# Patient Record
Sex: Female | Born: 1960 | Race: Black or African American | Hispanic: No | Smoking: Former smoker
Health system: Southern US, Community
[De-identification: ages and names within clinical notes are randomized; demographics above are authoritative.]

## PROBLEM LIST (undated history)

## (undated) DIAGNOSIS — I10 Essential (primary) hypertension: Secondary | ICD-10-CM

## (undated) DIAGNOSIS — E785 Hyperlipidemia, unspecified: Secondary | ICD-10-CM

## (undated) DIAGNOSIS — Z800 Family history of malignant neoplasm of digestive organs: Secondary | ICD-10-CM

## (undated) DIAGNOSIS — E119 Type 2 diabetes mellitus without complications: Secondary | ICD-10-CM

## (undated) DIAGNOSIS — R42 Dizziness and giddiness: Secondary | ICD-10-CM

## (undated) DIAGNOSIS — G971 Other reaction to spinal and lumbar puncture: Secondary | ICD-10-CM

## (undated) DIAGNOSIS — T466X5A Adverse effect of antihyperlipidemic and antiarteriosclerotic drugs, initial encounter: Secondary | ICD-10-CM

## (undated) DIAGNOSIS — E039 Hypothyroidism, unspecified: Secondary | ICD-10-CM

## (undated) DIAGNOSIS — I5030 Unspecified diastolic (congestive) heart failure: Secondary | ICD-10-CM

## (undated) DIAGNOSIS — M791 Myalgia, unspecified site: Principal | ICD-10-CM

## (undated) DIAGNOSIS — K5790 Diverticulosis of intestine, part unspecified, without perforation or abscess without bleeding: Secondary | ICD-10-CM

## (undated) DIAGNOSIS — R590 Localized enlarged lymph nodes: Secondary | ICD-10-CM

## (undated) DIAGNOSIS — Z860100 Personal history of colon polyps, unspecified: Secondary | ICD-10-CM

## (undated) DIAGNOSIS — I2510 Atherosclerotic heart disease of native coronary artery without angina pectoris: Secondary | ICD-10-CM

## (undated) DIAGNOSIS — N6310 Unspecified lump in the right breast, unspecified quadrant: Secondary | ICD-10-CM

## (undated) HISTORY — PX: CHOLECYSTECTOMY: SHX55

## (undated) HISTORY — DX: Diverticulosis of intestine, part unspecified, without perforation or abscess without bleeding: K57.90

## (undated) HISTORY — DX: Type 2 diabetes mellitus without complications: E11.9

## (undated) HISTORY — DX: Dizziness and giddiness: R42

## (undated) HISTORY — DX: Morbid (severe) obesity due to excess calories: E66.01

## (undated) HISTORY — PX: ABDOMINAL HYSTERECTOMY: SHX81

## (undated) HISTORY — DX: Adverse effect of antihyperlipidemic and antiarteriosclerotic drugs, initial encounter: T46.6X5A

## (undated) HISTORY — DX: Essential (primary) hypertension: I10

## (undated) HISTORY — DX: Personal history of colon polyps, unspecified: Z86.0100

## (undated) HISTORY — DX: Hypothyroidism, unspecified: E03.9

## (undated) HISTORY — PX: CARDIAC CATHETERIZATION: SHX172

## (undated) HISTORY — DX: Family history of malignant neoplasm of digestive organs: Z80.0

## (undated) HISTORY — DX: Myalgia, unspecified site: M79.10

## (undated) HISTORY — DX: Personal history of colonic polyps: Z86.010

## (undated) HISTORY — DX: Atherosclerotic heart disease of native coronary artery without angina pectoris: I25.10

---
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
## 2007-05-14 ENCOUNTER — Observation Stay: Payer: Self-pay | Admitting: Internal Medicine

## 2007-05-14 ENCOUNTER — Other Ambulatory Visit: Payer: Self-pay

## 2007-06-25 ENCOUNTER — Ambulatory Visit: Payer: Self-pay | Admitting: Otolaryngology

## 2010-01-09 HISTORY — PX: OTHER SURGICAL HISTORY: SHX169

## 2011-01-10 DIAGNOSIS — I214 Non-ST elevation (NSTEMI) myocardial infarction: Secondary | ICD-10-CM

## 2011-01-10 HISTORY — DX: Non-ST elevation (NSTEMI) myocardial infarction: I21.4

## 2011-01-19 ENCOUNTER — Emergency Department: Payer: Self-pay | Admitting: Emergency Medicine

## 2011-01-19 LAB — BASIC METABOLIC PANEL
Anion Gap: 9 (ref 7–16)
BUN: 11 mg/dL (ref 7–18)
Calcium, Total: 8.6 mg/dL (ref 8.5–10.1)
Chloride: 106 mmol/L (ref 98–107)
EGFR (African American): >60
EGFR (Non-African Amer.): >60
Glucose: 183 mg/dL — ABNORMAL HIGH (ref 65–99)
Osmolality: 287 (ref 275–301)
Potassium: 3.3 mmol/L — ABNORMAL LOW (ref 3.5–5.1)
Sodium: 142 mmol/L (ref 136–145)

## 2011-01-19 LAB — CBC
HCT: 42.8 % (ref 35.0–47.0)
MCHC: 33.9 g/dL (ref 32.0–36.0)
MCV: 92 fL (ref 80–100)
Platelet: 226 10*3/uL (ref 150–440)
RDW: 14 % (ref 11.5–14.5)
WBC: 6.6 10*3/uL (ref 3.6–11.0)

## 2011-01-19 LAB — TROPONIN I: Troponin-I: 0.14 ng/mL — ABNORMAL HIGH

## 2011-01-19 LAB — APTT: Activated PTT: 35.8 secs (ref 23.6–35.9)

## 2011-01-19 LAB — PROTIME-INR: INR: 1

## 2011-02-07 DIAGNOSIS — E0590 Thyrotoxicosis, unspecified without thyrotoxic crisis or storm: Secondary | ICD-10-CM | POA: Insufficient documentation

## 2011-02-13 DIAGNOSIS — E785 Hyperlipidemia, unspecified: Secondary | ICD-10-CM | POA: Insufficient documentation

## 2011-02-13 DIAGNOSIS — I4891 Unspecified atrial fibrillation: Secondary | ICD-10-CM

## 2011-02-13 DIAGNOSIS — Z87891 Personal history of nicotine dependence: Secondary | ICD-10-CM | POA: Insufficient documentation

## 2011-02-13 DIAGNOSIS — F17200 Nicotine dependence, unspecified, uncomplicated: Secondary | ICD-10-CM | POA: Insufficient documentation

## 2011-02-13 HISTORY — DX: Unspecified atrial fibrillation: I48.91

## 2011-03-15 ENCOUNTER — Encounter: Payer: Self-pay | Admitting: Internal Medicine

## 2011-04-10 ENCOUNTER — Encounter: Payer: Self-pay | Admitting: Internal Medicine

## 2011-05-10 ENCOUNTER — Encounter: Payer: Self-pay | Admitting: Internal Medicine

## 2011-08-25 DIAGNOSIS — H9190 Unspecified hearing loss, unspecified ear: Secondary | ICD-10-CM

## 2011-08-25 HISTORY — DX: Unspecified hearing loss, unspecified ear: H91.90

## 2012-12-10 ENCOUNTER — Ambulatory Visit: Payer: Self-pay | Admitting: Gastroenterology

## 2013-01-10 ENCOUNTER — Ambulatory Visit: Payer: Self-pay | Admitting: Family Medicine

## 2013-11-06 LAB — TSH: TSH: 10.8 u[IU]/mL — AB (ref 0.41–5.90)

## 2013-12-02 LAB — BASIC METABOLIC PANEL
BUN: 26 mg/dL — AB (ref 4–21)
Creatinine: 1.2 mg/dL — AB (ref 0.5–1.1)
GLUCOSE: 428 mg/dL
Sodium: 133 mmol/L — AB (ref 137–147)

## 2013-12-10 ENCOUNTER — Encounter: Payer: Self-pay | Admitting: *Deleted

## 2013-12-18 ENCOUNTER — Other Ambulatory Visit: Payer: Self-pay | Admitting: *Deleted

## 2013-12-18 ENCOUNTER — Ambulatory Visit (INDEPENDENT_AMBULATORY_CARE_PROVIDER_SITE_OTHER): Payer: BC Managed Care – PPO | Admitting: Endocrinology

## 2013-12-18 ENCOUNTER — Encounter: Payer: Self-pay | Admitting: Endocrinology

## 2013-12-18 ENCOUNTER — Encounter: Payer: Self-pay | Admitting: *Deleted

## 2013-12-18 VITALS — BP 122/68 | HR 64 | Temp 98.7°F | Resp 16 | Wt 247.1 lb

## 2013-12-18 DIAGNOSIS — E119 Type 2 diabetes mellitus without complications: Secondary | ICD-10-CM

## 2013-12-18 DIAGNOSIS — I10 Essential (primary) hypertension: Secondary | ICD-10-CM | POA: Insufficient documentation

## 2013-12-18 DIAGNOSIS — E039 Hypothyroidism, unspecified: Secondary | ICD-10-CM

## 2013-12-18 DIAGNOSIS — E669 Obesity, unspecified: Secondary | ICD-10-CM

## 2013-12-18 DIAGNOSIS — E1169 Type 2 diabetes mellitus with other specified complication: Secondary | ICD-10-CM | POA: Insufficient documentation

## 2013-12-18 DIAGNOSIS — E785 Hyperlipidemia, unspecified: Secondary | ICD-10-CM

## 2013-12-18 LAB — LIPID PANEL
CHOL/HDL RATIO: 5
Cholesterol: 167 mg/dL (ref 0–200)
HDL: 31.6 mg/dL — ABNORMAL LOW (ref >39.00)
LDL CALC: 116 mg/dL — AB (ref 0–99)
NonHDL: 135.4
Triglycerides: 97 mg/dL (ref 0.0–149.0)
VLDL: 19.4 mg/dL (ref 0.0–40.0)

## 2013-12-18 LAB — T4, FREE: FREE T4: 1.04 ng/dL (ref 0.60–1.60)

## 2013-12-18 LAB — COMPREHENSIVE METABOLIC PANEL
ALBUMIN: 3.7 g/dL (ref 3.5–5.2)
ALK PHOS: 72 U/L (ref 39–117)
ALT: 22 U/L (ref 0–35)
AST: 33 U/L (ref 0–37)
BILIRUBIN TOTAL: 0.6 mg/dL (ref 0.2–1.2)
BUN: 9 mg/dL (ref 6–23)
CO2: 23 mEq/L (ref 19–32)
Calcium: 8.6 mg/dL (ref 8.4–10.5)
Chloride: 100 mEq/L (ref 96–112)
Creatinine, Ser: 0.8 mg/dL (ref 0.4–1.2)
GFR: 92.47 mL/min (ref >60.00)
GLUCOSE: 186 mg/dL — AB (ref 70–99)
Potassium: 3.2 mEq/L — ABNORMAL LOW (ref 3.5–5.1)
Sodium: 132 mEq/L — ABNORMAL LOW (ref 135–145)
Total Protein: 6.9 g/dL (ref 6.0–8.3)

## 2013-12-18 LAB — HM DIABETES FOOT EXAM: HM Diabetic Foot Exam: NORMAL

## 2013-12-18 LAB — TSH: TSH: 3.6 u[IU]/mL (ref 0.35–4.50)

## 2013-12-18 LAB — MICROALBUMIN / CREATININE URINE RATIO
Creatinine,U: 109.3 mg/dL
MICROALB UR: 0.3 mg/dL (ref 0.0–1.9)
Microalb Creat Ratio: 0.3 mg/g (ref 0.0–30.0)

## 2013-12-18 MED ORDER — INSULIN LISPRO 100 UNIT/ML (KWIKPEN): PEN_INJECTOR | SUBCUTANEOUS | Status: DC

## 2013-12-18 MED ORDER — INSULIN PEN NEEDLE 32G X 4 MM MISC: Status: DC

## 2013-12-18 MED ORDER — INSULIN GLARGINE 300 UNIT/ML ~~LOC~~ SOPN: 14.0000 [IU] | PEN_INJECTOR | Freq: Every day | SUBCUTANEOUS | Status: DC

## 2013-12-18 NOTE — Progress Notes (Signed)
Reason for visit-  Elizabeth Russo is a 53 y.o.-year-old female, referred by her PCP,  Dr Enid Derry, for management of Type 2 diabetes, uncontrolled, with complications (? Stage 3 CKD versus AKI secondary to hyperglycemia). associated hx CAD s/p MI .   HPI- Patient has been diagnosed with diabetes in November 2015 after having preceding IFG. She has been on dietary modifications till recently. Sister passed away October 23, 2013 and this has been stressful. Has preceding hx steroid injection in shoulder around August 2015. Was tried briefly on oral agent for about 4 days recently in November, but given elevated sugars in setting of ketonuria was transitioned to basal insulin about 2 weeks ago and also started on bolus insulin 1 week ago.     Pt is currently on a regimen of: - Toujeo 10 units [place] daily ( dose decreased from 35 units daily to 20 then 10 units yesterday) - Humalog approximately 4+1 scale  For FS> 200 with BF and supper    Last hemoglobin A1c was: No results found for: HGBA1C By report from clinic notes was 12.8 % Nov 2015  Pt checks her sugars 2-3 a day . Uses  One Touch ultra 2 glucometer. By sugar log they are:  PREMEAL Breakfast Lunch Dinner Bedtime Overall  Glucose range: 180-281  140-283    Mean/median:        POST-MEAL PC Breakfast PC Lunch PC Dinner  Glucose range:     Mean/median:       Hypoglycemia-  No lows. Lowest sugar was n/a;   Dietary habits- eats 2- three times daily. Tries to limit carbs, sweetened beverages, sodas, desserts. Increased vegetable intake recently. Cancelled DM education appointment made for her by PCP at [facility] due to unclear reason.  Exercise- no formal exercise Weight -  Wt Readings from Last 3 Encounters:  12/18/13 247 lb 1.9 oz (112.093 kg)    Diabetes Complications-  Nephropathy- ? Yes Stage 3  CKD versus dehydration in setting of uncontrolled sugars, last BUN/creatinine- GFR 50-60  Lab Results  Component Value Date   BUN 9  12/18/2013   CREATININE 0.8 12/18/2013    Retinopathy- No, Last DEE was in 2014 Neuropathy- no numbness and tingling in her feet. No known neuropathy.  Associated history -Has CAD s/p MI and stent 2013 . No prior stroke. Has hypothyroidism. her last TSH was  Lab Results  Component Value Date   TSH 3.60 12/18/2013    Taking levothyroxine 112 mcg daily. Last TSH not controlled. FH of hypothyroidism in mother. ROS as below.   Hyperlipidemia-  her last set of lipids were- Currently on dietary therapy. Tolerating well. Recalls not tolerating Crestor in 2015 due to muscle aches.  Lab Results  Component Value Date   CHOL 167 12/18/2013   HDL 31.60* 12/18/2013   LDLCALC 116* 12/18/2013   TRIG 97.0 12/18/2013   CHOLHDL 5 12/18/2013    Blood Pressure/HTN- Patient's blood pressure is well controlled today on current regimen that includes ACE-I.  Pt has FH of DM in parents and sister.  I have reviewed the patient's past medical history, family and social history, surgical history, medications and allergies.  Past Medical History  Diagnosis Date  . Diabetes mellitus without complication   . Hypertension   . Hypothyroid   . Diverticulosis   . CAD (coronary artery disease)    Past Surgical History  Procedure Laterality Date  . Abdominal hysterectomy     Family History  Problem Relation Age of  Onset  . Heart disease Mother   . Diabetes Mother   . Hypertension Mother   . Hyperlipidemia Mother   . Thyroid disease Mother   . Cancer Father     colon  . Diabetes Father   . Hypertension Father   . Diabetes Sister   . Hypertension Sister   . Heart disease Sister   . Hyperlipidemia Sister   . Kidney disease Sister   . Cancer Maternal Grandfather     lung   History   Social History  . Marital Status: Married    Spouse Name: N/A    Number of Children: N/A  . Years of Education: N/A   Occupational History  . Not on file.   Social History Main Topics  . Smoking status:  Former Research scientist (life sciences)  . Smokeless tobacco: Not on file  . Alcohol Use: Not on file  . Drug Use: Not on file  . Sexual Activity: Not on file   Other Topics Concern  . Not on file   Social History Narrative   Current Outpatient Prescriptions on File Prior to Visit  Medication Sig Dispense Refill  . amLODipine (NORVASC) 5 MG tablet Take 5 mg by mouth daily.    . carvedilol (COREG) 12.5 MG tablet Take 12.5 mg by mouth 2 (two) times daily with a meal.    . clopidogrel (PLAVIX) 75 MG tablet Take 75 mg by mouth daily.    . hydrochlorothiazide (HYDRODIURIL) 25 MG tablet Take 25 mg by mouth daily.    Marland Kitchen levothyroxine (SYNTHROID, LEVOTHROID) 112 MCG tablet Take 112 mcg by mouth daily before breakfast.    . lisinopril (PRINIVIL,ZESTRIL) 40 MG tablet Take 40 mg by mouth 2 (two) times daily.     No current facility-administered medications on file prior to visit.   Allergies no known allergies   Review of Systems: [x]  complains of  [  ] denies General:   [  ] Recent weight change [ x ] Fatigue  [ x ] Loss of appetite Eyes: [ x ]  Vision Difficulty [  ]  Eye pain ENT: [x  ]  Hearing difficulty [  ]  Difficulty Swallowing CVS: [  ] Chest pain [ x ]  Palpitations/Irregular Heart beat [  ]  Shortness of breath lying flat [  ] Swelling of legs Resp: [  ] Frequent Cough [ x ] Shortness of Breath  [  ]  Wheezing GI: [  ] Heartburn  [  ] Nausea or Vomiting  [  ] Diarrhea [  ] Constipation  [  ] Abdominal Pain GU: [  ]  Polyuria  [  ]  nocturia Bones/joints:  [  ]  Muscle aches  [  ] Joint Pain  [  ] Bone pain Skin/Hair/Nails: [ x ]  Rash  [  ] New stretch marks [ x ]  Itching [  ] Hair loss [  ]  Excessive hair growth Reproduction: [  ] Low sexual desire , [  ]  Women: Menstrual cycle problems [  ]  Women: Breast Discharge [  ] Men: Difficulty with erections [  ]  Men: Enlarged Breasts CNS: [  ] Frequent Headaches [ x ] Blurry vision [  ] Tremors [  ] Seizures [  ] Loss of consciousness [  ] Localized  weakness Endocrine: [x  ]  Excess thirst [  ]  Feeling excessively hot [  ]  Feeling excessively cold Heme: [  ]  Easy bruising [  ]  Enlarged glands or lumps in neck Allergy: [  ]  Food allergies [  ] Environmental allergies  PE: BP 122/68 mmHg  Pulse 64  Temp(Src) 98.7 F (37.1 C) (Oral)  Resp 16  Wt 247 lb 1.9 oz (112.093 kg)  SpO2 96%  LMP  (LMP Unknown) Wt Readings from Last 3 Encounters:  12/18/13 247 lb 1.9 oz (112.093 kg)   GENERAL: No acute distress, well developed HEENT:  Eye exam shows normal external appearance. Oral exam shows normal mucosa .  NECK:   Neck exam shows no lymphadenopathy. No Carotids bruits. Thyroid is  enlarged and nodularity is felt bilaterally.  + acanthosis nigricans LUNGS:         Chest is symmetrical. Lungs are clear to auscultation.Marland Kitchen   HEART:         Heart sounds:  S1 and S2 are normal. No murmurs or clicks heard. ABDOMEN:  No Distention present. Liver and spleen are not palpable. No other mass or tenderness present.  EXTREMITIES:     There is no edema. 2+ DP pulses  NEUROLOGICAL:     Grossly intact.            Diabetic foot exam done with shoes and socks removed: Normal Monofilament testing bilaterally. No deformities of toes.  Nails  Not dystrophic. Skin normal color. No open wounds. Dry skin. Calluses+ MUSCULOSKELETAL:       There is no enlargement or gross deformity of the joints.  SKIN:       No rash  ASSESSMENT AND PLAN: Problem List Items Addressed This Visit      Cardiovascular and Mediastinum   Essential hypertension, benign    BP at target. Update urine MA.     Relevant Orders      Comprehensive metabolic panel (Completed)      Microalbumin / creatinine urine ratio (Completed)     Endocrine   Type 2 diabetes mellitus not at goal - Primary    Discussed that her A1c is very elevated, along with her sugars.   Discussed diet and exercise. She is willing to continue with her dietary efforts and also will call to set up DM education  appointment at the start of next year.   Discussed home glucose monitoring and she will start checking sugars 4x daily.   Discussed need for basal/bolus insulin and will continue her on Toujeo and Humalog for now.   Change Toujeo to 14 units once daily.  Change Humalog to 6+1 scale qac. She will use half dose scale for lunch time, provided she is eating.  Discussed foot care, eye exams, hypoglycemia recognition and treatment.   RTC 1 week. Update labs today.    Relevant Orders      Comprehensive metabolic panel (Completed)      Microalbumin / creatinine urine ratio (Completed)      Lipid panel (Completed)   Hypothyroidism    Clinically euthyroid today. Update thyroid tests. She has a palpable goiter with nodules possibly. Denies compressive symptoms. Will readdress at next visits and order thyroid ultrasound at that time.     Relevant Orders      TSH (Completed)      T4, free (Completed)     Other   Hyperlipidemia    Update liver and lipid panel for screening. Will assess statin use at next visits.          - Return to clinic in 1 week with sugar log/meter.  Marishka Rentfrow  [facility] 12/19/2013 12:23 PM

## 2013-12-18 NOTE — Patient Instructions (Addendum)
Check sugars 4 x daily ( before each meal and at bedtime).  Record them in a log book and bring that/meter to next appointment.   Change Toujeo to 14 units daily at bedtime.  Change Humalog scale as follows-   Blood Glucose (mg/dL)  Breakfast  (Units Humalog Insulin)  Lunch  (Units Humalog Insulin)  Supper  (Units Humalog Insulin)  Nighttime (Units Humalog Insulin)   <70 Treat the low blood sugar.  Recheck blood glucose  in 15 mins. If sugar is more than 70, then take the number of units of insulin in the 70-90 row, if before a meal.   70-90   4   4   4   0  91-130 (Base Dose)  6  6  6   0   131-150  7  7  7   0   151-200  8  8  8   0   201-250  9  9  9  0   251-300  10  10  10 1    301-350  11  11  11 2    351-400  12  12  12  3    401-450  13  13  13 4    >450  14  14  14  5    Toujeo insulin 14 units subcutaneous Nightly     Please come back for a follow-up appointment in 1 weeks [place] half dose scale for lunch provided you are eating a meal.  Labs today. Call back if start to notice lo blood sugars.

## 2013-12-18 NOTE — Progress Notes (Signed)
Pre visit review using our clinic review tool, if applicable. No additional management support is needed unless otherwise documented below in the visit note. 

## 2013-12-19 DIAGNOSIS — E785 Hyperlipidemia, unspecified: Secondary | ICD-10-CM | POA: Insufficient documentation

## 2013-12-19 DIAGNOSIS — I2510 Atherosclerotic heart disease of native coronary artery without angina pectoris: Secondary | ICD-10-CM | POA: Insufficient documentation

## 2013-12-19 NOTE — Assessment & Plan Note (Signed)
Clinically euthyroid today. Update thyroid tests. She has a palpable goiter with nodules possibly. Denies compressive symptoms. Will readdress at next visits and order thyroid ultrasound at that time.

## 2013-12-19 NOTE — Assessment & Plan Note (Signed)
BP at target. Update urine MA.

## 2013-12-19 NOTE — Assessment & Plan Note (Signed)
Discussed that her A1c is very elevated, along with her sugars.   Discussed diet and exercise. She is willing to continue with her dietary efforts and also will call to set up DM education appointment at the start of next year.   Discussed home glucose monitoring and she will start checking sugars 4x daily.   Discussed need for basal/bolus insulin and will continue her on Toujeo and Humalog for now.   Change Toujeo to 14 units once daily.  Change Humalog to 6+1 scale qac. She will use half dose scale for lunch time, provided she is eating.  Discussed foot care, eye exams, hypoglycemia recognition and treatment.   RTC 1 week. Update labs today.

## 2013-12-19 NOTE — Assessment & Plan Note (Signed)
Update liver and lipid panel for screening. Will assess statin use at next visits.

## 2013-12-24 ENCOUNTER — Ambulatory Visit: Payer: BC Managed Care – PPO | Admitting: Endocrinology

## 2013-12-30 ENCOUNTER — Telehealth: Payer: Self-pay

## 2013-12-30 NOTE — Telephone Encounter (Signed)
Received a Prior authorization request for Humalog Pen. Pharmacist at [facility] called today and asked if the PA is denied would it be okay to change isulin to Novalog? Pharmacist knows that insurance will cover Novalog. Please advise.

## 2013-12-31 NOTE — Telephone Encounter (Signed)
Pharmacist was notified that it is ok to D/C humalog and start Novolog at this time.

## 2013-12-31 NOTE — Telephone Encounter (Signed)
Pharmacist at [facility] called today and asked if the prior authorization is denied would it be okay to change insulin to Novalog? Pharmacist knows that insurance will cover Novalog. Please advise as Dr. Howell Rucks is out of the office.

## 2013-12-31 NOTE — Telephone Encounter (Signed)
Humalog can be changed to NovoLog

## 2014-03-19 ENCOUNTER — Ambulatory Visit (INDEPENDENT_AMBULATORY_CARE_PROVIDER_SITE_OTHER): Payer: BC Managed Care – PPO | Admitting: Endocrinology

## 2014-03-19 VITALS — BP 126/80 | HR 57 | Temp 98.2°F | Resp 16 | Ht 64.0 in | Wt 245.8 lb

## 2014-03-19 DIAGNOSIS — E049 Nontoxic goiter, unspecified: Secondary | ICD-10-CM

## 2014-03-19 DIAGNOSIS — I10 Essential (primary) hypertension: Secondary | ICD-10-CM

## 2014-03-19 DIAGNOSIS — E785 Hyperlipidemia, unspecified: Secondary | ICD-10-CM

## 2014-03-19 DIAGNOSIS — E039 Hypothyroidism, unspecified: Secondary | ICD-10-CM

## 2014-03-19 DIAGNOSIS — E119 Type 2 diabetes mellitus without complications: Secondary | ICD-10-CM

## 2014-03-19 HISTORY — DX: Nontoxic goiter, unspecified: E04.9

## 2014-03-19 NOTE — Assessment & Plan Note (Signed)
Recent LDL not at goal. Encourage lifestyle for now, and readdress statin use at next visits

## 2014-03-19 NOTE — Patient Instructions (Addendum)
Check sugars 3 times daily ( fasting and premeal readings at alternating times, or at bedtime).  Record them in a sugar log and bring log and meter to next appointment.   Change Toujeo to 16 units daily.  Continue current Novolog dosing.  Please try to eat lunch daily instead of snacking through the day. Take full dose Novolog if eating a meal at lunch time. If snacking at lunch time, then take half dose of Novolog scale.   Please come back for a follow-up appointment in 4 weeks

## 2014-03-19 NOTE — Progress Notes (Signed)
Reason for visit-  Elizabeth Russo is a 54 y.o.-year-old female,  for management of Type 2 diabetes, uncontrolled, without complications . associated hx CAD s/p MI . Last visit Dec 2015.   HPI- Patient has been diagnosed with diabetes in November 2015 after having preceding IFG. She has been on dietary modifications till recently. Sister passed away 2013/11/20 and this has been stressful. Has preceding hx steroid injection in shoulder around August 2015. Was tried briefly on oral agent for about 4 days recently in November, but given elevated sugars in setting of ketonuria was transitioned to basal insulin Dec 2015  and also started on bolus insulin 1 week after.   *had to reschedule last appointment due to husband's passing   Pt is currently on a regimen of: - Toujeo 14 units [place] daily  - Novolog at 6+1 scale  with BF and supper , supposed to take half dose for lunch but not doing the injection at lunch time   Last hemoglobin A1c was: No results found for: HGBA1C By report from clinic notes was 12.8 % Nov 2015  Pt checks her sugars 2 a day . Uses  One Touch ultra 2 glucometer. By sugar log they are:  PREMEAL Breakfast Lunch Dinner Bedtime Overall  Glucose range: 115-126 131 76-178 mostly    Mean/median:        POST-MEAL PC Breakfast PC Lunch PC Dinner  Glucose range:     Mean/median:       Hypoglycemia-  No lows. Lowest sugar was n/a;   Dietary habits- eats 2- three times daily. Tries to limit carbs, sweetened beverages, sodas, desserts. Increased vegetable intake recently. Cancelled DM education appointment made for her by PCP at [facility] due to unclear reason- feels that she knows what to do.  Exercise- trying to walk -every other day- 3mile Weight -  Wt Readings from Last 3 Encounters:  03/19/14 245 lb 12.8 oz (111.494 kg)  12/18/13 247 lb 1.9 oz (112.093 kg)    Diabetes Complications-  Nephropathy- No CKD, last BUN/creatinine- GFR 50-60 previously in setting of high  sugars Lab Results  Component Value Date   BUN 9 12/18/2013   CREATININE 0.8 12/18/2013   Lab Results  Component Value Date   GFR 92.47 12/18/2013   Lab Results  Component Value Date   MICRALBCREAT 0.3 12/18/2013    Retinopathy- No, Last DEE was in 2014 Neuropathy- no numbness and tingling in her feet. No known neuropathy.  Associated history -Has CAD s/p MI and stent 2013 . No prior stroke. Has hypothyroidism. her last TSH was  Lab Results  Component Value Date   TSH 3.60 12/18/2013    Taking levothyroxine 112 mcg daily. Last TSH not controlled. FH of hypothyroidism in mother. ROS as below. No FH thyroid cancer. Denies any compressive symptoms.  Hyperlipidemia-  her last set of lipids were- Currently on dietary therapy. Tolerating well. Recalls not tolerating Crestor in 2015 due to muscle aches.  Lab Results  Component Value Date   CHOL 167 12/18/2013   HDL 31.60* 12/18/2013   LDLCALC 116* 12/18/2013   TRIG 97.0 12/18/2013   CHOLHDL 5 12/18/2013    Blood Pressure/HTN- Patient's blood pressure is well controlled today on current regimen that includes ACE-I.   I have reviewed the patient's past medical history,  medications and allergies.   Current Outpatient Prescriptions on File Prior to Visit  Medication Sig Dispense Refill  . amLODipine (NORVASC) 5 MG tablet Take 5 mg by mouth  daily.    . carvedilol (COREG) 12.5 MG tablet Take 12.5 mg by mouth 2 (two) times daily with a meal.    . clopidogrel (PLAVIX) 75 MG tablet Take 75 mg by mouth daily.    . hydrochlorothiazide (HYDRODIURIL) 25 MG tablet Take 25 mg by mouth daily.    . Insulin Glargine (TOUJEO SOLOSTAR) 300 UNIT/ML SOPN Inject 14 Units into the skin daily. 3 pen 3  . insulin lispro (HUMALOG KWIKPEN) 100 UNIT/ML KiwkPen Use on a sliding scale between 6-16 units [place] three times daily. Max daily dose 45 units. 15 mL 3  . Insulin Pen Needle (BD PEN NEEDLE NANO U/F) 32G X 4 MM MISC Use for insulin administration four  times daily. 120 each 11  . levothyroxine (SYNTHROID, LEVOTHROID) 112 MCG tablet Take 112 mcg by mouth daily before breakfast.    . lisinopril (PRINIVIL,ZESTRIL) 40 MG tablet Take 40 mg by mouth 2 (two) times daily.     No current facility-administered medications on file prior to visit.   Allergies no known allergies  Review of Systems- [ x ]  Complains of    [  ]  denies [  ] Recent weight change [  ]  Fatigue [  ] polydipsia [  ] polyuria [  ]  nocturia [  ]  vision difficulty [  ] chest pain [  ] shortness of breath [  ] leg swelling [  ] cough [  ] nausea/vomiting [  ] diarrhea [  ] constipation [  ] abdominal pain [  ]  tingling/numbness in extremities [  ]  concern with feet ( wounds/sores)   PE: BP 126/80 mmHg  Pulse 57  Temp(Src) 98.2 F (36.8 C) (Oral)  Resp 16  Ht 5\' 4"  (1.626 m)  Wt 245 lb 12.8 oz (111.494 kg)  BMI 42.17 kg/m2  SpO2 96%  LMP  (LMP Unknown) Wt Readings from Last 3 Encounters:  03/19/14 245 lb 12.8 oz (111.494 kg)  12/18/13 247 lb 1.9 oz (112.093 kg)   GENERAL: No acute distress, well developed HEENT:  Eye exam shows normal external appearance. Oral exam shows normal mucosa .  NECK:   Neck exam shows no lymphadenopathy. Thyroid is  enlarged and nodularity is felt bilaterally.     ASSESSMENT AND PLAN: Problem List Items Addressed This Visit      Cardiovascular and Mediastinum   Essential hypertension, benign    BP at goal today. Recent urine MA normal Dec 2015.         Endocrine   Type 2 diabetes mellitus not at goal - Primary    Recent A1c elevated, but sugars are getting better on current regimen.  She is yet not wanting to set up DM education appt. Going to try manage this on her own.   Continue checking sugars 3x daily.   Change Toujeo to 16 units daily.  Continue Novolog at 6+1 scale qac. She will use half dose scale for lunch time, provided she is eating a snack otherwise is going to work on using the full scale at lunch time if she  has a meal.  Reminded her to get eye appointment.  At next visits, she would like to try coming off the insulin and this might be possible, if her sugars are still at target.   RTC 1 month        Hypothyroidism    Clinically euthyroid today. Recent thyroid levels normal. She has a palpable goiter with nodules  possibly. Denies compressive symptoms. Discussed about thyroid nodules, possible pathology and follow up. Order thyroid ultrasound at this time.         Relevant Orders   US Soft Tissue Head/Neck   Goiter    Palpable goiter with some nodularity. Discussed thyroid nodules and possible pathology, follow up. Ordered thyroid US for further evaluation.       Relevant Orders   US Soft Tissue Head/Neck     Other   Hyperlipidemia    Recent LDL not at goal. Encourage lifestyle for now, and readdress statin use at next visits           - Return to clinic in 1 month with sugar log/meter.  Elizabeth Russo [facility] 03/19/2014 8:53 AM

## 2014-03-19 NOTE — Assessment & Plan Note (Signed)
Palpable goiter with some nodularity. Discussed thyroid nodules and possible pathology, follow up. Ordered thyroid US for further evaluation.

## 2014-03-19 NOTE — Assessment & Plan Note (Signed)
Recent A1c elevated, but sugars are getting better on current regimen.  She is yet not wanting to set up DM education appt. Going to try manage this on her own.   Continue checking sugars 3x daily.   Change Toujeo to 16 units daily.  Continue Novolog at 6+1 scale qac. She will use half dose scale for lunch time, provided she is eating a snack otherwise is going to work on using the full scale at lunch time if she has a meal.  Reminded her to get eye appointment.  At next visits, she would like to try coming off the insulin and this might be possible, if her sugars are still at target.   RTC 1 month

## 2014-03-19 NOTE — Assessment & Plan Note (Signed)
Clinically euthyroid today. Recent thyroid levels normal. She has a palpable goiter with nodules possibly. Denies compressive symptoms. Discussed about thyroid nodules, possible pathology and follow up. Order thyroid ultrasound at this time.

## 2014-03-19 NOTE — Assessment & Plan Note (Signed)
BP at goal today. Recent urine MA normal Dec 2015.

## 2014-03-19 NOTE — Progress Notes (Signed)
Pre visit review using our clinic review tool, if applicable. No additional management support is needed unless otherwise documented below in the visit note. 

## 2014-03-31 ENCOUNTER — Other Ambulatory Visit: Payer: BC Managed Care – PPO

## 2014-04-06 ENCOUNTER — Encounter: Payer: Self-pay | Admitting: *Deleted

## 2014-04-16 ENCOUNTER — Ambulatory Visit: Payer: BC Managed Care – PPO | Admitting: Endocrinology

## 2014-04-16 DIAGNOSIS — Z0289 Encounter for other administrative examinations: Secondary | ICD-10-CM

## 2014-04-22 ENCOUNTER — Ambulatory Visit (INDEPENDENT_AMBULATORY_CARE_PROVIDER_SITE_OTHER): Payer: BC Managed Care – PPO | Admitting: General Surgery

## 2014-04-22 ENCOUNTER — Encounter: Payer: Self-pay | Admitting: General Surgery

## 2014-04-22 VITALS — BP 142/70 | HR 72 | Resp 14 | Ht 64.0 in | Wt 244.0 lb

## 2014-04-22 DIAGNOSIS — N63 Unspecified lump in breast: Secondary | ICD-10-CM

## 2014-04-22 DIAGNOSIS — N6310 Unspecified lump in the right breast, unspecified quadrant: Secondary | ICD-10-CM

## 2014-04-22 NOTE — Patient Instructions (Signed)
1 week nurse  

## 2014-04-22 NOTE — Progress Notes (Signed)
Patient ID: [name], female   DOB: [birth year], 54 y.o.   MRN: [id number]  Chief Complaint  Patient presents with  . Other    right niplle redness    HPI Elizabeth Russo is a 54 y.o. female here today for a evaluation from her right nipple. Patient states her right nipple will intermittently become red and swollen . Patient states it been going on for about three years now. She states that the flare up has only happened about 2 to 3 times. When the area is inflared she states it only last for about a week. She states she also has a knot on right nipple the size on a pea.    There is no history of nipple/oral contact.  Mammogram was done on 04/09/14 at [facility].   The patient is accompanied by her daughter, Elizabeth Russo, who was present for the interview and exam. HPI  Past Medical History  Diagnosis Date  . Diabetes mellitus without complication   . Hypertension   . Hypothyroid   . Diverticulosis   . CAD (coronary artery disease)   . Vertigo     Past Surgical History  Procedure Laterality Date  . Abdominal hysterectomy    . Heart stent  2012    Family History  Problem Relation Age of Onset  . Heart disease Mother   . Diabetes Mother   . Hypertension Mother   . Hyperlipidemia Mother   . Thyroid disease Mother   . Cancer Father     colon  . Diabetes Father   . Hypertension Father   . Diabetes Sister   . Hypertension Sister   . Heart disease Sister   . Hyperlipidemia Sister   . Kidney disease Sister   . Cancer Maternal Grandfather     lung    Social History History  Substance Use Topics  . Smoking status: Former Research scientist (life sciences)  . Smokeless tobacco: Not on file  . Alcohol Use: 0.0 oz/week    0 Standard drinks or equivalent per week    Allergies no known allergies  Current Outpatient Prescriptions  Medication Sig Dispense Refill  . amLODipine (NORVASC) 5 MG tablet Take 5 mg by mouth daily.    Marland Kitchen aspirin EC 81 MG tablet Take by mouth.    Marland Kitchen atorvastatin (LIPITOR)  10 MG tablet Take 10 mg by mouth daily at 6 PM.    . clopidogrel (PLAVIX) 75 MG tablet Take 75 mg by mouth daily.    . hydrochlorothiazide (HYDRODIURIL) 25 MG tablet Take 25 mg by mouth daily.    . Insulin Glargine (TOUJEO SOLOSTAR) 300 UNIT/ML SOPN Inject 14 Units into the skin daily. 3 pen 3  . Insulin Pen Needle (BD PEN NEEDLE NANO U/F) 32G X 4 MM MISC Use for insulin administration four times daily. 120 each 11  . levothyroxine (SYNTHROID, LEVOTHROID) 112 MCG tablet Take 112 mcg by mouth daily before breakfast.    . lisinopril (PRINIVIL,ZESTRIL) 40 MG tablet Take 40 mg by mouth 2 (two) times daily.    Marland Kitchen NOVOLOG FLEXPEN 100 UNIT/ML FlexPen     . ONE TOUCH ULTRA TEST test strip     . insulin lispro (HUMALOG KWIKPEN) 100 UNIT/ML KiwkPen Use on a sliding scale between 6-16 units [place] three times daily. Max daily dose 45 units. (Patient not taking: Reported on 04/22/2014) 15 mL 3   No current facility-administered medications for this visit.    Review of Systems Review of Systems  Constitutional: Negative.  Respiratory: Negative.   Cardiovascular: Negative.     Blood pressure 142/70, pulse 72, resp. rate 14, height 5\' 4"  (1.626 m), weight 244 lb (110.678 kg).  Physical Exam Physical Exam  Constitutional: She is oriented to person, place, and time. She appears well-developed and well-nourished.  Eyes: Conjunctivae are normal. No scleral icterus.  Neck: Neck supple.  Cardiovascular: Normal rate, regular rhythm and normal heart sounds.   Pulmonary/Chest: Effort normal and breath sounds normal. Deformity:  3 mm nodular at 5 o'clock 1 cm from the base of the nipple. Right breast exhibits mass. Right breast exhibits no inverted nipple, no nipple discharge, no skin change and no tenderness. Left breast exhibits no inverted nipple, no mass, no nipple discharge, no skin change and no tenderness.  Lymphadenopathy:    She has no cervical adenopathy.  Neurological: She is alert and oriented to  person, place, and time.  Skin: Skin is warm and dry.    Data Reviewed Bilateral mammograms dated 04/08/2014 completed at [facility] were independently reviewed. BI-RADS-1.  Assessment    Rumel nodule within the right areolar skin.  No evidence of primary pathology involving the right nipple.    Plan    It was elected to remove the nodular area. This was completed using 5 mL of 0.5% Xylocaine with 0.25% Marcaine with 1-200,000 units epinephrine. Graft the nodular area was excised and sent for routine histology. This was about 3 mm in diameter. The skin defect was closed with interrupted 5-0 nylon sutures. A dry dressing with Telfa and Tegaderm was applied.  Postoperative wound care was reviewed. The patient will return in one week for suture removal.  Should her nipple redness recur, she was encouraged to call promptly for early assessment to help better determine the cause.     PCP:  Fleet Contras 04/25/2014, 10:56 AM

## 2014-04-25 DIAGNOSIS — N6310 Unspecified lump in the right breast, unspecified quadrant: Secondary | ICD-10-CM | POA: Insufficient documentation

## 2014-04-25 HISTORY — DX: Unspecified lump in the right breast, unspecified quadrant: N63.10

## 2014-04-27 ENCOUNTER — Other Ambulatory Visit: Payer: Self-pay | Admitting: *Deleted

## 2014-04-27 ENCOUNTER — Telehealth: Payer: Self-pay

## 2014-04-27 MED ORDER — ONETOUCH ULTRA BLUE VI STRP: ORAL_STRIP | Status: DC

## 2014-04-27 NOTE — Telephone Encounter (Signed)
Notified patient as instructed, patient pleased. Discussed follow-up appointments, patient agrees  

## 2014-04-27 NOTE — Telephone Encounter (Signed)
Pt called, needing refill on test strips to [facility]. Rx sent to pharmacy by escript

## 2014-04-27 NOTE — Telephone Encounter (Signed)
-----   Message from Robert Bellow, MD sent at 04/27/2014  2:35 PM EDT ----- Please notify the patient the biopsy results were entirely benign. Office follow-up for wound check as previously scheduled. Remind her to call promptly if she develops another episode of redness in the nipple. We'll try to arrange for a prompt exam.  ----- Message -----    From: [facility]    Sent: 04/24/2014  10:53 AM      To: Robert Bellow, MD

## 2014-04-29 ENCOUNTER — Ambulatory Visit (INDEPENDENT_AMBULATORY_CARE_PROVIDER_SITE_OTHER): Payer: BC Managed Care – PPO

## 2014-04-29 DIAGNOSIS — N63 Unspecified lump in breast: Secondary | ICD-10-CM

## 2014-04-29 DIAGNOSIS — N6310 Unspecified lump in the right breast, unspecified quadrant: Secondary | ICD-10-CM

## 2014-04-29 NOTE — Patient Instructions (Signed)
Follow up as needed

## 2014-04-29 NOTE — Progress Notes (Signed)
Patient ID: Elizabeth Russo, female   DOB: 1960/12/21, 54 y.o.   MRN: [id number]  Patient came in today for a wound check. The wound is clean, with no signs of infection noted. Two sutures removed, skin intact. Patient aware of her pathology result. Follow up as needed.

## 2014-05-12 ENCOUNTER — Ambulatory Visit
Admission: RE | Admit: 2014-05-12 | Discharge: 2014-05-12 | Disposition: A | Discharge status: Home / Self Care | Payer: BC Managed Care – PPO | Source: Ambulatory Visit | Attending: Endocrinology | Admitting: Endocrinology

## 2014-05-12 DIAGNOSIS — E049 Nontoxic goiter, unspecified: Secondary | ICD-10-CM

## 2014-05-12 DIAGNOSIS — E042 Nontoxic multinodular goiter: Secondary | ICD-10-CM | POA: Diagnosis not present

## 2014-05-12 DIAGNOSIS — E039 Hypothyroidism, unspecified: Secondary | ICD-10-CM

## 2014-05-24 DIAGNOSIS — I5042 Chronic combined systolic (congestive) and diastolic (congestive) heart failure: Secondary | ICD-10-CM | POA: Insufficient documentation

## 2014-06-09 ENCOUNTER — Encounter: Payer: Self-pay | Admitting: General Surgery

## 2014-06-26 ENCOUNTER — Ambulatory Visit (INDEPENDENT_AMBULATORY_CARE_PROVIDER_SITE_OTHER): Payer: BC Managed Care – PPO | Admitting: Family Medicine

## 2014-06-26 ENCOUNTER — Encounter: Payer: Self-pay | Admitting: Family Medicine

## 2014-06-26 VITALS — BP 115/77 | HR 58 | Temp 98.6°F | Ht 64.5 in | Wt 245.0 lb

## 2014-06-26 DIAGNOSIS — E785 Hyperlipidemia, unspecified: Secondary | ICD-10-CM

## 2014-06-26 DIAGNOSIS — M109 Gout, unspecified: Secondary | ICD-10-CM

## 2014-06-26 DIAGNOSIS — E119 Type 2 diabetes mellitus without complications: Secondary | ICD-10-CM

## 2014-06-26 DIAGNOSIS — M10071 Idiopathic gout, right ankle and foot: Secondary | ICD-10-CM | POA: Diagnosis not present

## 2014-06-26 DIAGNOSIS — E039 Hypothyroidism, unspecified: Secondary | ICD-10-CM | POA: Diagnosis not present

## 2014-06-26 DIAGNOSIS — I10 Essential (primary) hypertension: Secondary | ICD-10-CM

## 2014-06-26 HISTORY — DX: Morbid (severe) obesity due to excess calories: E66.01

## 2014-06-26 MED ORDER — INDOMETHACIN 50 MG PO CAPS: 50.0000 mg | ORAL_CAPSULE | Freq: Three times a day (TID) | ORAL | Status: DC

## 2014-06-26 MED ORDER — TRAMADOL HCL 50 MG PO TABS: 50.0000 mg | ORAL_TABLET | Freq: Four times a day (QID) | ORAL | Status: DC | PRN

## 2014-06-26 NOTE — Assessment & Plan Note (Signed)
Encouraged weight loss; walking when she gets over the foot discomfort

## 2014-06-26 NOTE — Assessment & Plan Note (Signed)
Reviewed last two lipid panels in [facility], much improvement with statin; avoid / limit saturated fats

## 2014-06-26 NOTE — Assessment & Plan Note (Signed)
Last TSH in Feb was normal; recheck yearly

## 2014-06-26 NOTE — Assessment & Plan Note (Signed)
Today's BP is excellent; stopping the HCTZ which will likely cause BP to go up; pt will monitor and avoid salt, follow DASH guidelines; if BP goes up, then we'll increase the CCB

## 2014-06-26 NOTE — Progress Notes (Signed)
BP 115/77 mmHg  Pulse 58  Temp(Src) 98.6 F (37 C)  Ht 5' 4.5" (1.638 m)  Wt 245 lb (111.131 kg)  BMI 41.42 kg/m2  SpO2 98%  LMP  (LMP Unknown)   Subjective:    Patient ID: Elizabeth Russo, female    DOB: 1960-12-20, 54 y.o.   MRN: [id number]  HPI: Elizabeth Russo is a 54 y.o. female  Chief Complaint  Patient presents with  . Hyperlipidemia    she is not taking her cholesterol med  . Hypertension   She has a bad toe going on; thinks it is gout; she says it has been bothering her for three weeks; no injury, nothing similar in the past; father had gout; limited ROM; hurts with walking; she went to urgent care at [facility] and they put her on colchicine, but it didn't seem to help any; she has been eating more chicken; occasional alcohol, less than 7 drinks per week, and none since outbreak; ibuprofen stopped the pain but didn't make the swelling go away  Diabetes; managed by endo; ups and downs with sugars; last eye exam was over a year ago  HTN; she does not use salt shaker much, but does enjoy saturated fats; on HCTZ, CCB, and ACE-I  High cholesterol; no side effects from statin; does enjoy saturated fats unfortunately; LDL was over 130, started on statin, and LDL came down under 100  Relevant past medical, surgical, family and social history reviewed and updated as indicated. Interim medical history since our last visit reviewed. Allergies and medications reviewed and updated.  Review of Systems  Constitutional: Negative for fever.  Respiratory: Negative for cough.   Cardiovascular: Negative for chest pain.  Gastrointestinal: Negative for blood in stool.  Genitourinary: Negative for hematuria.   Per HPI unless specifically indicated above     Objective:    BP 115/77 mmHg  Pulse 58  Temp(Src) 98.6 F (37 C)  Ht 5' 4.5" (1.638 m)  Wt 245 lb (111.131 kg)  BMI 41.42 kg/m2  SpO2 98%  LMP  (LMP Unknown)  Wt Readings from Last 3 Encounters:  06/26/14 245 lb  (111.131 kg)  04/06/14 245 lb (111.131 kg)  04/22/14 244 lb (110.678 kg)    Physical Exam  Constitutional: She appears well-developed and well-nourished. No distress.  Morbidly obese  HENT:  Head: Normocephalic and atraumatic.  Eyes: EOM are normal. No scleral icterus.  Neck: No thyromegaly present.  Cardiovascular: Regular rhythm and normal heart sounds.  Bradycardia present.   No murmur heard. Borderline bradycardic rate  Pulmonary/Chest: Effort normal and breath sounds normal. No respiratory distress. She has no wheezes.  Abdominal: Soft. Bowel sounds are normal. She exhibits no distension.  Musculoskeletal: She exhibits no edema.       Right foot: There is decreased range of motion, tenderness, bony tenderness and swelling. There is normal capillary refill and no laceration.  Neurological: She is alert. She displays no atrophy and no tremor.  Intact to vibration and monofilament testing right great toe  Skin: Skin is warm and dry. She is not diaphoretic. No cyanosis. No pallor.  Psychiatric: She has a normal mood and affect. Her behavior is normal. Judgment and thought content normal.      Assessment & Plan:   Problem List Items Addressed This Visit      Cardiovascular and Mediastinum   Essential hypertension, benign    Today's BP is excellent; stopping the HCTZ which will likely cause BP to go up;  pt will monitor and avoid salt, follow DASH guidelines; if BP goes up, then we'll increase the CCB      Relevant Medications   carvedilol (COREG) 12.5 MG tablet     Endocrine   Type 2 diabetes mellitus not at goal    Managed by endo      Hypothyroidism    Last TSH in Feb was normal; recheck yearly      Relevant Medications   carvedilol (COREG) 12.5 MG tablet     Other   Hyperlipidemia    Reviewed last two lipid panels in [facility], much improvement with statin; avoid / limit saturated fats      Relevant Medications   carvedilol (COREG) 12.5 MG tablet    Morbid obesity    Encouraged weight loss; walking when she gets over the foot discomfort       Other Visit Diagnoses    Acute gout of right foot, unspecified cause    -  Primary    avoid foods rich in purines; use indomethacin and tramadol; she already did colchicine; let me know if not improving, possible pseudogout    Relevant Medications    indomethacin (INDOCIN) 50 MG capsule    traMADol (ULTRAM) 50 MG tablet       Meds ordered this encounter  Medications  . carvedilol (COREG) 12.5 MG tablet    Sig: Take 12.5 mg by mouth daily.   Marland Kitchen DISCONTD: HYDROcodone-acetaminophen (NORCO/VICODIN) 5-325 MG per tablet    Sig: Take by mouth.  . indomethacin (INDOCIN) 50 MG capsule    Sig: Take 1 capsule (50 mg total) by mouth 3 (three) times daily with meals. No ibuprofen or aleve with this    Dispense:  30 capsule    Refill:  0  . traMADol (ULTRAM) 50 MG tablet    Sig: Take 1 tablet (50 mg total) by mouth every 6 (six) hours as needed.    Dispense:  20 tablet    Refill:  0    Follow up plan: Return for 3-4 weeks with CMA for BP, end of Oct w/Dr. Sanda Klein.

## 2014-06-26 NOTE — Patient Instructions (Addendum)
STOP HCTZ It might be making the gout flare worse Monitor your blood pressure and if your top number is staying over 130, we'll have you increase the amlodipine from 5 mg daily to 10 mg daily; just call me for new prescription if needed Start the indomethacin for gout Do not take any extra ibuprofen or other NSAIDs while on indomethacin STOP hydrocodone You can use the tramadol if needed for breakthrough pain Avoid foods rich in purine If this isn't getting better, then let me know and we'll get labs and look at the xray from [place] Avoid saturated fats Aim for 6,000 steps a day or 1.5 miles of walking Check out the information at familydoctor.org entitled "What It Takes to Lose Weight" Try to lose between 1-2 pounds per week by taking in fewer calories and burning off more calories You can succeed by limiting portions, limiting foods dense in calories and fat, becoming more active, and drinking 8 glasses of water a day Don't skip meals, especially breakfast, as skipping meals may alter your metabolism Do not use over-the-counter weight loss pills or gimmicks that claim rapid weight loss A healthy BMI (or body mass index) is between 18.5 and 24.9 You can calculate your ideal BMI at the [place] website ClubMonetize.fr  Your goal blood pressure is less than  130 on top Try to follow the DASH guidelines (DASH stands for Dietary Approaches to Stop Hypertension) Try to limit the sodium in your diet.  Ideally, consume less than 1.5 grams (less than 1,500mg ) per day. Do not add salt when cooking or at the table.  Check the sodium amount on labels when shopping, and choose items lower in sodium when given a choice. Avoid or limit foods that already contain a lot of sodium. Eat a diet rich in fruits and vegetables and whole grains.  GINA -- please print out her last cholesterol panel from [facility]     [place] Eating Plan [place] stands for  "Dietary Approaches to Stop Hypertension." The DASH eating plan is a healthy eating plan that has been shown to reduce high blood pressure (hypertension). Additional health benefits may include reducing the risk of type 2 diabetes mellitus, heart disease, and stroke. The DASH eating plan may also help with weight loss. WHAT DO I NEED TO KNOW ABOUT THE DASH EATING PLAN? For the DASH eating plan, you will follow these general guidelines:  Choose foods with a percent daily value for sodium of less than 5% (as listed on the food label).  Use salt-free seasonings or herbs instead of table salt or sea salt.  Check with your health care provider or pharmacist before using salt substitutes.  Eat lower-sodium products, often labeled as "lower sodium" or "no salt added."  Eat fresh foods.  Eat more vegetables, fruits, and low-fat dairy products.  Choose whole grains. Look for the word "whole" as the first word in the ingredient list.  Choose fish and skinless chicken or [place] more often than red meat. Limit fish, poultry, and meat to 6 oz (170 g) each day.  Limit sweets, desserts, sugars, and sugary drinks.  Choose heart-healthy fats.  Limit cheese to 1 oz (28 g) per day.  Eat more home-cooked food and less restaurant, buffet, and fast food.  Limit fried foods.  Cook foods using methods other than frying.  Limit canned vegetables. If you do use them, rinse them well to decrease the sodium.  When eating at a restaurant, ask that your food be prepared with  less salt, or no salt if possible. WHAT FOODS CAN I EAT? Seek help from a dietitian for individual calorie needs. Grains Whole grain or whole wheat bread. Brown rice. Whole grain or whole wheat pasta. Quinoa, bulgur, and whole grain cereals. Low-sodium cereals. Corn or whole wheat flour tortillas. Whole grain cornbread. Whole grain crackers. Low-sodium crackers. Vegetables Fresh or frozen vegetables (raw, steamed, roasted, or grilled).  Low-sodium or reduced-sodium tomato and vegetable juices. Low-sodium or reduced-sodium tomato sauce and paste. Low-sodium or reduced-sodium canned vegetables.  Fruits All fresh, canned (in natural juice), or frozen fruits. Meat and Other Protein Products Ground beef (85% or leaner), grass-fed beef, or beef trimmed of fat. Skinless chicken or [place]. Ground chicken or [place]. Pork trimmed of fat. All fish and seafood. Eggs. Dried beans, peas, or lentils. Unsalted nuts and seeds. Unsalted canned beans. Dairy Low-fat dairy products, such as skim or 1% milk, 2% or reduced-fat cheeses, low-fat ricotta or cottage cheese, or plain low-fat yogurt. Low-sodium or reduced-sodium cheeses. Fats and Oils Tub margarines without trans fats. Light or reduced-fat mayonnaise and salad dressings (reduced sodium). Avocado. Safflower, olive, or canola oils. Natural peanut or almond butter. Other Unsalted popcorn and pretzels. The items listed above may not be a complete list of recommended foods or beverages. Contact your dietitian for more options. WHAT FOODS ARE NOT RECOMMENDED? Grains White bread. White pasta. White rice. Refined cornbread. Bagels and croissants. Crackers that contain trans fat. Vegetables Creamed or fried vegetables. Vegetables in a cheese sauce. Regular canned vegetables. Regular canned tomato sauce and paste. Regular tomato and vegetable juices. Fruits Dried fruits. Canned fruit in light or heavy syrup. Fruit juice. Meat and Other Protein Products Fatty cuts of meat. Ribs, chicken wings, bacon, sausage, bologna, salami, chitterlings, fatback, hot dogs, bratwurst, and packaged luncheon meats. Salted nuts and seeds. Canned beans with salt. Dairy Whole or 2% milk, cream, half-and-half, and cream cheese. Whole-fat or sweetened yogurt. Full-fat cheeses or blue cheese. Nondairy creamers and whipped toppings. Processed cheese, cheese spreads, or cheese curds. Condiments Onion and garlic salt,  seasoned salt, table salt, and sea salt. Canned and packaged gravies. Worcestershire sauce. Tartar sauce. Barbecue sauce. Teriyaki sauce. Soy sauce, including reduced sodium. Steak sauce. Fish sauce. Oyster sauce. Cocktail sauce. Horseradish. Ketchup and mustard. Meat flavorings and tenderizers. Bouillon cubes. Hot sauce. Tabasco sauce. Marinades. Taco seasonings. Relishes. Fats and Oils Butter, stick margarine, lard, shortening, ghee, and bacon fat. Coconut, palm kernel, or palm oils. Regular salad dressings. Other Pickles and olives. Salted popcorn and pretzels. The items listed above may not be a complete list of foods and beverages to avoid. Contact your dietitian for more information. WHERE CAN I FIND MORE INFORMATION? National Heart, Lung, and Blood Institute: travelstabloid.com Document Released: 12/15/2010 Document Revised: 05/12/2013 Document Reviewed: 10/30/2012 [facility] Patient Information 2015 [place], [place]. This information is not intended to replace advice given to you by your health care provider. Make sure you discuss any questions you have with your health care provider. Gout Gout is when your joints become red, sore, and swell (inflamed). This is caused by the buildup of uric acid crystals in the joints. Uric acid is a chemical that is normally in the blood. If the level of uric acid gets too high in the blood, these crystals form in your joints and tissues. Over time, these crystals can form into masses near the joints and tissues. These masses can destroy bone and cause the bone to look misshapen (deformed). HOME CARE   Do not  take aspirin for pain.  Only take medicine as told by your doctor.  Rest the joint as much as you can. When in bed, keep sheets and blankets off painful areas.  Keep the sore joints raised (elevated).  Put warm or cold packs on painful joints. Use of warm or cold packs depends on which works best for you.  Use  crutches if the painful joint is in your leg.  Drink enough fluids to keep your pee (urine) clear or pale yellow. Limit alcohol, sugary drinks, and drinks with fructose in them.  Follow your diet instructions. Pay careful attention to how much protein you eat. Include fruits, vegetables, whole grains, and fat-free or low-fat milk products in your daily diet. Talk to your doctor or dietitian about the use of coffee, vitamin C, and cherries. These may help lower uric acid levels.  Keep a healthy body weight. GET HELP RIGHT AWAY IF:   You have watery poop (diarrhea), throw up (vomit), or have any side effects from medicines.  You do not feel better in 24 hours, or you are getting worse.  Your joint becomes suddenly more tender, and you have chills or a fever. MAKE SURE YOU:   Understand these instructions.  Will watch your condition.  Will get help right away if you are not doing well or get worse. Document Released: 10/05/2007 Document Revised: 05/12/2013 Document Reviewed: 08/09/2011 [facility] Patient Information 2015 [place], [place]. This information is not intended to replace advice given to you by your health care provider. Make sure you discuss any questions you have with your health care provider. Gout Gout is when your joints become red, sore, and swell (inflamed). This is caused by the buildup of uric acid crystals in the joints. Uric acid is a chemical that is normally in the blood. If the level of uric acid gets too high in the blood, these crystals form in your joints and tissues. Over time, these crystals can form into masses near the joints and tissues. These masses can destroy bone and cause the bone to look misshapen (deformed). HOME CARE   Do not take aspirin for pain.  Only take medicine as told by your doctor.  Rest the joint as much as you can. When in bed, keep sheets and blankets off painful areas.  Keep the sore joints raised (elevated).  Put warm or cold packs  on painful joints. Use of warm or cold packs depends on which works best for you.  Use crutches if the painful joint is in your leg.  Drink enough fluids to keep your pee (urine) clear or pale yellow. Limit alcohol, sugary drinks, and drinks with fructose in them.  Follow your diet instructions. Pay careful attention to how much protein you eat. Include fruits, vegetables, whole grains, and fat-free or low-fat milk products in your daily diet. Talk to your doctor or dietitian about the use of coffee, vitamin C, and cherries. These may help lower uric acid levels.  Keep a healthy body weight. GET HELP RIGHT AWAY IF:   You have watery poop (diarrhea), throw up (vomit), or have any side effects from medicines.  You do not feel better in 24 hours, or you are getting worse.  Your joint becomes suddenly more tender, and you have chills or a fever. MAKE SURE YOU:   Understand these instructions.  Will watch your condition.  Will get help right away if you are not doing well or get worse. Document Released: 10/05/2007 Document Revised: 05/12/2013  Document Reviewed: 08/09/2011 [facility] Patient Information 2015 [place], [place]. This information is not intended to replace advice given to you by your health care provider. Make sure you discuss any questions you have with your health care provider.

## 2014-06-26 NOTE — Assessment & Plan Note (Signed)
Managed by endo

## 2014-07-01 ENCOUNTER — Telehealth: Payer: Self-pay | Admitting: Family Medicine

## 2014-07-01 NOTE — Telephone Encounter (Signed)
Left message for patient to call.

## 2014-07-01 NOTE — Telephone Encounter (Signed)
No, I don't want her to go back on the HCTZ; this likely means that she is getting too much sodium (salt) in her diet; have her really watch her intake, wear compression stockings if swelling in legs; elevate feet above hips if seated for a while, and get regular exercise

## 2014-07-01 NOTE — Telephone Encounter (Signed)
Patient notified. She states that she went ahead and took a fluid pill and feels a lot better. I advised her not to take them as it could be dangerous, to try the other suggestions you had made. If no improvement, call us back.

## 2014-07-01 NOTE — Telephone Encounter (Signed)
Routing to provider  

## 2014-07-01 NOTE — Telephone Encounter (Signed)
Pt called and said that she is retaining a lot of fluid and was unsure if she could/shoudl start back taking her fluid medication. Pt would like a call back at [phone]

## 2014-07-21 ENCOUNTER — Telehealth: Payer: Self-pay | Admitting: Family Medicine

## 2014-07-21 MED ORDER — INDOMETHACIN 50 MG PO CAPS: 50.0000 mg | ORAL_CAPSULE | Freq: Three times a day (TID) | ORAL | Status: DC

## 2014-07-21 NOTE — Telephone Encounter (Signed)
Pt called she believes her gout is flaring up again. Wants to know if something can be called in for her. Pharm is [facility]. Thanks.

## 2014-07-21 NOTE — Telephone Encounter (Signed)
Routing to provider  

## 2014-07-24 ENCOUNTER — Ambulatory Visit (INDEPENDENT_AMBULATORY_CARE_PROVIDER_SITE_OTHER): Payer: BC Managed Care – PPO

## 2014-07-24 VITALS — BP 134/77 | HR 55 | Wt 250.0 lb

## 2014-07-24 DIAGNOSIS — I10 Essential (primary) hypertension: Secondary | ICD-10-CM | POA: Diagnosis not present

## 2014-07-24 NOTE — Progress Notes (Signed)
Patient comes in for BP check today. We had stopped her HCTZ  due to gout flare ups. Today her BP is 134/77. The only thing she still complains of is some issues with gout in her toe. Per Dr. Sanda Klein, continue all meds as directed, we can increase the Amlodipine if necessary. Keep a check on BP and if top number is over 140, call our office. Limit salt and work on weight loss.

## 2014-07-24 NOTE — Patient Instructions (Signed)
Patient to continue all meds as directed. Limit salt and work on weight loss. Check BP at work and if top number is over 140, call our office and we can increase the Amlodipine.

## 2014-08-03 ENCOUNTER — Telehealth: Payer: Self-pay | Admitting: Family Medicine

## 2014-08-03 NOTE — Telephone Encounter (Signed)
Pt called stated Dr. Sanda Klein instructed her to call if pt's BP went above a cetain range. Pt stated that today her BP registered @ 148/78. Please call pt ASAP @ [phone]. Thanks.

## 2014-08-03 NOTE — Telephone Encounter (Signed)
I called number given, no answer I reached her on mobile She has gout again in her knee, now on the left side Advised her which foods to limit She took a pain pill, did not work a whole lot Having gout flare and taking indomethacin; that might be cause of mild elevation Avoid salt; she just eats the food as it is, some saltier than normal Okay to take extra half or whole pill of the BP medicine if needed  10 mg amlodipine is the max Next gout flare consider colchicine

## 2014-08-19 ENCOUNTER — Encounter: Payer: Self-pay | Admitting: Family Medicine

## 2014-08-19 ENCOUNTER — Telehealth: Payer: Self-pay | Admitting: Family Medicine

## 2014-08-19 ENCOUNTER — Ambulatory Visit (INDEPENDENT_AMBULATORY_CARE_PROVIDER_SITE_OTHER): Payer: BC Managed Care – PPO | Admitting: Family Medicine

## 2014-08-19 VITALS — BP 122/82 | HR 58 | Temp 98.2°F | Wt 243.0 lb

## 2014-08-19 DIAGNOSIS — I10 Essential (primary) hypertension: Secondary | ICD-10-CM | POA: Diagnosis not present

## 2014-08-19 DIAGNOSIS — M10061 Idiopathic gout, right knee: Secondary | ICD-10-CM | POA: Diagnosis not present

## 2014-08-19 DIAGNOSIS — M109 Gout, unspecified: Secondary | ICD-10-CM | POA: Insufficient documentation

## 2014-08-19 MED ORDER — COLCHICINE 0.6 MG PO TABS: ORAL_TABLET | ORAL | Status: DC

## 2014-08-19 MED ORDER — HYDROCODONE-ACETAMINOPHEN 5-325 MG PO TABS
1.0000 | ORAL_TABLET | Freq: Four times a day (QID) | ORAL | Status: DC | PRN
Start: 2014-08-19 — End: 2014-10-23

## 2014-08-19 NOTE — Patient Instructions (Addendum)
Don't take your cholesterol medicine (atorvastatin) for 3 days Start the colchicine now Avoid foods with purines We'll contact you about the labs Stay off of HCTZ Call me with an update on Monday We'll consider starting allopurinol to help prevent future attacks GINA --> please write her out of work today and tomorrow

## 2014-08-19 NOTE — Telephone Encounter (Signed)
I accidentally hit close encounter instead of routing to provider.

## 2014-08-19 NOTE — Telephone Encounter (Signed)
I talked with patient; first attack was a month ago; she went to walk-in clinic at [facility] [place]; they did not do blood work; just [employer]

## 2014-08-19 NOTE — Telephone Encounter (Signed)
She started back on the HCTZ after she got over her last attack; I told her that might be part of the problem and to STOP that She'll come in today at 1:45 pm for an appt and labs (uric acid, BMP, CBC) and we'll evaluate and get meds started this afternoon ----------------------- Colletta [place] -- please put patient on my schedule for today at 1:45 pm; she is aware; reason: knee pain

## 2014-08-19 NOTE — Telephone Encounter (Signed)
Elizabeth Russo called and stated her gout has come back and wants to know if Dr. Sanda Klein can send in a medication refill. Please call pt back on her cell phone number [phone], thanks.

## 2014-08-19 NOTE — Telephone Encounter (Signed)
Routing to provider  

## 2014-08-20 ENCOUNTER — Telehealth: Payer: Self-pay | Admitting: Family Medicine

## 2014-08-20 LAB — CBC WITH DIFFERENTIAL/PLATELET
BASOS: 1 %
Basophils Absolute: 0 10*3/uL (ref 0.0–0.2)
EOS (ABSOLUTE): 0.1 10*3/uL (ref 0.0–0.4)
EOS: 2 %
Hematocrit: 42.3 % (ref 34.0–46.6)
Hemoglobin: 13.7 g/dL (ref 11.1–15.9)
Immature Grans (Abs): 0 10*3/uL (ref 0.0–0.1)
Immature Granulocytes: 0 %
LYMPHS ABS: 2.4 10*3/uL (ref 0.7–3.1)
Lymphs: 44 %
MCH: 28.1 pg (ref 26.6–33.0)
MCHC: 32.4 g/dL (ref 31.5–35.7)
MCV: 87 fL (ref 79–97)
MONOCYTES: 9 %
MONOS ABS: 0.5 10*3/uL (ref 0.1–0.9)
Neutrophils Absolute: 2.4 10*3/uL (ref 1.4–7.0)
Neutrophils: 44 %
Platelets: 244 10*3/uL (ref 150–379)
RBC: 4.87 x10E6/uL (ref 3.77–5.28)
RDW: 13.5 % (ref 12.3–15.4)
WBC: 5.5 10*3/uL (ref 3.4–10.8)

## 2014-08-20 LAB — BASIC METABOLIC PANEL
BUN/Creatinine Ratio: 13 (ref 9–23)
BUN: 12 mg/dL (ref 6–24)
CHLORIDE: 95 mmol/L — AB (ref 97–108)
CO2: 28 mmol/L (ref 18–29)
Calcium: 9.5 mg/dL (ref 8.7–10.2)
Creatinine, Ser: 0.9 mg/dL (ref 0.57–1.00)
GFR calc Af Amer: 84 mL/min/{1.73_m2} (ref >59)
GFR calc non Af Amer: 73 mL/min/{1.73_m2} (ref >59)
GLUCOSE: 149 mg/dL — AB (ref 65–99)
POTASSIUM: 3.6 mmol/L (ref 3.5–5.2)
Sodium: 138 mmol/L (ref 134–144)

## 2014-08-20 LAB — URIC ACID: Uric Acid: 8.1 mg/dL — ABNORMAL HIGH (ref 2.5–7.1)

## 2014-08-20 NOTE — Telephone Encounter (Signed)
Patient notified. She is doing better.

## 2014-08-20 NOTE — Telephone Encounter (Signed)
Let her know that her uric acid was indeed high (that's what builds up in gout) I hope the new medicine is starting to work well for her Same instructions as before

## 2014-08-21 NOTE — Assessment & Plan Note (Signed)
Positive family hx; clinically consistent with gout; will check uric acid; start colchicine; avoid purine-rich foods; check creatinine to see if reduced GFR contributing; hydrocodone if needed for pain; out of work note provided; call if not improving

## 2014-08-21 NOTE — Assessment & Plan Note (Signed)
Stop the HCTZ; explained this can increase risk of gout attacks, contribute to elevated uric acid; monitor BP and let me know if climbing

## 2014-08-21 NOTE — Progress Notes (Signed)
BP 122/82 mmHg  Pulse 58  Temp(Src) 98.2 F (36.8 C)  Wt 243 lb (110.224 kg)  SpO2 99%  LMP  (LMP Unknown)   Subjective:    Patient ID: Elizabeth Russo, female    DOB: 1960/08/22, 54 y.o.   MRN: [id number]  HPI: Elizabeth Russo is a 54 y.o. female  Chief Complaint  Patient presents with  . Knee Pain    right knee pain, since yesterday morning. Wondering if it is a gout flare.   She is here for an acute visit; she and I spoke on the phone and she's here for evaluation and labs; she was diagnosed with gout in her ankle a month or so ago at an urgent care but no labs were done; now she has pain in her right knee; painful with bending it; she was unable to work today; she denies any fever; the knee has been warm and red; she has taken indomethacin in the past for the other gout flare; she does not recall any injury to the knee; pain is significant  She had been instructed earlier to stop her HCTZ with the last flare, but after the flare finished, she started back on the HCTZ  Fam hx: positive for gout  Relevant past medical, surgical, family and social history reviewed and updated as indicated. Interim medical history since our last visit reviewed. Allergies and medications reviewed and updated.  Review of Systems Per HPI unless specifically indicated above     Objective:    BP 122/82 mmHg  Pulse 58  Temp(Src) 98.2 F (36.8 C)  Wt 243 lb (110.224 kg)  SpO2 99%  LMP  (LMP Unknown)  Wt Readings from Last 3 Encounters:  08/19/14 243 lb (110.224 kg)  07/24/14 250 lb (113.399 kg)  06/26/14 245 lb (111.131 kg)    Physical Exam  Constitutional: She appears well-developed and well-nourished. She appears distressed (but in discomfort).  HENT:  Head: Normocephalic and atraumatic.  Eyes: No scleral icterus.  Cardiovascular: Bradycardia present.   Pulmonary/Chest: Effort normal.  Musculoskeletal:       Right knee: She exhibits decreased range of motion, swelling and erythema.  She exhibits no effusion, no ecchymosis, no deformity, no LCL laxity, normal patellar mobility and no MCL laxity. Tenderness found.  Right knee is warm to the touch; limited flexion and extension secondary to pain  Skin:  Skin over the right knee is erythematous, but no abrasion, no obvious injury, no bruising      Assessment & Plan:   Problem List Items Addressed This Visit      Cardiovascular and Mediastinum   Essential hypertension, benign    Stop the HCTZ; explained this can increase risk of gout attacks, contribute to elevated uric acid; monitor BP and let me know if climbing        Musculoskeletal and Integument   Acute gout of knee - Primary    Positive family hx; clinically consistent with gout; will check uric acid; start colchicine; avoid purine-rich foods; check creatinine to see if reduced GFR contributing; hydrocodone if needed for pain; out of work note provided; call if not improving      Relevant Medications   HYDROcodone-acetaminophen (NORCO/VICODIN) 5-325 MG per tablet   colchicine 0.6 MG tablet   Other Relevant Orders   Uric acid (Completed)   CBC with Differential/Platelet (Completed)   Basic Metabolic Panel (BMET) (Completed)      Follow up plan: Return if symptoms worsen or fail to improve.  Orders Placed This Encounter  Procedures  . Uric acid  . CBC with Differential/Platelet  . Basic Metabolic Panel (BMET)   Meds ordered this encounter  Medications  . HYDROcodone-acetaminophen (NORCO/VICODIN) 5-325 MG per tablet    Sig: Take 1 tablet by mouth every 6 (six) hours as needed for moderate pain.    Dispense:  16 tablet    Refill:  0  . colchicine 0.6 MG tablet    Sig: Take two pills now and then one pill one hour later    Dispense:  3 tablet    Refill:  1   An after-visit summary was printed and given to the patient at [facility].  Please see the patient instructions which may contain other information and recommendations beyond what is mentioned  above in the assessment and plan.

## 2014-08-31 ENCOUNTER — Other Ambulatory Visit: Payer: Self-pay | Admitting: Family Medicine

## 2014-08-31 MED ORDER — INDOMETHACIN 50 MG PO CAPS: 50.0000 mg | ORAL_CAPSULE | Freq: Three times a day (TID) | ORAL | Status: DC

## 2014-08-31 NOTE — Telephone Encounter (Signed)
Routing to provider  

## 2014-08-31 NOTE — Telephone Encounter (Signed)
Pt called stated she needs a refill on her gout medication. Pharm is [facility]. Thanks.

## 2014-09-01 ENCOUNTER — Telehealth: Payer: Self-pay | Admitting: Family Medicine

## 2014-09-01 NOTE — Telephone Encounter (Signed)
disregard

## 2014-10-01 ENCOUNTER — Telehealth: Payer: Self-pay | Admitting: Family Medicine

## 2014-10-01 DIAGNOSIS — M2550 Pain in unspecified joint: Secondary | ICD-10-CM | POA: Insufficient documentation

## 2014-10-01 MED ORDER — COLCHICINE 0.6 MG PO TABS: ORAL_TABLET | ORAL | Status: DC

## 2014-10-01 NOTE — Telephone Encounter (Signed)
Routing to provider  

## 2014-10-01 NOTE — Telephone Encounter (Signed)
If she is having this many flares of gout, I'm going to suggest she see a rheumatologist; this may be gout, but it could be pseudogout or some other diagnosis, so let's look into this; referral entered; she can go to [facility] if she'd like; let Tiffany know where she would like to go

## 2014-10-01 NOTE — Telephone Encounter (Signed)
Pt called stated she needs a refill on Indocin. Pharm is [facility]. Thanks.

## 2014-10-02 NOTE — Telephone Encounter (Signed)
Patient notified, she said she had no opinion any one she wanted to see.

## 2014-10-02 NOTE — Telephone Encounter (Signed)
Left message to call, I will fax her rx to pharmacy.

## 2014-10-02 NOTE — Telephone Encounter (Signed)
Pt states [facility] does not have her gout meds.

## 2014-10-23 ENCOUNTER — Encounter: Payer: Self-pay | Admitting: Family Medicine

## 2014-10-23 ENCOUNTER — Ambulatory Visit (INDEPENDENT_AMBULATORY_CARE_PROVIDER_SITE_OTHER): Payer: BC Managed Care – PPO | Admitting: Family Medicine

## 2014-10-23 VITALS — BP 126/80 | HR 59 | Temp 97.8°F | Ht 64.0 in | Wt 248.0 lb

## 2014-10-23 DIAGNOSIS — N63 Unspecified lump in breast: Secondary | ICD-10-CM

## 2014-10-23 DIAGNOSIS — M25562 Pain in left knee: Secondary | ICD-10-CM | POA: Diagnosis not present

## 2014-10-23 DIAGNOSIS — E119 Type 2 diabetes mellitus without complications: Secondary | ICD-10-CM | POA: Diagnosis not present

## 2014-10-23 DIAGNOSIS — R590 Localized enlarged lymph nodes: Secondary | ICD-10-CM

## 2014-10-23 DIAGNOSIS — R61 Generalized hyperhidrosis: Secondary | ICD-10-CM | POA: Insufficient documentation

## 2014-10-23 DIAGNOSIS — N6310 Unspecified lump in the right breast, unspecified quadrant: Secondary | ICD-10-CM

## 2014-10-23 DIAGNOSIS — I10 Essential (primary) hypertension: Secondary | ICD-10-CM | POA: Diagnosis not present

## 2014-10-23 DIAGNOSIS — M25561 Pain in right knee: Secondary | ICD-10-CM | POA: Insufficient documentation

## 2014-10-23 DIAGNOSIS — M2550 Pain in unspecified joint: Secondary | ICD-10-CM

## 2014-10-23 DIAGNOSIS — E039 Hypothyroidism, unspecified: Secondary | ICD-10-CM

## 2014-10-23 DIAGNOSIS — E785 Hyperlipidemia, unspecified: Secondary | ICD-10-CM | POA: Diagnosis not present

## 2014-10-23 HISTORY — DX: Localized enlarged lymph nodes: R59.0

## 2014-10-23 NOTE — Assessment & Plan Note (Signed)
Hard to lose weight; check TSH and free T4

## 2014-10-23 NOTE — Assessment & Plan Note (Signed)
Saw surgeon for this; removed and then returned; I recommend that she return to see the surgeon

## 2014-10-23 NOTE — Assessment & Plan Note (Signed)
Try to follow the DASH guidelines; try to lose weight

## 2014-10-23 NOTE — Assessment & Plan Note (Signed)
Multiple sites; will take a step back and rethink gout as the primary etiology; she never had a problem prior to a few months ago; she sees rheum in another month; I will be willing to prescribe her some pain medicine or other medicine as needed for her so her trip to [place] isn't ruined by joint pain (will be willing to do prednisone taper or hydrocodone if needed, but will see what labs show first)

## 2014-10-23 NOTE — Assessment & Plan Note (Signed)
Check A1C today, continue to see endo; foot exam by MD today

## 2014-10-23 NOTE — Assessment & Plan Note (Signed)
Check fasting lipids today; limit eggs and saturated fats

## 2014-10-23 NOTE — Progress Notes (Signed)
BP 126/80 mmHg  Pulse 59  Temp(Src) 97.8 F (36.6 C)  Ht 5\' 4"  (1.626 m)  Wt 248 lb (112.492 kg)  BMI 42.55 kg/m2  SpO2 98%  LMP  (LMP Unknown)   Subjective:    Patient ID: Elizabeth Russo, female    DOB: February 02, 1960, 54 y.o.   MRN: [id number]  HPI: [name] is a 54 y.o. female  Chief Complaint  Patient presents with  . Hypertension  . Hyperlipidemia    She is not taking her cholesterol med  . Hypothyroidism   She has high blood pressure; controlled today; she checks away from the doctor, last was maybe 140 over 75 or something like that; limits salt; tries to follow DASH guidelines  She had never had an issue with gout until about 3 months ago; she developed redness and pain in big toe; then she has had about 4 episodes since then; starting having issues in multiple joints; now having pain in both knees; knees are locking and popping; they get hot and swell; no xrays of the knees; did have xray of the toe at [facility] clinic when this first started happening; pain medicine helped, but didn't take it completely away  High cholesterol; tries to limit fats in her diet; eats 2-4 eggs per week; not taking medicine for cholesterol; has the medicine but doesn't take it  Hypothyroidism; taking thyroid medicine every day  Diabetes; doing well; sugars 88-140 range; on insulin; no lows  Relevant past medical, surgical, family and social history reviewed and updated as indicated. Interim medical history since our last visit reviewed. Allergies and medications reviewed and updated.  No known family hx of lymphoma; mother had thyroid trouble  Review of Systems  Constitutional: Positive for diaphoresis (not every night, going on for week). Negative for fever and fatigue.  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 Negative for dental problem (has been to the dentist, filling some teeth, just there last week), ear discharge, ear pain, mouth sores, nosebleeds, rhinorrhea, sinus pressure, trouble swallowing and voice change.   Eyes: Positive for visual disturbance (blurry vision, off and on for some time). Negative for pain and discharge.  Respiratory: Negative for shortness of breath and wheezing.   Cardiovascular: Positive for leg swelling (little bit in the ankles). Negative for chest pain.  Gastrointestinal: Negative for abdominal pain and abdominal distention.  Endocrine: Positive for cold intolerance. Negative for heat intolerance, polydipsia and polyuria.  Musculoskeletal: Positive for joint swelling and arthralgias.  Skin:       No change in moles  Allergic/Immunologic: Negative for environmental allergies.  Neurological: Positive for headaches (around the eye area, maybe in the top of the head too; within the last month). Negative for dizziness, tremors, speech difficulty, weakness, light-headedness and numbness.  Hematological: Positive for adenopathy (neck just recently with little sore throat). Does not bruise/bleed easily.  Psychiatric/Behavioral: Negative for confusion and decreased concentration.   Per HPI unless specifically indicated above     Objective:    BP 126/80 mmHg  Pulse 59  Temp(Src) 97.8 F (36.6 C)  Ht 5\' 4"  (1.626 m)  Wt 248 lb (112.492 kg)  BMI 42.55 kg/m2  SpO2 98%  LMP  (LMP Unknown)  Wt Readings from Last 3 Encounters:  10/23/14 248 lb (112.492 kg)  08/19/14 243 lb (110.224 kg)  07/24/14 250 lb (113.399 kg)    Physical Exam  Constitutional: She appears well-developed and well-nourished. No distress.  HENT:  Head: Normocephalic and atraumatic.  Eyes: EOM are normal. No scleral icterus.  Neck: No thyromegaly present.  Cardiovascular:  Normal rate, regular rhythm and normal heart sounds.   No murmur heard. Pulmonary/Chest: Effort normal and breath sounds normal. No respiratory distress. She has no wheezes.  Abdominal: Soft. Bowel sounds are normal. She exhibits no distension.  Musculoskeletal: She exhibits no edema.       Right knee: She exhibits decreased range of motion. She exhibits no swelling and no effusion. Tenderness found. Patellar tendon tenderness noted. No medial joint line and no lateral joint line tenderness noted.       Left knee: She exhibits decreased range of motion. She exhibits no swelling and no effusion. Tenderness found. Patellar tendon tenderness noted. No medial joint line and no lateral joint line tenderness noted.  Callus bottom of left foot, lateral aspect  Lymphadenopathy:       Head (right side): Submandibular adenopathy present. No submental, no preauricular, no posterior auricular and no occipital adenopathy present.       Head (left side): Submandibular adenopathy present. No submental, no preauricular, no posterior auricular and no occipital adenopathy present.    She has cervical adenopathy.       Right cervical: Superficial cervical adenopathy present.       Left cervical: Superficial cervical adenopathy present.       Right: No supraclavicular adenopathy present.       Left: No supraclavicular adenopathy present.  Very symmetric mild enlargement of the cervical and submandibular glands  Neurological: She is alert. She exhibits normal muscle tone.  Skin: Skin is warm and dry. She is not diaphoretic. No pallor.  Psychiatric: She has a normal mood and affect. Her behavior is normal. Judgment and thought content normal.   Diabetic Foot Form - Detailed   Diabetic Foot Exam - detailed  Diabetic Foot exam was performed with the following findings:  Yes 10/23/2014  9:20 AM  Visual Foot Exam completed.:  Yes  Is there a history of foot ulcer?:  No  Can the patient see the bottom of their feet?:   Yes  Is there swelling or and abnormal foot shape?:  No  Are the toenails long?:  No  Are the toenails thick?:  No  Is there elevated skin temparature?:  No  Are the toenails ingrown?:  No    Pulse Foot Exam completed.:  Yes  Right Dorsalis Pedis:  Present Left Dorsalis Pedis:  Present  Sensory Foot Exam Completed.:  Yes  Swelling:  No  Semmes-Weinstein Monofilament Test  R Site 1-Great Toe:  Pos L Site 1-Great Toe:  Pos  R Site 4:  Pos L Site 4:  Pos  R Site 5:  Pos L Site 5:  Pos        Results for orders placed or performed in visit on 08/19/14  Uric acid  Result Value Ref Range   Uric Acid 8.1 (H) 2.5 - 7.1 mg/dL  CBC with Differential/Platelet  Result Value Ref Range   WBC 5.5 3.4 - 10.8 x10E3/uL   RBC 4.87 3.77 - 5.28 x10E6/uL   Hemoglobin 13.7 11.1 - 15.9 g/dL   Hematocrit 42.3 34.0 - 46.6 %   MCV 87 79 -  97 fL   MCH 28.1 26.6 - 33.0 pg   MCHC 32.4 31.5 - 35.7 g/dL   RDW 13.5 12.3 - 15.4 %   Platelets 244 150 - 379 x10E3/uL   Neutrophils 44 %   Lymphs 44 %   Monocytes 9 %   Eos 2 %   Basos 1 %   Neutrophils Absolute 2.4 1.4 - 7.0 x10E3/uL   Lymphocytes Absolute 2.4 0.7 - 3.1 x10E3/uL   Monocytes Absolute 0.5 0.1 - 0.9 x10E3/uL   EOS (ABSOLUTE) 0.1 0.0 - 0.4 x10E3/uL   Basophils Absolute 0.0 0.0 - 0.2 x10E3/uL   Immature Granulocytes 0 %   Immature Grans (Abs) 0.0 0.0 - 0.1 G26R4/WN  Basic Metabolic Panel (BMET)  Result Value Ref Range   Glucose 149 (H) 65 - 99 mg/dL   BUN 12 6 - 24 mg/dL   Creatinine, Ser 0.90 0.57 - 1.00 mg/dL   GFR calc non Af Amer 73 >59 mL/min/1.73   GFR calc Af Amer 84 >59 mL/min/1.73   BUN/Creatinine Ratio 13 9 - 23   Sodium 138 134 - 144 mmol/L   Potassium 3.6 3.5 - 5.2 mmol/L   Chloride 95 (L) 97 - 108 mmol/L   CO2 28 18 - 29 mmol/L   Calcium 9.5 8.7 - 10.2 mg/dL      Assessment & Plan:   Problem List Items Addressed This Visit      Cardiovascular and Mediastinum   Essential hypertension, benign    Try to follow the  DASH guidelines; try to lose weight        Endocrine   Type 2 diabetes mellitus not at goal ([place])    Check A1C today, continue to see endo; foot exam by MD today      Relevant Orders   Hgb A1c w/o eAG   Lipid Panel w/o Chol/HDL Ratio   Hypothyroidism    Hard to lose weight; check TSH and free T4      Relevant Orders   TSH   T4, free     Immune and Lymphatic   Localized enlarged lymph nodes     Other   Hyperlipidemia    Check fasting lipids today; limit eggs and saturated fats      Relevant Orders   Lipid Panel w/o Chol/HDL Ratio   Breast mass, right    Saw surgeon for this; removed and then returned; I recommend that she return to see the surgeon      Joint pain    Multiple sites; will take a step back and rethink gout as the primary etiology; she never had a problem prior to a few months ago; she sees rheum in another month; I will be willing to prescribe her some pain medicine or other medicine as needed for her so her trip to [place] isn't ruined by joint pain (will be willing to do prednisone taper or hydrocodone if needed, but will see what labs show first)      Night sweats   Relevant Orders   Comprehensive metabolic panel   CBC with Differential/Platelet   LDH Isoenzyme   C-reactive protein   Knee pain, bilateral - Primary    Seeing rheumatologist in November; will get labs today      Relevant Orders   Uric acid   ANA w/Reflex if Positive      Follow up plan: Return in about 4 weeks (around 11/20/2014) for recheck of lymph nodes.  An after-visit summary was printed and given to  the patient at [facility].  Please see the patient instructions which may contain other information and recommendations beyond what is mentioned above in the assessment and plan.  Orders Placed This Encounter  Procedures  . Uric acid  . Hgb A1c w/o eAG  . ANA w/Reflex if Positive  . Comprehensive metabolic panel  . CBC with Differential/Platelet  . Lipid Panel w/o Chol/HDL  Ratio  . TSH  . T4, free  . LDH Isoenzyme  . C-reactive protein

## 2014-10-23 NOTE — Patient Instructions (Addendum)
Your goal blood pressure is less than 130 mmHg on top. Try to follow the DASH guidelines (DASH stands for Dietary Approaches to Stop Hypertension) Try to limit the sodium in your diet.  Ideally, consume less than 1.5 grams (less than 1,500mg ) per day. Do not add salt when cooking or at the table.  Check the sodium amount on labels when shopping, and choose items lower in sodium when given a choice. Avoid or limit foods that already contain a lot of sodium. Eat a diet rich in fruits and vegetables and whole grains.   Check out the information at familydoctor.org entitled "What It Takes to Lose Weight" Try to lose between 1-2 pounds per week by taking in fewer calories and burning off more calories You can succeed by limiting portions, limiting foods dense in calories and fat, becoming more active, and drinking 8 glasses of water a day (64 ounces) Don't skip meals, especially breakfast, as skipping meals may alter your metabolism Do not use over-the-counter weight loss pills or gimmicks that claim rapid weight loss A healthy BMI (or body mass index) is between 18.5 and 24.9 You can calculate your ideal BMI at the [place] website ClubMonetize[employer]fr  Limit eggs to no more than 3 per week; cut down on saturated fats  Please do call the surgeon to let him know about the place in your breast  Do return in 4 weeks for recheck of the lymph nodes  Do keep the appointment with the rheumologist

## 2014-10-23 NOTE — Assessment & Plan Note (Signed)
Seeing rheumatologist in November; will get labs today

## 2014-10-24 LAB — COMPREHENSIVE METABOLIC PANEL
ALK PHOS: 89 IU/L (ref 39–117)
ALT: 15 IU/L (ref 0–32)
AST: 19 IU/L (ref 0–40)
Albumin/Globulin Ratio: 1.4 (ref 1.1–2.5)
Albumin: 4.1 g/dL (ref 3.5–5.5)
BILIRUBIN TOTAL: 0.3 mg/dL (ref 0.0–1.2)
BUN / CREAT RATIO: 11 (ref 9–23)
BUN: 10 mg/dL (ref 6–24)
CHLORIDE: 102 mmol/L (ref 97–108)
CO2: 23 mmol/L (ref 18–29)
Calcium: 9.1 mg/dL (ref 8.7–10.2)
Creatinine, Ser: 0.88 mg/dL (ref 0.57–1.00)
GFR calc Af Amer: 87 mL/min/{1.73_m2} (ref >59)
GFR calc non Af Amer: 75 mL/min/{1.73_m2} (ref >59)
GLOBULIN, TOTAL: 2.9 g/dL (ref 1.5–4.5)
GLUCOSE: 98 mg/dL (ref 65–99)
POTASSIUM: 4.1 mmol/L (ref 3.5–5.2)
SODIUM: 140 mmol/L (ref 134–144)
Total Protein: 7 g/dL (ref 6.0–8.5)

## 2014-10-24 LAB — CBC WITH DIFFERENTIAL/PLATELET
BASOS ABS: 0 10*3/uL (ref 0.0–0.2)
Basos: 1 %
EOS (ABSOLUTE): 0.2 10*3/uL (ref 0.0–0.4)
Eos: 3 %
HEMATOCRIT: 41.4 % (ref 34.0–46.6)
Hemoglobin: 13.7 g/dL (ref 11.1–15.9)
Immature Grans (Abs): 0 10*3/uL (ref 0.0–0.1)
Immature Granulocytes: 0 %
LYMPHS ABS: 2.4 10*3/uL (ref 0.7–3.1)
Lymphs: 49 %
MCH: 28 pg (ref 26.6–33.0)
MCHC: 33.1 g/dL (ref 31.5–35.7)
MCV: 85 fL (ref 79–97)
MONOS ABS: 0.5 10*3/uL (ref 0.1–0.9)
Monocytes: 11 %
Neutrophils Absolute: 1.8 10*3/uL (ref 1.4–7.0)
Neutrophils: 36 %
Platelets: 222 10*3/uL (ref 150–379)
RBC: 4.9 x10E6/uL (ref 3.77–5.28)
RDW: 14 % (ref 12.3–15.4)
WBC: 4.9 10*3/uL (ref 3.4–10.8)

## 2014-10-24 LAB — ANA W/REFLEX IF POSITIVE: ANA: NEGATIVE

## 2014-10-24 LAB — LIPID PANEL W/O CHOL/HDL RATIO
CHOLESTEROL TOTAL: 189 mg/dL (ref 100–199)
HDL: 37 mg/dL — ABNORMAL LOW (ref >39)
LDL CALC: 133 mg/dL — AB (ref 0–99)
TRIGLYCERIDES: 93 mg/dL (ref 0–149)
VLDL Cholesterol Cal: 19 mg/dL (ref 5–40)

## 2014-10-24 LAB — URIC ACID: Uric Acid: 6.2 mg/dL (ref 2.5–7.1)

## 2014-10-24 LAB — LACTATE DEHYDROGENASE, ISOENZYMES
(LD) FRACTION 2: 34 % (ref 25–40)
(LD) FRACTION 3: 27 % (ref 17–27)
(LD) FRACTION 5: 14 % (ref 4–20)
(LD) Fraction 1: 14 % — ABNORMAL LOW (ref 17–32)
(LD) Fraction 4: 11 % (ref 5–13)

## 2014-10-24 LAB — C-REACTIVE PROTEIN: CRP: 9.3 mg/L — AB (ref 0.0–4.9)

## 2014-10-24 LAB — T4, FREE: Free T4: 1.39 ng/dL (ref 0.82–1.77)

## 2014-10-24 LAB — HGB A1C W/O EAG: HEMOGLOBIN A1C: 6.2 % — AB (ref 4.8–5.6)

## 2014-10-24 LAB — TSH: TSH: 3.5 u[IU]/mL (ref 0.450–4.500)

## 2014-10-29 ENCOUNTER — Telehealth: Payer: Self-pay | Admitting: Family Medicine

## 2014-10-29 NOTE — Telephone Encounter (Signed)
I talked with patient about her labs A1C is fantastic CRP is high; try anti-inflammatory diet She quit taking her statin, why LDL is high; epxlained went up, needs to be under 100; she can either go back on statin or really really try diet changes if she's averse to the statin Reviewed other labs

## 2014-11-01 ENCOUNTER — Other Ambulatory Visit: Payer: Self-pay | Admitting: Family Medicine

## 2014-11-02 MED ORDER — INDOMETHACIN 50 MG PO CAPS: 50.0000 mg | ORAL_CAPSULE | Freq: Three times a day (TID) | ORAL | Status: DC

## 2014-11-02 NOTE — Telephone Encounter (Signed)
Last uric acid only 6.2; awaiting rheum visit; may not be gout; will treat with NSAID

## 2014-12-09 ENCOUNTER — Ambulatory Visit: Payer: BC Managed Care – PPO | Admitting: Unknown Physician Specialty

## 2014-12-25 ENCOUNTER — Encounter: Payer: Self-pay | Admitting: Family Medicine

## 2014-12-25 ENCOUNTER — Ambulatory Visit (INDEPENDENT_AMBULATORY_CARE_PROVIDER_SITE_OTHER): Payer: BC Managed Care – PPO | Admitting: Family Medicine

## 2014-12-25 VITALS — BP 149/102 | HR 59 | Temp 97.2°F | Wt 256.0 lb

## 2014-12-25 DIAGNOSIS — E119 Type 2 diabetes mellitus without complications: Secondary | ICD-10-CM

## 2014-12-25 DIAGNOSIS — R1030 Lower abdominal pain, unspecified: Secondary | ICD-10-CM | POA: Diagnosis not present

## 2014-12-25 DIAGNOSIS — R3129 Other microscopic hematuria: Secondary | ICD-10-CM | POA: Insufficient documentation

## 2014-12-25 DIAGNOSIS — M545 Low back pain, unspecified: Secondary | ICD-10-CM | POA: Insufficient documentation

## 2014-12-25 DIAGNOSIS — R635 Abnormal weight gain: Secondary | ICD-10-CM | POA: Diagnosis not present

## 2014-12-25 DIAGNOSIS — M1A0690 Idiopathic chronic gout, unspecified knee, without tophus (tophi): Secondary | ICD-10-CM

## 2014-12-25 DIAGNOSIS — I10 Essential (primary) hypertension: Secondary | ICD-10-CM | POA: Diagnosis not present

## 2014-12-25 DIAGNOSIS — M109 Gout, unspecified: Secondary | ICD-10-CM | POA: Insufficient documentation

## 2014-12-25 DIAGNOSIS — E039 Hypothyroidism, unspecified: Secondary | ICD-10-CM | POA: Diagnosis not present

## 2014-12-25 HISTORY — DX: Low back pain, unspecified: M54.50

## 2014-12-25 LAB — UA/M W/RFLX CULTURE, ROUTINE
BILIRUBIN UA: NEGATIVE
GLUCOSE, UA: NEGATIVE
KETONES UA: NEGATIVE
LEUKOCYTES UA: NEGATIVE
Nitrite, UA: NEGATIVE
PROTEIN UA: NEGATIVE
SPEC GRAV UA: 1.02 (ref 1.005–1.030)
Urobilinogen, Ur: 0.2 mg/dL (ref 0.2–1.0)
pH, UA: 5 (ref 5.0–7.5)

## 2014-12-25 LAB — MICROALBUMIN, URINE WAIVED
Creatinine, Urine Waived: 100 mg/dL (ref 10–300)
Microalb, Ur Waived: 30 mg/L — ABNORMAL HIGH (ref 0–19)

## 2014-12-25 LAB — CBC WITH DIFFERENTIAL/PLATELET
HEMATOCRIT: 40.8 % (ref 34.0–46.6)
HEMOGLOBIN: 14.3 g/dL (ref 11.1–15.9)
Lymphocytes Absolute: 3 10*3/uL (ref 0.7–3.1)
Lymphs: 49 %
MCH: 30 pg (ref 26.6–33.0)
MCHC: 35 g/dL (ref 31.5–35.7)
MCV: 86 fL (ref 79–97)
MID (Absolute): 0.6 10*3/uL (ref 0.1–1.6)
MID: 10 %
Neutrophils Absolute: 2.6 10*3/uL (ref 1.4–7.0)
Neutrophils: 42 %
PLATELETS: 208 10*3/uL (ref 150–379)
RBC: 4.77 x10E6/uL (ref 3.77–5.28)
RDW: 14.6 % (ref 12.3–15.4)
WBC: 6.2 10*3/uL (ref 3.4–10.8)

## 2014-12-25 LAB — MICROSCOPIC EXAMINATION

## 2014-12-25 MED ORDER — AMLODIPINE BESYLATE 10 MG PO TABS: 10.0000 mg | ORAL_TABLET | Freq: Every day | ORAL | Status: DC

## 2014-12-25 NOTE — Assessment & Plan Note (Signed)
Weight gain 13 pounds over 4 months that patient does not think is explained by eating habits; check TSH

## 2014-12-25 NOTE — Patient Instructions (Addendum)
Try to use PLAIN allergy medicine without the decongestant Avoid: phenylephrine, phenylpropanolamine, and pseudoephredine If you need something for aches or pains, try to use Tylenol (acetaminphen) instead of non-steroidals (which include Aleve, ibuprofen, Advil, Motrin, and naproxen); non-steroidals can cause long-term kidney damage and raise blood pressure Increase your amlodipine from 5 mg to 10 mg daily We'll contact you with the lab results Check out the information at familydoctor.org entitled "What It Takes to Lose Weight" Try to lose between 1-2 pounds per week by taking in fewer calories and burning off more calories You can succeed by limiting portions, limiting foods dense in calories and fat, becoming more active, and drinking 8 glasses of water a day (64 ounces) Don't skip meals, especially breakfast, as skipping meals may alter your metabolism Do not use over-the-counter weight loss pills or gimmicks that claim rapid weight loss A healthy BMI (or body mass index) is between 18.5 and 24.9 You can calculate your ideal BMI at the [employer] website [employer]Monet[employer]fr Your goal blood pressure is less than 130 mmHg on top. Try to follow the DASH guidelines (DASH stands for Dietary Approaches to Stop Hypertension) Try to limit the sodium in your diet.  Ideally, consume less than 1.5 grams (less than 1,500mg ) per day. Do not add salt when cooking or at the table.  Check the sodium amount on labels when shopping, and choose items lower in sodium when given a choice. Avoid or limit foods that already contain a lot of sodium. Eat a diet rich in fruits and vegetables and whole grains.

## 2014-12-25 NOTE — Assessment & Plan Note (Signed)
Check creatinine and uric acid; I don't believe the rheumatologist would mind [place] collecting for him to review since we're drawing blood and he'll see her in five days

## 2014-12-25 NOTE — Assessment & Plan Note (Signed)
Trace on dip, 0-2 RBCs/hpf; will recheck in 3 weeks; discussed with patient

## 2014-12-25 NOTE — Assessment & Plan Note (Signed)
Seeing endocrinologist; last A1c 6.2; check urine microalbumin today;

## 2014-12-25 NOTE — Assessment & Plan Note (Signed)
See AVS; encouraged DASH guidelines; continue ACE-I and beta-blocker; increase CCB; return in 3 weeks

## 2014-12-25 NOTE — Assessment & Plan Note (Signed)
Suspect musculoskeletal; avoid NSAIDs; recheck urine in 3 weeks

## 2014-12-25 NOTE — Assessment & Plan Note (Signed)
Encouraged weight loss 

## 2014-12-25 NOTE — Progress Notes (Signed)
BP 149/102 mmHg  Pulse 59  Temp(Src) 97.2 F (36.2 C)  Wt 256 lb (116.121 kg)  SpO2 99%  LMP  (LMP Unknown)   Subjective:    Patient ID: Elizabeth Russo, female    DOB: 07-13-1960, 54 y.o.   MRN: [id number]  HPI: Elizabeth Russo is a 54 y.o. female  Chief Complaint  Patient presents with  . Abdominal Pain    lower abdominal pain, some urinary frequency. Comes and goes depending on how she moves. Feels it the most in the mornings. She states it seemed to start when she started the gout meds, but they told her that wouldn't cause her issues.  . Referral    advised patient to call and schedule eye exam   Lower back pain; she denies abdominal pain; going on for a month It is not constant; hurts with certain movements She has this thing in her head that it might be her kidneys, because her sister had kidney failure This started after the doctor gave her allopurinol and colchicine, Dr. Jefm Bryant (rheumatologist); she goes back to see him Dec 21st  Her blood pressure is high today; has not checked in a few weeks; it was 142/86 at Dr. Scharlene Gloss office; she is avoiding NSAIDs; no decongestants; no black licorice; drinks coffee  Relevant past medical, surgical, family and social history reviewed and updated as indicated.  She is scared because her sister had diabetes and got renal failure  Interim medical history since our last visit reviewed. Allergies and medications reviewed and updated.  Review of Systems  Constitutional: Positive for unexpected weight change (weight gain 13 pounds over 4 months, not explained).  Endocrine: Negative for polyuria.  Genitourinary: Positive for frequency (a few nights ago, up and down and up and down going to the bathroom) and pelvic pain (just pressure down there). Negative for dysuria, urgency and hematuria.       No odor; has to wait a while for the urine to come  Per HPI unless specifically indicated above     Objective:    BP 149/102 mmHg   Pulse 59  Temp(Src) 97.2 F (36.2 C)  Wt 256 lb (116.121 kg)  SpO2 99%  LMP  (LMP Unknown)  Wt Readings from Last 3 Encounters:  12/25/14 256 lb (116.121 kg)  10/23/14 248 lb (112.492 kg)  08/19/14 243 lb (110.224 kg)    Physical Exam  Constitutional: She appears well-developed and well-nourished.  Weight gain 13 pounds over last 4 months  HENT:  Mouth/Throat: Mucous membranes are normal. Mucous membranes are not dry.  Neck: No JVD present.  Cardiovascular: Normal rate and regular rhythm.   No extrasystoles are present.  Pulmonary/Chest: Effort normal and breath sounds normal.  Abdominal: Soft. Normal appearance and bowel sounds are normal. She exhibits no distension and no abdominal bruit. There is no tenderness. There is no guarding and no CVA tenderness.  Musculoskeletal: She exhibits no edema.       Lumbar back: She exhibits tenderness (over lumbar region bilaterally).  Neurological: She is alert.  Skin: Skin is warm. No erythema. No pallor.  Psychiatric: She has a normal mood and affect. Her behavior is normal.    Results for orders placed or performed in visit on 10/23/14  Uric acid  Result Value Ref Range   Uric Acid 6.2 2.5 - 7.1 mg/dL  Hgb A1c w/o eAG  Result Value Ref Range   Hgb A1c MFr Bld 6.2 (H) 4.8 - 5.6 %  ANA  w/Reflex if Positive  Result Value Ref Range   Anit Nuclear Antibody(ANA) Negative Negative  Comprehensive metabolic panel  Result Value Ref Range   Glucose 98 65 - 99 mg/dL   BUN 10 6 - 24 mg/dL   Creatinine, Ser 0.88 0.57 - 1.00 mg/dL   GFR calc non Af Amer 75 >59 mL/min/1.73   GFR calc Af Amer 87 >59 mL/min/1.73   BUN/Creatinine Ratio 11 9 - 23   Sodium 140 134 - 144 mmol/L   Potassium 4.1 3.5 - 5.2 mmol/L   Chloride 102 97 - 108 mmol/L   CO2 23 18 - 29 mmol/L   Calcium 9.1 8.7 - 10.2 mg/dL   Total Protein 7.0 6.0 - 8.5 g/dL   Albumin 4.1 3.5 - 5.5 g/dL   Globulin, Total 2.9 1.5 - 4.5 g/dL   Albumin/Globulin Ratio 1.4 1.1 - 2.5    Bilirubin Total 0.3 0.0 - 1.2 mg/dL   Alkaline Phosphatase 89 39 - 117 IU/L   AST 19 0 - 40 IU/L   ALT 15 0 - 32 IU/L  CBC with Differential/Platelet  Result Value Ref Range   WBC 4.9 3.4 - 10.8 x10E3/uL   RBC 4.90 3.77 - 5.28 x10E6/uL   Hemoglobin 13.7 11.1 - 15.9 g/dL   Hematocrit 41.4 34.0 - 46.6 %   MCV 85 79 - 97 fL   MCH 28.0 26.6 - 33.0 pg   MCHC 33.1 31.5 - 35.7 g/dL   RDW 14.0 12.3 - 15.4 %   Platelets 222 150 - 379 x10E3/uL   Neutrophils 36 %   Lymphs 49 %   Monocytes 11 %   Eos 3 %   Basos 1 %   Neutrophils Absolute 1.8 1.4 - 7.0 x10E3/uL   Lymphocytes Absolute 2.4 0.7 - 3.1 x10E3/uL   Monocytes Absolute 0.5 0.1 - 0.9 x10E3/uL   EOS (ABSOLUTE) 0.2 0.0 - 0.4 x10E3/uL   Basophils Absolute 0.0 0.0 - 0.2 x10E3/uL   Immature Granulocytes 0 %   Immature Grans (Abs) 0.0 0.0 - 0.1 x10E3/uL  Lipid Panel w/o Chol/HDL Ratio  Result Value Ref Range   Cholesterol, Total 189 100 - 199 mg/dL   Triglycerides 93 0 - 149 mg/dL   HDL 37 (L) >39 mg/dL   VLDL Cholesterol Cal 19 5 - 40 mg/dL   LDL Calculated 133 (H) 0 - 99 mg/dL  TSH  Result Value Ref Range   TSH 3.500 0.450 - 4.500 uIU/mL  T4, free  Result Value Ref Range   Free T4 1.39 0.82 - 1.77 ng/dL  LDH Isoenzyme  Result Value Ref Range   (LD) Fraction 1 14 (L) 17 - 32 %   (LD) Fraction 2 34 25 - 40 %   (LD) Fraction 3 27 17  - 27 %   (LD) Fraction 4 11 5  - 13 %   (LD) Fraction 5 14 4  - 20 %  C-reactive protein  Result Value Ref Range   CRP 9.3 (H) 0.0 - 4.9 mg/L      Assessment & Plan:   Problem List Items Addressed This Visit      Cardiovascular and Mediastinum   Essential hypertension, benign    See AVS; encouraged DASH guidelines; continue ACE-I and beta-blocker; increase CCB; return in 3 weeks      Relevant Medications   amLODipine (NORVASC) 10 MG tablet     Endocrine   Well controlled type 2 diabetes mellitus ([place])    Seeing endocrinologist; last A1c 6.2; check urine  microalbumin today;        Relevant Orders   Microalbumin, Urine Waived   Hypothyroidism    Weight gain 13 pounds over 4 months that patient does not think is explained by eating habits; check TSH        Genitourinary   Hematuria, microscopic    Trace on dip, 0-2 RBCs/hpf; will recheck in 3 weeks; discussed with patient        Other   Morbid obesity ([place])    Encouraged weight loss      Abnormal weight gain   Relevant Orders   TSH   Gout    Check creatinine and uric acid; I don't believe the rheumatologist would mind [place] collecting for him to review since we're drawing blood and he'll see her in five days      Relevant Orders   Uric acid   Low back pain - Primary    Suspect musculoskeletal; avoid NSAIDs; recheck urine in 3 weeks       Other Visit Diagnoses    Lower abdominal pain        Relevant Orders    UA/M w/rflx Culture, Routine (STAT)    CBC With Differential/Platelet    Comprehensive metabolic panel       Follow up plan: Return in about 3 weeks (around 01/15/2015) for high blood pressure and recheck urine with Dr. Sanda Klein.   Orders Placed This Encounter  Procedures  . UA/M w/rflx Culture, Routine (STAT)  . CBC With Differential/Platelet  . Comprehensive metabolic panel  . TSH  . Uric acid  . Microalbumin, Urine Waived

## 2014-12-26 ENCOUNTER — Telehealth: Payer: Self-pay | Admitting: Family Medicine

## 2014-12-26 DIAGNOSIS — E039 Hypothyroidism, unspecified: Secondary | ICD-10-CM

## 2014-12-26 LAB — COMPREHENSIVE METABOLIC PANEL
A/G RATIO: 1.3 (ref 1.1–2.5)
ALT: 17 IU/L (ref 0–32)
AST: 17 IU/L (ref 0–40)
Albumin: 3.9 g/dL (ref 3.5–5.5)
Alkaline Phosphatase: 104 IU/L (ref 39–117)
BILIRUBIN TOTAL: 0.2 mg/dL (ref 0.0–1.2)
BUN/Creatinine Ratio: 12 (ref 9–23)
BUN: 10 mg/dL (ref 6–24)
CHLORIDE: 102 mmol/L (ref 96–106)
CO2: 22 mmol/L (ref 18–29)
Calcium: 9.1 mg/dL (ref 8.7–10.2)
Creatinine, Ser: 0.83 mg/dL (ref 0.57–1.00)
GFR calc non Af Amer: 80 mL/min/{1.73_m2} (ref >59)
GFR, EST AFRICAN AMERICAN: 92 mL/min/{1.73_m2} (ref >59)
GLUCOSE: 118 mg/dL — AB (ref 65–99)
Globulin, Total: 3 g/dL (ref 1.5–4.5)
POTASSIUM: 3.6 mmol/L (ref 3.5–5.2)
Sodium: 137 mmol/L (ref 134–144)
TOTAL PROTEIN: 6.9 g/dL (ref 6.0–8.5)

## 2014-12-26 LAB — URIC ACID: URIC ACID: 5.1 mg/dL (ref 2.5–7.1)

## 2014-12-26 LAB — TSH: TSH: 5.31 u[IU]/mL — AB (ref 0.450–4.500)

## 2014-12-26 NOTE — Telephone Encounter (Signed)
Uric acid is at target (less than 6); she'll see rheum this week TSH is abnormal; call patient

## 2014-12-27 NOTE — Telephone Encounter (Signed)
I tried to call pt about lab results; left brief message; will try again Monday

## 2014-12-28 ENCOUNTER — Ambulatory Visit: Payer: BC Managed Care – PPO | Admitting: Family Medicine

## 2015-01-01 MED ORDER — LEVOTHYROXINE SODIUM 125 MCG PO TABS: 125.0000 ug | ORAL_TABLET | Freq: Every day | ORAL | Status: DC

## 2015-01-01 MED ORDER — AMLODIPINE BESYLATE 10 MG PO TABS: 10.0000 mg | ORAL_TABLET | Freq: Every day | ORAL | Status: DC

## 2015-01-01 NOTE — Assessment & Plan Note (Signed)
Increase dose from 112 to 125; recheck TSH in 8 weeks

## 2015-01-01 NOTE — Telephone Encounter (Signed)
Discussed labs Gaining weight; feeling really tired; let's increase from 112 to 125 mcg strength, recheck labs in 8 weeks Orders entered, new Rx sent to [facility]

## 2015-01-15 ENCOUNTER — Ambulatory Visit: Payer: BC Managed Care – PPO | Admitting: Family Medicine

## 2015-01-27 ENCOUNTER — Other Ambulatory Visit: Payer: Self-pay | Admitting: Family Medicine

## 2015-01-27 DIAGNOSIS — E119 Type 2 diabetes mellitus without complications: Secondary | ICD-10-CM

## 2015-01-27 MED ORDER — INSULIN PEN NEEDLE 32G X 4 MM MISC: Status: DC

## 2015-01-27 NOTE — Telephone Encounter (Signed)
I will put in new referral Sending Rx

## 2015-01-27 NOTE — Telephone Encounter (Signed)
Routing to provider  

## 2015-01-27 NOTE — Telephone Encounter (Signed)
Patient called stated that she is out of her Insulin Pen Needle (BD PEN NEEDLE NANO U/F) 32G X 4 MM MISC. She called the office from where she was getting them but they told her that the provider is no longer working for them and also needs a new refill to another doctor. Thanks.

## 2015-01-27 NOTE — Assessment & Plan Note (Signed)
On insulin; her endocrinologist left practice; will refer to another endo

## 2015-02-05 ENCOUNTER — Encounter: Payer: Self-pay | Admitting: Family Medicine

## 2015-02-05 DIAGNOSIS — I252 Old myocardial infarction: Secondary | ICD-10-CM | POA: Insufficient documentation

## 2015-02-05 DIAGNOSIS — Z9861 Coronary angioplasty status: Secondary | ICD-10-CM | POA: Insufficient documentation

## 2015-02-16 ENCOUNTER — Other Ambulatory Visit: Payer: Self-pay | Admitting: Family Medicine

## 2015-02-16 DIAGNOSIS — E119 Type 2 diabetes mellitus without complications: Secondary | ICD-10-CM

## 2015-02-16 NOTE — Telephone Encounter (Signed)
Pt needs refill for toujeo and novalog sent to [facility]

## 2015-02-17 NOTE — Telephone Encounter (Signed)
Left message to call.

## 2015-02-17 NOTE — Telephone Encounter (Signed)
I don't treat her diabetes

## 2015-02-17 NOTE — Telephone Encounter (Signed)
Routing to provider  

## 2015-02-17 NOTE — Telephone Encounter (Signed)
She is not seeing the endocrinologist anymore. She was seeing Dr. Leana Roe who left and she says they did not mention setting her up with anyone else.

## 2015-02-18 MED ORDER — INSULIN GLARGINE 300 UNIT/ML ~~LOC~~ SOPN: 14.0000 [IU] | PEN_INJECTOR | Freq: Every day | SUBCUTANEOUS | Status: DC

## 2015-02-18 NOTE — Telephone Encounter (Signed)
Left message to call.

## 2015-02-18 NOTE — Telephone Encounter (Signed)
Patient notified, she would like to still see Dr. Sanda Klein. Appointment scheduled and advised her it would be at the [facility] location. She states she is not out of the Novolog at this time.

## 2015-02-18 NOTE — Telephone Encounter (Signed)
If primary care is going to treat her diabetes, make sure she is on someone's schedule please for April 14th or just after; thanks I'll send refill of the Toujeo this time I can't send the Novolog insulin as written; it's incomplete; thanks

## 2015-03-01 ENCOUNTER — Other Ambulatory Visit: Payer: Self-pay

## 2015-03-01 DIAGNOSIS — E119 Type 2 diabetes mellitus without complications: Secondary | ICD-10-CM

## 2015-03-01 NOTE — Telephone Encounter (Signed)
Please ask patient how much she actually uses and we'll go with that for the instructions Please ask her too if she has heard about her endocrinology appt; referral was entered in January for [place]; thanks

## 2015-03-01 NOTE — Telephone Encounter (Signed)
Fax came through for fix on patients Insulin pen.   Pharmacist states Toujeo dose is 16 units subq daily.  We have as the directions 14 units daily.

## 2015-03-02 MED ORDER — INSULIN GLARGINE 300 UNIT/ML ~~LOC~~ SOPN: 16.0000 [IU] | PEN_INJECTOR | Freq: Every day | SUBCUTANEOUS | Status: DC

## 2015-03-02 NOTE — Telephone Encounter (Signed)
Patient is taking 16 units of medication.

## 2015-03-02 NOTE — Telephone Encounter (Signed)
Can you please update the med list so I can send the refill with correct instructions?  Also, check on endo appt if she's not heard anything Thank you

## 2015-03-02 NOTE — Telephone Encounter (Signed)
Medication updated. Will follow up with endo appointment.

## 2015-03-02 NOTE — Telephone Encounter (Signed)
Great, thank you; Rx sent

## 2015-03-02 NOTE — Telephone Encounter (Signed)
Called patient and got no answer. Asked patient to please return my phone call.

## 2015-03-05 ENCOUNTER — Telehealth: Payer: Self-pay | Admitting: Family Medicine

## 2015-03-05 DIAGNOSIS — E039 Hypothyroidism, unspecified: Secondary | ICD-10-CM

## 2015-03-05 MED ORDER — LEVOTHYROXINE SODIUM 125 MCG PO TABS: 125.0000 ug | ORAL_TABLET | Freq: Every day | ORAL | Status: DC

## 2015-03-05 NOTE — Telephone Encounter (Signed)
Routing to provider  

## 2015-03-05 NOTE — Telephone Encounter (Signed)
I left detailed message; apologized that our office appears to have let her down; this is the first that I am hearing about the need for thyroid refill, and of course I'll take care of it right now I apologized for whichever staff member was rude to her She is due for thyroid test, so please have that done in the next week to make sure we've got the dose correct ------------------------ I called her work number and reached her personally; I offered apologies that there was a delay in getting to her refill request and that a staff member was rude to her; she says she requested the medicine Monday and today is Friday I offered to let her talk with office manager; she voiced her frustrations and said she'll just let it go; I'll still forward so we can improve our processes Rx was sent, confirmed pharmacy Asked her to come in for labs in the next week to make sure dose is correct; she agreed
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
## 2015-03-05 NOTE — Telephone Encounter (Signed)
levothyroxine (SYNTHROID, LEVOTHROID) 125 MCG tablet  Patient needs refill on her medication. She stated that pharmacy at [facility] has send request since Monday and have not received an answer back. Told patient I would give the message and call her pharmacy to check the status of it.

## 2015-03-05 NOTE — Assessment & Plan Note (Signed)
Check TSH 

## 2015-03-09 ENCOUNTER — Other Ambulatory Visit: Payer: BC Managed Care – PPO

## 2015-03-09 DIAGNOSIS — E039 Hypothyroidism, unspecified: Secondary | ICD-10-CM

## 2015-03-10 ENCOUNTER — Telehealth: Payer: Self-pay | Admitting: Family Medicine

## 2015-03-10 LAB — TSH: TSH: 2.43 u[IU]/mL (ref 0.450–4.500)

## 2015-03-10 MED ORDER — LEVOTHYROXINE SODIUM 125 MCG PO TABS: 125.0000 ug | ORAL_TABLET | Freq: Every day | ORAL | Status: DC

## 2015-03-10 NOTE — Telephone Encounter (Signed)
TSH is fine; sent in one year of refills; spoke with patient personally She still has gout or something, flaring up every two weeks; she saw him one time only; I encouraged her to give him a call

## 2015-03-15 ENCOUNTER — Other Ambulatory Visit: Payer: Self-pay

## 2015-03-15 MED ORDER — ONETOUCH ULTRA BLUE VI STRP: ORAL_STRIP | Status: DC

## 2015-03-15 NOTE — Telephone Encounter (Signed)
Rx sent 

## 2015-03-15 NOTE — Telephone Encounter (Signed)
Routing to provider, she no longer sees Dr.Phadke.

## 2015-03-18 ENCOUNTER — Other Ambulatory Visit: Payer: Self-pay

## 2015-03-18 NOTE — Telephone Encounter (Signed)
Amy, the refill request isn't complete Please get full instructions for this and then we can approve; thank you

## 2015-03-18 NOTE — Telephone Encounter (Signed)
Med refill, routing to provider.

## 2015-03-18 NOTE — Telephone Encounter (Signed)
Discussed patient's complaint with Helene Kelp and left message for patient to extend an apology for her experience.

## 2015-03-19 NOTE — Telephone Encounter (Signed)
Left message to call.

## 2015-03-22 NOTE — Telephone Encounter (Signed)
Updated directions added.

## 2015-03-22 NOTE — Telephone Encounter (Signed)
The Rx that is open and waiting for me to send in is still not updated for some reason; there are no units or instructions for frequency; I don't know what happened I called her She got box; sliding scale says 6-16 units; she is only using it twice it day; staying down in the 113 range, highest 133 STOP the short-acting insulin for now; just use Toujeo; check sugars; call me before April appt if needed

## 2015-03-22 NOTE — Telephone Encounter (Signed)
Left message to call.

## 2015-04-02 ENCOUNTER — Other Ambulatory Visit: Payer: Self-pay

## 2015-04-02 NOTE — Telephone Encounter (Signed)
erroneous encounter. Request already handled.

## 2015-04-26 ENCOUNTER — Ambulatory Visit (INDEPENDENT_AMBULATORY_CARE_PROVIDER_SITE_OTHER): Payer: BC Managed Care – PPO | Admitting: Family Medicine

## 2015-04-26 ENCOUNTER — Encounter: Payer: Self-pay | Admitting: Family Medicine

## 2015-04-26 VITALS — BP 118/74 | HR 55 | Temp 98.1°F | Resp 14 | Wt 256.0 lb

## 2015-04-26 DIAGNOSIS — I10 Essential (primary) hypertension: Secondary | ICD-10-CM

## 2015-04-26 DIAGNOSIS — E119 Type 2 diabetes mellitus without complications: Secondary | ICD-10-CM | POA: Diagnosis not present

## 2015-04-26 DIAGNOSIS — Z5181 Encounter for therapeutic drug level monitoring: Secondary | ICD-10-CM

## 2015-04-26 DIAGNOSIS — E785 Hyperlipidemia, unspecified: Secondary | ICD-10-CM

## 2015-04-26 DIAGNOSIS — R3129 Other microscopic hematuria: Secondary | ICD-10-CM

## 2015-04-26 DIAGNOSIS — M1A0690 Idiopathic chronic gout, unspecified knee, without tophus (tophi): Secondary | ICD-10-CM | POA: Diagnosis not present

## 2015-04-26 DIAGNOSIS — E039 Hypothyroidism, unspecified: Secondary | ICD-10-CM | POA: Diagnosis not present

## 2015-04-26 DIAGNOSIS — I2510 Atherosclerotic heart disease of native coronary artery without angina pectoris: Secondary | ICD-10-CM

## 2015-04-26 LAB — POCT GLYCOSYLATED HEMOGLOBIN (HGB A1C): HEMOGLOBIN A1C: 5.9

## 2015-04-26 MED ORDER — INSULIN GLARGINE 300 UNIT/ML ~~LOC~~ SOPN: 15.0000 [IU] | PEN_INJECTOR | Freq: Every day | SUBCUTANEOUS | Status: DC

## 2015-04-26 MED ORDER — AMLODIPINE BESYLATE 5 MG PO TABS
5.0000 mg | ORAL_TABLET | Freq: Every day | ORAL | Status: DC
Start: 2015-04-26 — End: 2015-09-15

## 2015-04-26 NOTE — Assessment & Plan Note (Signed)
Recheck today; no visible blood to patient

## 2015-04-26 NOTE — Progress Notes (Signed)
BP 118/74 mmHg  Pulse 55  Temp(Src) 98.1 F (36.7 C) (Oral)  Resp 14  Wt 256 lb (116.121 kg)  SpO2 97%  LMP  (LMP Unknown)   Subjective:    Patient ID: Elizabeth Russo, female    DOB: 02-10-1960, 55 y.o.   MRN: [id number]  HPI: Elizabeth Russo is a 55 y.o. female  Chief Complaint  Patient presents with  . Medication Refill  . Hypertension    a little dizziness  . Diabetes    Checks glucose 2x day low-98, high-188  . Hypothyroidism  . Gout   Diabetes; trying to watch diet; checking sugars 2x a day; range 98-188; no lows or highs; last eye exam in Jan; no eye damage from diabetes; no sores or calluses on the feet; no numbness in the feet; ankles swell a little, go down overnight  HTN; a little dizzy; no chest pain; does have some SHOB when she walks; no chest pain, wonders if weight gain; she is up to 256 pounds; up and down 2-3 pounds; cardiologist wanted to do stress test, but patient did not have it done  Hypothyroidism; stays between 255 and 250 pounds; last tsh normal Lab Results  Component Value Date   TSH 4.810* 05/04/2015   Gout, saw rheumatologist; he put her on allopurinol; keeping gout at [place]; left knee bothers her; not sure if gout; he increased her dosage, stopped one medicine, stayed on allopurinol; knows what to avoid  Depression screen [facility] 2/9 04/26/2015  Decreased Interest 0  Down, Depressed, Hopeless 0  PHQ - 2 Score 0    No flowsheet data found.  Relevant past medical, surgical, family and social history reviewed Past Medical History  Diagnosis Date  . Diabetes mellitus without complication ([place])   . Hypertension   . Hypothyroid   . Diverticulosis   . CAD (coronary artery disease)   . Vertigo    Past Surgical History  Procedure Laterality Date  . Abdominal hysterectomy    . Heart stent  2012   Family History  Problem Relation Age of Onset  . Heart disease Mother   . Diabetes Mother   . Hypertension Mother   . Hyperlipidemia Mother     . Thyroid disease Mother   . Cancer Father     colon  . Diabetes Father   . Hypertension Father   . Diabetes Sister   . Hypertension Sister   . Heart disease Sister   . Hyperlipidemia Sister   . Kidney disease Sister   . Cancer Maternal Grandfather     lung   Social History  Substance Use Topics  . Smoking status: Former Smoker    Quit date: 06/26/2011  . Smokeless tobacco: Never Used  . Alcohol Use: No   Interim medical history since our last visit reviewed. Allergies and medications reviewed and updated.  Review of Systems Per HPI unless specifically indicated above     Objective:    BP 118/74 mmHg  Pulse 55  Temp(Src) 98.1 F (36.7 C) (Oral)  Resp 14  Wt 256 lb (116.121 kg)  SpO2 97%  LMP  (LMP Unknown)  Wt Readings from Last 3 Encounters:  04/26/15 256 lb (116.121 kg)  12/25/14 256 lb (116.121 kg)  10/23/14 248 lb (112.492 kg)   body mass index is 43.92 kg/(m^2).  Physical Exam  Constitutional: She appears well-developed and well-nourished. No distress.  HENT:  Head: Normocephalic and atraumatic.  Eyes: EOM are normal. No scleral icterus.  Neck: No thyromegaly present.  Cardiovascular: Normal rate, regular rhythm and normal heart sounds.   No murmur heard. Pulmonary/Chest: Effort normal and breath sounds normal. No respiratory distress. She has no wheezes.  Abdominal: Soft. Bowel sounds are normal. She exhibits no distension.  Musculoskeletal: Normal range of motion. She exhibits no edema.  Neurological: She is alert. She exhibits normal muscle tone.  Skin: Skin is warm and dry. She is not diaphoretic. No pallor.  Psychiatric: She has a normal mood and affect. Her behavior is normal. Judgment and thought content normal.   Diabetic Foot Exam - Simple   Simple Foot Form  Diabetic Foot exam was performed with the following findings:  Yes 04/26/2015 11:02 PM  Visual Inspection  No deformities, no ulcerations, no other skin breakdown bilaterally:  Yes   Sensation Testing  Intact to touch and monofilament testing bilaterally:  Yes  Pulse Check  See comments:  Yes  Comments  D pedis pulses 2+        Assessment & Plan:   Problem List Items Addressed This Visit      Cardiovascular and Mediastinum   Essential hypertension, benign (Chronic)    Try the DASH guidelines; decrease amlodipine, monitor at home; continue other meds      Relevant Medications   amLODipine (NORVASC) 5 MG tablet   Coronary artery disease (Chronic)    Keep lipids controlled; encouraged patient to talk with her cardiologist about her [facility] to see if repeat stress testing due; stress test recommended      Relevant Medications   amLODipine (NORVASC) 5 MG tablet   Other Relevant Orders   Lipid Panel w/o Russo/HDL Ratio (Completed)     Endocrine   Well controlled type 2 diabetes mellitus ([place]) - Primary (Chronic)    Check A1c today; work on weigh tloss and healthy eating; check feet every night; take a 81 mg EC aspirin daily      Relevant Medications   Insulin Glargine (TOUJEO SOLOSTAR) 300 UNIT/ML SOPN   Other Relevant Orders   POCT HgB A1C (Completed)   Lipid Panel w/o Russo/HDL Ratio (Completed)   Ambulatory referral to diabetic education   Hypothyroidism (Chronic)    Check TSH      Relevant Orders   TSH (Completed)     Musculoskeletal and Integument   Gout of knee    Check uric acid, limit purine-rich foods      Relevant Orders   Uric acid (Completed)     Genitourinary   Hematuria, microscopic    Recheck today; no visible blood to patient      Relevant Orders   UA/M w/rflx Culture, Routine (Completed)     Other   Morbid obesity ([place]) (Chronic)    Refer to nutritionist; see AVS; less sitting, more standing and walking; no exercise program until cleared by cardiologist      Relevant Medications   Insulin Glargine (TOUJEO SOLOSTAR) 300 UNIT/ML SOPN   Other Relevant Orders   Ambulatory referral to diabetic education   Hyperlipidemia     Check lipids; goal LDL less than 70; limit saturated fats      Relevant Medications   amLODipine (NORVASC) 5 MG tablet   Medication monitoring encounter   Relevant Orders   Comprehensive metabolic panel (Completed)    Other Visit Diagnoses    Type 2 diabetes mellitus not at goal [facility])        Relevant Medications    Insulin Glargine (TOUJEO SOLOSTAR) 300 UNIT/ML SOPN  Follow up plan: Return in about 6 months (around 10/26/2015) for visit and fasting labs.  An after-visit summary was printed and given to the patient at [place].  Please see the patient instructions which may contain other information and recommendations beyond what is mentioned above in the assessment and plan.  Meds ordered this encounter  Medications  . amLODipine (NORVASC) 5 MG tablet    Sig: Take 1 tablet (5 mg total) by mouth daily.    Dispense:  30 tablet    Refill:  5    Changing to lower dose  . Insulin Glargine (TOUJEO SOLOSTAR) 300 UNIT/ML SOPN    Sig: Inject 15 Units into the skin daily.    Dispense:  3 pen    Refill:  1    Slightly lower amount    Orders Placed This Encounter  Procedures  . Microscopic Examination  . TSH  . UA/M w/rflx Culture, Routine  . Comprehensive metabolic panel  . Lipid Panel w/o Russo/HDL Ratio  . Uric acid  . Urine Culture, Routine  . Ambulatory referral to diabetic education  . POCT HgB A1C

## 2015-04-26 NOTE — Patient Instructions (Addendum)
Decrease your insulin by one unit to fifteen units daily Decrease your amlodipine from ten mg to five mg daily; check BP a few times a week over the next few weeks and call if top number is over 130 mmHg Check out the information at familydoctor.org entitled "What It Takes to Lose Weight" Try to lose between 1-2 pounds per week by taking in fewer calories and burning off more calories You can succeed by limiting portions, limiting foods dense in calories and fat, becoming more active, and drinking 8 glasses of water a day (64 ounces) Don't skip meals, especially breakfast, as skipping meals may alter your metabolism Do not use over-the-counter weight loss pills or gimmicks that claim rapid weight loss A healthy BMI (or body mass index) is between 18.5 and 24.9 You can calculate your ideal BMI at the [place] website ClubMon[employer]fr Start back on your daily 81 mg coated aspirin Do call your cardiologist about your shortness of breath and get in for that stress test We'll refer you to the nutritionist Return in 6 months

## 2015-04-26 NOTE — Assessment & Plan Note (Signed)
Try the DASH guidelines; decrease amlodipine, monitor at home; continue other meds

## 2015-04-26 NOTE — Assessment & Plan Note (Signed)
Keep lipids controlled; encouraged patient to talk with her cardiologist about her [facility] to see if repeat stress testing due; stress test recommended

## 2015-04-26 NOTE — Assessment & Plan Note (Signed)
Refer to nutritionist; see AVS; less sitting, more standing and walking; no exercise program until cleared by cardiologist

## 2015-04-26 NOTE — Assessment & Plan Note (Signed)
Check TSH 

## 2015-04-26 NOTE — Assessment & Plan Note (Signed)
Check lipids; goal LDL less than 70; limit saturated fats 

## 2015-04-26 NOTE — Assessment & Plan Note (Signed)
Check uric acid, limit purine-rich foods

## 2015-04-26 NOTE — Assessment & Plan Note (Addendum)
Check A1c today; work on weigh tloss and healthy eating; check feet every night; take a 81 mg EC aspirin daily

## 2015-05-06 LAB — COMPREHENSIVE METABOLIC PANEL
A/G RATIO: 1.3 (ref 1.2–2.2)
ALBUMIN: 4 g/dL (ref 3.5–5.5)
ALK PHOS: 86 IU/L (ref 39–117)
ALT: 15 IU/L (ref 0–32)
AST: 21 IU/L (ref 0–40)
BILIRUBIN TOTAL: 0.4 mg/dL (ref 0.0–1.2)
BUN / CREAT RATIO: 9 (ref 9–23)
BUN: 8 mg/dL (ref 6–24)
CHLORIDE: 102 mmol/L (ref 96–106)
CO2: 24 mmol/L (ref 18–29)
Calcium: 8.9 mg/dL (ref 8.7–10.2)
Creatinine, Ser: 0.85 mg/dL (ref 0.57–1.00)
GFR calc non Af Amer: 78 mL/min/{1.73_m2} (ref >59)
GFR, EST AFRICAN AMERICAN: 90 mL/min/{1.73_m2} (ref >59)
GLUCOSE: 118 mg/dL — AB (ref 65–99)
Globulin, Total: 3.2 g/dL (ref 1.5–4.5)
POTASSIUM: 4 mmol/L (ref 3.5–5.2)
SODIUM: 139 mmol/L (ref 134–144)
TOTAL PROTEIN: 7.2 g/dL (ref 6.0–8.5)

## 2015-05-06 LAB — LIPID PANEL W/O CHOL/HDL RATIO
CHOLESTEROL TOTAL: 184 mg/dL (ref 100–199)
HDL: 38 mg/dL — ABNORMAL LOW (ref >39)
LDL Calculated: 125 mg/dL — ABNORMAL HIGH (ref 0–99)
Triglycerides: 104 mg/dL (ref 0–149)
VLDL Cholesterol Cal: 21 mg/dL (ref 5–40)

## 2015-05-06 LAB — UA/M W/RFLX CULTURE, ROUTINE
Bilirubin, UA: NEGATIVE
GLUCOSE, UA: NEGATIVE
Ketones, UA: NEGATIVE
NITRITE UA: NEGATIVE
PH UA: 6 (ref 5.0–7.5)
PROTEIN UA: NEGATIVE
RBC, UA: NEGATIVE
SPEC GRAV UA: 1.016 (ref 1.005–1.030)
UUROB: 0.2 mg/dL (ref 0.2–1.0)

## 2015-05-06 LAB — TSH: TSH: 4.81 u[IU]/mL — ABNORMAL HIGH (ref 0.450–4.500)

## 2015-05-06 LAB — MICROSCOPIC EXAMINATION: Casts: NONE SEEN /lpf

## 2015-05-06 LAB — URINE CULTURE, REFLEX: Organism ID, Bacteria: NO GROWTH

## 2015-05-06 LAB — URIC ACID: Uric Acid: 5.5 mg/dL (ref 2.5–7.1)

## 2015-05-10 ENCOUNTER — Telehealth: Payer: Self-pay | Admitting: Family Medicine

## 2015-05-10 NOTE — Telephone Encounter (Signed)
Left voice mail

## 2015-05-10 NOTE — Telephone Encounter (Signed)
Patient received a call on this past Friday and was returning call. Stated that it was pertaining to her bloodwork

## 2015-05-11 ENCOUNTER — Telehealth: Payer: Self-pay

## 2015-05-11 DIAGNOSIS — E039 Hypothyroidism, unspecified: Secondary | ICD-10-CM

## 2015-05-11 MED ORDER — EZETIMIBE 10 MG PO TABS: 10.0000 mg | ORAL_TABLET | Freq: Every day | ORAL | Status: DC

## 2015-05-11 MED ORDER — LEVOTHYROXINE SODIUM 137 MCG PO TABS: 137.0000 ug | ORAL_TABLET | Freq: Every day | ORAL | Status: DC

## 2015-05-11 NOTE — Assessment & Plan Note (Signed)
Change dose; recheck TSH in 8 weeks

## 2015-05-11 NOTE — Telephone Encounter (Signed)
Patient wants to go ahead and try the new rx for Zetia.  Also wants to adjust dose of thyroid med states she has no energy and is very tired.

## 2015-05-11 NOTE — Telephone Encounter (Signed)
New Rxs sent Check TSH 8 weeks after starting new dose Orders entered

## 2015-05-11 NOTE — Telephone Encounter (Signed)
Pt needs a call back on [phone]. Pt states she needs the prescription for cholesterol and pt is requesting to up the dosage on thyroid medications. Pts work # is available. Pt will be at work until 3 :61 and after that [phone].

## 2015-05-14 ENCOUNTER — Emergency Department (HOSPITAL_COMMUNITY)
Admission: EM | Admit: 2015-05-14 | Discharge: 2015-05-15 | Disposition: A | Discharge status: Home / Self Care | Payer: BC Managed Care – PPO | Attending: Emergency Medicine | Admitting: Emergency Medicine

## 2015-05-14 ENCOUNTER — Emergency Department (HOSPITAL_COMMUNITY): Payer: BC Managed Care – PPO

## 2015-05-14 ENCOUNTER — Encounter (HOSPITAL_COMMUNITY): Payer: Self-pay | Admitting: *Deleted

## 2015-05-14 DIAGNOSIS — Z79899 Other long term (current) drug therapy: Secondary | ICD-10-CM | POA: Insufficient documentation

## 2015-05-14 DIAGNOSIS — Y92410 Unspecified street and highway as the place of occurrence of the external cause: Secondary | ICD-10-CM | POA: Insufficient documentation

## 2015-05-14 DIAGNOSIS — S29001A Unspecified injury of muscle and tendon of front wall of thorax, initial encounter: Secondary | ICD-10-CM | POA: Insufficient documentation

## 2015-05-14 DIAGNOSIS — Z794 Long term (current) use of insulin: Secondary | ICD-10-CM | POA: Insufficient documentation

## 2015-05-14 DIAGNOSIS — Z8719 Personal history of other diseases of the digestive system: Secondary | ICD-10-CM | POA: Diagnosis not present

## 2015-05-14 DIAGNOSIS — Z87891 Personal history of nicotine dependence: Secondary | ICD-10-CM | POA: Diagnosis not present

## 2015-05-14 DIAGNOSIS — E119 Type 2 diabetes mellitus without complications: Secondary | ICD-10-CM | POA: Diagnosis not present

## 2015-05-14 DIAGNOSIS — Y9389 Activity, other specified: Secondary | ICD-10-CM | POA: Insufficient documentation

## 2015-05-14 DIAGNOSIS — E039 Hypothyroidism, unspecified: Secondary | ICD-10-CM | POA: Diagnosis not present

## 2015-05-14 DIAGNOSIS — Y998 Other external cause status: Secondary | ICD-10-CM | POA: Insufficient documentation

## 2015-05-14 DIAGNOSIS — Z791 Long term (current) use of non-steroidal anti-inflammatories (NSAID): Secondary | ICD-10-CM | POA: Insufficient documentation

## 2015-05-14 DIAGNOSIS — I2510 Atherosclerotic heart disease of native coronary artery without angina pectoris: Secondary | ICD-10-CM | POA: Insufficient documentation

## 2015-05-14 DIAGNOSIS — I10 Essential (primary) hypertension: Secondary | ICD-10-CM | POA: Insufficient documentation

## 2015-05-14 DIAGNOSIS — Z7982 Long term (current) use of aspirin: Secondary | ICD-10-CM | POA: Insufficient documentation

## 2015-05-14 LAB — CBC
HCT: 41.6 % (ref 36.0–46.0)
HEMOGLOBIN: 13.9 g/dL (ref 12.0–15.0)
MCH: 28.4 pg (ref 26.0–34.0)
MCHC: 33.4 g/dL (ref 30.0–36.0)
MCV: 85.1 fL (ref 78.0–100.0)
PLATELETS: 233 10*3/uL (ref 150–400)
RBC: 4.89 MIL/uL (ref 3.87–5.11)
RDW: 13.3 % (ref 11.5–15.5)
WBC: 6.4 10*3/uL (ref 4.0–10.5)

## 2015-05-14 LAB — BASIC METABOLIC PANEL
ANION GAP: 10 (ref 5–15)
BUN: 7 mg/dL (ref 6–20)
CHLORIDE: 106 mmol/L (ref 101–111)
CO2: 25 mmol/L (ref 22–32)
Calcium: 9.1 mg/dL (ref 8.9–10.3)
Creatinine, Ser: 0.86 mg/dL (ref 0.44–1.00)
GFR calc non Af Amer: >60 mL/min (ref >60)
Glucose, Bld: 158 mg/dL — ABNORMAL HIGH (ref 65–99)
Potassium: 3.5 mmol/L (ref 3.5–5.1)
SODIUM: 141 mmol/L (ref 135–145)

## 2015-05-14 LAB — TROPONIN I
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
## 2015-05-14 NOTE — ED Notes (Signed)
Patient Elizabeth Russo any Chest pain at this time.

## 2015-05-15 MED ORDER — NAPROXEN 375 MG PO TABS: 375.0000 mg | ORAL_TABLET | Freq: Two times a day (BID) | ORAL | Status: DC

## 2015-05-15 MED ORDER — CYCLOBENZAPRINE HCL 10 MG PO TABS: 5.0000 mg | ORAL_TABLET | Freq: Two times a day (BID) | ORAL | Status: DC | PRN
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
## 2015-05-15 NOTE — Discharge Instructions (Signed)

## 2015-05-19 ENCOUNTER — Other Ambulatory Visit: Payer: Self-pay

## 2015-05-19 NOTE — Telephone Encounter (Signed)
I don't see that Plavix was one of our medicines; I believe she should be getting this from the heart doctor; thank you

## 2015-06-23 ENCOUNTER — Encounter: Payer: Self-pay | Admitting: Dietician

## 2015-06-23 ENCOUNTER — Encounter: Payer: BC Managed Care – PPO | Attending: Family Medicine | Admitting: Dietician

## 2015-06-23 VITALS — Ht 64.0 in | Wt 256.4 lb

## 2015-06-23 DIAGNOSIS — E669 Obesity, unspecified: Secondary | ICD-10-CM

## 2015-06-23 DIAGNOSIS — Z794 Long term (current) use of insulin: Secondary | ICD-10-CM | POA: Diagnosis not present

## 2015-06-23 DIAGNOSIS — E119 Type 2 diabetes mellitus without complications: Secondary | ICD-10-CM

## 2015-06-23 NOTE — Progress Notes (Signed)
Medical Nutrition Therapy: Visit start time: 1630  end time: 1730  Assessment:  Diagnosis: Diabetes, obesity Past medical history: HTN, HLD Psychosocial issues/ stress concerns: none Preferred learning method:  . No preference indicated  Current weight: 256.4lbs  Height: 5'4" Medications, supplements: reviewed list in chart with patient  Progress and evaluation: Patient tests BGs twice daily; reports ranging 98-150mg /dl before eating          Reports weight has been increasing lately; wants to lose weight and decrease medications.         Has decreased snack foods in past 2 weeks, and started some exercise.  Physical activity: recently started walking 20 minutes, 2 times a week  Dietary Intake:  Usual eating pattern includes 2 meals and 3-4 snacks per day. Dining out frequency: maybe 1-2 meals per week.  Breakfast: 7:30-8 2 boiled eggs, 2sl bacon, wh grain bagel with small amount butter Snack: nuts: walnuts, almonds, pecans, peanuts Lunch: occasionally eats: today 1 slice pizza Snack: nuts 3-4 times a day, sometimes more when not eating lunch Supper: salad with meat like chicken or burger patty.  Snack: nuts or sometimes oatmeal cookie or other sweet.  Beverages: water, coffee in am with agave syrup or sugar, rarely diet soda.   Nutrition Care Education: Topics covered: Diabetes, weight control Basic nutrition: basic food groups, appropriate nutrient balance, appropriate meal and snack schedule, general nutrition guidelines    Weight control: benefits of weight control, 1300kcal meal plan guide with 45%CHO, 25%protein, 30% fat; discussed food portions, importance of lowfat and low sugar foods along with adequate fiber.  Advanced nutrition: cooking techniques, food label reading Diabetes:  appropriate meal and snack schedule: advised allowing 2-3 hours between meals and snacks, appropriate carb intake and balance, and minimal daily carbohydrate needs.  Other lifestyle changes:  encouraged ongoing effort to increase physical activity  Nutritional Diagnosis:  Lares-3.3 Overweight/obesity As related to history of excess calories and inactivity, hypothyroidism.  As evidenced by patient report.  Intervention: Instruction as noted above.   Patient has made some positive lifestyle changes already, with support from her daughter (who has lost significant amount of weight).    She seems to be grazing midday rather than eating a lunch most days, so advised having set times for snacks every 2-3 hours to avoid constant eating.    Set goals with patient input.   She does not feel she needs RD follow-up at this time, but will call and schedule later if needed.   Education Materials given:  . General diet guidelines for Diabetes . Food lists/ Planning A Balanced Meal . Goals/ instructions  Learner/ who was taught:  . Patient   Level of understanding: Marland Kitchen Verbalizes/ demonstrates competency  Demonstrated degree of understanding via:   Teach back Learning barriers: . None  Willingness to learn/ readiness for change: . Eager, change in progress  Monitoring and Evaluation:  Dietary intake, exercise, BG control, and body weight      follow up: prn

## 2015-06-23 NOTE — Patient Instructions (Signed)
   Keep making healthy food choices, you're doing a great job!   Allow for at least 2 servings, or 30g of carb foods with your meals, and at least 15g carb with snacks.   Continue to build up your exercise, which will help with blood sugar, blood pressure and weight loss.   Healthy snack ideas: 3/4 cup whole grain cereal (cheerios, mini wheats) with 1/4 cup nuts and 1-2 Tbsp dried fruit        1 Cup fresh fruit with 3 squares graham crackers and peanut butter or 1/4 cup nuts        1 cup lowfat yogurt with 1/4 cup granola

## 2015-07-15 ENCOUNTER — Telehealth: Payer: Self-pay | Admitting: Family Medicine

## 2015-07-15 MED ORDER — LEVOTHYROXINE SODIUM 137 MCG PO TABS: 137.0000 ug | ORAL_TABLET | Freq: Every day | ORAL | Status: DC

## 2015-07-15 NOTE — Telephone Encounter (Signed)
Patient is completely out of Levothyroxine 137mg . She is asking that you please send the prescription to [facility]

## 2015-07-15 NOTE — Telephone Encounter (Signed)
Please let patient know that I only prescribed 2 months worth of medicine because we needed to check a TSH eight week after the dose change We were hoping that she would have gotten her labs already; I'll need to have her get a TSH drawn ASAP I'll send one week of medicine to allow her to get labs and for me to see results Please gently encourage her to not call us for refills after she has already run out of her medicine; if she'll give [place] a few days heads up, that will help [place] all out

## 2015-07-19 ENCOUNTER — Telehealth: Payer: Self-pay | Admitting: Family Medicine

## 2015-07-19 NOTE — Telephone Encounter (Signed)
Patient notified. Will come in this week to have TSH. Please print order.

## 2015-07-19 NOTE — Telephone Encounter (Signed)
I ordered the TSH in May; it's in the system as a future order which I hope you can release; she can get that done at [place] or at [facility] where she works and have results faxed to [place]; thank you

## 2015-07-21 ENCOUNTER — Other Ambulatory Visit: Payer: Self-pay

## 2015-07-21 ENCOUNTER — Telehealth: Payer: Self-pay | Admitting: Family Medicine

## 2015-07-21 DIAGNOSIS — E039 Hypothyroidism, unspecified: Secondary | ICD-10-CM

## 2015-07-21 NOTE — Telephone Encounter (Signed)
Left voice mail

## 2015-07-21 NOTE — Telephone Encounter (Signed)
Patient only have one pill left of her thyroid medication. She would like to know if she can stop by today to pick up her lab order, because she was told that lab work would have to be done before any more refills. I see she has a future order for May but I am not certain if it is still any good. [phone]

## 2015-07-23 LAB — TSH: TSH: 2.32 m[IU]/L

## 2015-07-23 NOTE — Telephone Encounter (Signed)
LM for patient to call office about lab order

## 2015-07-24 ENCOUNTER — Other Ambulatory Visit: Payer: Self-pay | Admitting: Family Medicine

## 2015-07-24 MED ORDER — LEVOTHYROXINE SODIUM 137 MCG PO TABS: 137.0000 ug | ORAL_TABLET | Freq: Every day | ORAL | Status: DC

## 2015-08-24 NOTE — Telephone Encounter (Signed)
COMPLETED

## 2015-09-14 ENCOUNTER — Other Ambulatory Visit: Payer: Self-pay

## 2015-09-14 DIAGNOSIS — E119 Type 2 diabetes mellitus without complications: Secondary | ICD-10-CM

## 2015-09-14 MED ORDER — INSULIN GLARGINE 300 UNIT/ML ~~LOC~~ SOPN: 15.0000 [IU] | PEN_INJECTOR | Freq: Every day | SUBCUTANEOUS | 2 refills | Status: DC

## 2015-09-15 ENCOUNTER — Ambulatory Visit (INDEPENDENT_AMBULATORY_CARE_PROVIDER_SITE_OTHER): Payer: BC Managed Care – PPO | Admitting: Family Medicine

## 2015-09-15 ENCOUNTER — Encounter: Payer: Self-pay | Admitting: Family Medicine

## 2015-09-15 DIAGNOSIS — I2510 Atherosclerotic heart disease of native coronary artery without angina pectoris: Secondary | ICD-10-CM

## 2015-09-15 DIAGNOSIS — E049 Nontoxic goiter, unspecified: Secondary | ICD-10-CM | POA: Diagnosis not present

## 2015-09-15 DIAGNOSIS — E785 Hyperlipidemia, unspecified: Secondary | ICD-10-CM | POA: Diagnosis not present

## 2015-09-15 DIAGNOSIS — Z5181 Encounter for therapeutic drug level monitoring: Secondary | ICD-10-CM | POA: Diagnosis not present

## 2015-09-15 DIAGNOSIS — I10 Essential (primary) hypertension: Secondary | ICD-10-CM

## 2015-09-15 DIAGNOSIS — E039 Hypothyroidism, unspecified: Secondary | ICD-10-CM

## 2015-09-15 DIAGNOSIS — E119 Type 2 diabetes mellitus without complications: Secondary | ICD-10-CM

## 2015-09-15 LAB — COMPLETE METABOLIC PANEL WITH GFR
ALT: 15 U/L (ref 6–29)
AST: 17 U/L (ref 10–35)
Albumin: 3.9 g/dL (ref 3.6–5.1)
Alkaline Phosphatase: 77 U/L (ref 33–130)
BUN: 13 mg/dL (ref 7–25)
CALCIUM: 9 mg/dL (ref 8.6–10.4)
CHLORIDE: 104 mmol/L (ref 98–110)
CO2: 22 mmol/L (ref 20–31)
CREATININE: 0.88 mg/dL (ref 0.50–1.05)
GFR, Est African American: 86 mL/min (ref >=60)
GFR, Est Non African American: 75 mL/min (ref >=60)
Glucose, Bld: 134 mg/dL — ABNORMAL HIGH (ref 65–99)
POTASSIUM: 3.8 mmol/L (ref 3.5–5.3)
SODIUM: 135 mmol/L (ref 135–146)
Total Bilirubin: 0.4 mg/dL (ref 0.2–1.2)
Total Protein: 7.2 g/dL (ref 6.1–8.1)

## 2015-09-15 LAB — LIPID PANEL
CHOL/HDL RATIO: 4.1 ratio (ref <=5.0)
Cholesterol: 152 mg/dL (ref 125–200)
HDL: 37 mg/dL — ABNORMAL LOW (ref >=46)
LDL Cholesterol: 100 mg/dL (ref <130)
Triglycerides: 75 mg/dL (ref <150)
VLDL: 15 mg/dL (ref <30)

## 2015-09-15 MED ORDER — AMOXICILLIN-POT CLAVULANATE 875-125 MG PO TABS: 1.0000 | ORAL_TABLET | Freq: Two times a day (BID) | ORAL | 0 refills | Status: AC

## 2015-09-15 MED ORDER — LOSARTAN POTASSIUM 25 MG PO TABS: 25.0000 mg | ORAL_TABLET | Freq: Every day | ORAL | 0 refills | Status: DC

## 2015-09-15 NOTE — Assessment & Plan Note (Signed)
Check A1c today; urine microalbumin:creatinine; foot exam done; eye exam UTD

## 2015-09-15 NOTE — Assessment & Plan Note (Signed)
No chest pain; on aspirin and carvedilolol; cannot tolerate statin

## 2015-09-15 NOTE — Assessment & Plan Note (Signed)
No nodules on thyoid [place]

## 2015-09-15 NOTE — Assessment & Plan Note (Signed)
Off of the ACE-I right now; stop the amlodipine and use losartan 25 mg daily; may go to 50 mg daily if needed, goal systolic is less than [id number] mmHg

## 2015-09-15 NOTE — Assessment & Plan Note (Signed)
Work on weight loss, 1 pound a week; encouraged weight loss

## 2015-09-15 NOTE — Assessment & Plan Note (Signed)
Last TSH normal; continue same dose 

## 2015-09-15 NOTE — Assessment & Plan Note (Signed)
intolerant of statins; continue zetia; limit saturated fats

## 2015-09-15 NOTE — Patient Instructions (Signed)
Start the antibiotic Please do eat yogurt daily or take a probiotic daily for the next month or two We want to replace the healthy germs in the gut If you notice foul, watery diarrhea in the next two months, schedule an appointment RIGHT AWAY Try to limit saturated fats in your diet (bologna, hot dogs, barbeque, cheeseburgers, hamburgers, steak, bacon, sausage, cheese, etc.) and get more fresh fruits, vegetables, and whole grains Check out the information at familydoctor.org entitled "Nutrition for Weight Loss: What You Need to Know about Fad Diets" Try to lose between 1-2 pounds per week by taking in fewer calories and burning off more calories You can succeed by limiting portions, limiting foods dense in calories and fat, becoming more active, and drinking 8 glasses of water a day (64 ounces) Don't skip meals, especially breakfast, as skipping meals may alter your metabolism Do not use over-the-counter weight loss pills or gimmicks that claim rapid weight loss A healthy BMI (or body mass index) is between 18.5 and 24.9 You can calculate your ideal BMI at the [place] website [employer]fr Let's get labs today If you have not heard anything from my staff in a week about any orders/referrals/studies from today, please contact us here to follow-up [phone] Stop the lisinopril and stop the amlodipine Start the new BP medicine, losartan, which will help protect your kidneys Check blood pressure and if top number is not consistently under 130, we need to raise the losartan to 50 mg daily Your goal blood pressure is less than 130 mmHg on top. Try to follow the DASH guidelines (DASH stands for Dietary Approaches to Stop Hypertension) Try to limit the sodium in your diet.  Ideally, consume less than 1.5 grams (less than 1,500mg ) per day. Do not add salt when cooking or at the table.  Check the sodium amount on labels when shopping, and choose items  lower in sodium when given a choice. Avoid or limit foods that already contain a lot of sodium. Eat a diet rich in fruits and vegetables and whole grains. Try to use PLAIN allergy medicine without the decongestant Avoid: phenylephrine, phenylpropanolamine, and pseudoephredine

## 2015-09-15 NOTE — Progress Notes (Signed)
BP (!) 142/84   Pulse 60   Temp 98.2 F (36.8 C) (Oral)   Resp 14   Wt 251 lb 9.6 oz (114.1 kg)   LMP  (LMP Unknown)   SpO2 96%   BMI 43.19 kg/m    Subjective:    Patient ID: Elizabeth Russo, female    DOB: 1960/09/27, 55 y.o.   MRN: [id number]  HPI: Elizabeth Russo is a 55 y.o. female  Chief Complaint  Patient presents with  . Allergic Reaction   Patient says she does not have an allergic reaction; she gets dizzy for a few seconds; more frequent; one morning she got up and tried to drive to work, but had to turn around and go home; she backed off the lisinopril because one nurse told her it could drop her blood pressure suddenly and make her dizzy; she stopped the lisinopril and the dizzy feeling went away completely; then yesterday, she put her glasses on words on computer screen were fuzzy, the dizziness came back when she took her glasses off; she felt sinus pressure and drainage like her ears were stopping up; she thinks it was the medicine; she never had a chance to check her BP during spell; not low blood sugars; no like a spinning; feels pressure when leaning forward; not often getting sinus infection; there has been a lot of stopped up feeling, drainage, over the last month; sensation in the ears; sneezing  Type 2 diabetes; no sores on the feet, just some dry spots; no numbness; sugars 130-150 fasting  HTN; 140/70-80; top numbers 130s and 140s  High cholesterol; not tolerating statins; she has tried and failed 3 different ones; taking zetia; she does eat saturated fats  She saw endo for her diabetes and goiter and thyroid disease earlier; doctor left area and so now we manage those issues here Lab Results  Component Value Date   TSH 2.32 07/21/2015    Depression screen [facility] 2/9 09/15/2015 06/23/2015 04/26/2015  Decreased Interest 0 0 0  Down, Depressed, Hopeless 0 0 0  PHQ - 2 Score 0 0 0   Relevant past medical, surgical, family and social history reviewed Past  Medical History:  Diagnosis Date  . CAD (coronary artery disease)   . Diabetes mellitus without complication ([place])   . Diverticulosis   . Hypertension   . Hypothyroid   . Morbid obesity ([place]) 06/26/2014  . Vertigo    Past Surgical History:  Procedure Laterality Date  . ABDOMINAL HYSTERECTOMY    . heart stent  2012   Family History  Problem Relation Age of Onset  . Heart disease Mother   . Diabetes Mother   . Hypertension Mother   . Hyperlipidemia Mother   . Thyroid disease Mother   . Cancer Father     colon  . Diabetes Father   . Hypertension Father   . Diabetes Sister   . Hypertension Sister   . Heart disease Sister   . Hyperlipidemia Sister   . Kidney disease Sister   . Cancer Maternal Grandfather     lung   Social History  Substance Use Topics  . Smoking status: Former Smoker    Quit date: 06/26/2011  . Smokeless tobacco: Never Used  . Alcohol use No    Interim medical history since last visit reviewed. Allergies and medications reviewed  Review of Systems Per HPI unless specifically indicated above     Objective:    BP (!) 142/84   Pulse  60   Temp 98.2 F (36.8 C) (Oral)   Resp 14   Wt 251 lb 9.6 oz (114.1 kg)   LMP  (LMP Unknown)   SpO2 96%   BMI 43.19 kg/m   Wt Readings from Last 3 Encounters:  09/15/15 251 lb 9.6 oz (114.1 kg)  06/23/15 256 lb 6.4 oz (116.3 kg)  04/26/15 256 lb (116.1 kg)    Physical Exam  Constitutional: She appears well-developed and well-nourished. No distress.  HENT:  Head: Normocephalic and atraumatic.  Right Ear: Hearing, tympanic membrane, external ear and ear canal normal.  Left Ear: Hearing, tympanic membrane, external ear and ear canal normal.  Nose: Mucosal edema (erythematous) present. No rhinorrhea. Right sinus exhibits no maxillary sinus tenderness and no frontal sinus tenderness. Left sinus exhibits no maxillary sinus tenderness and no frontal sinus tenderness.  Mouth/Throat: Oropharynx is clear and moist  and mucous membranes are normal.  Mild swelling over bridge  Eyes: EOM are normal. No scleral icterus.  Neck: No thyromegaly present.  Cardiovascular: Normal rate, regular rhythm and normal heart sounds.   No murmur heard. Pulmonary/Chest: Effort normal and breath sounds normal. No respiratory distress. She has no wheezes.  Abdominal: Soft. Bowel sounds are normal. She exhibits no distension.  Musculoskeletal: Normal range of motion. She exhibits no edema.  Lymphadenopathy:    She has cervical adenopathy (shoddy L and R LAD).  Neurological: She is alert. She exhibits normal muscle tone.  Skin: Skin is warm and dry. She is not diaphoretic. No pallor.  Psychiatric: She has a normal mood and affect. Her behavior is normal. Judgment and thought content normal.   Diabetic Foot Form - Detailed   Diabetic Foot Exam - detailed Diabetic Foot exam was performed with the following findings:  Yes 09/15/2015  9:21 AM  Visual Foot Exam completed.:  Yes  Are the toenails ingrown?:  No Normal Range of Motion:  Yes Pulse Foot Exam completed.:  Yes  Right Dorsalis Pedis:  Present Left Dorsalis Pedis:  Present  Sensory Foot Exam Completed.:  Yes Semmes-Weinstein Monofilament Test R Site 1-Great Toe:  Pos L Site 1-Great Toe:  Pos  R Site 4:  Pos L Site 4:  Pos  R Site 5:  Pos L Site 5:  Pos    Comments:  Callus lateral balls of both feet     Results for orders placed or performed in visit on 07/21/15  TSH  Result Value Ref Range   TSH 2.32 mIU/L      Assessment & Plan:   Problem List Items Addressed This Visit      Cardiovascular and Mediastinum   Essential hypertension, benign (Chronic)    Off of the ACE-I right now; stop the amlodipine and use losartan 25 mg daily; may go to 50 mg daily if needed, goal systolic is less than [id number] mmHg      Relevant Medications   losartan (COZAAR) 25 MG tablet   Coronary artery disease (Chronic)    No chest pain; on aspirin and carvedilolol; cannot  tolerate statin      Relevant Medications   losartan (COZAAR) 25 MG tablet     Endocrine   Well controlled type 2 diabetes mellitus (HCC) (Chronic)    Check A1c today; urine microalbumin:creatinine; foot exam done; eye exam UTD      Relevant Medications   losartan (COZAAR) 25 MG tablet   Other Relevant Orders   Hemoglobin A1c   Microalbumin / creatinine urine ratio   Hypothyroidism (  Chronic)    Last TSH normal; continue same dose      Goiter    No nodules on thyoid [place]        Other   Morbid obesity (HCC) (Chronic)    Work on weight loss, 1 pound a week; encouraged weight loss      Medication monitoring encounter    Monitor med with cmp      Relevant Orders   COMPLETE METABOLIC PANEL WITH GFR   Hyperlipidemia    intolerant of statins; continue zetia; limit saturated fats      Relevant Medications   losartan (COZAAR) 25 MG tablet   Other Relevant Orders   Lipid panel    Other Visit Diagnoses   None.     Follow up plan: Return in about 3 weeks (around 10/06/2015) for visit for hypertension and BMP (nonfasting).  An after-visit summary was printed and given to the patient at [place].  Please see the patient instructions which may contain other information and recommendations beyond what is mentioned above in the assessment and plan.  Meds ordered this encounter  Medications  . losartan (COZAAR) 25 MG tablet    Sig: Take 1 tablet (25 mg total) by mouth daily.    Dispense:  90 tablet    Refill:  0  . amoxicillin-clavulanate (AUGMENTIN) 875-125 MG tablet    Sig: Take 1 tablet by mouth 2 (two) times daily.    Dispense:  20 tablet    Refill:  0    Orders Placed This Encounter  Procedures  . Hemoglobin A1c  . Lipid panel  . Microalbumin / creatinine urine ratio  . COMPLETE METABOLIC PANEL WITH GFR

## 2015-09-15 NOTE — Assessment & Plan Note (Signed)
Monitor med with cmp

## 2015-09-16 ENCOUNTER — Encounter: Payer: Self-pay | Admitting: Family Medicine

## 2015-09-16 LAB — MICROALBUMIN / CREATININE URINE RATIO
CREATININE, URINE: 133 mg/dL (ref 20–320)
MICROALB UR: 0.3 mg/dL
MICROALB/CREAT RATIO: 2 ug/mg{creat} (ref <30)

## 2015-09-16 LAB — HEMOGLOBIN A1C
HEMOGLOBIN A1C: 6.3 % — AB (ref <5.7)
MEAN PLASMA GLUCOSE: 134 mg/dL

## 2015-09-16 NOTE — Progress Notes (Signed)
Letter to pt re: labs

## 2015-10-06 ENCOUNTER — Ambulatory Visit (INDEPENDENT_AMBULATORY_CARE_PROVIDER_SITE_OTHER): Payer: BC Managed Care – PPO | Admitting: Family Medicine

## 2015-10-06 ENCOUNTER — Encounter: Payer: Self-pay | Admitting: Family Medicine

## 2015-10-06 DIAGNOSIS — I10 Essential (primary) hypertension: Secondary | ICD-10-CM | POA: Diagnosis not present

## 2015-10-06 DIAGNOSIS — E119 Type 2 diabetes mellitus without complications: Secondary | ICD-10-CM | POA: Diagnosis not present

## 2015-10-06 DIAGNOSIS — R42 Dizziness and giddiness: Secondary | ICD-10-CM

## 2015-10-06 NOTE — Assessment & Plan Note (Addendum)
Last A1c showed excellent control; encouraged weight loss; talked with patient that if she could lose significant weight over the next year, we could very likely reduce or get rid of her insulin, other medicines; encouragement given; foot exam by MD today

## 2015-10-06 NOTE — Assessment & Plan Note (Addendum)
Encouraged weight loss,1 pound a week over the coming year; each pound will get her closer to goal; see AVS; if she can lose significant weight, we would most likely be able to reduce or stop some of her othe rmedicines

## 2015-10-06 NOTE — Assessment & Plan Note (Signed)
Refer back to Dr. Richardson Landry; reviewed MRI with CN VIII protocol done in 2013

## 2015-10-06 NOTE — Progress Notes (Signed)
BP (!) 146/94   Pulse (!) 58   Temp 98.3 F (36.8 C) (Oral)   Resp 14   Wt 254 lb (115.2 kg)   LMP  (LMP Unknown)   SpO2 98%   BMI 43.60 kg/m    Subjective:    Patient ID: Elizabeth Russo, female    DOB: May 13, 1960, 55 y.o.   MRN: [id number]  HPI: Elizabeth Russo is a 55 y.o. female  Chief Complaint  Patient presents with  . Hypertension    3 week f/u   Patient here for f/u  Dizziness; both sides, but right is worse; can't hear out of the right ear; had a case of vertigo and lost hearing; saw ENT and never came back, they don't why\ Had MRI  Went okay and was fine for a long while, but happening more frequently; once a week Holding head down and turning quickly to the side causes symptoms  Hypothyroidism; weight up and down  Obesity; she just snacks, more snacking than anything; skips lunch a lot of times; drinking water through the day; avoiding sugary drinks; avoiding eating late, nothing 8 pm; not influenced by co-workers; eats fast food, [employer] sandwiches, hamburgers, french fries  Type 2 diabetes; last A1c 6.3; no problems with feet; avoiding sugar, white bread, some white rice, not much; some potatoes; eye exam in March and no DR  Relevant past medical, surgical, family and social history reviewed Past Medical History:  Diagnosis Date  . CAD (coronary artery disease)   . Diabetes mellitus without complication ([place])   . Diverticulosis   . Hypertension   . Hypothyroid   . Morbid obesity ([place]) 06/26/2014  . Vertigo    Past Surgical History:  Procedure Laterality Date  . ABDOMINAL HYSTERECTOMY    . heart stent  2012   Family History  Problem Relation Age of Onset  . Heart disease Mother   . Diabetes Mother   . Hypertension Mother   . Hyperlipidemia Mother   . Thyroid disease Mother   . Cancer Father     colon  . Diabetes Father   . Hypertension Father   . Diabetes Sister   . Hypertension Sister   . Heart disease Sister   . Hyperlipidemia  Sister   . Kidney disease Sister   . Cancer Maternal Grandfather     lung   Social History  Substance Use Topics  . Smoking status: Former Smoker    Quit date: 06/26/2011  . Smokeless tobacco: Never Used  . Alcohol use No   Interim medical history since last visit reviewed. Allergies and medications reviewed  Review of Systems Per HPI unless specifically indicated above     Objective:    BP (!) 146/94   Pulse (!) 58   Temp 98.3 F (36.8 C) (Oral)   Resp 14   Wt 254 lb (115.2 kg)   LMP  (LMP Unknown)   SpO2 98%   BMI 43.60 kg/m   Wt Readings from Last 3 Encounters:  10/06/15 254 lb (115.2 kg)  09/15/15 251 lb 9.6 oz (114.1 kg)  06/23/15 256 lb 6.4 oz (116.3 kg)    Physical Exam  Constitutional: She appears well-developed and well-nourished. No distress.  Morbidly obese  HENT:  Head: Normocephalic and atraumatic.  Right Ear: Hearing, tympanic membrane, external ear and ear canal normal.  Left Ear: Hearing, tympanic membrane, external ear and ear canal normal.  Nose: No rhinorrhea.  Mouth/Throat: Oropharynx is clear and moist.  Eyes:  EOM are normal. No scleral icterus.  Neck: No thyromegaly present.  Cardiovascular: Normal rate, regular rhythm and normal heart sounds.   No murmur heard. Pulmonary/Chest: Effort normal and breath sounds normal. No respiratory distress. She has no wheezes.  Abdominal: Soft. Bowel sounds are normal. She exhibits no distension.  Musculoskeletal: Normal range of motion. She exhibits no edema.  Neurological: She is alert. She exhibits normal muscle tone.  Skin: Skin is warm and dry. She is not diaphoretic. No pallor.  Psychiatric: She has a normal mood and affect. Her behavior is normal. Judgment and thought content normal.   Diabetic Foot Form - Detailed   Diabetic Foot Exam - detailed Diabetic Foot exam was performed with the following findings:  Yes 10/06/2015  8:36 PM  Visual Foot Exam completed.:  Yes  Are the toenails ingrown?:   No Normal Range of Motion:  Yes Pulse Foot Exam completed.:  Yes  Right Dorsalis Pedis:  Present Left Dorsalis Pedis:  Present  Sensory Foot Exam Completed.:  Yes Swelling:  No Semmes-Weinstein Monofilament Test R Site 1-Great Toe:  Pos L Site 1-Great Toe:  Pos  R Site 4:  Pos L Site 4:  Pos  R Site 5:  Pos L Site 5:  Pos        Results for orders placed or performed in visit on 09/15/15  Hemoglobin A1c  Result Value Ref Range   Hgb A1c MFr Bld 6.3 (H) <5.7 %   Mean Plasma Glucose 134 mg/dL  Lipid panel  Result Value Ref Range   Cholesterol 152 125 - 200 mg/dL   Triglycerides 75 <150 mg/dL   HDL 37 (L) >=46 mg/dL   Total Russo/HDL Ratio 4.1 <=5.0 Ratio   VLDL 15 <30 mg/dL   LDL Cholesterol 100 <130 mg/dL  Microalbumin / creatinine urine ratio  Result Value Ref Range   Creatinine, Urine 133 20 - 320 mg/dL   Microalb, Ur 0.3 Not estab mg/dL   Microalb Creat Ratio 2 <30 mcg/mg creat  COMPLETE METABOLIC PANEL WITH GFR  Result Value Ref Range   Sodium 135 135 - 146 mmol/L   Potassium 3.8 3.5 - 5.3 mmol/L   Chloride 104 98 - 110 mmol/L   CO2 22 20 - 31 mmol/L   Glucose, Bld 134 (H) 65 - 99 mg/dL   BUN 13 7 - 25 mg/dL   Creat 0.88 0.50 - 1.05 mg/dL   Total Bilirubin 0.4 0.2 - 1.2 mg/dL   Alkaline Phosphatase 77 33 - 130 U/L   AST 17 10 - 35 U/L   ALT 15 6 - 29 U/L   Total Protein 7.2 6.1 - 8.1 g/dL   Albumin 3.9 3.6 - 5.1 g/dL   Calcium 9.0 8.6 - 10.4 mg/dL   GFR, Est African American 86 >=60 mL/min   GFR, Est Non African American 75 >=60 mL/min      Assessment & Plan:   Problem List Items Addressed This Visit      Cardiovascular and Mediastinum   Essential hypertension, benign (Chronic)    Not to goal; increase losartan to 50 mg; recheck BMP in 3 weeks; pt to monitor BP, work on weight loss      Relevant Medications   losartan (COZAAR) 50 MG tablet     Endocrine   Well controlled type 2 diabetes mellitus (HCC) (Chronic)    Last A1c showed excellent control;  encouraged weight loss; talked with patient that if she could lose significant weight over the next  year, we could very likely reduce or get rid of her insulin, other medicines; encouragement given; foot exam by MD today      Relevant Medications   losartan (COZAAR) 50 MG tablet     Other   Vertigo    Refer back to Dr. Richardson Landry; reviewed MRI with CN VIII protocol done in 2013      Relevant Orders   Ambulatory referral to ENT   Morbid obesity ([place]) (Chronic)    Encouraged weight loss,1 pound a week over the coming year; each pound will get her closer to goal; see AVS; if she can lose significant weight, we would most likely be able to reduce or stop some of her othe rmedicines       Other Visit Diagnoses   None.     Follow up plan: Return in about 3 months (around 01/05/2016) for visit; cancel October appt.  An after-visit summary was printed and given to the patient at [place].  Please see the patient instructions which may contain other information and recommendations beyond what is mentioned above in the assessment and plan.  Meds ordered this encounter  Medications  . losartan (COZAAR) 50 MG tablet    Sig: Take 1 tablet (50 mg total) by mouth daily. For blood pressure and to protect kidneys    Dispense:  90 tablet    Refill:  1    Orders Placed This Encounter  Procedures  . Ambulatory referral to ENT   Face-to-face time with patient was more than 25 minutes, >50% time spent counseling and coordination of care

## 2015-10-06 NOTE — Patient Instructions (Addendum)
We'll have you see the Ear Nose Throat doctor Check out the information at familydoctor.org entitled "Nutrition for Weight Loss: What You Need to Know about Fad Diets" Try to lose between 1-2 pounds per week by taking in fewer calories and burning off more calories You can succeed by limiting portions, limiting foods dense in calories and fat, becoming more active, and drinking 8 glasses of water a day (64 ounces) Don't skip meals, especially breakfast, as skipping meals may alter your metabolism Do not use over-the-counter weight loss pills or gimmicks that claim rapid weight loss A healthy BMI (or body mass index) is between 18.5 and 24.9 You can calculate your ideal BMI at the [place] website [employer]Monet[employer]fr Return in 3 months

## 2015-10-09 ENCOUNTER — Telehealth: Payer: Self-pay | Admitting: Family Medicine

## 2015-10-09 DIAGNOSIS — Z5181 Encounter for therapeutic drug level monitoring: Secondary | ICD-10-CM

## 2015-10-09 MED ORDER — LOSARTAN POTASSIUM 50 MG PO TABS: 50.0000 mg | ORAL_TABLET | Freq: Every day | ORAL | 1 refills | Status: DC

## 2015-10-09 NOTE — Telephone Encounter (Signed)
Increase losartan from 25 mg to 50 mg daily Return in 3 weeks to see Roselyn Reef for BP check and BMP

## 2015-10-09 NOTE — Assessment & Plan Note (Signed)
Recheck K+ and Cr on ARB increase

## 2015-10-09 NOTE — Assessment & Plan Note (Signed)
Not to goal; increase losartan to 50 mg; recheck BMP in 3 weeks; pt to monitor BP, work on weight loss

## 2015-10-11 MED ORDER — LOSARTAN POTASSIUM 50 MG PO TABS: 50.0000 mg | ORAL_TABLET | Freq: Every day | ORAL | 1 refills | Status: DC

## 2015-10-11 NOTE — Telephone Encounter (Signed)
Patient notified, but please resend to [place]

## 2015-10-11 NOTE — Telephone Encounter (Signed)
I called work, unable to find her in directory to leave message I called home # listed [phone], but it was the [employer] payment center ----------------------------------- Elizabeth Russo, please call patient Increase losartan to 50 mg daily Return in 2.5 to 3 weeks for BP recheck with you and BMP (already ordered)

## 2015-10-26 ENCOUNTER — Ambulatory Visit: Payer: BC Managed Care – PPO | Admitting: Family Medicine

## 2015-10-28 LAB — HM MAMMOGRAPHY

## 2016-01-05 ENCOUNTER — Ambulatory Visit (INDEPENDENT_AMBULATORY_CARE_PROVIDER_SITE_OTHER): Payer: BC Managed Care – PPO | Admitting: Family Medicine

## 2016-01-05 ENCOUNTER — Other Ambulatory Visit: Payer: Self-pay | Admitting: Family Medicine

## 2016-01-05 ENCOUNTER — Encounter: Payer: Self-pay | Admitting: Family Medicine

## 2016-01-05 VITALS — BP 128/72 | HR 54 | Temp 98.1°F | Resp 16 | Ht 64.0 in | Wt 258.3 lb

## 2016-01-05 DIAGNOSIS — Z5181 Encounter for therapeutic drug level monitoring: Secondary | ICD-10-CM

## 2016-01-05 DIAGNOSIS — E039 Hypothyroidism, unspecified: Secondary | ICD-10-CM

## 2016-01-05 DIAGNOSIS — E782 Mixed hyperlipidemia: Secondary | ICD-10-CM | POA: Diagnosis not present

## 2016-01-05 DIAGNOSIS — I10 Essential (primary) hypertension: Secondary | ICD-10-CM

## 2016-01-05 DIAGNOSIS — M1A0690 Idiopathic chronic gout, unspecified knee, without tophus (tophi): Secondary | ICD-10-CM

## 2016-01-05 DIAGNOSIS — E119 Type 2 diabetes mellitus without complications: Secondary | ICD-10-CM | POA: Diagnosis not present

## 2016-01-05 DIAGNOSIS — R0602 Shortness of breath: Secondary | ICD-10-CM | POA: Diagnosis not present

## 2016-01-05 DIAGNOSIS — I2510 Atherosclerotic heart disease of native coronary artery without angina pectoris: Secondary | ICD-10-CM | POA: Diagnosis not present

## 2016-01-05 LAB — CBC WITH DIFFERENTIAL/PLATELET
BASOS PCT: 1 %
Basophils Absolute: 66 cells/uL (ref 0–200)
EOS ABS: 264 {cells}/uL (ref 15–500)
Eosinophils Relative: 4 %
HEMATOCRIT: 43.4 % (ref 35.0–45.0)
Hemoglobin: 14.3 g/dL (ref 11.7–15.5)
Lymphocytes Relative: 46 %
Lymphs Abs: 3036 cells/uL (ref 850–3900)
MCH: 28.5 pg (ref 27.0–33.0)
MCHC: 32.9 g/dL (ref 32.0–36.0)
MCV: 86.5 fL (ref 80.0–100.0)
MPV: 8.6 fL (ref 7.5–12.5)
Monocytes Absolute: 660 cells/uL (ref 200–950)
Monocytes Relative: 10 %
Neutro Abs: 2574 cells/uL (ref 1500–7800)
Neutrophils Relative %: 39 %
Platelets: 218 10*3/uL (ref 140–400)
RBC: 5.02 MIL/uL (ref 3.80–5.10)
RDW: 14.1 % (ref 11.0–15.0)
WBC: 6.6 10*3/uL (ref 3.8–10.8)

## 2016-01-05 LAB — COMPLETE METABOLIC PANEL WITH GFR
ALBUMIN: 3.8 g/dL (ref 3.6–5.1)
ALK PHOS: 81 U/L (ref 33–130)
ALT: 26 U/L (ref 6–29)
AST: 21 U/L (ref 10–35)
BUN: 10 mg/dL (ref 7–25)
CALCIUM: 8.6 mg/dL (ref 8.6–10.4)
CO2: 27 mmol/L (ref 20–31)
CREATININE: 0.83 mg/dL (ref 0.50–1.05)
Chloride: 106 mmol/L (ref 98–110)
GFR, Est African American: >89 mL/min (ref >=60)
GFR, Est Non African American: 80 mL/min (ref >=60)
Glucose, Bld: 114 mg/dL — ABNORMAL HIGH (ref 65–99)
Potassium: 4 mmol/L (ref 3.5–5.3)
Sodium: 139 mmol/L (ref 135–146)
Total Bilirubin: 0.3 mg/dL (ref 0.2–1.2)
Total Protein: 7 g/dL (ref 6.1–8.1)

## 2016-01-05 LAB — LIPID PANEL
CHOL/HDL RATIO: 4.4 ratio (ref <5.0)
CHOLESTEROL: 155 mg/dL (ref <200)
HDL: 35 mg/dL — ABNORMAL LOW (ref >50)
LDL Cholesterol: 98 mg/dL (ref <100)
Triglycerides: 112 mg/dL (ref <150)
VLDL: 22 mg/dL (ref <30)

## 2016-01-05 LAB — URIC ACID: Uric Acid, Serum: 6 mg/dL (ref 2.5–7.0)

## 2016-01-05 LAB — BRAIN NATRIURETIC PEPTIDE: BRAIN NATRIURETIC PEPTIDE: 31 pg/mL (ref <100)

## 2016-01-05 LAB — POCT GLYCOSYLATED HEMOGLOBIN (HGB A1C): HEMOGLOBIN A1C: 6.4

## 2016-01-05 LAB — TSH: TSH: 5.98 mIU/L — ABNORMAL HIGH

## 2016-01-05 MED ORDER — LEVOTHYROXINE SODIUM 137 MCG PO TABS: 137.0000 ug | ORAL_TABLET | Freq: Every day | ORAL | 11 refills | Status: DC

## 2016-01-05 MED ORDER — LEVOTHYROXINE SODIUM 150 MCG PO TABS: ORAL_TABLET | ORAL | 1 refills | Status: DC

## 2016-01-05 NOTE — Assessment & Plan Note (Signed)
Really encourage weight loss; try to fix own meals, avoid fast food

## 2016-01-05 NOTE — Patient Instructions (Addendum)
Call your cardiologist about your shortness of breath as soon as you finish here We'll send them a copy of your labs and EKG Check out the information at familydoctor.org entitled "Nutrition for Weight Loss: What You Need to Know about Fad Diets" Try to lose between 1-2 pounds per week by taking in fewer calories and burning off more calories You can succeed by limiting portions, limiting foods dense in calories and fat, becoming more active, and drinking 8 glasses of water a day (64 ounces) Don't skip meals, especially breakfast, as skipping meals may alter your metabolism Do not use over-the-counter weight loss pills or gimmicks that claim rapid weight loss A healthy BMI (or body mass index) is between 18.5 and 24.9 You can calculate your ideal BMI at the [employer] website ClubMonetize.fr Try to limit saturated fats in your diet (bologna, hot dogs, barbeque, cheeseburgers, hamburgers, steak, bacon, sausage, cheese, etc.) and get more fresh fruits, vegetables, and whole grains Consider a vegetarian or vegan diet to help you with weight loss and controlling your cholesterol If you develop worsening symptoms, call 911 Please do see your eye doctor regularly, and have your eyes examined every year (or more often per his or her recommendation) Check your feet every night and let me know right away of any sores, infections, numbness, etc. Try to limit sweets, white bread, white rice, white potatoes

## 2016-01-05 NOTE — Progress Notes (Signed)
BP 128/72 (BP Location: Right Arm, Patient Position: Sitting, Cuff Size: Large)   Pulse (!) 54   Temp 98.1 F (36.7 C)   Resp 16   Ht 5\' 4"  (1.626 m)   Wt 258 lb 5 oz (117.2 kg)   LMP  (LMP Unknown)   SpO2 96%   BMI 44.34 kg/m    Subjective:    Patient ID: Elizabeth Russo, female    DOB: [birth year], 55 y.o.   MRN: [id number]  HPI: Elizabeth Russo is a 55 y.o. female  Chief Complaint  Patient presents with  . Diabetes    average blood sugars between 119 and 156  . Hypertension   Type 2 diabetes; a1c today 6.4, excellent control; holidays are a little tougher; a little dry mouth, blurred vision  High cholesterol; tries to avoid eggs and bacon and cheese; bacon is toughie; getting fiber, oatmeal almost every day  We talked about her history of heart attack three years ago; she has a cardiologist at [facility] and was supposed to have had a stress test a while back, but patient canceled it; she has been a little short of breath and has had some abdominal bloating;symptoms going on for a month; she has not called them about this; some swelling in the ankles; no chest pain; has some gas; some cough; no chest xray  She has hypothyroidism and her thyroid test bounces up and down; she has had weight gain  Depression screen [facility] 2/9 01/05/2016 09/15/2015 06/23/2015 04/26/2015  Decreased Interest 0 0 0 0  Down, Depressed, Hopeless 0 0 0 0  PHQ - 2 Score 0 0 0 0   Relevant past medical, surgical, family and social history reviewed Past Medical History:  Diagnosis Date  . CAD (coronary artery disease)   . Diabetes mellitus without complication ([place])   . Diverticulosis   . Hypertension   . Hypothyroid   . Morbid obesity ([place]) 06/26/2014  . Vertigo    Past Surgical History:  Procedure Laterality Date  . ABDOMINAL HYSTERECTOMY    . heart stent  2012   Family History  Problem Relation Age of Onset  . Heart disease Mother   . Diabetes Mother   . Hypertension Mother   . Hyperlipidemia  Mother   . Thyroid disease Mother   . Cancer Father     colon  . Diabetes Father   . Hypertension Father   . Diabetes Sister   . Hypertension Sister   . Heart disease Sister   . Hyperlipidemia Sister   . Kidney disease Sister   . Cancer Maternal Grandfather     lung   Social History  Substance Use Topics  . Smoking status: Former Smoker    Quit date: 06/26/2011  . Smokeless tobacco: Never Used  . Alcohol use No    Interim medical history since last visit reviewed. Allergies and medications reviewed  Review of Systems Per HPI unless specifically indicated above     Objective:    BP 128/72 (BP Location: Right Arm, Patient Position: Sitting, Cuff Size: Large)   Pulse (!) 54   Temp 98.1 F (36.7 C)   Resp 16   Ht 5\' 4"  (1.626 m)   Wt 258 lb 5 oz (117.2 kg)   LMP  (LMP Unknown)   SpO2 96%   BMI 44.34 kg/m   Wt Readings from Last 3 Encounters:  01/05/16 258 lb 5 oz (117.2 kg)  10/06/15 254 lb (115.2 kg)  09/15/15 251  lb 9.6 oz (114.1 kg)    Physical Exam  Constitutional: She appears well-developed and well-nourished. No distress.  Morbidly obese  HENT:  Head: Normocephalic and atraumatic.  Right Ear: Hearing, tympanic membrane, external ear and ear canal normal.  Left Ear: Hearing, tympanic membrane, external ear and ear canal normal.  Nose: No rhinorrhea.  Mouth/Throat: Oropharynx is clear and moist.  Eyes: EOM are normal. No scleral icterus.  Neck: No thyromegaly present.  Cardiovascular: Normal rate, regular rhythm and normal heart sounds.   No murmur heard. Pulmonary/Chest: Effort normal and breath sounds normal. No respiratory distress. She has no wheezes.  Abdominal: Soft. Bowel sounds are normal. She exhibits no distension.  Musculoskeletal: Normal range of motion. She exhibits edema (just very trace lower extremity edema).  Neurological: She is alert. She exhibits normal muscle tone.  Skin: Skin is warm and dry. She is not diaphoretic. No pallor.    Psychiatric: She has a normal mood and affect. Her behavior is normal. Judgment and thought content normal.   Diabetic Foot Form - Detailed   Diabetic Foot Exam - detailed Diabetic Foot exam was performed with the following findings:  Yes 01/05/2016  8:36 AM  Visual Foot Exam completed.:  Yes  Are the toenails ingrown?:  No Normal Range of Motion:  Yes Pulse Foot Exam completed.:  Yes  Right Dorsalis Pedis:  Present Left Dorsalis Pedis:  Present  Sensory Foot Exam Completed.:  Yes Swelling:  No Semmes-Weinstein Monofilament Test R Site 1-Great Toe:  Pos L Site 1-Great Toe:  Pos  R Site 4:  Pos L Site 4:  Pos  R Site 5:  Pos L Site 5:  Pos       Results for orders placed or performed in visit on 01/05/16  POCT HgB A1C  Result Value Ref Range   Hemoglobin A1C 6.4       Assessment & Plan:   Problem List Items Addressed This Visit      Cardiovascular and Mediastinum   Essential hypertension, benign (Chronic)    Well-controlled; avoid salt      Coronary artery disease (Chronic)    Taking aspirin; urged patient to call cardiologist today to get evaluated, may need echo, stress test; check lipids today, goal LDL under 70; EKG done today showed T wave inversion anterior leads, unchanged from two EKGs in May; copy faxed to cardiologist      Relevant Orders   Lipid panel   CBC with Differential/Platelet     Endocrine   Well controlled type 2 diabetes mellitus ([place]) - Primary (Chronic)    Diabetes is well-controlled; foot exam today; work on weight loss and avoiding sweets      Relevant Orders   POCT HgB A1C (Completed)   Hypothyroidism (Chronic)    Weight gain 7 pounds over 3 months; check BNP but TSH fluctuates; check TSH again      Relevant Orders   TSH     Other   Shortness of breath    With hx of heart attack; some fluid; urged her to call her cardiologist today; check BNP, CBC, chest xray; call 911 if needed      Relevant Orders   DG Chest 2 View   CBC with  Differential/Platelet   B Nat Peptide   EKG 12-Lead   Morbid obesity ([place]) (Chronic)    Really encourage weight loss; try to fix own meals, avoid fast food      Medication monitoring encounter    Check  labs      Relevant Orders   COMPLETE METABOLIC PANEL WITH GFR   Hyperlipidemia    Limit saturated fats; check lipids today; has not tolerated statins; discussed Repatha and we'll see what lipids are and she'll consider      Relevant Orders   Lipid panel   Gout (Chronic)    Check uric acid; limit [place] and gravies      Relevant Orders   Uric acid      Follow up plan: Return in about 3 months (around 04/04/2016) for visit and fasting labs.  An after-visit summary was printed and given to the patient at [place].  Please see the patient instructions which may contain other information and recommendations beyond what is mentioned above in the assessment and plan.  Meds ordered this encounter  Medications  . fluticasone (FLONASE) 50 MCG/ACT nasal spray    Orders Placed This Encounter  Procedures  . DG Chest 2 View  . Lipid panel  . CBC with Differential/Platelet  . TSH  . COMPLETE METABOLIC PANEL WITH GFR  . B Nat Peptide  . Uric acid  . POCT HgB A1C  . EKG 12-Lead   Meds ordered this encounter  Medications  . fluticasone (FLONASE) 50 MCG/ACT nasal spray

## 2016-01-05 NOTE — Assessment & Plan Note (Signed)
With hx of heart attack; some fluid; urged her to call her cardiologist today; check BNP, CBC, chest xray; call 911 if needed

## 2016-01-05 NOTE — Assessment & Plan Note (Signed)
Check uric acid; limit [place] and gravies

## 2016-01-05 NOTE — Assessment & Plan Note (Addendum)
Taking aspirin; urged patient to call cardiologist today to get evaluated, may need echo, stress test; check lipids today, goal LDL under 70; EKG done today showed T wave inversion anterior leads, unchanged from two EKGs in May; copy faxed to cardiologist

## 2016-01-05 NOTE — Assessment & Plan Note (Signed)
Diabetes is well-controlled; foot exam today; work on weight loss and avoiding sweets

## 2016-01-05 NOTE — Assessment & Plan Note (Signed)
Check labs 

## 2016-01-05 NOTE — Assessment & Plan Note (Signed)
Well-controlled; avoid salt 

## 2016-01-05 NOTE — Assessment & Plan Note (Signed)
Weight gain 7 pounds over 3 months; check BNP but TSH fluctuates; check TSH again

## 2016-01-05 NOTE — Progress Notes (Signed)
Adjust dose up slightly; recheck tsh in 6-8 weeks

## 2016-01-05 NOTE — Assessment & Plan Note (Signed)
Adjust dose, recheck in 6-8 weeks

## 2016-01-05 NOTE — Assessment & Plan Note (Addendum)
Limit saturated fats; check lipids today; has not tolerated statins; discussed Repatha and we'll see what lipids are and she'll consider

## 2016-01-06 ENCOUNTER — Other Ambulatory Visit: Payer: Self-pay

## 2016-01-06 DIAGNOSIS — E039 Hypothyroidism, unspecified: Secondary | ICD-10-CM

## 2016-01-14 ENCOUNTER — Ambulatory Visit
Admission: RE | Admit: 2016-01-14 | Discharge: 2016-01-14 | Disposition: A | Discharge status: Home / Self Care | Payer: BC Managed Care – PPO | Source: Ambulatory Visit | Attending: Family Medicine | Admitting: Family Medicine

## 2016-01-14 DIAGNOSIS — R0602 Shortness of breath: Secondary | ICD-10-CM | POA: Insufficient documentation

## 2016-02-03 ENCOUNTER — Telehealth: Payer: Self-pay | Admitting: Family Medicine

## 2016-02-03 NOTE — Telephone Encounter (Signed)
Note received from patient's pharmacy manager/insurance company She is not on metformin Can you call her and find out if she used to be on metformin, what happened (GI side effects, intolerance, etc.) If she can take metformin, it would be a good idea to put her back on it to help with diabetes, and we could reduce her insulin most likely

## 2016-02-04 MED ORDER — METFORMIN HCL ER 500 MG PO TB24: 500.0000 mg | ORAL_TABLET | Freq: Every day | ORAL | 5 refills | Status: DC

## 2016-02-04 NOTE — Telephone Encounter (Signed)
Left detailed voicemail

## 2016-02-04 NOTE — Telephone Encounter (Signed)
Thank you Let's do start metformin It should help her diabetes and we may be able to use less insulin It may also help her lose weight If she notices her sugars coming down, just decrease the insulin (she can drop it from 15 to 10 units, more if needed) Goal fasting sugars are less than 150

## 2016-02-04 NOTE — Telephone Encounter (Signed)
Patient called states has never took metformin, but she will do whatever you think is best for her.  If you think she needs to start metformin she will.  But states is doing just fine on insulin.

## 2016-02-04 NOTE — Telephone Encounter (Signed)
Called left voicemail ...

## 2016-02-08 ENCOUNTER — Other Ambulatory Visit: Payer: Self-pay

## 2016-02-09 MED ORDER — EZETIMIBE 10 MG PO TABS: 10.0000 mg | ORAL_TABLET | Freq: Every day | ORAL | 11 refills | Status: DC

## 2016-03-01 ENCOUNTER — Other Ambulatory Visit: Payer: Self-pay | Admitting: Family Medicine

## 2016-03-01 NOTE — Telephone Encounter (Signed)
Request for thyroid med received Please remind patient that she's due for thyroid lab to see if this regime is the right dose combination I'll send in one refill, but please ask her to go soon

## 2016-03-02 NOTE — Telephone Encounter (Signed)
Left detailed voicemail

## 2016-04-04 ENCOUNTER — Ambulatory Visit (INDEPENDENT_AMBULATORY_CARE_PROVIDER_SITE_OTHER): Payer: BC Managed Care – PPO | Admitting: Family Medicine

## 2016-04-04 ENCOUNTER — Encounter: Payer: Self-pay | Admitting: Family Medicine

## 2016-04-04 VITALS — BP 162/92 | HR 82 | Temp 98.3°F | Wt 261.1 lb

## 2016-04-04 DIAGNOSIS — I10 Essential (primary) hypertension: Secondary | ICD-10-CM

## 2016-04-04 DIAGNOSIS — R0683 Snoring: Secondary | ICD-10-CM

## 2016-04-04 DIAGNOSIS — E039 Hypothyroidism, unspecified: Secondary | ICD-10-CM

## 2016-04-04 DIAGNOSIS — E119 Type 2 diabetes mellitus without complications: Secondary | ICD-10-CM

## 2016-04-04 DIAGNOSIS — Z5181 Encounter for therapeutic drug level monitoring: Secondary | ICD-10-CM | POA: Diagnosis not present

## 2016-04-04 DIAGNOSIS — E782 Mixed hyperlipidemia: Secondary | ICD-10-CM | POA: Diagnosis not present

## 2016-04-04 LAB — LIPID PANEL
Cholesterol: 160 mg/dL (ref <200)
HDL: 37 mg/dL — ABNORMAL LOW (ref >50)
LDL CALC: 102 mg/dL — AB (ref <100)
TRIGLYCERIDES: 104 mg/dL (ref <150)
Total CHOL/HDL Ratio: 4.3 Ratio (ref <5.0)
VLDL: 21 mg/dL (ref <30)

## 2016-04-04 LAB — COMPLETE METABOLIC PANEL WITH GFR
ALBUMIN: 3.9 g/dL (ref 3.6–5.1)
ALK PHOS: 75 U/L (ref 33–130)
ALT: 19 U/L (ref 6–29)
AST: 21 U/L (ref 10–35)
BILIRUBIN TOTAL: 0.5 mg/dL (ref 0.2–1.2)
BUN: 10 mg/dL (ref 7–25)
CO2: 24 mmol/L (ref 20–31)
CREATININE: 0.82 mg/dL (ref 0.50–1.05)
Calcium: 8.9 mg/dL (ref 8.6–10.4)
Chloride: 105 mmol/L (ref 98–110)
GFR, Est Non African American: 81 mL/min (ref >=60)
GLUCOSE: 112 mg/dL — AB (ref 65–99)
Potassium: 4.1 mmol/L (ref 3.5–5.3)
SODIUM: 137 mmol/L (ref 135–146)
Total Protein: 7.3 g/dL (ref 6.1–8.1)

## 2016-04-04 LAB — TSH: TSH: 1.13 mIU/L

## 2016-04-04 MED ORDER — LOSARTAN POTASSIUM 100 MG PO TABS: 100.0000 mg | ORAL_TABLET | Freq: Every day | ORAL | 3 refills | Status: DC

## 2016-04-04 MED ORDER — INSULIN DEGLUDEC-LIRAGLUTIDE 100-3.6 UNIT-MG/ML ~~LOC~~ SOPN: 15.0000 [IU] | PEN_INJECTOR | Freq: Every day | SUBCUTANEOUS | 6 refills | Status: DC

## 2016-04-04 NOTE — Assessment & Plan Note (Signed)
Check labs today.

## 2016-04-04 NOTE — Assessment & Plan Note (Signed)
Weight gain noted; check TSH and adjust medicine if needed; see AVS

## 2016-04-04 NOTE — Progress Notes (Signed)
BP (!) 162/92 (BP Location: Right Arm)   Pulse 82   Temp 98.3 F (36.8 C) (Oral)   Wt 261 lb 1.6 oz (118.4 kg)   LMP  (LMP Unknown)   SpO2 95%   BMI 44.82 kg/m    Subjective:    Patient ID: Elizabeth Russo, female    DOB: [birth year], 56 y.o.   MRN: [id number]  HPI: Elizabeth Russo is a 56 y.o. female  Chief Complaint  Patient presents with  . Follow-up   Diabetes mellitus type 2 Checking sugars?  yes How often? Twice a week Range (low to high) over last two weeks:  116-180 Does patient feel additional teaching/training would be helpful?  no Trying to limit white bread, white rice, white potatoes, sweets?  yes Trying to limit sweetened drinks like iced tea, soft drinks, sports drinks, fruit juices?  yes Checking feet every day/night?  no Last eye exam:  Last year, goes every year Using insulin, 15 units daily No hx of thyroid cancer, no fam hx of MEN-2  Hypertension Patient has had hypertension since having children  Checking blood pressure away from here?  yes How often? Twice a month  Range (low to high) over last two weeks:  170 something / 80 something  Feels blood pressure is under poor control  Hypertension-associated complications:  none  If taking medicines, are you taking them regularly?  yes  Siblings / family history: Does high blood pressure run in your family?   YES  Salt:  Trying to limit sodium / salt when buying foods at the grocery store?  yes Do you try to limit added salt when cooking and at the table?  yes  Sweets/licorice:  Do you eat a lot of sweets or eat black licorice?  no; just sweets occasionally  Saturated fats: Do you eat a lot of foods like bacon, sausage, pepperoni, cheeseburgers, hot dogs, bologna, and cheese?  no ; little bit of bacon and pepperoni, more than before  Sedentary lifestyle:  Exercise/activity level:  very active (regular exercise five or more days per week)  Steroids/Non-steroidals:  Have you had a recent  cortisone shot in the last few months?  no  Do you take prednisone or prescription NSAIDs or take OTCS NSAIDs such as ibuprofen, Motrin, Advil, Aleve, or naproxen? no   Smoking: Do you smoke?  no  Snoring / sleep apnea: Do you snore or have sleep apnea?  YES If diagnosed with sleep apnea, do you use your machine?  n/a, not diagnosed yet  Stress: Do you feel like you are under excessive stress or that your stress level affects your blood pressure at times?    YES  Stroh's (alcohol): Do you drink alcohol  yes  --- if yes, do you drink more than 14 drinks/week if female or more than 7 drinks/week if female?  no  Sudafed (decongestants): Do you use any OTC decongestant products like Allegra-D, Claritin-D, Zyrtec-D, Tylenol Cold and Sinus, etc.?  no  Hypothyroidism Lab Results  Component Value Date   TSH 5.98 (H) 01/05/2016  not enough energy; BM daily at least once a day, occasional skip day  Depression screen [facility] 2/9 04/04/2016 01/05/2016 09/15/2015 06/23/2015 04/26/2015  Decreased Interest 0 0 0 0 0  Down, Depressed, Hopeless 0 0 0 0 0  PHQ - 2 Score 0 0 0 0 0   Relevant past medical, surgical, family and social history reviewed Past Medical History:  Diagnosis Date  . CAD (coronary  artery disease)   . Diabetes mellitus without complication ([place])   . Diverticulosis   . Hypertension   . Hypothyroid   . Morbid obesity ([place]) 06/26/2014  . Vertigo    Past Surgical History:  Procedure Laterality Date  . ABDOMINAL HYSTERECTOMY    . heart stent  2012   Family History  Problem Relation Age of Onset  . Heart disease Mother   . Diabetes Mother   . Hypertension Mother   . Hyperlipidemia Mother   . Thyroid disease Mother   . Cancer Father     colon  . Diabetes Father   . Hypertension Father   . Diabetes Sister   . Hypertension Sister   . Heart disease Sister   . Hyperlipidemia Sister   . Kidney disease Sister   . Cancer Maternal Grandfather     lung  . Heart attack Paternal  Grandfather   . Heart disease Sister   . Diabetes Sister   . Heart disease Sister   . Arthritis Sister    Social History  Substance Use Topics  . Smoking status: Former Smoker    Quit date: 06/26/2011  . Smokeless tobacco: Never Used  . Alcohol use No   Interim medical history since last visit reviewed. Allergies and medications reviewed  Review of Systems  Constitutional: Positive for unexpected weight change (weight gain).  Respiratory: Positive for shortness of breath (with exertion).   Cardiovascular: Negative for chest pain.   Per HPI unless specifically indicated above     Objective:    BP (!) 162/92 (BP Location: Right Arm)   Pulse 82   Temp 98.3 F (36.8 C) (Oral)   Wt 261 lb 1.6 oz (118.4 kg)   LMP  (LMP Unknown)   SpO2 95%   BMI 44.82 kg/m   Wt Readings from Last 3 Encounters:  04/04/16 261 lb 1.6 oz (118.4 kg)  01/05/16 258 lb 5 oz (117.2 kg)  10/06/15 254 lb (115.2 kg)    Today's Vitals   04/04/16 0850 04/04/16 0903  BP: (!) 178/90 (!) 162/92  Pulse: 68 82  Temp: 98.3 F (36.8 C)   TempSrc: Oral   SpO2: 95%   Weight: 261 lb 1.6 oz (118.4 kg)    Physical Exam  Constitutional: She appears well-developed and well-nourished. No distress.  Morbidly obese  HENT:  Head: Normocephalic and atraumatic.  Right Ear: Hearing, tympanic membrane, external ear and ear canal normal.  Left Ear: Hearing, tympanic membrane, external ear and ear canal normal.  Nose: No rhinorrhea.  Mouth/Throat: Oropharynx is clear and moist.  Eyes: EOM are normal. No scleral icterus.  Neck: No thyromegaly present.  Cardiovascular: Normal rate, regular rhythm and normal heart sounds.   No murmur heard. Pulmonary/Chest: Effort normal and breath sounds normal. No respiratory distress. She has no wheezes.  Abdominal: Soft. Bowel sounds are normal. She exhibits no distension.  Musculoskeletal: Normal range of motion. She exhibits no edema.  Neurological: She is alert. She exhibits  normal muscle tone.  Skin: Skin is warm and dry. She is not diaphoretic. No pallor.  Psychiatric: She has a normal mood and affect. Her behavior is normal. Judgment and thought content normal.   Diabetic Foot Form - Detailed   Diabetic Foot Exam - detailed Diabetic Foot exam was performed with the following findings:  Yes 04/04/2016  9:39 AM  Visual Foot Exam completed.:  Yes  Are the toenails ingrown?:  No Normal Range of Motion:  Yes Right Dorsalis Pedis:  Present Left Dorsalis Pedis:  Present  Sensory Foot Exam Completed.:  Yes Swelling:  No Semmes-Weinstein Monofilament Test R Site 1-Great Toe:  Pos L Site 1-Great Toe:  Pos  R Site 4:  Pos L Site 4:  Pos  R Site 5:  Pos L Site 5:  Pos       Results for orders placed or performed in visit on 01/05/16  Lipid panel  Result Value Ref Range   Cholesterol 155 <200 mg/dL   Triglycerides 112 <150 mg/dL   HDL 35 (L) >50 mg/dL   Total Russo/HDL Ratio 4.4 <5.0 Ratio   VLDL 22 <30 mg/dL   LDL Cholesterol 98 <100 mg/dL  CBC with Differential/Platelet  Result Value Ref Range   WBC 6.6 3.8 - 10.8 K/uL   RBC 5.02 3.80 - 5.10 MIL/uL   Hemoglobin 14.3 11.7 - 15.5 g/dL   HCT 43.4 35.0 - 45.0 %   MCV 86.5 80.0 - 100.0 fL   MCH 28.5 27.0 - 33.0 pg   MCHC 32.9 32.0 - 36.0 g/dL   RDW 14.1 11.0 - 15.0 %   Platelets 218 140 - 400 K/uL   MPV 8.6 7.5 - 12.5 fL   Neutro Abs 2,574 1,500 - 7,800 cells/uL   Lymphs Abs 3,036 850 - 3,900 cells/uL   Monocytes Absolute 660 200 - 950 cells/uL   Eosinophils Absolute 264 15 - 500 cells/uL   Basophils Absolute 66 0 - 200 cells/uL   Neutrophils Relative % 39 %   Lymphocytes Relative 46 %   Monocytes Relative 10 %   Eosinophils Relative 4 %   Basophils Relative 1 %   Smear Review Criteria for review not met   TSH  Result Value Ref Range   TSH 5.98 (H) mIU/L  COMPLETE METABOLIC PANEL WITH GFR  Result Value Ref Range   Sodium 139 135 - 146 mmol/L   Potassium 4.0 3.5 - 5.3 mmol/L   Chloride 106 98 -  110 mmol/L   CO2 27 20 - 31 mmol/L   Glucose, Bld 114 (H) 65 - 99 mg/dL   BUN 10 7 - 25 mg/dL   Creat 0.83 0.50 - 1.05 mg/dL   Total Bilirubin 0.3 0.2 - 1.2 mg/dL   Alkaline Phosphatase 81 33 - 130 U/L   AST 21 10 - 35 U/L   ALT 26 6 - 29 U/L   Total Protein 7.0 6.1 - 8.1 g/dL   Albumin 3.8 3.6 - 5.1 g/dL   Calcium 8.6 8.6 - 10.4 mg/dL   GFR, Est African American >89 >=60 mL/min   GFR, Est Non African American 80 >=60 mL/min  B Nat Peptide  Result Value Ref Range   Brain Natriuretic Peptide 31.0 <100 pg/mL  Uric acid  Result Value Ref Range   Uric Acid, Serum 6.0 2.5 - 7.0 mg/dL  POCT HgB A1C  Result Value Ref Range   Hemoglobin A1C 6.4       Assessment & Plan:   Problem List Items Addressed This Visit      Cardiovascular and Mediastinum   Essential hypertension, benign (Chronic)    Not controlled today; DASH guidelines; close f/u; weight loss key      Relevant Medications   losartan (COZAAR) 100 MG tablet     Endocrine   Well controlled type 2 diabetes mellitus ([place]) - Primary (Chronic)    Foot exam; check A1c; weight loss is key      Relevant Medications   losartan (COZAAR) 100  MG tablet   Insulin Degludec-Liraglutide (XULTOPHY) 100-3.6 UNIT-MG/ML SOPN   Other Relevant Orders   Hemoglobin A1c   Lipid panel   Microalbumin / creatinine urine ratio   Hypothyroidism (Chronic)    Check TSH today, adjust medicine if needed      Relevant Orders   TSH     Other   Morbid obesity ([facility]) (Chronic)    Weight gain noted; check TSH and adjust medicine if needed; see AVS      Relevant Medications   Insulin Degludec-Liraglutide (XULTOPHY) 100-3.6 UNIT-MG/ML SOPN   Medication monitoring encounter    Check labs today      Relevant Orders   COMPLETE METABOLIC PANEL WITH GFR   Hyperlipidemia    Check lipids today; limit saturated fats      Relevant Medications   losartan (COZAAR) 100 MG tablet   Other Relevant Orders   Lipid panel    Other Visit Diagnoses      Snoring       with uncontrolled HTN, morbid obesity; risk for OSA; refer to pulm for evaluation   Relevant Orders   Ambulatory referral to Pulmonology      Follow up plan: Return for BP check and BMP in 10-14 days; 3 months with Dr. Sanda Klein.  An after-visit summary was printed and given to the patient at [facility].  Please see the patient instructions which may contain other information and recommendations beyond what is mentioned above in the assessment and plan.  Meds ordered this encounter  Medications  . losartan (COZAAR) 100 MG tablet    Sig: Take 1 tablet (100 mg total) by mouth daily.    Dispense:  90 tablet    Refill:  3    New higher dose  . Insulin Degludec-Liraglutide (XULTOPHY) 100-3.6 UNIT-MG/ML SOPN    Sig: Inject 15 Units into the skin daily.    Dispense:  5 pen    Refill:  6  switch from [place] to [place] This Encounter  Procedures  . COMPLETE METABOLIC PANEL WITH GFR  . Hemoglobin A1c  . Lipid panel  . Microalbumin / creatinine urine ratio  . TSH  . Ambulatory referral to Pulmonology

## 2016-04-04 NOTE — Assessment & Plan Note (Signed)
Not controlled today; DASH guidelines; close f/u; weight loss key

## 2016-04-04 NOTE — Assessment & Plan Note (Signed)
Foot exam; check A1c; weight loss is key

## 2016-04-04 NOTE — Patient Instructions (Addendum)
Increase losartan from 50 mg daily to 100 mg daily Return in 10-14 days for recheck and BMP  Check out the information at familydoctor.org entitled "Nutrition for Weight Loss: What You Need to Know about Fad Diets" Try to lose between 1-2 pounds per week by taking in fewer calories and burning off more calories You can succeed by limiting portions, limiting foods dense in calories and fat, becoming more active, and drinking 8 glasses of water a day (64 ounces) Don't skip meals, especially breakfast, as skipping meals may alter your metabolism Do not use over-the-counter weight loss pills or gimmicks that claim rapid weight loss A healthy BMI (or body mass index) is between 18.5 and 24.9 You can calculate your ideal BMI at the [place] website ClubMonetize.fr  Your goal blood pressure is less than 130 mmHg on top. Try to follow the DASH guidelines (DASH stands for Dietary Approaches to Stop Hypertension) Try to limit the sodium in your diet.  Ideally, consume less than 1.5 grams (less than 1,500mg ) per day. Do not add salt when cooking or at the table.  Check the sodium amount on labels when shopping, and choose items lower in sodium when given a choice. Avoid or limit foods that already contain a lot of sodium. Eat a diet rich in fruits and vegetables and whole grains.   DASH Eating Plan DASH stands for "Dietary Approaches to Stop Hypertension." The DASH eating plan is a healthy eating plan that has been shown to reduce high blood pressure (hypertension). It may also reduce your risk for type 2 diabetes, heart disease, and stroke. The DASH eating plan may also help with weight loss. What are tips for following this plan? General guidelines   Avoid eating more than 2,300 mg (milligrams) of salt (sodium) a day. If you have hypertension, you may need to reduce your sodium intake to 1,500 mg a day.  Limit alcohol intake to no more than 1 drink a day  for nonpregnant women and 2 drinks a day for men. One drink equals 12 oz of beer, 5 oz of wine, or 1 oz of hard liquor.  Work with your health care provider to maintain a healthy body weight or to lose weight. Ask what an ideal weight is for you.  Get at least 30 minutes of exercise that causes your heart to beat faster (aerobic exercise) most days of the week. Activities may include walking, swimming, or biking.  Work with your health care provider or diet and nutrition specialist (dietitian) to adjust your eating plan to your individual calorie needs. Reading food labels   Check food labels for the amount of sodium per serving. Choose foods with less than 5 percent of the Daily Value of sodium. Generally, foods with less than 300 mg of sodium per serving fit into this eating plan.  To find whole grains, look for the word "whole" as the first word in the ingredient list. Shopping   Buy products labeled as "low-sodium" or "no salt added."  Buy fresh foods. Avoid canned foods and premade or frozen meals. Cooking   Avoid adding salt when cooking. Use salt-free seasonings or herbs instead of table salt or sea salt. Check with your health care provider or pharmacist before using salt substitutes.  Do not fry foods. Cook foods using healthy methods such as baking, boiling, grilling, and broiling instead.  Cook with heart-healthy oils, such as olive, canola, soybean, or sunflower oil. Meal planning    Eat a balanced diet that  includes:  5 or more servings of fruits and vegetables each day. At each meal, try to fill half of your plate with fruits and vegetables.  Up to 6-8 servings of whole grains each day.  Less than 6 oz of lean meat, poultry, or fish each day. A 3-oz serving of meat is about the same size as a deck of cards. One egg equals 1 oz.  2 servings of low-fat dairy each day.  A serving of nuts, seeds, or beans 5 times each week.  Heart-healthy fats. Healthy fats called  Omega-3 fatty acids are found in foods such as flaxseeds and coldwater fish, like sardines, salmon, and mackerel.  Limit how much you eat of the following:  Canned or prepackaged foods.  Food that is high in trans fat, such as fried foods.  Food that is high in saturated fat, such as fatty meat.  Sweets, desserts, sugary drinks, and other foods with added sugar.  Full-fat dairy products.  Do not salt foods before eating.  Try to eat at least 2 vegetarian meals each week.  Eat more home-cooked food and less restaurant, buffet, and fast food.  When eating at a restaurant, ask that your food be prepared with less salt or no salt, if possible. What foods are recommended? The items listed may not be a complete list. Talk with your dietitian about what dietary choices are best for you. Grains  Whole-grain or whole-wheat bread. Whole-grain or whole-wheat pasta. Brown rice. [place]. Bulgur. Whole-grain and low-sodium cereals. Pita bread. Low-fat, low-sodium crackers. Whole-wheat flour tortillas. Vegetables  Fresh or frozen vegetables (raw, steamed, roasted, or grilled). Low-sodium or reduced-sodium tomato and vegetable juice. Low-sodium or reduced-sodium tomato sauce and tomato paste. Low-sodium or reduced-sodium canned vegetables. Fruits  All fresh, dried, or frozen fruit. Canned fruit in natural juice (without added sugar). Meat and other protein foods  Skinless chicken or [place]. Ground chicken or [place]. Pork with fat trimmed off. Fish and seafood. Egg whites. Dried beans, peas, or lentils. Unsalted nuts, nut butters, and seeds. Unsalted canned beans. Lean cuts of beef with fat trimmed off. Low-sodium, lean deli meat. Dairy  Low-fat (1%) or fat-free (skim) milk. Fat-free, low-fat, or reduced-fat cheeses. Nonfat, low-sodium ricotta or cottage cheese. Low-fat or nonfat yogurt. Low-fat, low-sodium cheese. Fats and oils  Soft margarine without trans fats. Vegetable oil. Low-fat,  reduced-fat, or light mayonnaise and salad dressings (reduced-sodium). Canola, safflower, olive, soybean, and sunflower oils. Avocado. Seasoning and other foods  Herbs. Spices. Seasoning mixes without salt. Unsalted popcorn and pretzels. Fat-free sweets. What foods are not recommended? The items listed may not be a complete list. Talk with your dietitian about what dietary choices are best for you. Grains  Baked goods made with fat, such as croissants, muffins, or some breads. Dry pasta or rice meal packs. Vegetables  Creamed or fried vegetables. Vegetables in a cheese sauce. Regular canned vegetables (not low-sodium or reduced-sodium). Regular canned tomato sauce and paste (not low-sodium or reduced-sodium). Regular tomato and vegetable juice (not low-sodium or reduced-sodium). Angie Fava. Olives. Fruits  Canned fruit in a light or heavy syrup. Fried fruit. Fruit in cream or butter sauce. Meat and other protein foods  Fatty cuts of meat. Ribs. Fried meat. Berniece Salines. Sausage. Bologna and other processed lunch meats. Salami. Fatback. Hotdogs. Bratwurst. Salted nuts and seeds. Canned beans with added salt. Canned or smoked fish. Whole eggs or egg yolks. Chicken or [place] with skin. Dairy  Whole or 2% milk, cream, and half-and-half. Whole or  full-fat cream cheese. Whole-fat or sweetened yogurt. Full-fat cheese. Nondairy creamers. Whipped toppings. Processed cheese and cheese spreads. Fats and oils  Butter. Stick margarine. Lard. Shortening. Ghee. Bacon fat. Tropical oils, such as coconut, palm kernel, or palm oil. Seasoning and other foods  Salted popcorn and pretzels. Onion salt, garlic salt, seasoned salt, table salt, and sea salt. Worcestershire sauce. Tartar sauce. Barbecue sauce. Teriyaki sauce. Soy sauce, including reduced-sodium. Steak sauce. Canned and packaged gravies. Fish sauce. Oyster sauce. Cocktail sauce. Horseradish that you find on the shelf. Ketchup. Mustard. Meat flavorings and tenderizers.  Bouillon cubes. Hot sauce and Tabasco sauce. Premade or packaged marinades. Premade or packaged taco seasonings. Relishes. Regular salad dressings. Where to find more information:  National Heart, Lung, and [place]: https://wilson-eaton.com/  American Heart Association: www.heart.org Summary  The DASH eating plan is a healthy eating plan that has been shown to reduce high blood pressure (hypertension). It may also reduce your risk for type 2 diabetes, heart disease, and stroke.  With the DASH eating plan, you should limit salt (sodium) intake to 2,300 mg a day. If you have hypertension, you may need to reduce your sodium intake to 1,500 mg a day.  When on the DASH eating plan, aim to eat more fresh fruits and vegetables, whole grains, lean proteins, low-fat dairy, and heart-healthy fats.  Work with your health care provider or diet and nutrition specialist (dietitian) to adjust your eating plan to your individual calorie needs. This information is not intended to replace advice given to you by your health care provider. Make sure you discuss any questions you have with your health care provider. Document Released: 12/15/2010 Document Revised: 12/20/2015 Document Reviewed: 12/20/2015 Elsevier Interactive Patient Education  2017 [employer].

## 2016-04-04 NOTE — Assessment & Plan Note (Signed)
Check lipids today; limit saturated fats 

## 2016-04-04 NOTE — Assessment & Plan Note (Signed)
Check TSH today, adjust medicine if needed

## 2016-04-05 LAB — HEMOGLOBIN A1C
Hgb A1c MFr Bld: 6.2 % — ABNORMAL HIGH (ref <5.7)
Mean Plasma Glucose: 131 mg/dL

## 2016-04-05 LAB — MICROALBUMIN / CREATININE URINE RATIO
CREATININE, URINE: 30 mg/dL (ref 20–320)
MICROALB UR: 0.2 mg/dL
MICROALB/CREAT RATIO: 7 ug/mg{creat} (ref <30)

## 2016-04-20 ENCOUNTER — Ambulatory Visit: Payer: BC Managed Care – PPO

## 2016-04-20 ENCOUNTER — Other Ambulatory Visit: Payer: Self-pay | Admitting: Family Medicine

## 2016-04-20 VITALS — BP 168/94 | HR 88

## 2016-04-20 DIAGNOSIS — I10 Essential (primary) hypertension: Secondary | ICD-10-CM

## 2016-04-20 LAB — BASIC METABOLIC PANEL WITH GFR
BUN: 9 mg/dL (ref 7–25)
CALCIUM: 8.6 mg/dL (ref 8.6–10.4)
CHLORIDE: 107 mmol/L (ref 98–110)
CO2: 26 mmol/L (ref 20–31)
CREATININE: 0.81 mg/dL (ref 0.50–1.05)
GFR, Est Non African American: 82 mL/min (ref >=60)
Glucose, Bld: 100 mg/dL — ABNORMAL HIGH (ref 65–99)
Potassium: 3.8 mmol/L (ref 3.5–5.3)
SODIUM: 139 mmol/L (ref 135–146)

## 2016-04-20 MED ORDER — CARVEDILOL 25 MG PO TABS: 25.0000 mg | ORAL_TABLET | Freq: Two times a day (BID) | ORAL | 3 refills | Status: DC

## 2016-04-20 NOTE — Progress Notes (Signed)
Increase coreg; recheck BP and pulse in 1 weeks

## 2016-04-28 ENCOUNTER — Ambulatory Visit: Payer: BC Managed Care – PPO

## 2016-04-28 VITALS — BP 134/78 | HR 64

## 2016-04-28 DIAGNOSIS — I10 Essential (primary) hypertension: Secondary | ICD-10-CM

## 2016-04-28 LAB — BASIC METABOLIC PANEL
BUN: 11 mg/dL (ref 7–25)
CALCIUM: 9.2 mg/dL (ref 8.6–10.4)
CO2: 26 mmol/L (ref 20–31)
CREATININE: 0.86 mg/dL (ref 0.50–1.05)
Chloride: 106 mmol/L (ref 98–110)
GLUCOSE: 108 mg/dL — AB (ref 65–99)
POTASSIUM: 4 mmol/L (ref 3.5–5.3)
Sodium: 139 mmol/L (ref 135–146)

## 2016-05-11 ENCOUNTER — Encounter: Payer: Self-pay | Admitting: Family Medicine

## 2016-05-11 ENCOUNTER — Ambulatory Visit (INDEPENDENT_AMBULATORY_CARE_PROVIDER_SITE_OTHER): Payer: BC Managed Care – PPO | Admitting: Family Medicine

## 2016-05-11 VITALS — BP 132/74 | HR 70 | Temp 98.3°F | Resp 14 | Wt 255.8 lb

## 2016-05-11 DIAGNOSIS — R1030 Lower abdominal pain, unspecified: Secondary | ICD-10-CM | POA: Diagnosis not present

## 2016-05-11 DIAGNOSIS — N3001 Acute cystitis with hematuria: Secondary | ICD-10-CM

## 2016-05-11 LAB — POCT URINALYSIS DIPSTICK
Bilirubin, UA: NEGATIVE
Glucose, UA: NEGATIVE
NITRITE UA: NEGATIVE
PH UA: 5 (ref 5.0–8.0)
Protein, UA: NEGATIVE
Spec Grav, UA: 1.025 (ref 1.010–1.025)
UROBILINOGEN UA: 0.2 U/dL

## 2016-05-11 MED ORDER — NITROFURANTOIN MONOHYD MACRO 100 MG PO CAPS: 100.0000 mg | ORAL_CAPSULE | Freq: Two times a day (BID) | ORAL | 0 refills | Status: DC

## 2016-05-11 NOTE — Progress Notes (Signed)
BP 132/74   Pulse 70   Temp 98.3 F (36.8 C) (Oral)   Resp 14   Wt 255 lb 12.8 oz (116 kg)   LMP  (LMP Unknown)   SpO2 95%   BMI 43.91 kg/m    Subjective:    Patient ID: Elizabeth Russo, female    DOB: Sep 08, 1960, 56 y.o.   MRN: [id number]  HPI: Elizabeth Russo is a 56 y.o. female  Chief Complaint  Patient presents with  . Abdominal Pain    lower abdomen,nausea    Abdominal Pain  This is a new problem. The current episode started in the past 7 days. The onset quality is sudden. The problem occurs 2 to 4 times per day. The problem has been gradually improving. The pain is located in the suprapubic region. The pain is mild. The abdominal pain does not radiate. Associated symptoms include nausea. Pertinent negatives include no fever or vomiting. Associated symptoms comments: Strong odor to the urine. Exacerbated by: drinking more water.  Urinary Tract Infection   Associated symptoms include nausea. Pertinent negatives include no vomiting.   Depression screen [facility] 2/9 05/11/2016 04/04/2016 01/05/2016 09/15/2015 06/23/2015  Decreased Interest 0 0 0 0 0  Down, Depressed, Hopeless 0 0 0 0 0  PHQ - 2 Score 0 0 0 0 0   Relevant past medical, surgical, family and social history reviewed Past Medical History:  Diagnosis Date  . CAD (coronary artery disease)   . Diabetes mellitus without complication ([place])   . Diverticulosis   . Hypertension   . Hypothyroid   . Morbid obesity ([place]) 06/26/2014  . Vertigo    Past Surgical History:  Procedure Laterality Date  . ABDOMINAL HYSTERECTOMY    . heart stent  2012   family history includes Arthritis in her sister; Cancer in her father and maternal grandfather; Diabetes in her father, mother, sister, and sister; Heart attack in her paternal grandfather; Heart disease in her mother, sister, sister, and sister; Hyperlipidemia in her mother and sister; Hypertension in her father, mother, and sister; Kidney disease in her sister; Thyroid disease in  her mother. Social History   Social History  . Marital status: Married    Spouse name: N/A  . Number of children: N/A  . Years of education: N/A   Occupational History  . Not on file.   Social History Main Topics  . Smoking status: Former Smoker    Quit date: 06/26/2011  . Smokeless tobacco: Never Used  . Alcohol use No  . Drug use: No  . Sexual activity: Not on file   Other Topics Concern  . Not on file   Social History Narrative  . No narrative on file   Interim medical history since last visit reviewed. Allergies and medications reviewed  Review of Systems  Constitutional: Negative for fever.  Gastrointestinal: Positive for abdominal pain and nausea. Negative for vomiting.   Per HPI unless specifically indicated above     Objective:    BP 132/74   Pulse 70   Temp 98.3 F (36.8 C) (Oral)   Resp 14   Wt 255 lb 12.8 oz (116 kg)   LMP  (LMP Unknown)   SpO2 95%   BMI 43.91 kg/m   Wt Readings from Last 3 Encounters:  05/11/16 255 lb 12.8 oz (116 kg)  04/04/16 261 lb 1.6 oz (118.4 kg)  01/05/16 258 lb 5 oz (117.2 kg)    Physical Exam  Constitutional: She appears well-developed and  well-nourished.  HENT:  Mouth/Throat: Mucous membranes are normal.  Eyes: EOM are normal. No scleral icterus.  Cardiovascular: Normal rate and regular rhythm.   Pulmonary/Chest: Effort normal and breath sounds normal.  Abdominal: There is tenderness in the suprapubic area. There is no CVA tenderness.  Skin: She is not diaphoretic. No pallor.  Psychiatric: She has a normal mood and affect. Her behavior is normal.   Diabetic Foot Form - Detailed   Diabetic Foot Exam - detailed Diabetic Foot exam was performed with the following findings:  Yes 05/13/2016 10:19 AM  Visual Foot Exam completed.:  Yes  Are the toenails ingrown?:  No Normal Range of Motion:  Yes Pulse Foot Exam completed.:  Yes  Right Dorsalis Pedis:  Present Left Dorsalis Pedis:  Present  Sensory Foot Exam  Completed.:  Yes Swelling:  No Semmes-Weinstein Monofilament Test R Site 1-Great Toe:  Pos L Site 1-Great Toe:  Pos  R Site 4:  Pos L Site 4:  Pos  R Site 5:  Pos L Site 5:  Pos          Assessment & Plan:   Problem List Items Addressed This Visit    None    Visit Diagnoses    Lower abdominal pain    -  Primary   discussed ddx; will treat for presumptive UTI; call or seek care if worsening   Relevant Orders   POCT urinalysis dipstick (Completed)   Acute cystitis with hematuria       urine reviewed; suspicious for acute cystitis; will want to recheck her urine in 2-3 weeks   Relevant Orders   Urine culture (Completed)   Urinalysis w microscopic + reflex cultur       Follow up plan: Return in about 3 weeks (around 06/01/2016) for urine specimen only.  An after-visit summary was printed and given to the patient at [place].  Please see the patient instructions which may contain other information and recommendations beyond what is mentioned above in the assessment and plan.  Meds ordered this encounter  Medications  . nitrofurantoin, macrocrystal-monohydrate, (MACROBID) 100 MG capsule    Sig: Take 1 capsule (100 mg total) by mouth 2 (two) times daily.    Dispense:  14 capsule    Refill:  0    Orders Placed This Encounter  Procedures  . Urine culture  . Urinalysis w microscopic + reflex cultur  . POCT urinalysis dipstick

## 2016-05-11 NOTE — Patient Instructions (Addendum)
Start the new antibiotic Return just for a urine specimen in about 3 weeks   Urinary Tract Infection, Adult A urinary tract infection (UTI) is an infection of any part of the urinary tract, which includes the kidneys, ureters, bladder, and urethra. These organs make, store, and get rid of urine in the body. UTI can be a bladder infection (cystitis) or kidney infection (pyelonephritis). What are the causes? This infection may be caused by fungi, viruses, or bacteria. Bacteria are the most common cause of UTIs. This condition can also be caused by repeated incomplete emptying of the bladder during urination. What increases the risk? This condition is more likely to develop if:  You ignore your need to urinate or hold urine for long periods of time.  You do not empty your bladder completely during urination.  You wipe back to front after urinating or having a bowel movement, if you are female.  You are uncircumcised, if you are female.  You are constipated.  You have a urinary catheter that stays in place (indwelling).  You have a weak defense (immune) system.  You have a medical condition that affects your bowels, kidneys, or bladder.  You have diabetes.  You take antibiotic medicines frequently or for long periods of time, and the antibiotics no longer work well against certain types of infections (antibiotic resistance).  You take medicines that irritate your urinary tract.  You are exposed to chemicals that irritate your urinary tract.  You are female. What are the signs or symptoms? Symptoms of this condition include:  Fever.  Frequent urination or passing small amounts of urine frequently.  Needing to urinate urgently.  Pain or burning with urination.  Urine that smells bad or unusual.  Cloudy urine.  Pain in the lower abdomen or back.  Trouble urinating.  Blood in the urine.  Vomiting or being less hungry than normal.  Diarrhea or abdominal  pain.  Vaginal discharge, if you are female. How is this diagnosed? This condition is diagnosed with a medical history and physical exam. You will also need to provide a urine sample to test your urine. Other tests may be done, including:  Blood tests.  Sexually transmitted disease (STD) testing. If you have had more than one UTI, a cystoscopy or imaging studies may be done to determine the cause of the infections. How is this treated? Treatment for this condition often includes a combination of two or more of the following:  Antibiotic medicine.  Other medicines to treat less common causes of UTI.  Over-the-counter medicines to treat pain.  Drinking enough water to stay hydrated. Follow these instructions at home:  Take over-the-counter and prescription medicines only as told by your health care provider.  If you were prescribed an antibiotic, take it as told by your health care provider. Do not stop taking the antibiotic even if you start to feel better.  Avoid alcohol, caffeine, tea, and carbonated beverages. They can irritate your bladder.  Drink enough fluid to keep your urine clear or pale yellow.  Keep all follow-up visits as told by your health care provider. This is important.  Make sure to:  Empty your bladder often and completely. Do not hold urine for long periods of time.  Empty your bladder before and after sex.  Wipe from front to back after a bowel movement if you are female. Use each tissue one time when you wipe. Contact a health care provider if:  You have back pain.  You have a  fever.  You feel nauseous or vomit.  Your symptoms do not get better after 3 days.  Your symptoms go away and then return. Get help right away if:  You have severe back pain or lower abdominal pain.  You are vomiting and cannot keep down any medicines or water. This information is not intended to replace advice given to you by your health care provider. Make sure you  discuss any questions you have with your health care provider. Document Released: 10/05/2004 Document Revised: 06/09/2015 Document Reviewed: 11/16/2014 Elsevier Interactive Patient Education  2017 [employer].

## 2016-05-13 LAB — URINE CULTURE

## 2016-05-15 ENCOUNTER — Telehealth: Payer: Self-pay

## 2016-05-15 NOTE — Telephone Encounter (Signed)
Tried calling the pt to go over labs. No answer. Kept ringing not able to leave a voicemail.

## 2016-05-15 NOTE — Telephone Encounter (Signed)
-----   Message from Arnetha Courser, MD sent at 05/13/2016 10:08 AM EDT ----- Please see how patient is feeling. Her urine culture did not grow out any overwhelming infection. If she's feeling better, that's good news. If not, let me know. Thank you

## 2016-06-01 ENCOUNTER — Other Ambulatory Visit: Payer: BC Managed Care – PPO

## 2016-06-02 ENCOUNTER — Other Ambulatory Visit: Payer: BC Managed Care – PPO

## 2016-06-06 ENCOUNTER — Other Ambulatory Visit (INDEPENDENT_AMBULATORY_CARE_PROVIDER_SITE_OTHER): Payer: BC Managed Care – PPO

## 2016-06-07 LAB — URINALYSIS W MICROSCOPIC + REFLEX CULTURE
BILIRUBIN URINE: NEGATIVE
Bacteria, UA: NONE SEEN [HPF]
CRYSTALS: NONE SEEN [HPF]
Casts: NONE SEEN [LPF]
Glucose, UA: NEGATIVE
HGB URINE DIPSTICK: NEGATIVE
KETONES UR: NEGATIVE
NITRITE: NEGATIVE
PH: 5.5 (ref 5.0–8.0)
Protein, ur: NEGATIVE
Specific Gravity, Urine: 1.015 (ref 1.001–1.035)
Yeast: NONE SEEN [HPF]

## 2016-06-08 LAB — URINE CULTURE: ORGANISM ID, BACTERIA: NO GROWTH

## 2016-07-11 ENCOUNTER — Ambulatory Visit (INDEPENDENT_AMBULATORY_CARE_PROVIDER_SITE_OTHER): Payer: BC Managed Care – PPO | Admitting: Family Medicine

## 2016-07-11 ENCOUNTER — Encounter: Payer: Self-pay | Admitting: Family Medicine

## 2016-07-11 DIAGNOSIS — Z5181 Encounter for therapeutic drug level monitoring: Secondary | ICD-10-CM

## 2016-07-11 DIAGNOSIS — E782 Mixed hyperlipidemia: Secondary | ICD-10-CM | POA: Diagnosis not present

## 2016-07-11 DIAGNOSIS — E119 Type 2 diabetes mellitus without complications: Secondary | ICD-10-CM | POA: Diagnosis not present

## 2016-07-11 DIAGNOSIS — E039 Hypothyroidism, unspecified: Secondary | ICD-10-CM

## 2016-07-11 DIAGNOSIS — I10 Essential (primary) hypertension: Secondary | ICD-10-CM | POA: Diagnosis not present

## 2016-07-11 NOTE — Assessment & Plan Note (Signed)
Praise given for further weight loss; encouraged safe gradual weight loss; see AVS

## 2016-07-11 NOTE — Assessment & Plan Note (Signed)
Discussed goal; patient opted to monitor BP and call me if not at goal rather than add more medicine or adjust today; weight loss, DASH guidelines, limit stress

## 2016-07-11 NOTE — Assessment & Plan Note (Signed)
Check sgpt and Cr 

## 2016-07-11 NOTE — Assessment & Plan Note (Signed)
A1c controlled; foot exam by MD today; patient was encouraged to call and schedule an eye exam; check A1c every 6 months if less than 7

## 2016-07-11 NOTE — Assessment & Plan Note (Signed)
LDL almost to goal; need to work on HDL, weight loss and increased activity; check every 6 months

## 2016-07-11 NOTE — Progress Notes (Signed)
BP (!) 136/92   Pulse 70   Temp 98.4 F (36.9 C) (Oral)   Resp 14   Wt 251 lb (113.9 kg)   LMP  (LMP Unknown)   SpO2 96%   BMI 43.08 kg/m    Subjective:    Patient ID: Elizabeth Russo, female    DOB: 1960-12-11, 56 y.o.   MRN: [id number]  HPI: Elizabeth Russo is a 56 y.o. female  Chief Complaint  Patient presents with  . Follow-up    3 months   HPI Patient is here for 3 month f/u  High blood pressure; little bit of salt; no decongestants; no NSAIDs; no black licorice; daughter stressed her this morning; she can check it at work  Type 2 diabetes Lab Results  Component Value Date   HGBA1C 6.2 (H) 04/04/2016  checked FSBS last night, 120 after eating watermelon; discussed salmonella warning Little bit of dry mouth; not much blurred vision; eye exam due  Hypothyroidism Lab Results  Component Value Date   TSH 1.13 04/04/2016  down almost 5 pounds since last visit; energy level is okay  High cholesterol Lab Results  Component Value Date   Russo 160 04/04/2016   HDL 37 (L) 04/04/2016   LDLCALC 102 (H) 04/04/2016   TRIG 104 04/04/2016   CHOLHDL 4.3 04/04/2016  too hot to walk outside; doing some yard work; staying hydrated Tries to limit bacon, 2 pieces a week  Depression screen [facility] 2/9 07/11/2016 05/11/2016 04/04/2016 01/05/2016 09/15/2015  Decreased Interest 0 0 0 0 0  Down, Depressed, Hopeless 0 0 0 0 0  PHQ - 2 Score 0 0 0 0 0    Relevant past medical, surgical, family and social history reviewed Past Medical History:  Diagnosis Date  . CAD (coronary artery disease)   . Diabetes mellitus without complication ([place])   . Diverticulosis   . Hypertension   . Hypothyroid   . Morbid obesity ([place]) 06/26/2014  . Vertigo    Past Surgical History:  Procedure Laterality Date  . ABDOMINAL HYSTERECTOMY    . heart stent  2012   Family History  Problem Relation Age of Onset  . Heart disease Mother   . Diabetes Mother   . Hypertension Mother   . Hyperlipidemia  Mother   . Thyroid disease Mother   . Cancer Father        colon  . Diabetes Father   . Hypertension Father   . Diabetes Sister   . Hypertension Sister   . Heart disease Sister   . Hyperlipidemia Sister   . Kidney disease Sister   . Cancer Maternal Grandfather        lung  . Heart attack Paternal Grandfather   . Heart disease Sister   . Diabetes Sister   . Heart disease Sister   . Arthritis Sister    Social History   Social History  . Marital status: Married    Spouse name: N/A  . Number of children: N/A  . Years of education: N/A   Occupational History  . Not on file.   Social History Main Topics  . Smoking status: Former Smoker    Quit date: 06/26/2011  . Smokeless tobacco: Never Used  . Alcohol use No  . Drug use: No  . Sexual activity: Not Currently   Other Topics Concern  . Not on file   Social History Narrative  . No narrative on file    Interim medical history since last visit  reviewed. Allergies and medications reviewed  Review of Systems Per HPI unless specifically indicated above     Objective:    BP (!) 136/92   Pulse 70   Temp 98.4 F (36.9 C) (Oral)   Resp 14   Wt 251 lb (113.9 kg)   LMP  (LMP Unknown)   SpO2 96%   BMI 43.08 kg/m   Wt Readings from Last 3 Encounters:  07/11/16 251 lb (113.9 kg)  05/11/16 255 lb 12.8 oz (116 kg)  04/04/16 261 lb 1.6 oz (118.4 kg)    Physical Exam  Constitutional: She appears well-developed and well-nourished. No distress.  Morbidly obese, but weight down 4+ pounds since last visit  HENT:  Head: Normocephalic and atraumatic.  Right Ear: Hearing and external ear normal.  Left Ear: Hearing and external ear normal.  Nose: No rhinorrhea.  Mouth/Throat: Oropharynx is clear and moist.  Eyes: EOM are normal. No scleral icterus.  Neck: No thyromegaly present.  Cardiovascular: Normal rate, regular rhythm and normal heart sounds.   No murmur heard. Pulmonary/Chest: Effort normal and breath sounds  normal. No respiratory distress. She has no wheezes.  Abdominal: Soft. Bowel sounds are normal. She exhibits no distension.  Musculoskeletal: Normal range of motion. She exhibits no edema.  Neurological: She is alert. She exhibits normal muscle tone.  Skin: Skin is warm and dry. She is not diaphoretic. No pallor.  Psychiatric: She has a normal mood and affect. Her behavior is normal. Judgment and thought content normal.   Diabetic Foot Form - Detailed   Diabetic Foot Exam - detailed Diabetic Foot exam was performed with the following findings:  Yes 07/11/2016 10:28 AM  Visual Foot Exam completed.:  Yes  Is there swelling or and abnormal foot shape?:  No Pulse Foot Exam completed.:  Yes  Right Dorsalis Pedis:  Present Left Dorsalis Pedis:  Present  Sensory Foot Exam Completed.:  Yes Semmes-Weinstein Monofilament Test R Site 1-Great Toe:  Pos L Site 1-Great Toe:  Pos        Results for orders placed or performed in visit on 06/06/16  Urine culture  Result Value Ref Range   Organism ID, Bacteria NO GROWTH   Urinalysis w microscopic + reflex cultur  Result Value Ref Range   Color, Urine YELLOW YELLOW   APPearance CLEAR CLEAR   Specific Gravity, Urine 1.015 1.001 - 1.035   pH 5.5 5.0 - 8.0   Glucose, UA NEGATIVE NEGATIVE   Bilirubin Urine NEGATIVE NEGATIVE   Ketones, ur NEGATIVE NEGATIVE   Hgb urine dipstick NEGATIVE NEGATIVE   Protein, ur NEGATIVE NEGATIVE   Nitrite NEGATIVE NEGATIVE   Leukocytes, UA TRACE (A) NEGATIVE   WBC, UA 0-5 <=5 WBC/HPF   RBC / HPF 0-2 <=2 RBC/HPF   Squamous Epithelial / LPF 0-5 <=5 HPF   Bacteria, UA NONE SEEN NONE SEEN HPF   Crystals NONE SEEN NONE SEEN HPF   Casts NONE SEEN NONE SEEN LPF   Yeast NONE SEEN NONE SEEN HPF  reviewed with patient; asx     Assessment & Plan:   Problem List Items Addressed This Visit      Cardiovascular and Mediastinum   Essential hypertension, benign (Chronic)    Discussed goal; patient opted to monitor BP and  call me if not at goal rather than add more medicine or adjust today; weight loss, DASH guidelines, limit stress        Endocrine   Well controlled type 2 diabetes mellitus ([place]) (Chronic)  A1c controlled; foot exam by MD today; patient was encouraged to call and schedule an eye exam; check A1c every 6 months if less than 7      Relevant Orders   Hemoglobin A1c   Hypothyroidism (Chronic)    Last thyroid normal; check yearly unless excessive change in weight or energy level        Other   Morbid obesity ([facility]) (Chronic)    Praise given for further weight loss; encouraged safe gradual weight loss; see AVS      Medication monitoring encounter    Check sgpt and Cr      Relevant Orders   COMPLETE METABOLIC PANEL WITH GFR   Hyperlipidemia    LDL almost to goal; need to work on HDL, weight loss and increased activity; check every 6 months      Relevant Orders   Lipid panel       Follow up plan: Return in about 3 months (around 10/11/2016) for twenty minute follow-up with fasting labs.  An after-visit summary was printed and given to the patient at [place].  Please see the patient instructions which may contain other information and recommendations beyond what is mentioned above in the assessment and plan.  No orders of the defined types were placed in this encounter.   Orders Placed This Encounter  Procedures  . COMPLETE METABOLIC PANEL WITH GFR  . Hemoglobin A1c  . Lipid panel

## 2016-07-11 NOTE — Patient Instructions (Addendum)
Please monitor your blood pressure at work and call me if not under 130 on top and under 80-85 on the bottom Try to follow the DASH guidelines (DASH stands for Dietary Approaches to Stop Hypertension) Try to limit the sodium in your diet.  Ideally, consume less than 1.5 grams (less than 1,500mg ) per day. Do not add salt when cooking or at the table.  Check the sodium amount on labels when shopping, and choose items lower in sodium when given a choice. Avoid or limit foods that already contain a lot of sodium. Eat a diet rich in fruits and vegetables and whole grains.  Recheck labs (fasting) on or after September 28th  Check out the information at familydoctor.org entitled "Nutrition for Weight Loss: What You Need to Know about Fad Diets" Try to lose between 1-2 pounds per week by taking in fewer calories and burning off more calories You can succeed by limiting portions, limiting foods dense in calories and fat, becoming more active, and drinking 8 glasses of water a day (64 ounces) Don't skip meals, especially breakfast, as skipping meals may alter your metabolism Do not use over-the-counter weight loss pills or gimmicks that claim rapid weight loss A healthy BMI (or body mass index) is between 18.5 and 24.9 You can calculate your ideal BMI at the [place] website ClubMonetize.fr Try switching from 2% to 1% milk or you can switch to no added sugar almond milk Let's aim for 6-12 pounds of weight loss before your next visit  [place] stands for "Dietary Approaches to Stop Hypertension." The DASH eating plan is a healthy eating plan that has been shown to reduce high blood pressure (hypertension). It may also reduce your risk for type 2 diabetes, heart disease, and stroke. The DASH eating plan may also help with weight loss. What are tips for following this plan? General guidelines  Avoid eating more than 2,300 mg (milligrams) of salt  (sodium) a day. If you have hypertension, you may need to reduce your sodium intake to 1,500 mg a day.  Limit alcohol intake to no more than 1 drink a day for nonpregnant women and 2 drinks a day for men. One drink equals 12 oz of beer, 5 oz of wine, or 1 oz of hard liquor.  Work with your health care provider to maintain a healthy body weight or to lose weight. Ask what an ideal weight is for you.  Get at least 30 minutes of exercise that causes your heart to beat faster (aerobic exercise) most days of the week. Activities may include walking, swimming, or biking.  Work with your health care provider or diet and nutrition specialist (dietitian) to adjust your eating plan to your individual calorie needs. Reading food labels  Check food labels for the amount of sodium per serving. Choose foods with less than 5 percent of the Daily Value of sodium. Generally, foods with less than 300 mg of sodium per serving fit into this eating plan.  To find whole grains, look for the word "whole" as the first word in the ingredient list. Shopping  Buy products labeled as "low-sodium" or "no salt added."  Buy fresh foods. Avoid canned foods and premade or frozen meals. Cooking  Avoid adding salt when cooking. Use salt-free seasonings or herbs instead of table salt or sea salt. Check with your health care provider or pharmacist before using salt substitutes.  Do not fry foods. Cook foods using healthy methods such as baking, boiling, grilling, and  broiling instead.  Cook with heart-healthy oils, such as olive, canola, soybean, or sunflower oil. Meal planning   Eat a balanced diet that includes: ? 5 or more servings of fruits and vegetables each day. At each meal, try to fill half of your plate with fruits and vegetables. ? Up to 6-8 servings of whole grains each day. ? Less than 6 oz of lean meat, poultry, or fish each day. A 3-oz serving of meat is about the same size as a deck of cards. One egg  equals 1 oz. ? 2 servings of low-fat dairy each day. ? A serving of nuts, seeds, or beans 5 times each week. ? Heart-healthy fats. Healthy fats called Omega-3 fatty acids are found in foods such as flaxseeds and coldwater fish, like sardines, salmon, and mackerel.  Limit how much you eat of the following: ? Canned or prepackaged foods. ? Food that is high in trans fat, such as fried foods. ? Food that is high in saturated fat, such as fatty meat. ? Sweets, desserts, sugary drinks, and other foods with added sugar. ? Full-fat dairy products.  Do not salt foods before eating.  Try to eat at least 2 vegetarian meals each week.  Eat more home-cooked food and less restaurant, buffet, and fast food.  When eating at a restaurant, ask that your food be prepared with less salt or no salt, if possible. What foods are recommended? The items listed may not be a complete list. Talk with your dietitian about what dietary choices are best for you. Grains Whole-grain or whole-wheat bread. Whole-grain or whole-wheat pasta. Brown rice. [place]. Bulgur. Whole-grain and low-sodium cereals. Pita bread. Low-fat, low-sodium crackers. Whole-wheat flour tortillas. Vegetables Fresh or frozen vegetables (raw, steamed, roasted, or grilled). Low-sodium or reduced-sodium tomato and vegetable juice. Low-sodium or reduced-sodium tomato sauce and tomato paste. Low-sodium or reduced-sodium canned vegetables. Fruits All fresh, dried, or frozen fruit. Canned fruit in natural juice (without added sugar). Meat and other protein foods Skinless chicken or [place]. Ground chicken or [place]. Pork with fat trimmed off. Fish and seafood. Egg whites. Dried beans, peas, or lentils. Unsalted nuts, nut butters, and seeds. Unsalted canned beans. Lean cuts of beef with fat trimmed off. Low-sodium, lean deli meat. Dairy Low-fat (1%) or fat-free (skim) milk. Fat-free, low-fat, or reduced-fat cheeses. Nonfat, low-sodium ricotta or  cottage cheese. Low-fat or nonfat yogurt. Low-fat, low-sodium cheese. Fats and oils Soft margarine without trans fats. Vegetable oil. Low-fat, reduced-fat, or light mayonnaise and salad dressings (reduced-sodium). Canola, safflower, olive, soybean, and sunflower oils. Avocado. Seasoning and other foods Herbs. Spices. Seasoning mixes without salt. Unsalted popcorn and pretzels. Fat-free sweets. What foods are not recommended? The items listed may not be a complete list. Talk with your dietitian about what dietary choices are best for you. Grains Baked goods made with fat, such as croissants, muffins, or some breads. Dry pasta or rice meal packs. Vegetables Creamed or fried vegetables. Vegetables in a cheese sauce. Regular canned vegetables (not low-sodium or reduced-sodium). Regular canned tomato sauce and paste (not low-sodium or reduced-sodium). Regular tomato and vegetable juice (not low-sodium or reduced-sodium). Angie Fava. Olives. Fruits Canned fruit in a light or heavy syrup. Fried fruit. Fruit in cream or butter sauce. Meat and other protein foods Fatty cuts of meat. Ribs. Fried meat. Berniece Salines. Sausage. Bologna and other processed lunch meats. Salami. Fatback. Hotdogs. Bratwurst. Salted nuts and seeds. Canned beans with added salt. Canned or smoked fish. Whole eggs or egg yolks. Chicken or [place]  with skin. Dairy Whole or 2% milk, cream, and half-and-half. Whole or full-fat cream cheese. Whole-fat or sweetened yogurt. Full-fat cheese. Nondairy creamers. Whipped toppings. Processed cheese and cheese spreads. Fats and oils Butter. Stick margarine. Lard. Shortening. Ghee. Bacon fat. Tropical oils, such as coconut, palm kernel, or palm oil. Seasoning and other foods Salted popcorn and pretzels. Onion salt, garlic salt, seasoned salt, table salt, and sea salt. Worcestershire sauce. Tartar sauce. Barbecue sauce. Teriyaki sauce. Soy sauce, including reduced-sodium. Steak sauce. Canned and packaged  gravies. Fish sauce. Oyster sauce. Cocktail sauce. Horseradish that you find on the shelf. Ketchup. Mustard. Meat flavorings and tenderizers. Bouillon cubes. Hot sauce and Tabasco sauce. Premade or packaged marinades. Premade or packaged taco seasonings. Relishes. Regular salad dressings. Where to find more information:  National Heart, Lung, and [place]: https://wilson-eaton.com/  American Heart Association: www.heart.org Summary  The DASH eating plan is a healthy eating plan that has been shown to reduce high blood pressure (hypertension). It may also reduce your risk for type 2 diabetes, heart disease, and stroke.  With the DASH eating plan, you should limit salt (sodium) intake to 2,300 mg a day. If you have hypertension, you may need to reduce your sodium intake to 1,500 mg a day.  When on the DASH eating plan, aim to eat more fresh fruits and vegetables, whole grains, lean proteins, low-fat dairy, and heart-healthy fats.  Work with your health care provider or diet and nutrition specialist (dietitian) to adjust your eating plan to your individual calorie needs. This information is not intended to replace advice given to you by your health care provider. Make sure you discuss any questions you have with your health care provider. Document Released: 12/15/2010 Document Revised: 12/20/2015 Document Reviewed: 12/20/2015 Elsevier Interactive Patient Education  2017 [employer].

## 2016-07-11 NOTE — Assessment & Plan Note (Signed)
Last thyroid normal; check yearly unless excessive change in weight or energy level

## 2016-08-07 ENCOUNTER — Other Ambulatory Visit: Payer: Self-pay | Admitting: Family Medicine

## 2016-08-07 NOTE — Telephone Encounter (Signed)
Cr reviewed Rx approved 

## 2016-08-21 ENCOUNTER — Other Ambulatory Visit: Payer: Self-pay | Admitting: Family Medicine

## 2016-09-14 ENCOUNTER — Telehealth: Payer: Self-pay

## 2016-09-14 NOTE — Telephone Encounter (Signed)
erro  neous encounter

## 2016-09-20 ENCOUNTER — Encounter: Payer: Self-pay | Admitting: Family Medicine

## 2016-09-20 ENCOUNTER — Ambulatory Visit (INDEPENDENT_AMBULATORY_CARE_PROVIDER_SITE_OTHER): Payer: BC Managed Care – PPO | Admitting: Family Medicine

## 2016-09-20 ENCOUNTER — Telehealth: Payer: Self-pay

## 2016-09-20 VITALS — BP 162/84 | HR 64 | Temp 98.1°F | Resp 16 | Ht 64.0 in | Wt 253.9 lb

## 2016-09-20 DIAGNOSIS — E039 Hypothyroidism, unspecified: Secondary | ICD-10-CM | POA: Diagnosis not present

## 2016-09-20 DIAGNOSIS — I10 Essential (primary) hypertension: Secondary | ICD-10-CM | POA: Diagnosis not present

## 2016-09-20 MED ORDER — AMLODIPINE BESYLATE-VALSARTAN 5-160 MG PO TABS: 1.0000 | ORAL_TABLET | Freq: Every day | ORAL | 0 refills | Status: DC

## 2016-09-20 NOTE — Telephone Encounter (Signed)
Dr. lada mention to pt at her last visit to call if her BP is still rising. Pt systolic blood pressure readings has been high since last Friday. She spoke with someone at our office; pt was not for sure who but they stated they need more readings therefore a apt was never schedule. I don't see any notes in the chart regarding this phone call. Pt readings are: Friday 168/78, and yesterday it was 173/77 in the morning and 168/78 in the afternoon. Pt states she experienced some days when she wake up in the middle of the night and in the morning headaches and SOB. Pt denied any chest pains, arm pain and blurred vision. I mention to pt Dr. lada is not here but due to the SOB she is having I will feel lot better if she was seen today. Dr.Sowles had at open slot at 2pm but the pt states she have to see if she can get off early. Melissa put the pt on the schedule for 2 pm with Dr. Ancil Boozer. I mention to pt that if she can not get off early for the apt to please call us 1-2 hours before the pt and see if we can get her on tomorrow with Dr. Gretta Began.

## 2016-09-20 NOTE — Progress Notes (Signed)
Name: Elizabeth Russo   MRN: [id number]    DOB: [birth year]   Date:09/20/2016       Progress Note  Subjective  Chief Complaint  Chief Complaint  Patient presents with  . Hypertension    Pt states BP has been elevated around 170's/80's, has been trying to watch her diet and walk more.  Patient states she has been lightheaded for the past couple of weeks.     HPI  Uncontrolled HTN: she has a history of CHF, and hypothyroidism. She was doing well, however over the past 3 months bp has been elevated. BP at home in the 160's-170's range and today's visit also elevated. She has episodes of palpitation and SOB,on previous records there was a note about afib, but not found on cardiologist evaluation. She denies orthopnea, only has mild swelling on ankles at the end of the day. Goal bp is below 130/80. Unable to take HCTZ or hydralazine because of gout. She is on Coreg and Losartan , discussed options and we will try Exforge . We will also check TSH to make sure not suppressed TSH causing her palpitation , elevation of bp . She hs noticed some vertigo when moving head. Advised referral to ENT but she would like to try medication before going to see ENT   Patient Active Problem List   Diagnosis Date Noted  . Shortness of breath 01/05/2016  . Vertigo 10/06/2015  . Medication monitoring encounter 04/26/2015  . History of non-ST elevation myocardial infarction (NSTEMI) 02/05/2015  . Hx of percutaneous transluminal coronary angioplasty 02/05/2015  . Gout 12/25/2014  . Gout of knee 12/25/2014  . Low back pain 12/25/2014  . Night sweats 10/23/2014  . Localized enlarged lymph nodes 10/23/2014  . Knee pain, bilateral 10/23/2014  . Joint pain 10/01/2014  . Morbid obesity ([place]) 06/26/2014  . Chronic combined systolic and diastolic heart failure ([place]) 05/24/2014  . Breast mass, right 04/25/2014  . Goiter 03/19/2014  . Coronary atherosclerosis 12/19/2013  . Hyperlipidemia 12/19/2013  . Essential  hypertension, benign 12/18/2013  . Well controlled type 2 diabetes mellitus ([place]) 12/18/2013  . Hypothyroidism 12/18/2013  . Hearing loss 08/25/2011  . Atrial fibrillation ([place]) 02/13/2011    Past Surgical History:  Procedure Laterality Date  . ABDOMINAL HYSTERECTOMY    . heart stent  2012    Family History  Problem Relation Age of Onset  . Heart disease Mother   . Diabetes Mother   . Hypertension Mother   . Hyperlipidemia Mother   . Thyroid disease Mother   . Cancer Father        colon  . Diabetes Father   . Hypertension Father   . Diabetes Sister   . Hypertension Sister   . Heart disease Sister   . Hyperlipidemia Sister   . Kidney disease Sister   . Cancer Maternal Grandfather        lung  . Heart attack Paternal Grandfather   . Heart disease Sister   . Diabetes Sister   . Heart disease Sister   . Arthritis Sister     Social History   Social History  . Marital status: Married    Spouse name: N/A  . Number of children: N/A  . Years of education: N/A   Occupational History  . Not on file.   Social History Main Topics  . Smoking status: Former Smoker    Packs/day: 0.50    Years: 5.00    Types: Cigarettes    Start date:  01/09/2006    Quit date: 06/26/2011  . Smokeless tobacco: Never Used  . Alcohol use No  . Drug use: No  . Sexual activity: Not Currently   Other Topics Concern  . Not on file   Social History Narrative  . No narrative on file     Current Outpatient Prescriptions:  .  aspirin EC 81 MG tablet, Take 81 mg by mouth daily. , Disp: , Rfl:  .  carvedilol (COREG) 25 MG tablet, TAKE 1 TABLET BY MOUTH TWICE DAILY WITH A MEAL, Disp: 60 tablet, Rfl: 11 .  ezetimibe (ZETIA) 10 MG tablet, Take 1 tablet (10 mg total) by mouth daily., Disp: 30 tablet, Rfl: 11 .  fluticasone (FLONASE) 50 MCG/ACT nasal spray, Place 2 sprays into both nostrils daily. , Disp: , Rfl:  .  Insulin Degludec-Liraglutide (XULTOPHY) 100-3.6 UNIT-MG/ML SOPN, Inject 15 Units  into the skin daily., Disp: 5 pen, Rfl: 6 .  Insulin Pen Needle (BD PEN NEEDLE NANO U/F) 32G X 4 MM MISC, Use for insulin administration four times daily., Disp: 200 each, Rfl: 1 .  levothyroxine (SYNTHROID, LEVOTHROID) 137 MCG tablet, Take 1 tablet (137 mcg total) by mouth daily before breakfast. On Tuesdays, Thursdays, Saturdays, and Sundays only, Disp: 17 tablet, Rfl: 11 .  levothyroxine (SYNTHROID, LEVOTHROID) 150 MCG tablet, TAKE 1 TABLET BY MOUTH 3 DAYS A WEEK (MONDAYS, WEDNESDAYS & FRIDAYS) USE 137 MCG TABLET ON THE OTHER DAYS, Disp: 13 tablet, Rfl: PRN .  metFORMIN (GLUCOPHAGE-XR) 500 MG 24 hr tablet, TAKE 1 TABLET BY MOUTH ONCE DAILY WITH BREAKFAST, Disp: 30 tablet, Rfl: 5 .  ONE TOUCH ULTRA TEST test strip, Check sugar 3 times daily, Disp: 100 each, Rfl: 5 .  amLODipine-valsartan (EXFORGE) 5-160 MG tablet, Take 1 tablet by mouth daily., Disp: 30 tablet, Rfl: 0  Allergies  Allergen Reactions  . Atorvastatin Other (See Comments)  . Hydralazine Other (See Comments)    Aggravated gout  . Hydrochlorothiazide Other (See Comments)    Aggravated gout  . Lisinopril Cough  . Pravastatin Other (See Comments)  . Rosuvastatin Other (See Comments)     ROS  Constitutional: Negative for fever or weight change.  Respiratory: Negative for cough but has intermittent  shortness of breath.   Cardiovascular: Negative for chest pain but has intermittent palpitations.  Gastrointestinal: Negative for abdominal pain, no bowel changes.  Musculoskeletal: Negative for gait problem or joint swelling.  Skin: Negative for rash.  Neurological: Negative for dizziness or headache.  No other specific complaints in a complete review of systems (except as listed in HPI above).  Objective  Vitals:   09/20/16 1405  BP: (!) 162/84  Pulse: 64  Resp: 16  Temp: 98.1 F (36.7 C)  TempSrc: Oral  SpO2: 98%  Weight: 253 lb 14.4 oz (115.2 kg)  Height: 5\' 4"  (1.626 m)    Body mass index is 43.58  kg/m.  Physical Exam  Constitutional: Patient appears well-developed and well-nourished. Obese  No distress.  HEENT: head atraumatic, normocephalic, pupils equal and reactive to light,  neck supple, throat within normal limits Cardiovascular: Normal rate, regular rhythm and normal heart sounds.  No murmur heard. 1 plus  BLE edema. Pulmonary/Chest: Effort normal and breath sounds normal. No respiratory distress. Abdominal: Soft.  There is no tenderness. Psychiatric: Patient has a normal mood and affect. behavior is normal. Judgment and thought content normal.  PHQ2/9: Depression screen [facility] 2/9 09/20/2016 07/11/2016 05/11/2016 04/04/2016 01/05/2016  Decreased Interest 0 0 0 0 0  Down, Depressed, Hopeless 0 0 0 0 0  PHQ - 2 Score 0 0 0 0 0     Fall Risk: Fall Risk  09/20/2016 07/11/2016 05/11/2016 04/04/2016 01/05/2016  Falls in the past year? Yes Yes Yes Yes No  Number falls in past yr: 2 or more 2 or more 1 2 or more -  Injury with Fall? No No No No -     Functional Status Survey: Is the patient deaf or have difficulty hearing?: No Does the patient have difficulty seeing, even when wearing glasses/contacts?: No Does the patient have difficulty concentrating, remembering, or making decisions?: No Does the patient have difficulty walking or climbing stairs?: No Does the patient have difficulty dressing or bathing?: No Does the patient have difficulty doing errands alone such as visiting a doctor's office or shopping?: No    Assessment & Plan  1. Uncontrolled hypertension  Currently on Coreg and Losartan 100 mg, however bp is staying above goal at home and during today's visit. She can't take HCTZ because of gout, we will try Exforge, explained risk of Norvasc but also the importance of getting bp under control.  - TSH - amLODipine-valsartan (EXFORGE) 5-160 MG tablet; Take 1 tablet by mouth daily.  Dispense: 30 tablet; Refill: 0  2. Hypothyroidism, unspecified type  - TSH

## 2016-09-21 LAB — TSH: TSH: 1 mIU/L

## 2016-10-11 ENCOUNTER — Encounter: Payer: Self-pay | Admitting: Family Medicine

## 2016-10-11 ENCOUNTER — Ambulatory Visit (INDEPENDENT_AMBULATORY_CARE_PROVIDER_SITE_OTHER): Payer: BC Managed Care – PPO | Admitting: Family Medicine

## 2016-10-11 VITALS — BP 136/82 | HR 64 | Temp 98.2°F | Resp 14 | Wt 249.1 lb

## 2016-10-11 DIAGNOSIS — Z1231 Encounter for screening mammogram for malignant neoplasm of breast: Secondary | ICD-10-CM | POA: Insufficient documentation

## 2016-10-11 DIAGNOSIS — I10 Essential (primary) hypertension: Secondary | ICD-10-CM

## 2016-10-11 DIAGNOSIS — E782 Mixed hyperlipidemia: Secondary | ICD-10-CM

## 2016-10-11 DIAGNOSIS — H539 Unspecified visual disturbance: Secondary | ICD-10-CM | POA: Insufficient documentation

## 2016-10-11 DIAGNOSIS — N6310 Unspecified lump in the right breast, unspecified quadrant: Secondary | ICD-10-CM

## 2016-10-11 DIAGNOSIS — I252 Old myocardial infarction: Secondary | ICD-10-CM | POA: Diagnosis not present

## 2016-10-11 DIAGNOSIS — E119 Type 2 diabetes mellitus without complications: Secondary | ICD-10-CM | POA: Diagnosis not present

## 2016-10-11 DIAGNOSIS — Z5181 Encounter for therapeutic drug level monitoring: Secondary | ICD-10-CM

## 2016-10-11 DIAGNOSIS — Z23 Encounter for immunization: Secondary | ICD-10-CM

## 2016-10-11 DIAGNOSIS — I2510 Atherosclerotic heart disease of native coronary artery without angina pectoris: Secondary | ICD-10-CM | POA: Diagnosis not present

## 2016-10-11 DIAGNOSIS — E039 Hypothyroidism, unspecified: Secondary | ICD-10-CM | POA: Diagnosis not present

## 2016-10-11 MED ORDER — AMLODIPINE BESYLATE-VALSARTAN 10-160 MG PO TABS: 1.0000 | ORAL_TABLET | Freq: Every day | ORAL | 5 refills | Status: DC

## 2016-10-11 NOTE — Assessment & Plan Note (Signed)
Last thyroid check normal, continue medicine

## 2016-10-11 NOTE — Assessment & Plan Note (Signed)
Encouragement given; continue xultophy and metformin; discussed water intake, movement, limiting portions

## 2016-10-11 NOTE — Assessment & Plan Note (Signed)
Encouraged visit with Dr. Nadean Corwin

## 2016-10-11 NOTE — Assessment & Plan Note (Signed)
Check A1c today; foot exam by MD 

## 2016-10-11 NOTE — Assessment & Plan Note (Signed)
Check lipids today; goal LDL less than 70; avoid saturated fats, work on [employer]

## 2016-10-11 NOTE — Assessment & Plan Note (Signed)
Increase the amlodpine component; continue ARB; patient to monitor and let me know if not under 818 systolic; keep working onw eight loss

## 2016-10-11 NOTE — Assessment & Plan Note (Signed)
May be fluctuating sugars, but needs exam to r/o glaucoma, etc

## 2016-10-11 NOTE — Assessment & Plan Note (Signed)
Check kidneys today

## 2016-10-11 NOTE — Progress Notes (Signed)
BP 136/82   Pulse 64   Temp 98.2 F (36.8 C) (Oral)   Resp 14   Wt 249 lb 1.6 oz (113 kg)   LMP  (LMP Unknown)   SpO2 97%   BMI 42.76 kg/m    Subjective:    Patient ID: Elizabeth Russo, female    DOB: 1960-06-15, 56 y.o.   MRN: [id number]  HPI: Elizabeth Russo is a 56 y.o. female  Chief Complaint  Patient presents with  . Follow-up   HPI She was here last month, saw colleague; high blood pressure; started amlodipine+valsartan Today's reading much better, reviewed last note BP checks away from doctor: 160 something over 70 something, she doesn't think it has changed that much, she has them written down but forgot card Also taking coreg Taking zetia for cholesterol; trying to avoid high fat meats; little cheese xultophy for diabetes; lost >4 pounds since last visit less than one month ago; FSBS at home 99 this morning Hypothyroid; checked last visit with BP  Lab Results  Component Value Date   TSH 1.00 09/20/2016  came fasting for labs Hx of MI, CAD; has cardiologist but hasn't seen in a while, Dr. Nadean Russo  Depression screen [facility] 2/9 10/11/2016 09/20/2016 07/11/2016 05/11/2016 04/04/2016  Decreased Interest 0 0 0 0 0  Down, Depressed, Hopeless 0 0 0 0 0  PHQ - 2 Score 0 0 0 0 0    Relevant past medical, surgical, family and social history reviewed Past Medical History:  Diagnosis Date  . CAD (coronary artery disease)   . Diabetes mellitus without complication ([place])   . Diverticulosis   . Hypertension   . Hypothyroid   . Morbid obesity ([place]) 06/26/2014  . Vertigo    Past Surgical History:  Procedure Laterality Date  . ABDOMINAL HYSTERECTOMY    . heart stent  2012   Family History  Problem Relation Age of Onset  . Heart disease Mother   . Diabetes Mother   . Hypertension Mother   . Hyperlipidemia Mother   . Thyroid disease Mother   . Cancer Father        colon  . Diabetes Father   . Hypertension Father   . Diabetes Sister   . Hypertension Sister   . Heart  disease Sister   . Hyperlipidemia Sister   . Kidney disease Sister   . Cancer Maternal Grandfather        lung  . Heart attack Paternal Grandfather   . Heart disease Sister   . Diabetes Sister   . Heart disease Sister   . Arthritis Sister    Social History   Social History  . Marital status: Married    Spouse name: N/A  . Number of children: N/A  . Years of education: N/A   Occupational History  . Not on file.   Social History Main Topics  . Smoking status: Former Smoker    Packs/day: 0.50    Years: 5.00    Types: Cigarettes    Start date: 01/09/2006    Quit date: 06/26/2011  . Smokeless tobacco: Never Used  . Alcohol use No  . Drug use: No  . Sexual activity: Not Currently   Other Topics Concern  . Not on file   Social History Narrative  . No narrative on file    Interim medical history since last visit reviewed. Allergies and medications reviewed  Review of Systems Per HPI unless specifically indicated above     Objective:  BP 136/82   Pulse 64   Temp 98.2 F (36.8 C) (Oral)   Resp 14   Wt 249 lb 1.6 oz (113 kg)   LMP  (LMP Unknown)   SpO2 97%   BMI 42.76 kg/m   Wt Readings from Last 3 Encounters:  10/11/16 249 lb 1.6 oz (113 kg)  09/20/16 253 lb 14.4 oz (115.2 kg)  07/11/16 251 lb (113.9 kg)    Physical Exam  Constitutional: She appears well-developed and well-nourished. No distress.  Morbidly obese, but weight down another 4+ pounds since last visit  HENT:  Head: Normocephalic and atraumatic.  Right Ear: Hearing and external ear normal.  Left Ear: Hearing and external ear normal.  Nose: No rhinorrhea.  Mouth/Throat: Oropharynx is clear and moist.  Eyes: EOM are normal. No scleral icterus.  Neck: No thyromegaly present.  Cardiovascular: Normal rate, regular rhythm and normal heart sounds.   Pulmonary/Chest: Effort normal and breath sounds normal.  Abdominal: Soft. Bowel sounds are normal. She exhibits no distension.  Musculoskeletal:  Normal range of motion. She exhibits no edema.  Neurological: She is alert. She exhibits normal muscle tone.  Skin: Skin is warm and dry. She is not diaphoretic. No pallor.  Psychiatric: She has a normal mood and affect. Her behavior is normal. Judgment and thought content normal.   Diabetic Foot Form - Detailed   Diabetic Foot Exam - detailed Diabetic Foot exam was performed with the following findings:  Yes 10/11/2016  9:32 AM  Visual Foot Exam completed.:  Yes  Pulse Foot Exam completed.:  Yes  Right Dorsalis Pedis:  Present Left Dorsalis Pedis:  Present  Sensory Foot Exam Completed.:  Yes Semmes-Weinstein Monofilament Test R Site 1-Great Toe:  Pos L Site 1-Great Toe:  Pos        Results for orders placed or performed in visit on 09/20/16  TSH  Result Value Ref Range   TSH 1.00 mIU/L      Assessment & Plan:   Problem List Items Addressed This Visit      Cardiovascular and Mediastinum   Essential hypertension, benign (Chronic)    Increase the amlodpine component; continue ARB; patient to monitor and let me know if not under 509 systolic; keep working onw eight loss      Relevant Medications   amLODipine-valsartan (EXFORGE) 10-160 MG tablet   Coronary atherosclerosis (Chronic)    Encouraged visit with Dr. Nadean Russo      Relevant Medications   amLODipine-valsartan (EXFORGE) 10-160 MG tablet     Endocrine   Well controlled type 2 diabetes mellitus ([place]) - Primary (Chronic)    Check A1c today; foot exam by MD      Relevant Medications   amLODipine-valsartan (EXFORGE) 10-160 MG tablet   Other Relevant Orders   Ambulatory referral to Ophthalmology   Hypothyroidism (Chronic)    Last thyroid check normal, continue medicine        Other   Vision changes    May be fluctuating sugars, but needs exam to r/o glaucoma, etc      Relevant Orders   Ambulatory referral to Ophthalmology   Screening mammogram, encounter for    Order mammo through [facility]      Relevant Orders     MM Digital Screening   Morbid obesity ([place]) (Chronic)    Encouragement given; continue xultophy and metformin; discussed water intake, movement, limiting portions      Medication monitoring encounter    Check kidneys today  Hyperlipidemia    Check lipids today; goal LDL less than 70; avoid saturated fats, work on weight      Relevant Medications   amLODipine-valsartan (EXFORGE) 10-160 MG tablet   History of non-ST elevation myocardial infarction (NSTEMI) (Chronic)    Five years ago; asymptomatic, keep fu wth cardiologist; continue beta-blocker, ARB, statin, aspirin      Breast mass, right    Hx of cyst; asymptomatic, gone now; mammogram UTD; next due later this month       Other Visit Diagnoses    Need for shingles vaccine       Relevant Orders   Varicella-zoster vaccine IM (Shingrix) (Completed)       Follow up plan: Return in about 3 months (around 01/11/2017) for follow-up visit with Dr. Sanda Klein.  An after-visit summary was printed and given to the patient at [place].  Please see the patient instructions which may contain other information and recommendations beyond what is mentioned above in the assessment and plan.  Meds ordered this encounter  Medications  . amLODipine-valsartan (EXFORGE) 10-160 MG tablet    Sig: Take 1 tablet by mouth daily.    Dispense:  30 tablet    Refill:  5    Increasing dose    Orders Placed This Encounter  Procedures  . MM Digital Screening  . Varicella-zoster vaccine IM (Shingrix)  . Ambulatory referral to Ophthalmology

## 2016-10-11 NOTE — Patient Instructions (Signed)
Change the dose of the amlodipine-valsartan We'll see what your labs show and discuss tomorrow Check out the information at familydoctor.org entitled "Nutrition for Weight Loss: What You Need to Know about Fad Diets" Try to lose between 1-2 pounds per week by taking in fewer calories and burning off more calories You can succeed by limiting portions, limiting foods dense in calories and fat, becoming more active, and drinking 8 glasses of water a day (64 ounces) Don't skip meals, especially breakfast, as skipping meals may alter your metabolism Do not use over-the-counter weight loss pills or gimmicks that claim rapid weight loss A healthy BMI (or body mass index) is between 18.5 and 24.9 You can calculate your ideal BMI at the [employer] website ClubMonetize.fr Let's shoot for 10 pounds by next visit in 3 months

## 2016-10-11 NOTE — Assessment & Plan Note (Signed)
Five years ago; asymptomatic, keep fu wth cardiologist; continue beta-blocker, ARB, statin, aspirin

## 2016-10-11 NOTE — Assessment & Plan Note (Signed)
Hx of cyst; asymptomatic, gone now; mammogram UTD; next due later this month

## 2016-10-11 NOTE — Assessment & Plan Note (Signed)
Order mammo through [facility]

## 2016-10-12 LAB — COMPLETE METABOLIC PANEL WITH GFR
AG Ratio: 1.2 (calc) (ref 1.0–2.5)
ALBUMIN MSPROF: 4 g/dL (ref 3.6–5.1)
ALKALINE PHOSPHATASE (APISO): 80 U/L (ref 33–130)
ALT: 15 U/L (ref 6–29)
AST: 16 U/L (ref 10–35)
BUN: 7 mg/dL (ref 7–25)
CALCIUM: 8.7 mg/dL (ref 8.6–10.4)
CO2: 28 mmol/L (ref 20–32)
CREATININE: 0.76 mg/dL (ref 0.50–1.05)
Chloride: 107 mmol/L (ref 98–110)
GFR, EST AFRICAN AMERICAN: 102 mL/min/{1.73_m2} (ref >=60)
GFR, Est Non African American: 88 mL/min/{1.73_m2} (ref >=60)
GLUCOSE: 103 mg/dL — AB (ref 65–99)
Globulin: 3.3 g/dL (calc) (ref 1.9–3.7)
Potassium: 4 mmol/L (ref 3.5–5.3)
Sodium: 141 mmol/L (ref 135–146)
TOTAL PROTEIN: 7.3 g/dL (ref 6.1–8.1)
Total Bilirubin: 0.4 mg/dL (ref 0.2–1.2)

## 2016-10-12 LAB — HEMOGLOBIN A1C
EAG (MMOL/L): 6.6 (calc)
Hgb A1c MFr Bld: 5.8 % of total Hgb — ABNORMAL HIGH (ref <5.7)
Mean Plasma Glucose: 120 (calc)

## 2016-10-12 LAB — LIPID PANEL
CHOL/HDL RATIO: 3.6 (calc) (ref <5.0)
CHOLESTEROL: 155 mg/dL (ref <200)
HDL: 43 mg/dL — ABNORMAL LOW (ref >50)
LDL CHOLESTEROL (CALC): 98 mg/dL
NON-HDL CHOLESTEROL (CALC): 112 mg/dL (ref <130)
Triglycerides: 57 mg/dL (ref <150)

## 2016-10-23 ENCOUNTER — Ambulatory Visit (INDEPENDENT_AMBULATORY_CARE_PROVIDER_SITE_OTHER): Payer: BC Managed Care – PPO | Admitting: Family Medicine

## 2016-10-23 ENCOUNTER — Telehealth: Payer: Self-pay

## 2016-10-23 ENCOUNTER — Encounter: Payer: Self-pay | Admitting: Family Medicine

## 2016-10-23 VITALS — BP 138/64 | HR 72 | Ht 64.0 in | Wt 246.9 lb

## 2016-10-23 DIAGNOSIS — R238 Other skin changes: Secondary | ICD-10-CM

## 2016-10-23 DIAGNOSIS — R300 Dysuria: Secondary | ICD-10-CM | POA: Diagnosis not present

## 2016-10-23 DIAGNOSIS — Z113 Encounter for screening for infections with a predominantly sexual mode of transmission: Secondary | ICD-10-CM | POA: Diagnosis not present

## 2016-10-23 DIAGNOSIS — N898 Other specified noninflammatory disorders of vagina: Secondary | ICD-10-CM

## 2016-10-23 LAB — POCT URINALYSIS DIPSTICK
BILIRUBIN UA: NEGATIVE
GLUCOSE UA: NEGATIVE
Ketones, UA: NEGATIVE
Nitrite, UA: NEGATIVE
SPEC GRAV UA: 1.02 (ref 1.010–1.025)
Urobilinogen, UA: 0.2 E.U./dL
pH, UA: 6 (ref 5.0–8.0)

## 2016-10-23 MED ORDER — HYDROCODONE-ACETAMINOPHEN 5-325 MG PO TABS: 0.5000 | ORAL_TABLET | Freq: Four times a day (QID) | ORAL | 0 refills | Status: DC | PRN

## 2016-10-23 MED ORDER — VALACYCLOVIR HCL 1 G PO TABS: 1000.0000 mg | ORAL_TABLET | Freq: Two times a day (BID) | ORAL | 0 refills | Status: DC

## 2016-10-23 NOTE — Telephone Encounter (Signed)
Called pt, no answer. LM for pt informing her of the need to come back to the office to complete blood work.

## 2016-10-23 NOTE — Progress Notes (Addendum)
BP 138/64 (BP Location: Left Arm, Patient Position: Sitting, Cuff Size: Large)   Pulse 72   Ht 5\' 4"  (1.626 m)   Wt 246 lb 14.4 oz (112 kg)   LMP  (LMP Unknown)   SpO2 96%   BMI 42.38 kg/m    Subjective:    Patient ID: Elizabeth Russo, female    DOB: 09-Jul-1960, 56 y.o.   MRN: [id number]  HPI: Elizabeth Russo is a 55 y.o. female  Chief Complaint  Patient presents with  . Vaginal Discharge    HPI Nine or ten day of symptoms; burning with urination, vaginal discharge No blood in urine or vaginally Moving bowels okay No fever Sexually active, no condoms, first time in 3 years Little bit of back pain Has had bladder infections, but this feels different Mainly on the outside, burns really bad Saw a few blisters No hx of herpes Agrees with full screen including HIV Tried the monostat which helped some, then got worse  Depression screen [facility] 2/9 10/11/2016 09/20/2016 07/11/2016 05/11/2016 04/04/2016  Decreased Interest 0 0 0 0 0  Down, Depressed, Hopeless 0 0 0 0 0  PHQ - 2 Score 0 0 0 0 0    Relevant past medical, surgical, family and social history reviewed Past Medical History:  Diagnosis Date  . CAD (coronary artery disease)   . Diabetes mellitus without complication ([place])   . Diverticulosis   . Hypertension   . Hypothyroid   . Morbid obesity ([place]) 06/26/2014  . Vertigo    Past Surgical History:  Procedure Laterality Date  . ABDOMINAL HYSTERECTOMY    . heart stent  2012   Family History  Problem Relation Age of Onset  . Heart disease Mother   . Diabetes Mother   . Hypertension Mother   . Hyperlipidemia Mother   . Thyroid disease Mother   . Cancer Father        colon  . Diabetes Father   . Hypertension Father   . Diabetes Sister   . Hypertension Sister   . Heart disease Sister   . Hyperlipidemia Sister   . Kidney disease Sister   . Cancer Maternal Grandfather        lung  . Heart attack Paternal Grandfather   . Heart disease Sister   . Diabetes  Sister   . Heart disease Sister   . Arthritis Sister    Social History   Social History  . Marital status: Married    Spouse name: N/A  . Number of children: N/A  . Years of education: N/A   Occupational History  . Not on file.   Social History Main Topics  . Smoking status: Former Smoker    Packs/day: 0.50    Years: 5.00    Types: Cigarettes    Start date: 01/09/2006    Quit date: 06/26/2011  . Smokeless tobacco: Never Used  . Alcohol use 0.0 oz/week     Comment: Socially   . Drug use: No  . Sexual activity: Yes    Birth control/ protection: Post-menopausal   Other Topics Concern  . Not on file   Social History Narrative  . No narrative on file    Interim medical history since last visit reviewed. Allergies and medications reviewed  Review of Systems Per HPI unless specifically indicated above     Objective:    BP 138/64 (BP Location: Left Arm, Patient Position: Sitting, Cuff Size: Large)   Pulse 72   Ht  5\' 4"  (1.626 m)   Wt 246 lb 14.4 oz (112 kg)   LMP  (LMP Unknown)   SpO2 96%   BMI 42.38 kg/m   Wt Readings from Last 3 Encounters:  10/23/16 246 lb 14.4 oz (112 kg)  10/11/16 249 lb 1.6 oz (113 kg)  09/20/16 253 lb 14.4 oz (115.2 kg)    Physical Exam  Constitutional: She appears well-developed and well-nourished.  HENT:  Mouth/Throat: Mucous membranes are normal.  Eyes: EOM are normal. No scleral icterus.  Cardiovascular: Normal rate and regular rhythm.   Pulmonary/Chest: Effort normal and breath sounds normal.  Genitourinary: There is rash and tenderness on the right labia. There is rash and tenderness on the left labia. There is tenderness in the vagina. No erythema or bleeding in the vagina. Vaginal discharge (scant) found.  Genitourinary Comments: Numerous shallow ulcerated lesions on both labia  Skin:  Ulcerated lesions on both labia, very tender  Psychiatric:  Briefly tearful when discussing ddx    Results for orders placed or performed in  visit on 10/23/16  POCT Urinalysis Dipstick  Result Value Ref Range   Color, UA pale yellow    Clarity, UA clear    Glucose, UA neg    Bilirubin, UA neg    Ketones, UA neg    Spec Grav, UA 1.020 1.010 - 1.025   Blood, UA LARGE    pH, UA 6.0 5.0 - 8.0   Protein, UA TRACE    Urobilinogen, UA 0.2 0.2 or 1.0 E.U./dL   Nitrite, UA neg    Leukocytes, UA Moderate (2+) (A) Negative      Assessment & Plan:   Problem List Items Addressed This Visit    None    Visit Diagnoses    Rash, vesicular    -  Primary   suspicious for HSV; viral culture swab sent, along with blood tests; start valacyclovir; mode of transmission discussed, condom use urged   Relevant Orders   HSV 1/2 Ab (IgM), IFA w/rflx Titer   Vaginal discharge       wet mount collected, results pending   Relevant Orders   WET PREP BY MOLECULAR PROBE   POCT Urinalysis Dipstick (Completed)   C. trachomatis/N. gonorrhoeae RNA   HSV 1/2 Ab (IgM), IFA w/rflx Titer   Burning with urination       urine today with leukocytes, culture pending   Relevant Orders   POCT Urinalysis Dipstick (Completed)   Urine Culture   HSV 1/2 Ab (IgM), IFA w/rflx Titer   Screen for STD (sexually transmitted disease)       screen for STDs, including HIV; encouraged condom use every time   Relevant Orders   HIV antibody   RPR   Hepatitis panel, acute   Viral culture   C. trachomatis/N. gonorrhoeae RNA       Follow up plan: No Follow-up on file.  An after-visit summary was printed and given to the patient at [place].  Please see the patient instructions which may contain other information and recommendations beyond what is mentioned above in the assessment and plan.  Meds ordered this encounter  Medications  . allopurinol (ZYLOPRIM) 100 MG tablet    Sig: Take by mouth.  . diclofenac sodium (VOLTAREN) 1 % GEL    Sig: Apply topically.  Marland Kitchen DISCONTD: HYDROcodone-acetaminophen (NORCO/VICODIN) 5-325 MG tablet    Sig: Take by mouth.  .  insulin aspart protamine - aspart (NOVOLOG MIX 70/30 FLEXPEN) (70-30) 100 UNIT/ML FlexPen  Sig: Inject into the skin.  . Insulin Glargine (TOUJEO SOLOSTAR) 300 UNIT/ML SOPN    Sig: Inject into the skin.  Marland Kitchen DISCONTD: losartan (COZAAR) 50 MG tablet    Sig: Take by mouth.  . DISCONTD: aspirin EC 81 MG tablet    Sig: Take by mouth.  . DISCONTD: carvedilol (COREG) 12.5 MG tablet    Sig: TAKE 1 TABLET (12.5MG ) BY MOUTH TWICE DAILY WITH MEALS  . DISCONTD: ezetimibe (ZETIA) 10 MG tablet    Sig: Take by mouth.  . DISCONTD: fluticasone (FLONASE) 50 MCG/ACT nasal spray    Sig: Place into the nose.  Marland Kitchen DISCONTD: levothyroxine (SYNTHROID, LEVOTHROID) 150 MCG tablet    Sig: Take by mouth.  . DISCONTD: Levothyroxine Sodium (TIROSINT) 137 MCG CAPS    Sig: Take by mouth.  . valACYclovir (VALTREX) 1000 MG tablet    Sig: Take 1 tablet (1,000 mg total) by mouth 2 (two) times daily.    Dispense:  20 tablet    Refill:  0  . HYDROcodone-acetaminophen (NORCO/VICODIN) 5-325 MG tablet    Sig: Take 0.5-1 tablets by mouth every 6 (six) hours as needed for moderate pain.    Dispense:  12 tablet    Refill:  0    Orders Placed This Encounter  Procedures  . WET PREP BY MOLECULAR PROBE  . Viral culture  . Urine Culture  . C. trachomatis/N. gonorrhoeae RNA  . HIV antibody  . RPR  . Hepatitis panel, acute  . HSV 1/2 Ab (IgM), IFA w/rflx Titer  . POCT Urinalysis Dipstick   NCCSRS web site reviewed over last 12 months, no red flags

## 2016-10-23 NOTE — Patient Instructions (Addendum)
Sexually Transmitted Disease  A sexually transmitted disease (STD) is a disease or infection that may be passed (transmitted) from person to person, usually during sexual activity. This may happen by way of saliva, semen, blood, vaginal mucus, or urine. Common STDs include:   Gonorrhea.   Chlamydia.   Syphilis.   HIV and AIDS.   Genital herpes.   Hepatitis B and C.   Trichomonas.   Human papillomavirus (HPV).   Pubic lice.   Scabies.   Mites.   Bacterial vaginosis.    What are the causes?  An STD may be caused by bacteria, a virus, or parasites. STDs are often transmitted during sexual activity if one person is infected. However, they may also be transmitted through nonsexual means. STDs may be transmitted after:   Sexual intercourse with an infected person.   Sharing sex toys with an infected person.   Sharing needles with an infected person or using unclean piercing or tattoo needles.   Having intimate contact with the genitals, mouth, or rectal areas of an infected person.   Exposure to infected fluids during birth.    What are the signs or symptoms?  Different STDs have different symptoms. Some people may not have any symptoms. If symptoms are present, they may include:   Painful or bloody urination.   Pain in the pelvis, abdomen, vagina, anus, throat, or eyes.   A skin rash, itching, or irritation.   Growths, ulcerations, blisters, or sores in the genital and anal areas.   Abnormal vaginal discharge with or without bad odor.   Penile discharge in men.   Fever.   Pain or bleeding during sexual intercourse.   Swollen glands in the groin area.   Yellow skin and eyes (jaundice). This is seen with hepatitis.   Swollen testicles.   Infertility.   Sores and blisters in the mouth.    How is this diagnosed?  To make a diagnosis, your health care provider may:   Take a medical history.   Perform a physical exam.   Take a sample of any discharge to examine.   Swab the throat, cervix,  opening to the penis, rectum, or vagina for testing.   Test a sample of your first morning urine.   Perform blood tests.   Perform a Pap test, if this applies.   Perform a colposcopy.   Perform a laparoscopy.    How is this treated?  Treatment depends on the STD. Some STDs may be treated but not cured.   Chlamydia, gonorrhea, trichomonas, and syphilis can be cured with antibiotic medicine.   Genital herpes, hepatitis, and HIV can be treated, but not cured, with prescribed medicines. The medicines lessen symptoms.   Genital warts from HPV can be treated with medicine or by freezing, burning (electrocautery), or surgery. Warts may come back.   HPV cannot be cured with medicine or surgery. However, abnormal areas may be removed from the cervix, vagina, or vulva.   If your diagnosis is confirmed, your recent sexual partners need treatment. This is true even if they are symptom-free or have a negative culture or evaluation. They should not have sex until their health care providers say it is okay.   Your health care provider may test you for infection again 3 months after treatment.    How is this prevented?  Take these steps to reduce your risk of getting an STD:   Use latex condoms, dental dams, and water-soluble lubricants during sexual activity. Do not use   petroleum jelly or oils.   Avoid having multiple sex partners.   Do not have sex with someone who has other sex partners.   Do not have sex with anyone you do not know or who is at high risk for an STD.   Avoid risky sex practices that can break your skin.   Do not have sex if you have open sores on your mouth or skin.   Avoid drinking too much alcohol or taking illegal drugs. Alcohol and drugs can affect your judgment and put you in a vulnerable position.   Avoid engaging in oral and anal sex acts.   Get vaccinated for HPV and hepatitis. If you have not received these vaccines in the past, talk to your health care provider about whether one or  both might be right for you.   If you are at risk of being infected with HIV, it is recommended that you take a prescription medicine daily to prevent HIV infection. This is called pre-exposure prophylaxis (PrEP). You are considered at risk if:  ? You are a man who has sex with other men (MSM).  ? You are a heterosexual man or woman and are sexually active with more than one partner.  ? You take drugs by injection.  ? You are sexually active with a partner who has HIV.   Talk with your health care provider about whether you are at high risk of being infected with HIV. If you choose to begin PrEP, you should first be tested for HIV. You should then be tested every 3 months for as long as you are taking PrEP.    Contact a health care provider if:   See your health care provider.   Tell your sexual partner(s). They should be tested and treated for any STDs.   Do not have sex until your health care provider says it is okay.  Get help right away if:  Contact your health care provider right away if:   You have severe abdominal pain.   You are a man and notice swelling or pain in your testicles.   You are a woman and notice swelling or pain in your vagina.    This information is not intended to replace advice given to you by your health care provider. Make sure you discuss any questions you have with your health care provider.  Document Released: 03/18/2002 Document Revised: 07/16/2015 Document Reviewed: 07/16/2012  Elsevier Interactive Patient Education  2018 [employer] Inc[employer]

## 2016-10-23 NOTE — Addendum Note (Signed)
Addended by: Nasim Garofano, Satira Anis on: 10/23/2016 10:59 AM   Modules accepted: Orders

## 2016-10-24 LAB — HEPATITIS PANEL, ACUTE
HEP A IGM: NONREACTIVE
Hep B C IgM: NONREACTIVE
Hepatitis B Surface Ag: NONREACTIVE
Hepatitis C Ab: NONREACTIVE
SIGNAL TO CUT-OFF: 0.09 (ref <1.00)

## 2016-10-24 LAB — WET PREP BY MOLECULAR PROBE

## 2016-10-24 LAB — C. TRACHOMATIS/N. GONORRHOEAE RNA
C. trachomatis RNA, TMA: NOT DETECTED
N. gonorrhoeae RNA, TMA: NOT DETECTED

## 2016-10-24 LAB — HIV ANTIBODY (ROUTINE TESTING W REFLEX): HIV 1&2 Ab, 4th Generation: NONREACTIVE

## 2016-10-24 LAB — RPR: RPR: NONREACTIVE

## 2016-10-25 ENCOUNTER — Telehealth: Payer: Self-pay

## 2016-10-25 LAB — URINE CULTURE
MICRO NUMBER: 81147617
SPECIMEN QUALITY:: ADEQUATE

## 2016-10-25 LAB — HSV 1/2 AB (IGM), IFA W/RFLX TITER
HSV 1 IgM Screen: NEGATIVE
HSV 2 IgM Screen: NEGATIVE

## 2016-10-25 NOTE — Telephone Encounter (Signed)
-----   Message from Arnetha Courser, MD sent at 10/24/2016  9:26 AM EDT ----- Please check on the wet mount; let pt know that her tests are negative for gonorrhea, chlamydia, HIV, hepatitis, thank you

## 2016-10-25 NOTE — Telephone Encounter (Signed)
Called pt informed her of negative results. Pt gave verbal understanding.

## 2016-10-26 ENCOUNTER — Other Ambulatory Visit: Payer: Self-pay

## 2016-10-26 DIAGNOSIS — Z113 Encounter for screening for infections with a predominantly sexual mode of transmission: Secondary | ICD-10-CM

## 2016-10-28 LAB — TEST AUTHORIZATION

## 2016-10-28 LAB — HERPES SIMPLEX VIRUS CULTURE
MICRO NUMBER: 81167036
SPECIMEN QUALITY: ADEQUATE

## 2016-10-28 LAB — VIRAL CULTURE VIRC

## 2016-10-30 ENCOUNTER — Telehealth: Payer: Self-pay

## 2016-10-30 NOTE — Telephone Encounter (Signed)
Pt calls back, Cathrine Muster, CMA informed pt of results.

## 2016-10-30 NOTE — Telephone Encounter (Signed)
Called pt no answer. LM for pt to call back.  

## 2016-10-30 NOTE — Telephone Encounter (Signed)
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

## 2016-10-31 ENCOUNTER — Ambulatory Visit: Payer: BC Managed Care – PPO | Admitting: Family Medicine

## 2016-10-31 LAB — HM DIABETES EYE EXAM

## 2017-01-04 ENCOUNTER — Other Ambulatory Visit: Payer: Self-pay | Admitting: Family Medicine
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
## 2017-01-12 ENCOUNTER — Encounter: Payer: Self-pay | Admitting: Family Medicine

## 2017-01-12 ENCOUNTER — Ambulatory Visit: Payer: BC Managed Care – PPO | Admitting: Family Medicine

## 2017-01-12 DIAGNOSIS — I10 Essential (primary) hypertension: Secondary | ICD-10-CM | POA: Diagnosis not present

## 2017-01-12 DIAGNOSIS — E119 Type 2 diabetes mellitus without complications: Secondary | ICD-10-CM

## 2017-01-12 DIAGNOSIS — M1A0690 Idiopathic chronic gout, unspecified knee, without tophus (tophi): Secondary | ICD-10-CM | POA: Diagnosis not present

## 2017-01-12 DIAGNOSIS — E039 Hypothyroidism, unspecified: Secondary | ICD-10-CM

## 2017-01-12 DIAGNOSIS — I252 Old myocardial infarction: Secondary | ICD-10-CM | POA: Diagnosis not present

## 2017-01-12 DIAGNOSIS — Z5181 Encounter for therapeutic drug level monitoring: Secondary | ICD-10-CM | POA: Diagnosis not present

## 2017-01-12 DIAGNOSIS — I5042 Chronic combined systolic (congestive) and diastolic (congestive) heart failure: Secondary | ICD-10-CM | POA: Diagnosis not present

## 2017-01-12 MED ORDER — RANITIDINE HCL 150 MG PO TABS: 150.0000 mg | ORAL_TABLET | Freq: Two times a day (BID) | ORAL | 2 refills | Status: DC

## 2017-01-12 NOTE — Assessment & Plan Note (Signed)
Encouraged weight loss; not interested in bariatric surgery when offered; continue Xultophy

## 2017-01-12 NOTE — Assessment & Plan Note (Signed)
Avoid salt

## 2017-01-12 NOTE — Assessment & Plan Note (Signed)
Continue medicines; asymptomatic

## 2017-01-12 NOTE — Assessment & Plan Note (Signed)
Check A1c; foot exam and eye exam are UTD

## 2017-01-12 NOTE — Patient Instructions (Signed)
Check out the information at familydoctor.org entitled "Nutrition for Weight Loss: What You Need to Know about Fad Diets" Try to lose between 1-2 pounds per week by taking in fewer calories and burning off more calories You can succeed by limiting portions, limiting foods dense in calories and fat, becoming more active, and drinking 8 glasses of water a day (64 ounces) Don't skip meals, especially breakfast, as skipping meals may alter your metabolism Do not use over-the-counter weight loss pills or gimmicks that claim rapid weight loss A healthy BMI (or body mass index) is between 18.5 and 24.9 You can calculate your ideal BMI at the [place] website [employer]fr Try to follow the DASH guidelines (DASH stands for Dietary Approaches to Stop Hypertension). Try to limit the sodium in your diet to no more than 1,500mg  of sodium per day. Certainly try to not exceed 2,000 mg per day at the very most. Do not add salt when cooking or at the table.  Check the sodium amount on labels when shopping, and choose items lower in sodium when given a choice. Avoid or limit foods that already contain a lot of sodium. Eat a diet rich in fruits and vegetables and whole grains, and try to lose weight if overweight or obese Check your blood pressure at work a few times over the coming month, let me know if your top number is not less than 130 mmHg

## 2017-01-12 NOTE — Assessment & Plan Note (Signed)
Recheck TSH and adjust medicine if needed

## 2017-01-12 NOTE — Assessment & Plan Note (Signed)
Monitor liver and kidneys 

## 2017-01-12 NOTE — Assessment & Plan Note (Signed)
Avoid triggers; off of allopurinol; patient opted to check uric acid

## 2017-01-12 NOTE — Progress Notes (Signed)
BP 136/80 (BP Location: Left Arm, Patient Position: Sitting, Cuff Size: Large)   Pulse 62   Temp 97.7 F (36.5 C) (Oral)   Ht 5\' 4"  (1.626 m)   Wt 256 lb 9.6 oz (116.4 kg)   LMP  (LMP Unknown)   SpO2 98%   BMI 44.05 kg/m    Subjective:    Patient ID: Elizabeth Russo, female    DOB: 04-Sep-1960, 57 y.o.   MRN: [id number]  HPI: Elizabeth Russo is a 57 y.o. female  No chief complaint on file. CC: follow-up I guess  HPI Type 2 diabetes; range has been 119 to 145 or so over the last few weeks; having dry mouth; no blurred vision; eye exam UTD, no eye damage from the diabetes  Hypertension; checks BP at work occasionally, but not recently; tries to stay away from salt; knows to avoid black licorice; avoid decongestants  Cholesterol reviewed; dietary indiscretion over Thanksgiving and Christmas; did not tolerate statins at all; on zetia Lab Results  Component Value Date   Russo 155 10/11/2016   Russo 160 04/04/2016   Russo 155 01/05/2016   Lab Results  Component Value Date   HDL 43 (L) 10/11/2016   HDL 37 (L) 04/04/2016   HDL 35 (L) 01/05/2016   Lab Results  Component Value Date   LDLCALC 102 (H) 04/04/2016   LDLCALC 98 01/05/2016   LDLCALC 100 09/15/2015   Lab Results  Component Value Date   TRIG 57 10/11/2016   TRIG 104 04/04/2016   TRIG 112 01/05/2016   Lab Results  Component Value Date   CHOLHDL 3.6 10/11/2016   CHOLHDL 4.3 04/04/2016   CHOLHDL 4.4 01/05/2016   No results found for: LDLDIRECT  Obesity; offered discussion about bariatric surgery, declined  No gout flares; not on allopurinol; knows what to avoid  Hypothyroidism; gaining weight, 10 pounds; no constipation; some indigestion, some constipation; some regurgitation, felt like acid came up Lab Results  Component Value Date   TSH 1.00 09/20/2016   Has not seen her heart doctor in a while; big co-pay; drives to [facility]; no chest pain; no orthopnea; no leg edema; taking aspirin; no nosebleeds or  gum bleeding  Depression screen [facility] 2/9 01/12/2017 10/11/2016 09/20/2016 07/11/2016 05/11/2016  Decreased Interest 0 0 0 0 0  Down, Depressed, Hopeless 0 0 0 0 0  PHQ - 2 Score 0 0 0 0 0    Relevant past medical, surgical, family and social history reviewed Past Medical History:  Diagnosis Date  . CAD (coronary artery disease)   . Diabetes mellitus without complication ([place])   . Diverticulosis   . Hypertension   . Hypothyroid   . Morbid obesity ([place]) 06/26/2014  . Vertigo    Past Surgical History:  Procedure Laterality Date  . ABDOMINAL HYSTERECTOMY    . heart stent  2012   Family History  Problem Relation Age of Onset  . Heart disease Mother   . Diabetes Mother   . Hypertension Mother   . Hyperlipidemia Mother   . Thyroid disease Mother   . Cancer Father        colon  . Diabetes Father   . Hypertension Father   . Diabetes Sister   . Hypertension Sister   . Heart disease Sister   . Hyperlipidemia Sister   . Kidney disease Sister   . Cancer Maternal Grandfather        lung  . Heart attack Paternal Grandfather   . Heart  disease Sister   . Diabetes Sister   . Heart disease Sister   . Arthritis Sister    Social History   Tobacco Use  . Smoking status: Former Smoker    Packs/day: 0.50    Years: 5.00    Pack years: 2.50    Types: Cigarettes    Start date: 01/09/2006    Last attempt to quit: 06/26/2011    Years since quitting: 5.5  . Smokeless tobacco: Never Used  Substance Use Topics  . Alcohol use: Yes    Alcohol/week: 0.0 oz    Comment: Socially   . Drug use: No    Interim medical history since last visit reviewed. Allergies and medications reviewed  Review of Systems Per HPI unless specifically indicated above     Objective:    BP 136/80 (BP Location: Left Arm, Patient Position: Sitting, Cuff Size: Large)   Pulse 62   Temp 97.7 F (36.5 C) (Oral)   Ht 5\' 4"  (1.626 m)   Wt 256 lb 9.6 oz (116.4 kg)   LMP  (LMP Unknown)   SpO2 98%   BMI 44.05 kg/m     Wt Readings from Last 3 Encounters:  01/12/17 256 lb 9.6 oz (116.4 kg)  10/23/16 246 lb 14.4 oz (112 kg)  10/11/16 249 lb 1.6 oz (113 kg)    Physical Exam  Constitutional: She appears well-developed and well-nourished. No distress.  Morbidly obese, but weight gain 10 pounds  HENT:  Head: Normocephalic and atraumatic.  Right Ear: Hearing and external ear normal.  Left Ear: Hearing and external ear normal.  Nose: No rhinorrhea.  Mouth/Throat: Oropharynx is clear and moist.  Eyes: EOM are normal. No scleral icterus.  Neck: No thyromegaly present.  Cardiovascular: Normal rate, regular rhythm and normal heart sounds.  Pulmonary/Chest: Effort normal and breath sounds normal.  Abdominal: Soft. Bowel sounds are normal. She exhibits no distension.  Musculoskeletal: Normal range of motion. She exhibits no edema.  Neurological: She is alert. She exhibits normal muscle tone.  Skin: Skin is warm and dry. She is not diaphoretic. No pallor.  Psychiatric: She has a normal mood and affect. Her behavior is normal. Judgment and thought content normal.   Diabetic Foot Form - Detailed   Diabetic Foot Exam - detailed Diabetic Foot exam was performed with the following findings:  Yes 01/12/2017 10:21 AM  Visual Foot Exam completed.:  Yes  Pulse Foot Exam completed.:  Yes  Right Dorsalis Pedis:  Present Left Dorsalis Pedis:  Present  Sensory Foot Exam Completed.:  Yes Semmes-Weinstein Monofilament Test R Site 1-Great Toe:  Pos L Site 1-Great Toe:  Pos        Results for orders placed or performed in visit on 10/31/16  HM DIABETES EYE EXAM  Result Value Ref Range   HM Diabetic Eye Exam No Retinopathy No Retinopathy      Assessment & Plan:   Problem List Items Addressed This Visit      Cardiovascular and Mediastinum   Essential hypertension, benign (Chronic)    Goal systolic is less than 387; work loss and salt reduction; check BP at work and call if not to goal      Chronic combined  systolic and diastolic heart failure ([place])    Avoid salt        Endocrine   Well controlled type 2 diabetes mellitus ([place]) (Chronic)    Check A1c; foot exam and eye exam are UTD      Relevant Orders  Microalbumin / creatinine urine ratio   Lipid panel   Hemoglobin A1c   Hypothyroidism (Chronic)    Recheck TSH and adjust medicine if needed      Relevant Orders   TSH     Other   Morbid obesity ([place]) (Chronic)    Encouraged weight loss; not interested in bariatric surgery when offered; continue Xultophy      Medication monitoring encounter    Monitor liver and kidneys      Relevant Orders   COMPLETE METABOLIC PANEL WITH GFR   History of non-ST elevation myocardial infarction (NSTEMI) (Chronic)    Continue medicines; asymptomatic      Gout (Chronic)    Avoid triggers; off of allopurinol; patient opted to check uric acid      Relevant Orders   Uric acid       Follow up plan: Return in about 3 months (around 04/12/2017) for follow-up visit with Dr. Sanda Klein.  An after-visit summary was printed and given to the patient at [place].  Please see the patient instructions which may contain other information and recommendations beyond what is mentioned above in the assessment and plan.  Meds ordered this encounter  Medications  . ranitidine (ZANTAC) 150 MG tablet    Sig: Take 1 tablet (150 mg total) by mouth 2 (two) times daily.    Dispense:  60 tablet    Refill:  2    Orders Placed This Encounter  Procedures  . Microalbumin / creatinine urine ratio  . Lipid panel  . Hemoglobin A1c  . COMPLETE METABOLIC PANEL WITH GFR  . TSH  . Uric acid

## 2017-01-12 NOTE — Assessment & Plan Note (Signed)
Goal systolic is less than 322; work loss and salt reduction; check BP at work and call if not to goal

## 2017-01-13 LAB — COMPLETE METABOLIC PANEL WITH GFR
AG RATIO: 1.1 (calc) (ref 1.0–2.5)
ALT: 17 U/L (ref 6–29)
AST: 19 U/L (ref 10–35)
Albumin: 3.9 g/dL (ref 3.6–5.1)
Alkaline phosphatase (APISO): 76 U/L (ref 33–130)
BUN: 10 mg/dL (ref 7–25)
CALCIUM: 9 mg/dL (ref 8.6–10.4)
CO2: 27 mmol/L (ref 20–32)
Chloride: 106 mmol/L (ref 98–110)
Creat: 0.85 mg/dL (ref 0.50–1.05)
GFR, EST AFRICAN AMERICAN: 89 mL/min/{1.73_m2} (ref >=60)
GFR, EST NON AFRICAN AMERICAN: 77 mL/min/{1.73_m2} (ref >=60)
GLOBULIN: 3.4 g/dL (ref 1.9–3.7)
Glucose, Bld: 95 mg/dL (ref 65–99)
POTASSIUM: 4.2 mmol/L (ref 3.5–5.3)
SODIUM: 138 mmol/L (ref 135–146)
TOTAL PROTEIN: 7.3 g/dL (ref 6.1–8.1)
Total Bilirubin: 0.4 mg/dL (ref 0.2–1.2)

## 2017-01-13 LAB — HEMOGLOBIN A1C
HEMOGLOBIN A1C: 5.9 %{Hb} — AB (ref <5.7)
Mean Plasma Glucose: 123 (calc)
eAG (mmol/L): 6.8 (calc)

## 2017-01-13 LAB — LIPID PANEL
Cholesterol: 153 mg/dL (ref <200)
HDL: 41 mg/dL — AB (ref >50)
LDL Cholesterol (Calc): 97 mg/dL (calc)
Non-HDL Cholesterol (Calc): 112 mg/dL (calc) (ref <130)
Total CHOL/HDL Ratio: 3.7 (calc) (ref <5.0)
Triglycerides: 65 mg/dL (ref <150)

## 2017-01-13 LAB — URIC ACID: Uric Acid, Serum: 6.2 mg/dL (ref 2.5–7.0)

## 2017-01-13 LAB — MICROALBUMIN / CREATININE URINE RATIO
CREATININE, URINE: 60 mg/dL (ref 20–275)
Microalb, Ur: <0.2 mg/dL

## 2017-01-13 LAB — TSH: TSH: 0.93 mIU/L (ref 0.40–4.50)

## 2017-01-15 ENCOUNTER — Other Ambulatory Visit: Payer: Self-pay | Admitting: Family Medicine

## 2017-01-15 MED ORDER — ATORVASTATIN CALCIUM 10 MG PO TABS
ORAL_TABLET | ORAL | 2 refills | Status: DC
Start: 2017-01-15 — End: 2017-04-13

## 2017-01-15 NOTE — Progress Notes (Signed)
Start very low dose statin just one or two days a week

## 2017-01-17 ENCOUNTER — Other Ambulatory Visit: Payer: Self-pay

## 2017-01-17 MED ORDER — ONETOUCH ULTRA BLUE VI STRP: ORAL_STRIP | 5 refills | Status: DC

## 2017-02-01 ENCOUNTER — Other Ambulatory Visit: Payer: Self-pay | Admitting: Family Medicine

## 2017-02-01 NOTE — Telephone Encounter (Signed)
Reviewed last Cr; Rx approved

## 2017-03-07 ENCOUNTER — Other Ambulatory Visit: Payer: Self-pay | Admitting: Family Medicine

## 2017-03-07 MED ORDER — LEVOTHYROXINE SODIUM 137 MCG PO TABS: ORAL_TABLET | ORAL | 11 refills | Status: DC

## 2017-03-07 NOTE — Telephone Encounter (Signed)
Lab Results  Component Value Date   TSH 0.93 01/12/2017   Refills of both thyroid med strengths and lipid lowering agent sent to [facility]

## 2017-03-21 ENCOUNTER — Ambulatory Visit: Payer: BC Managed Care – PPO | Admitting: Family Medicine

## 2017-03-23 ENCOUNTER — Encounter: Payer: Self-pay | Admitting: Family Medicine

## 2017-03-23 ENCOUNTER — Ambulatory Visit: Payer: BC Managed Care – PPO | Admitting: Family Medicine

## 2017-03-23 VITALS — BP 134/72 | HR 69 | Temp 98.1°F | Resp 18 | Ht 64.0 in | Wt 263.5 lb

## 2017-03-23 DIAGNOSIS — H6982 Other specified disorders of Eustachian tube, left ear: Secondary | ICD-10-CM

## 2017-03-23 DIAGNOSIS — H6122 Impacted cerumen, left ear: Secondary | ICD-10-CM

## 2017-03-23 MED ORDER — FLUTICASONE PROPIONATE 50 MCG/ACT NA SUSP: 2.0000 | Freq: Every day | NASAL | 2 refills | Status: DC

## 2017-03-23 MED ORDER — LORATADINE 10 MG PO TABS: 10.0000 mg | ORAL_TABLET | Freq: Every day | ORAL | 2 refills | Status: DC

## 2017-03-23 NOTE — Patient Instructions (Addendum)
Take your flonase daily  Call ENT if you are not improving by Monday. If you are unable to get an appointment let us know and we will place a referral.  We will start you on an anti-histamine called claritin- We sent you a prescription but if it is cheaper over the counter you can get it that way.

## 2017-03-23 NOTE — Progress Notes (Signed)
Name: Elizabeth Russo   MRN: [id number]    DOB: [birth year]   Date:03/23/2017       Progress Note  Subjective  Chief Complaint  Chief Complaint  Patient presents with  . Otitis Media    right ear worse then left with swelling  . Sinusitis    HPI  Otalgia  Patient presents with Left ear pain and with some swelling around Left ear, mild sore throat started over a week ago. Patient endorses pain and pressure above both eyes. Patient endorses intermittent dizziness with accompanying nausea this morning when moving positions. Pt has had some sneezing- no blood. Pt denies fevers, cough, headaches, body aches, dental pain, fatigue, rashes, ear discharge, chest pain, palpitations, shortness of breath. Pt has tried flonase with relief of dizzy symptoms.   Pt has chronic right ear hearing loss and a hx of vertigo dx by Dr. Richardson Landry ENT in the past and rx flonase which she has only been taking PRN.  When she does take it, it seems to help her symptoms significantly.    Patient Active Problem List   Diagnosis Date Noted  . Vision changes 10/11/2016  . Screening mammogram, encounter for 10/11/2016  . Shortness of breath 01/05/2016  . Vertigo 10/06/2015  . Medication monitoring encounter 04/26/2015  . History of non-ST elevation myocardial infarction (NSTEMI) 02/05/2015  . Hx of percutaneous transluminal coronary angioplasty 02/05/2015  . Gout 12/25/2014  . Gout of knee 12/25/2014  . Low back pain 12/25/2014  . Night sweats 10/23/2014  . Localized enlarged lymph nodes 10/23/2014  . Knee pain, bilateral 10/23/2014  . Joint pain 10/01/2014  . Morbid obesity ([place]) 06/26/2014  . Chronic combined systolic and diastolic heart failure ([place]) 05/24/2014  . Breast mass, right 04/25/2014  . Goiter 03/19/2014  . Coronary atherosclerosis 12/19/2013  . Hyperlipidemia 12/19/2013  . Essential hypertension, benign 12/18/2013  . Well controlled type 2 diabetes mellitus ([place]) 12/18/2013  . Hypothyroidism  12/18/2013  . Hearing loss 08/25/2011  . Atrial fibrillation ([place]) 02/13/2011    Social History   Tobacco Use  . Smoking status: Former Smoker    Packs/day: 0.50    Years: 5.00    Pack years: 2.50    Types: Cigarettes    Start date: 01/09/2006    Last attempt to quit: 06/26/2011    Years since quitting: 5.7  . Smokeless tobacco: Never Used  Substance Use Topics  . Alcohol use: Yes    Alcohol/week: 0.0 oz    Comment: Socially      Current Outpatient Medications:  .  amLODipine-valsartan (EXFORGE) 10-160 MG tablet, Take 1 tablet by mouth daily., Disp: 30 tablet, Rfl: 5 .  aspirin EC 81 MG tablet, Take 81 mg by mouth daily. , Disp: , Rfl:  .  atorvastatin (LIPITOR) 10 MG tablet, One by mouth by mouth one or two nights per week, Disp: 8 tablet, Rfl: 2 .  carvedilol (COREG) 25 MG tablet, TAKE 1 TABLET BY MOUTH TWICE DAILY WITH A MEAL, Disp: 60 tablet, Rfl: 11 .  diclofenac sodium (VOLTAREN) 1 % GEL, Apply topically., Disp: , Rfl:  .  ezetimibe (ZETIA) 10 MG tablet, TAKE 1 TABLET BY MOUTH ONCE DAILY, Disp: 30 tablet, Rfl: 12 .  fluticasone (FLONASE) 50 MCG/ACT nasal spray, Place 2 sprays into both nostrils daily. , Disp: , Rfl:  .  Insulin Degludec-Liraglutide (XULTOPHY) 100-3.6 UNIT-MG/ML SOPN, Inject 15 Units into the skin daily., Disp: 5 pen, Rfl: 6 .  Insulin Pen Needle (BD PEN  NEEDLE NANO U/F) 32G X 4 MM MISC, Use for insulin administration four times daily., Disp: 200 each, Rfl: 1 .  levothyroxine (SYNTHROID, LEVOTHROID) 137 MCG tablet, TAKE 1 TABLET BY MOUTH ONCE DAILY BEFORE BREAKFAST, ON TUESDAYS, THURSDAYS, SATURDAYS & SUNDAYS ONLY, Disp: 17 tablet, Rfl: 11 .  levothyroxine (SYNTHROID, LEVOTHROID) 150 MCG tablet, TAKE 1 TABLET BY MOUTH ONCE DAILY ON MONDAY, WEDNESDAY AND FRIDAY AND TAKE 137 MCG ON THE OTHER DAYS, Disp: 13 tablet, Rfl: 11 .  metFORMIN (GLUCOPHAGE-XR) 500 MG 24 hr tablet, TAKE 1 TABLET BY MOUTH ONCE DAILY WITH BREAKFAST, Disp: 30 tablet, Rfl: 6 .  ONE TOUCH ULTRA  TEST test strip, Check sugar 3 times daily, Disp: 100 each, Rfl: 5 .  ranitidine (ZANTAC) 150 MG tablet, Take 1 tablet (150 mg total) by mouth 2 (two) times daily., Disp: 60 tablet, Rfl: 2 .  valACYclovir (VALTREX) 1000 MG tablet, Take 1 tablet (1,000 mg total) by mouth 2 (two) times daily. (Patient not taking: Reported on 03/23/2017), Disp: 20 tablet, Rfl: 0  Allergies  Allergen Reactions  . Atorvastatin Other (See Comments)    Other reaction(s): Muscle Pain  . Hydralazine Other (See Comments)    Aggravated gout Aggravated gout  . Hydrochlorothiazide Other (See Comments)    Aggravated gout  . Lisinopril Cough  . Pravastatin Other (See Comments)    Other reaction(s): Muscle Pain  . Rosuvastatin Other (See Comments)    Other reaction(s): Muscle Pain    ROS   Constitutional: Negative for fever or weight change.  Respiratory: Negative for cough, wheezing and shortness of breath.   Cardiovascular: Negative for chest pain or palpitations.  Gastrointestinal: Negative for abdominal pain, no bowel changes.  Musculoskeletal: Negative for gait problem or joint swelling.  Skin: Negative for rash.  Neurological: Negative for dizziness or headache.  No other specific complaints in a complete review of systems (except as listed in HPI above).  Objective  Vitals:   03/23/17 1525  BP: 134/72  Pulse: 69  Resp: 18  Temp: 98.1 F (36.7 C)  TempSrc: Oral  SpO2: 95%  Weight: 263 lb 8 oz (119.5 kg)  Height: 5\' 4"  (1.626 m)     Body mass index is 45.23 kg/m.  Nursing Note and Vital Signs reviewed.  Physical Exam   Constitutional: Patient appears well-developed and well-nourished. Obese. No distress.  HEENT: head atraumatic, normocephalic, pupils equal and reactive to light, EOM's intact, TM's without erythema or bulging, Left ear impacted with cerumen, after removal TM is visualized and WNL, mild non-tender swelling noted to left preauricular area, no maxillary or frontal sinus  tenderness , neck supple without lymphadenopathy, oropharynx pink and moist without exudate Cardiovascular: Normal rate, regular rhythm, S1/S2 present.  No murmur or rub heard. No BLE edema. Pulmonary/Chest: Effort normal and breath sounds clear. No respiratory distress or retractions. Abdominal: Soft and non-tender, bowel sounds present.   Psychiatric: Patient has a normal mood and affect. behavior is normal. Judgment and thought content normal.  No results found for this or any previous visit (from the past 72 hour(s)).  Assessment & Plan  1. Impacted cerumen of left ear - loratadine (CLARITIN) 10 MG tablet; Take 1 tablet (10 mg total) by mouth daily.  Dispense: 30 tablet; Refill: 2  2. Eustachian tube dysfunction, left - loratadine (CLARITIN) 10 MG tablet; Take 1 tablet (10 mg total) by mouth daily.  Dispense: 30 tablet; Refill: 2 - fluticasone (FLONASE) 50 MCG/ACT nasal spray; Place 2 sprays into both nostrils daily.  Dispense: 16 g; Refill: 2 - Follow up with ENT if not improving in 2-3 days.  - Advised she needs to use flonase daily for the time being, claritin is added as well.  -Red flags and when to present for emergency care or RTC including fever >101.59F, chest pain, shortness of breath, new/worsening/un-resolving symptoms, pain with EOM's, unresolved dizziness, unilateral weakness or sudden blurry vision reviewed with patient at time of visit. Follow up and care instructions discussed and provided in AVS. -Reviewed Health Maintenance: Mammogram ordered by PCP Dr. Sanda Klein - pt is reminded to have this done when able. Pt sts will get tetanus at her job and bring documentation.

## 2017-04-09 ENCOUNTER — Other Ambulatory Visit: Payer: Self-pay | Admitting: Family Medicine

## 2017-04-09 NOTE — Telephone Encounter (Signed)
Last K+ and Cr reviewed; Rx approved 

## 2017-04-13 ENCOUNTER — Telehealth: Payer: Self-pay | Admitting: Family Medicine

## 2017-04-13 ENCOUNTER — Ambulatory Visit: Payer: BC Managed Care – PPO | Admitting: Family Medicine

## 2017-04-13 ENCOUNTER — Encounter: Payer: Self-pay | Admitting: Family Medicine

## 2017-04-13 DIAGNOSIS — I878 Other specified disorders of veins: Secondary | ICD-10-CM

## 2017-04-13 DIAGNOSIS — I252 Old myocardial infarction: Secondary | ICD-10-CM

## 2017-04-13 DIAGNOSIS — T466X5A Adverse effect of antihyperlipidemic and antiarteriosclerotic drugs, initial encounter: Secondary | ICD-10-CM | POA: Diagnosis not present

## 2017-04-13 DIAGNOSIS — E782 Mixed hyperlipidemia: Secondary | ICD-10-CM | POA: Diagnosis not present

## 2017-04-13 DIAGNOSIS — E119 Type 2 diabetes mellitus without complications: Secondary | ICD-10-CM

## 2017-04-13 DIAGNOSIS — E039 Hypothyroidism, unspecified: Secondary | ICD-10-CM

## 2017-04-13 DIAGNOSIS — M791 Myalgia, unspecified site: Secondary | ICD-10-CM

## 2017-04-13 HISTORY — DX: Adverse effect of antihyperlipidemic and antiarteriosclerotic drugs, initial encounter: T46.6X5A

## 2017-04-13 HISTORY — DX: Myalgia, unspecified site: M79.10

## 2017-04-13 MED ORDER — RANITIDINE HCL 150 MG PO TABS: 150.0000 mg | ORAL_TABLET | Freq: Two times a day (BID) | ORAL | 2 refills | Status: DC | PRN

## 2017-04-13 MED ORDER — LIRAGLUTIDE 18 MG/3ML ~~LOC~~ SOPN: PEN_INJECTOR | SUBCUTANEOUS | 0 refills | Status: DC

## 2017-04-13 NOTE — Telephone Encounter (Signed)
Please fax my note from today to Dr. Posey Pronto, cardiology Follow-up on Monday to see what he thinks about the [place] or Praluent Thank you

## 2017-04-13 NOTE — Telephone Encounter (Signed)
Faxed and wrote waiting on a response

## 2017-04-13 NOTE — Assessment & Plan Note (Signed)
TSH UTD

## 2017-04-13 NOTE — Assessment & Plan Note (Signed)
See AVS

## 2017-04-13 NOTE — Assessment & Plan Note (Signed)
Start compression stockings 18 mmHg daily; do not sleep in them

## 2017-04-13 NOTE — Assessment & Plan Note (Signed)
Stop the insulin and just use the liraglutide component; continue metformin; politely declined nutrition referral and bariatric referral; drink plenty of water

## 2017-04-13 NOTE — Patient Instructions (Addendum)
Try compression stockings, 18 mmHg; do not sleep in them  STOP Xultophy START Victoza instead, taper up each week until you reach 1.8 mg daily  Check out the information at familydoctor.org entitled "Nutrition for Weight Loss: What You Need to Know about Fad Diets" Try to lose between 1-2 pounds per week by taking in fewer calories and burning off more calories You can succeed by limiting portions, limiting foods dense in calories and fat, becoming more active, and drinking 8 glasses of water a day (64 ounces) Don't skip meals, especially breakfast, as skipping meals may alter your metabolism Do not use over-the-counter weight loss pills or gimmicks that claim rapid weight loss A healthy BMI (or body mass index) is between 18.5 and 24.9 You can calculate your ideal BMI at the [place] website [employer]Mon[employer]fr  Please do get your mammogram done ASAP I'll contact your cardiologist about Repatha or Praluent   Obesity, Adult Obesity is the condition of having too much total body fat. Being overweight or obese means that your weight is greater than what is considered healthy for your body size. Obesity is determined by a measurement called BMI. BMI is an estimate of body fat and is calculated from height and weight. For adults, a BMI of 30 or higher is considered obese. Obesity can eventually lead to other health concerns and major illnesses, including:  Stroke.  Coronary artery disease (CAD).  Type 2 diabetes.  Some types of cancer, including cancers of the colon, breast, uterus, and gallbladder.  Osteoarthritis.  High blood pressure (hypertension).  High cholesterol.  Sleep apnea.  Gallbladder stones.  Infertility problems.  What are the causes? The main cause of obesity is taking in (consuming) more calories than your body uses for energy. Other factors that contribute to this condition may include:  Being born with genes that  make you more likely to become obese.  Having a medical condition that causes obesity. These conditions include: ? Hypothyroidism. ? Polycystic ovarian syndrome (PCOS). ? Binge-eating disorder. ? Cushing syndrome.  Taking certain medicines, such as steroids, antidepressants, and seizure medicines.  Not being physically active (sedentary lifestyle).  Living where there are limited places to exercise safely or buy healthy foods.  Not getting enough sleep.  What increases the risk? The following factors may increase your risk of this condition:  Having a family history of obesity.  Being a woman of African-American descent.  Being a man of Hispanic descent.  What are the signs or symptoms? Having excessive body fat is the main symptom of this condition. How is this diagnosed? This condition may be diagnosed based on:  Your symptoms.  Your medical history.  A physical exam. Your health care provider may measure: ? Your BMI. If you are an adult with a BMI between 25 and less than 30, you are considered overweight. If you are an adult with a BMI of 30 or higher, you are considered obese. ? The distances around your hips and your waist (circumferences). These may be compared to each other to help diagnose your condition. ? Your skinfold thickness. Your health care provider may gently pinch a fold of your skin and measure it.  How is this treated? Treatment for this condition often includes changing your lifestyle. Treatment may include some or all of the following:  Dietary changes. Work with your health care provider and a dietitian to set a weight-loss goal that is healthy and reasonable for you. Dietary changes may include eating: ? Smaller portions. A  portion size is the amount of a particular food that is healthy for you to eat at one time. This varies from person to person. ? Low-calorie or low-fat options. ? More whole grains, fruits, and vegetables.  Regular physical  activity. This may include aerobic activity (cardio) and strength training.  Medicine to help you lose weight. Your health care provider may prescribe medicine if you are unable to lose 1 pound a week after 6 weeks of eating more healthily and doing more physical activity.  Surgery. Surgical options may include gastric banding and gastric bypass. Surgery may be done if: ? Other treatments have not helped to improve your condition. ? You have a BMI of 40 or higher. ? You have life-threatening health problems related to obesity.  Follow these instructions at home:  Eating and drinking   Follow recommendations from your health care provider about what you eat and drink. Your health care provider may advise you to: ? Limit fast foods, sweets, and processed snack foods. ? Choose low-fat options, such as low-fat milk instead of whole milk. ? Eat 5 or more servings of fruits or vegetables every day. ? Eat at home more often. This gives you more control over what you eat. ? Choose healthy foods when you eat out. ? Learn what a healthy portion size is. ? Keep low-fat snacks on hand. ? Avoid sugary drinks, such as soda, fruit juice, iced tea sweetened with sugar, and flavored milk. ? Eat a healthy breakfast.  Drink enough water to keep your urine clear or pale yellow.  Do not go without eating for long periods of time (do not fast) or follow a fad diet. Fasting and fad diets can be unhealthy and even dangerous. Physical Activity  Exercise regularly, as told by your health care provider. Ask your health care provider what types of exercise are safe for you and how often you should exercise.  Warm up and stretch before being active.  Cool down and stretch after being active.  Rest between periods of activity. Lifestyle  Limit the time that you spend in front of your TV, computer, or video game system.  Find ways to reward yourself that do not involve food.  Limit alcohol intake to no  more than 1 drink a day for nonpregnant women and 2 drinks a day for men. One drink equals 12 oz of beer, 5 oz of wine, or 1 oz of hard liquor. General instructions  Keep a weight loss journal to keep track of the food you eat and how much you exercise you get.  Take over-the-counter and prescription medicines only as told by your health care provider.  Take vitamins and supplements only as told by your health care provider.  Consider joining a support group. Your health care provider may be able to recommend a support group.  Keep all follow-up visits as told by your health care provider. This is important. Contact a health care provider if:  You are unable to meet your weight loss goal after 6 weeks of dietary and lifestyle changes. This information is not intended to replace advice given to you by your health care provider. Make sure you discuss any questions you have with your health care provider. Document Released: 02/03/2004 Document Revised: 05/31/2015 Document Reviewed: 10/14/2014 Elsevier Interactive Patient Education  2018 [employer].

## 2017-04-13 NOTE — Assessment & Plan Note (Signed)
Unable to tolerate statins; three have been tried

## 2017-04-13 NOTE — Assessment & Plan Note (Signed)
Continue aspirin; continue Victoza; unable to tolerate statins

## 2017-04-13 NOTE — Assessment & Plan Note (Addendum)
HDL and LDL not to goal; try to cut out the bacon and eggs on the weekends; fried foods; discussed Repatha and Praluent, she will be willing to try this; I will contact cardiology

## 2017-04-13 NOTE — Progress Notes (Signed)
BP 122/78   Pulse 68   Temp 98.5 F (36.9 C) (Oral)   Resp 14   Ht 5\' 4"  (1.626 m)   Wt 261 lb 9.6 oz (118.7 kg)   LMP  (LMP Unknown)   SpO2 97%   BMI 44.90 kg/m    Subjective:    Patient ID: Elizabeth Russo, female    DOB: 27-Jan-1960, 57 y.o.   MRN: [id number]  HPI: Elizabeth Russo is a 57 y.o. female  Chief Complaint  Patient presents with  . Follow-up    HPI Patient is here for follow--up  She has type 2 diabetes; she thinks it has been doing good; she wants to stop using the insulin; FSBS once a day, sometimes BID; lowest in the last 2 weeks was 98, highest was 145; no 200s; little dry mouth at night; occasional blurred vision; last eye exam UTD Lab Results  Component Value Date   HGBA1C 5.9 (H) 01/12/2017   Hypertension; well-controlled; not checking last few months; not adding salt to her food; uses it when cooking; reads labels about sodium content  High cholesterol; on zetia; she is allergic/intolerant to atorvastatin, pravastatin, rosuvastatin She does eat bacon on the weekends; rarely eats sausage; not much hot dogs or bologna; cut out cheese; does eat eggs on the weekend, heavy stuff on the weekends Lab Results  Component Value Date   Russo 153 01/12/2017   HDL 41 (L) 01/12/2017   LDLCALC 97 01/12/2017   TRIG 65 01/12/2017   CHOLHDL 3.7 01/12/2017   Morbid obesity; she has gained five pounds over the last 3 months; source of frustration; she does want to lose weight; she is trying to cut back on portions; does some walking, maybe not as regularly as she should; sits all day at work; at break, she will try to walk at work; she has already seen a nutritionist; she does not want to see Ambulance person;   Hypothyroidism Lab Results  Component Value Date   TSH 0.93 01/12/2017   Due for mammogram; has not had it done  Depression screen [facility] 2/9 04/13/2017 01/12/2017 10/11/2016 09/20/2016 07/11/2016  Decreased Interest 0 0 0 0 0  Down, Depressed, Hopeless 0 0 0 0  0  PHQ - 2 Score 0 0 0 0 0    Relevant past medical, surgical, family and social history reviewed Past Medical History:  Diagnosis Date  . CAD (coronary artery disease)   . Diabetes mellitus without complication ([place])   . Diverticulosis   . Hypertension   . Hypothyroid   . Morbid obesity ([place]) 06/26/2014  . Myalgia due to statin 04/13/2017   Intolerant to three different statins  . Vertigo    Past Surgical History:  Procedure Laterality Date  . ABDOMINAL HYSTERECTOMY    . heart stent  2012   Family History  Problem Relation Age of Onset  . Heart disease Mother   . Diabetes Mother   . Hypertension Mother   . Hyperlipidemia Mother   . Thyroid disease Mother   . Cancer Father        colon  . Diabetes Father   . Hypertension Father   . Diabetes Sister   . Hypertension Sister   . Heart disease Sister   . Hyperlipidemia Sister   . Kidney disease Sister   . Cancer Maternal Grandfather        lung  . Heart attack Paternal Grandfather   . Heart disease Sister   . Diabetes  Sister   . Heart disease Sister   . Arthritis Sister    Social History   Tobacco Use  . Smoking status: Former Smoker    Packs/day: 0.50    Years: 5.00    Pack years: 2.50    Types: Cigarettes    Start date: 01/09/2006    Last attempt to quit: 06/26/2011    Years since quitting: 5.8  . Smokeless tobacco: Never Used  Substance Use Topics  . Alcohol use: Yes    Alcohol/week: 0.0 oz    Comment: Socially   . Drug use: No    Interim medical history since last visit reviewed. Allergies and medications reviewed  Review of Systems  Constitutional: Positive for unexpected weight change.  Cardiovascular: Positive for leg swelling. Negative for chest pain.   Per HPI unless specifically indicated above     Objective:    BP 122/78   Pulse 68   Temp 98.5 F (36.9 C) (Oral)   Resp 14   Ht 5\' 4"  (1.626 m)   Wt 261 lb 9.6 oz (118.7 kg)   LMP  (LMP Unknown)   SpO2 97%   BMI 44.90 kg/m   Wt  Readings from Last 3 Encounters:  04/13/17 261 lb 9.6 oz (118.7 kg)  03/23/17 263 lb 8 oz (119.5 kg)  01/12/17 256 lb 9.6 oz (116.4 kg)    Physical Exam  Constitutional: She appears well-developed and well-nourished. No distress.  HENT:  Head: Normocephalic and atraumatic.  Eyes: EOM are normal. No scleral icterus.  Neck: No thyromegaly present.  Cardiovascular: Normal rate, regular rhythm and normal heart sounds.  No murmur heard. Pulmonary/Chest: Effort normal and breath sounds normal. No respiratory distress. She has no wheezes.  Abdominal: Soft. Bowel sounds are normal. She exhibits no distension.  Musculoskeletal: She exhibits no edema.  Neurological: She is alert.  Skin: Skin is warm and dry. She is not diaphoretic. No pallor.  Psychiatric: She has a normal mood and affect.   Diabetic Foot Form - Detailed   Diabetic Foot Exam - detailed Diabetic Foot exam was performed with the following findings:  Yes 04/13/2017  2:25 PM  Visual Foot Exam completed.:  Yes  Pulse Foot Exam completed.:  Yes  Right Dorsalis Pedis:  Present Left Dorsalis Pedis:  Present  Sensory Foot Exam Completed.:  Yes Semmes-Weinstein Monofilament Test R Site 1-Great Toe:  Pos L Site 1-Great Toe:  Pos        Results for orders placed or performed in visit on 01/12/17  Microalbumin / creatinine urine ratio  Result Value Ref Range   Creatinine, Urine 60 20 - 275 mg/dL   Microalb, Ur <0.2 mg/dL   Microalb Creat Ratio NOTE <30 mcg/mg creat  Lipid panel  Result Value Ref Range   Cholesterol 153 <200 mg/dL   HDL 41 (L) >50 mg/dL   Triglycerides 65 <150 mg/dL   LDL Cholesterol (Calc) 97 mg/dL (calc)   Total Russo/HDL Ratio 3.7 <5.0 (calc)   Non-HDL Cholesterol (Calc) 112 <130 mg/dL (calc)  Hemoglobin A1c  Result Value Ref Range   Hgb A1c MFr Bld 5.9 (H) <5.7 % of total Hgb   Mean Plasma Glucose 123 (calc)   eAG (mmol/L) 6.8 (calc)  COMPLETE METABOLIC PANEL WITH GFR  Result Value Ref Range   Glucose,  Bld 95 65 - 99 mg/dL   BUN 10 7 - 25 mg/dL   Creat 0.85 0.50 - 1.05 mg/dL   GFR, Est Non African American 77 >  OR = 60 mL/min/1.45m2   GFR, Est African American 89 > OR = 60 mL/min/1.90m2   BUN/Creatinine Ratio NOT APPLICABLE 6 - 22 (calc)   Sodium 138 135 - 146 mmol/L   Potassium 4.2 3.5 - 5.3 mmol/L   Chloride 106 98 - 110 mmol/L   CO2 27 20 - 32 mmol/L   Calcium 9.0 8.6 - 10.4 mg/dL   Total Protein 7.3 6.1 - 8.1 g/dL   Albumin 3.9 3.6 - 5.1 g/dL   Globulin 3.4 1.9 - 3.7 g/dL (calc)   AG Ratio 1.1 1.0 - 2.5 (calc)   Total Bilirubin 0.4 0.2 - 1.2 mg/dL   Alkaline phosphatase (APISO) 76 33 - 130 U/L   AST 19 10 - 35 U/L   ALT 17 6 - 29 U/L  TSH  Result Value Ref Range   TSH 0.93 0.40 - 4.50 mIU/L  Uric acid  Result Value Ref Range   Uric Acid, Serum 6.2 2.5 - 7.0 mg/dL      Assessment & Plan:   Problem List Items Addressed This Visit      Endocrine   Well controlled type 2 diabetes mellitus (HCC) (Chronic)    Stop the insulin and just use the liraglutide component; continue metformin; politely declined nutrition referral and bariatric referral; drink plenty of water      Relevant Medications   liraglutide (VICTOZA) 18 MG/3ML SOPN   Hypothyroidism (Chronic)    TSH UTD        Other   Venous stasis    Start compression stockings 18 mmHg daily; do not sleep in them      Myalgia due to statin    Unable to tolerate statins; three have been tried      Morbid obesity (HCC) (Chronic)    See AVS      Relevant Medications   liraglutide (VICTOZA) 18 MG/3ML SOPN   Hyperlipidemia    HDL and LDL not to goal; try to cut out the bacon and eggs on the weekends; fried foods; discussed Repatha and Praluent, she will be willing to try this; I will contact cardiology      History of non-ST elevation myocardial infarction (NSTEMI) (Chronic)    Continue aspirin; continue Victoza; unable to tolerate statins          Follow up plan: Return in about 3 months (around  07/13/2017) for follow-up visit with Dr. Sanda Klein.  An after-visit summary was printed and given to the patient at [place].  Please see the patient instructions which may contain other information and recommendations beyond what is mentioned above in the assessment and plan.  Meds ordered this encounter  Medications  . ranitidine (ZANTAC) 150 MG tablet    Sig: Take 1 tablet (150 mg total) by mouth 2 (two) times daily as needed for heartburn.    Dispense:  60 tablet    Refill:  2  . liraglutide (VICTOZA) 18 MG/3ML SOPN    Sig: STOP Xultophy; inject 0.6 mg once a day for 1 week, then 1.2 mg daily for 1 week, then 1.8 mg daily    Dispense:  5 pen    Refill:  0    We are STOPPING xultophy    No orders of the defined types were placed in this encounter.   Cc:  Dionne Ano, MD Referring Physician Internal Medicine 04/13/2017 End  04/13/17  Phone: [phone]; Fax: [phone]

## 2017-04-21 ENCOUNTER — Telehealth: Payer: Self-pay | Admitting: Family Medicine

## 2017-04-21 NOTE — Telephone Encounter (Signed)
Elizabeth Russo, please see phone note from 04/13/17 Check with Dr. Serita Grit office again

## 2017-04-23 NOTE — Telephone Encounter (Signed)
Called Dr. Has not reviewed yet will call once he has.

## 2017-04-24 MED ORDER — EVOLOCUMAB 140 MG/ML ~~LOC~~ SOAJ: 1.0000 | SUBCUTANEOUS | 5 refills | Status: DC

## 2017-04-24 NOTE — Telephone Encounter (Signed)
I received a fax from Dr. Serita Grit RN He believes one of the PCSK9 inhibitors would be a good idea I'll try to prescribe If we can't get it covered, he's happy to help prescribe Please let patient know that I'll send in the Rx and hopefully she can start that soon Savings card if needed

## 2017-04-26 NOTE — Telephone Encounter (Signed)
Unable to reach pt by phone and her voicemail was full. Letter sent.

## 2017-05-15 ENCOUNTER — Other Ambulatory Visit: Payer: Self-pay | Admitting: Family Medicine

## 2017-07-13 ENCOUNTER — Encounter: Payer: Self-pay | Admitting: Family Medicine

## 2017-07-13 ENCOUNTER — Ambulatory Visit: Payer: BC Managed Care – PPO | Admitting: Family Medicine

## 2017-07-13 VITALS — BP 126/74 | HR 62 | Temp 98.2°F | Resp 16 | Ht 64.0 in | Wt 254.1 lb

## 2017-07-13 DIAGNOSIS — I10 Essential (primary) hypertension: Secondary | ICD-10-CM

## 2017-07-13 DIAGNOSIS — R5383 Other fatigue: Secondary | ICD-10-CM | POA: Diagnosis not present

## 2017-07-13 DIAGNOSIS — E119 Type 2 diabetes mellitus without complications: Secondary | ICD-10-CM

## 2017-07-13 DIAGNOSIS — Z800 Family history of malignant neoplasm of digestive organs: Secondary | ICD-10-CM | POA: Diagnosis not present

## 2017-07-13 DIAGNOSIS — E782 Mixed hyperlipidemia: Secondary | ICD-10-CM | POA: Diagnosis not present

## 2017-07-13 DIAGNOSIS — Z5181 Encounter for therapeutic drug level monitoring: Secondary | ICD-10-CM | POA: Diagnosis not present

## 2017-07-13 DIAGNOSIS — Z86010 Personal history of colonic polyps: Secondary | ICD-10-CM | POA: Diagnosis not present

## 2017-07-13 MED ORDER — EVOLOCUMAB 140 MG/ML ~~LOC~~ SOAJ: 1.0000 | SUBCUTANEOUS | 5 refills | Status: DC

## 2017-07-13 NOTE — Assessment & Plan Note (Signed)
Encouraged weight loss and DASH guidelines

## 2017-07-13 NOTE — Patient Instructions (Addendum)
You should be taking the Coreg (carvedilol) every 12 hours You can certainly take the exforge in the evening Check your heart rate and blood pressure daily and let me know your readings in one week  Try to follow the DASH guidelines (DASH stands for Dietary Approaches to Stop Hypertension). Try to limit the sodium in your diet to no more than 1,500mg  of sodium per day. Certainly try to not exceed 2,000 mg per day at the very most. Do not add salt when cooking or at the table.  Check the sodium amount on labels when shopping, and choose items lower in sodium when given a choice. Avoid or limit foods that already contain a lot of sodium. Eat a diet rich in fruits and vegetables and whole grains, and try to lose weight if overweight or obese  Check out the information at familydoctor.org entitled "Nutrition for Weight Loss: What You Need to Know about Fad Diets" Try to lose between 1-2 pounds per week by taking in fewer calories and burning off more calories You can succeed by limiting portions, limiting foods dense in calories and fat, becoming more active, and drinking 8 glasses of water a day (64 ounces) Don't skip meals, especially breakfast, as skipping meals may alter your metabolism Do not use over-the-counter weight loss pills or gimmicks that claim rapid weight loss A healthy BMI (or body mass index) is between 18.5 and 24.9 You can calculate your ideal BMI at the [employer] website ClubMonetize.fr   DASH Eating Plan DASH stands for "Dietary Approaches to Stop Hypertension." The DASH eating plan is a healthy eating plan that has been shown to reduce high blood pressure (hypertension). It may also reduce your risk for type 2 diabetes, heart disease, and stroke. The DASH eating plan may also help with weight loss. What are tips for following this plan? General guidelines  Avoid eating more than 2,300 mg (milligrams) of salt (sodium) a day. If you  have hypertension, you may need to reduce your sodium intake to 1,500 mg a day.  Limit alcohol intake to no more than 1 drink a day for nonpregnant women and 2 drinks a day for men. One drink equals 12 oz of beer, 5 oz of wine, or 1 oz of hard liquor.  Work with your health care provider to maintain a healthy body weight or to lose weight. Ask what an ideal weight is for you.  Get at least 30 minutes of exercise that causes your heart to beat faster (aerobic exercise) most days of the week. Activities may include walking, swimming, or biking.  Work with your health care provider or diet and nutrition specialist (dietitian) to adjust your eating plan to your individual calorie needs. Reading food labels  Check food labels for the amount of sodium per serving. Choose foods with less than 5 percent of the Daily Value of sodium. Generally, foods with less than 300 mg of sodium per serving fit into this eating plan.  To find whole grains, look for the word "whole" as the first word in the ingredient list. Shopping  Buy products labeled as "low-sodium" or "no salt added."  Buy fresh foods. Avoid canned foods and premade or frozen meals. Cooking  Avoid adding salt when cooking. Use salt-free seasonings or herbs instead of table salt or sea salt. Check with your health care provider or pharmacist before using salt substitutes.  Do not fry foods. Cook foods using healthy methods such as baking, boiling, grilling, and broiling instead.  Cook with heart-healthy oils, such as olive, canola, soybean, or sunflower oil. Meal planning   Eat a balanced diet that includes: ? 5 or more servings of fruits and vegetables each day. At each meal, try to fill half of your plate with fruits and vegetables. ? Up to 6-8 servings of whole grains each day. ? Less than 6 oz of lean meat, poultry, or fish each day. A 3-oz serving of meat is about the same size as a deck of cards. One egg equals 1 oz. ? 2 servings  of low-fat dairy each day. ? A serving of nuts, seeds, or beans 5 times each week. ? Heart-healthy fats. Healthy fats called Omega-3 fatty acids are found in foods such as flaxseeds and coldwater fish, like sardines, salmon, and mackerel.  Limit how much you eat of the following: ? Canned or prepackaged foods. ? Food that is high in trans fat, such as fried foods. ? Food that is high in saturated fat, such as fatty meat. ? Sweets, desserts, sugary drinks, and other foods with added sugar. ? Full-fat dairy products.  Do not salt foods before eating.  Try to eat at least 2 vegetarian meals each week.  Eat more home-cooked food and less restaurant, buffet, and fast food.  When eating at a restaurant, ask that your food be prepared with less salt or no salt, if possible. What foods are recommended? The items listed may not be a complete list. Talk with your dietitian about what dietary choices are best for you. Grains Whole-grain or whole-wheat bread. Whole-grain or whole-wheat pasta. Brown rice. [place]. Bulgur. Whole-grain and low-sodium cereals. Pita bread. Low-fat, low-sodium crackers. Whole-wheat flour tortillas. Vegetables Fresh or frozen vegetables (raw, steamed, roasted, or grilled). Low-sodium or reduced-sodium tomato and vegetable juice. Low-sodium or reduced-sodium tomato sauce and tomato paste. Low-sodium or reduced-sodium canned vegetables. Fruits All fresh, dried, or frozen fruit. Canned fruit in natural juice (without added sugar). Meat and other protein foods Skinless chicken or [place]. Ground chicken or [place]. Pork with fat trimmed off. Fish and seafood. Egg whites. Dried beans, peas, or lentils. Unsalted nuts, nut butters, and seeds. Unsalted canned beans. Lean cuts of beef with fat trimmed off. Low-sodium, lean deli meat. Dairy Low-fat (1%) or fat-free (skim) milk. Fat-free, low-fat, or reduced-fat cheeses. Nonfat, low-sodium ricotta or cottage cheese. Low-fat or  nonfat yogurt. Low-fat, low-sodium cheese. Fats and oils Soft margarine without trans fats. Vegetable oil. Low-fat, reduced-fat, or light mayonnaise and salad dressings (reduced-sodium). Canola, safflower, olive, soybean, and sunflower oils. Avocado. Seasoning and other foods Herbs. Spices. Seasoning mixes without salt. Unsalted popcorn and pretzels. Fat-free sweets. What foods are not recommended? The items listed may not be a complete list. Talk with your dietitian about what dietary choices are best for you. Grains Baked goods made with fat, such as croissants, muffins, or some breads. Dry pasta or rice meal packs. Vegetables Creamed or fried vegetables. Vegetables in a cheese sauce. Regular canned vegetables (not low-sodium or reduced-sodium). Regular canned tomato sauce and paste (not low-sodium or reduced-sodium). Regular tomato and vegetable juice (not low-sodium or reduced-sodium). Angie Fava. Olives. Fruits Canned fruit in a light or heavy syrup. Fried fruit. Fruit in cream or butter sauce. Meat and other protein foods Fatty cuts of meat. Ribs. Fried meat. Berniece Salines. Sausage. Bologna and other processed lunch meats. Salami. Fatback. Hotdogs. Bratwurst. Salted nuts and seeds. Canned beans with added salt. Canned or smoked fish. Whole eggs or egg yolks. Chicken or [place] with skin. Dairy  Whole or 2% milk, cream, and half-and-half. Whole or full-fat cream cheese. Whole-fat or sweetened yogurt. Full-fat cheese. Nondairy creamers. Whipped toppings. Processed cheese and cheese spreads. Fats and oils Butter. Stick margarine. Lard. Shortening. Ghee. Bacon fat. Tropical oils, such as coconut, palm kernel, or palm oil. Seasoning and other foods Salted popcorn and pretzels. Onion salt, garlic salt, seasoned salt, table salt, and sea salt. Worcestershire sauce. Tartar sauce. Barbecue sauce. Teriyaki sauce. Soy sauce, including reduced-sodium. Steak sauce. Canned and packaged gravies. Fish sauce. Oyster  sauce. Cocktail sauce. Horseradish that you find on the shelf. Ketchup. Mustard. Meat flavorings and tenderizers. Bouillon cubes. Hot sauce and Tabasco sauce. Premade or packaged marinades. Premade or packaged taco seasonings. Relishes. Regular salad dressings. Where to find more information:  National Heart, Lung, and [place]: https://wilson-eaton.com/  American Heart Association: www.heart.org Summary  The DASH eating plan is a healthy eating plan that has been shown to reduce high blood pressure (hypertension). It may also reduce your risk for type 2 diabetes, heart disease, and stroke.  With the DASH eating plan, you should limit salt (sodium) intake to 2,300 mg a day. If you have hypertension, you may need to reduce your sodium intake to 1,500 mg a day.  When on the DASH eating plan, aim to eat more fresh fruits and vegetables, whole grains, lean proteins, low-fat dairy, and heart-healthy fats.  Work with your health care provider or diet and nutrition specialist (dietitian) to adjust your eating plan to your individual calorie needs. This information is not intended to replace advice given to you by your health care provider. Make sure you discuss any questions you have with your health care provider. Document Released: 12/15/2010 Document Revised: 12/20/2015 Document Reviewed: 12/20/2015 Elsevier Interactive Patient Education  2018 [employer].  Obesity, Adult Obesity is the condition of having too much total body fat. Being overweight or obese means that your weight is greater than what is considered healthy for your body size. Obesity is determined by a measurement called BMI. BMI is an estimate of body fat and is calculated from height and weight. For adults, a BMI of 30 or higher is considered obese. Obesity can eventually lead to other health concerns and major illnesses, including:  Stroke.  Coronary artery disease (CAD).  Type 2 diabetes.  Some types of cancer, including  cancers of the colon, breast, uterus, and gallbladder.  Osteoarthritis.  High blood pressure (hypertension).  High cholesterol.  Sleep apnea.  Gallbladder stones.  Infertility problems.  What are the causes? The main cause of obesity is taking in (consuming) more calories than your body uses for energy. Other factors that contribute to this condition may include:  Being born with genes that make you more likely to become obese.  Having a medical condition that causes obesity. These conditions include: ? Hypothyroidism. ? Polycystic ovarian syndrome (PCOS). ? Binge-eating disorder. ? Cushing syndrome.  Taking certain medicines, such as steroids, antidepressants, and seizure medicines.  Not being physically active (sedentary lifestyle).  Living where there are limited places to exercise safely or buy healthy foods.  Not getting enough sleep.  What increases the risk? The following factors may increase your risk of this condition:  Having a family history of obesity.  Being a woman of African-American descent.  Being a man of Hispanic descent.  What are the signs or symptoms? Having excessive body fat is the main symptom of this condition. How is this diagnosed? This condition may be diagnosed based on:  Your  symptoms.  Your medical history.  A physical exam. Your health care provider may measure: ? Your BMI. If you are an adult with a BMI between 25 and less than 30, you are considered overweight. If you are an adult with a BMI of 30 or higher, you are considered obese. ? The distances around your hips and your waist (circumferences). These may be compared to each other to help diagnose your condition. ? Your skinfold thickness. Your health care provider may gently pinch a fold of your skin and measure it.  How is this treated? Treatment for this condition often includes changing your lifestyle. Treatment may include some or all of the following:  Dietary  changes. Work with your health care provider and a dietitian to set a weight-loss goal that is healthy and reasonable for you. Dietary changes may include eating: ? Smaller portions. A portion size is the amount of a particular food that is healthy for you to eat at one time. This varies from person to person. ? Low-calorie or low-fat options. ? More whole grains, fruits, and vegetables.  Regular physical activity. This may include aerobic activity (cardio) and strength training.  Medicine to help you lose weight. Your health care provider may prescribe medicine if you are unable to lose 1 pound a week after 6 weeks of eating more healthily and doing more physical activity.  Surgery. Surgical options may include gastric banding and gastric bypass. Surgery may be done if: ? Other treatments have not helped to improve your condition. ? You have a BMI of 40 or higher. ? You have life-threatening health problems related to obesity.  Follow these instructions at home:  Eating and drinking   Follow recommendations from your health care provider about what you eat and drink. Your health care provider may advise you to: ? Limit fast foods, sweets, and processed snack foods. ? Choose low-fat options, such as low-fat milk instead of whole milk. ? Eat 5 or more servings of fruits or vegetables every day. ? Eat at home more often. This gives you more control over what you eat. ? Choose healthy foods when you eat out. ? Learn what a healthy portion size is. ? Keep low-fat snacks on hand. ? Avoid sugary drinks, such as soda, fruit juice, iced tea sweetened with sugar, and flavored milk. ? Eat a healthy breakfast.  Drink enough water to keep your urine clear or pale yellow.  Do not go without eating for long periods of time (do not fast) or follow a fad diet. Fasting and fad diets can be unhealthy and even dangerous. Physical Activity  Exercise regularly, as told by your health care provider. Ask  your health care provider what types of exercise are safe for you and how often you should exercise.  Warm up and stretch before being active.  Cool down and stretch after being active.  Rest between periods of activity. Lifestyle  Limit the time that you spend in front of your TV, computer, or video game system.  Find ways to reward yourself that do not involve food.  Limit alcohol intake to no more than 1 drink a day for nonpregnant women and 2 drinks a day for men. One drink equals 12 oz of beer, 5 oz of wine, or 1 oz of hard liquor. General instructions  Keep a weight loss journal to keep track of the food you eat and how much you exercise you get.  Take over-the-counter and prescription medicines only as told  by your health care provider.  Take vitamins and supplements only as told by your health care provider.  Consider joining a support group. Your health care provider may be able to recommend a support group.  Keep all follow-up visits as told by your health care provider. This is important. Contact a health care provider if:  You are unable to meet your weight loss goal after 6 weeks of dietary and lifestyle changes. This information is not intended to replace advice given to you by your health care provider. Make sure you discuss any questions you have with your health care provider. Document Released: 02/03/2004 Document Revised: 05/31/2015 Document Reviewed: 10/14/2014 Elsevier Interactive Patient Education  2018 [place].  Preventing Unhealthy [employer], Adult Staying at a healthy weight is important. When fat builds up in your body, you may become overweight or obese. These conditions put you at greater risk for developing certain health problems, such as heart disease, diabetes, sleeping problems, joint problems, and some cancers. Unhealthy weight gain is often the result of making unhealthy choices in what you eat. It is also a result of not getting enough  exercise. You can make changes to your lifestyle to prevent obesity and stay as healthy as possible. What nutrition changes can be made? To maintain a healthy weight and prevent obesity:  Eat only as much as your body needs. To do this: ? Pay attention to signs that you are hungry or full. Stop eating as soon as you feel full. ? If you feel hungry, try drinking water first. Drink enough water so your urine is clear or pale yellow. ? Eat smaller portions. ? Look at serving sizes on food labels. Most foods contain more than one serving per container. ? Eat the recommended amount of calories for your gender and activity level. While most active people should eat around 2,000 calories per day, if you are trying to lose weight or are not very active, you main need to eat less calories. Talk to your health care provider or dietitian about how many calories you should eat each day.  Choose healthy foods, such as: ? Fruits and vegetables. Try to fill at least half of your plate at each meal with fruits and vegetables. ? Whole grains, such as whole wheat bread, brown rice, and quinoa. ? Lean meats, such as chicken or fish. ? Other healthy proteins, such as beans, eggs, or tofu. ? Healthy fats, such as nuts, seeds, fatty fish, and olive oil. ? Low-fat or fat-free dairy.  Check food labels and avoid food and drinks that: ? Are high in calories. ? Have added sugar. ? Are high in sodium. ? Have saturated fats or trans fats.  Limit how much you eat of the following foods: ? Prepackaged meals. ? Fast food. ? Fried foods. ? Processed meat, such as bacon, sausage, and deli meats. ? Fatty cuts of red meat and poultry with skin.  Cook foods in healthier ways, such as by baking, broiling, or grilling.  When grocery shopping, try to shop around the outside of the store. This helps you buy mostly fresh foods and avoid canned and prepackaged foods.  What lifestyle changes can be made?  Exercise at  least 30 minutes 5 or more days each week. Exercising includes brisk walking, yard work, biking, running, swimming, and team sports like basketball and soccer. Ask your health care provider which exercises are safe for you.  Do not use any products that contain nicotine or tobacco, such  as cigarettes and e-cigarettes. If you need help quitting, ask your health care provider.  Limit alcohol intake to no more than 1 drink a day for nonpregnant women and 2 drinks a day for men. One drink equals 12 oz of beer, 5 oz of wine, or 1 oz of hard liquor.  Try to get 7-9 hours of sleep each night. What other changes can be made?  Keep a food and activity journal to keep track of: ? What you ate and how many calories you had. Remember to count sauces, dressings, and side dishes. ? Whether you were active, and what exercises you did. ? Your calorie, weight, and activity goals.  Check your weight regularly. Track any changes. If you notice you have gained weight, make changes to your diet or activity routine.  Avoid taking weight-loss medicines or supplements. Talk to your health care provider before starting any new medicine or supplement.  Talk to your health care provider before trying any new diet or exercise plan. Why are these changes important? Eating healthy, staying active, and having healthy habits not only help prevent obesity, they also:  Help you to manage stress and emotions.  Help you to connect with friends and family.  Improve your self-esteem.  Improve your sleep.  Prevent long-term health problems.  What can happen if changes are not made? Being obese or overweight can cause you to develop joint or bone problems, which can make it hard for you to stay active or do activities you enjoy. Being obese or overweight also puts stress on your heart and lungs and can lead to health problems like diabetes, heart disease, and some cancers. Where to find more information: Talk with your  health care provider or a dietitian about healthy eating and healthy lifestyle choices. You may also find other information through these resources:  U.[place]. Department of Agriculture MyPlate: FormerBoss.no  American Heart Association: www.heart.org  Centers for Disease Control and Prevention: http://www.wolf.info/  Summary  Staying at a healthy weight is important. It helps prevent certain diseases and health problems, such as heart disease, diabetes, joint problems, sleep disorders, and some cancers.  Being obese or overweight can cause you to develop joint or bone problems, which can make it hard for you to stay active or do activities you enjoy.  You can prevent unhealthy weight gain by eating a healthy diet, exercising regularly, not smoking, limiting alcohol, and getting enough sleep.  Talk with your health care provider or a dietitian for guidance about healthy eating and healthy lifestyle choices. This information is not intended to replace advice given to you by your health care provider. Make sure you discuss any questions you have with your health care provider. Document Released: 12/28/2015 Document Revised: 02/02/2016 Document Reviewed: 02/02/2016 Elsevier Interactive Patient Education  [employer].

## 2017-07-13 NOTE — Progress Notes (Signed)
BP 126/74 (BP Location: Left Arm, Patient Position: Sitting, Cuff Size: Large)   Pulse 62   Temp 98.2 F (36.8 C) (Oral)   Resp 16   Ht 5\' 4"  (1.626 m)   Wt 254 lb 1.6 oz (115.3 kg)   LMP  (LMP Unknown)   SpO2 98%   BMI 43.62 kg/m    Subjective:    Patient ID: Elizabeth Russo, female    DOB: 1960-01-28, 57 y.o.   MRN: [id number]  HPI: Elizabeth Russo is a 57 y.o. female  Chief Complaint  Patient presents with  . Follow-up    patient is here for her 3 month f/u  . Hypertension    patient stated that she has not checked her BP since the last time she was here. no headaches, or blurred vision, but she has has some dizziness everyday.  . Diabetes    patient checks her blood sugar every other morning. averages 139-140. patient stated that she has been really tired.  . Hyperlipidemia    patient stated that she has been having some swelling in the left leg. takes meds as directed.  . Hypothyroidism    patient has had some issues with dry skin and fatigue.  . Obesity    patient has lost 8lbs. she stated that she has been a little more active than normal  . Labs Only    patient is fasting    HPI Patient is here for f/u  She has hypertension; not checking her BP; no headaches or blurred vision; daily dizziness  Type 2 diabetes mellitus; checking FSBS every other morning, averaging 139-140 range; urine microalb:Cr done in January, normal Lab Results  Component Value Date   HGBA1C 5.9 (H) 01/12/2017   Hyperlipidemia; she was prescribed Repatha but never started it, something about awaiting approval from pharmacy; on zetia; limiting fatty meats; had a hot dog yesterday, but that was July 4th Lab Results  Component Value Date   Russo 153 01/12/2017   HDL 41 (L) 01/12/2017   LDLCALC 97 01/12/2017   TRIG 65 01/12/2017   CHOLHDL 3.7 01/12/2017   Coronary artery disease and history of MI; no chest pain; cardiologist is Dr. Posey Pronto at [facility]; some swelling in the left leg more than  right; back down today; not swollen now; just when on feet for a long time or drinking extra water; taking aspirin  HTN; she felt dizzy and stopped taking her BP pills at work' had vertigo before; happening daily; taking the BP pills in the evenings, around 4 pm (the exforge); she is taking the coreg at 3-4 pm and the other  Hypothyroidism; feeling fatigued; some dry skin Lab Results  Component Value Date   TSH 0.93 01/12/2017   Obesity; she has trying to be more active than before; she has lost 9+ pounds  Gout; last uric acid was 6.2 in January  Stomach upset, acid burps; more fruit lately; taking pepto-bismol which is discoloring stool  Depression screen [facility] 2/9 07/13/2017 04/13/2017 01/12/2017 10/11/2016 09/20/2016  Decreased Interest 0 0 0 0 0  Down, Depressed, Hopeless 0 0 0 0 0  PHQ - 2 Score 0 0 0 0 0    Relevant past medical, surgical, family and social history reviewed Past Medical History:  Diagnosis Date  . CAD (coronary artery disease)   . Diabetes mellitus without complication ([place])   . Diverticulosis   . Hypertension   . Hypothyroid   . Morbid obesity ([place]) 06/26/2014  .  Myalgia due to statin 04/13/2017   Intolerant to three different statins  . Vertigo    Past Surgical History:  Procedure Laterality Date  . ABDOMINAL HYSTERECTOMY    . heart stent  2012   Family History  Problem Relation Age of Onset  . Heart disease Mother   . Diabetes Mother   . Hypertension Mother   . Hyperlipidemia Mother   . Thyroid disease Mother   . Cancer Father        colon  . Diabetes Father   . Hypertension Father   . Diabetes Sister   . Hypertension Sister   . Heart disease Sister   . Hyperlipidemia Sister   . Kidney disease Sister   . Cancer Maternal Grandfather        lung  . Heart attack Paternal Grandfather   . Heart disease Sister   . Diabetes Sister   . Heart disease Sister   . Arthritis Sister    Social History   Tobacco Use  . Smoking status: Former Smoker     Packs/day: 0.50    Years: 5.00    Pack years: 2.50    Types: Cigarettes    Start date: 01/09/2006    Last attempt to quit: 06/26/2011    Years since quitting: 6.0  . Smokeless tobacco: Never Used  Substance Use Topics  . Alcohol use: Yes    Alcohol/week: 0.0 oz    Comment: Socially   . Drug use: No    Interim medical history since last visit reviewed. Allergies and medications reviewed  Review of Systems Per HPI unless specifically indicated above     Objective:    BP 126/74 (BP Location: Left Arm, Patient Position: Sitting, Cuff Size: Large)   Pulse 62   Temp 98.2 F (36.8 C) (Oral)   Resp 16   Ht 5\' 4"  (1.626 m)   Wt 254 lb 1.6 oz (115.3 kg)   LMP  (LMP Unknown)   SpO2 98%   BMI 43.62 kg/m   Wt Readings from Last 3 Encounters:  07/13/17 254 lb 1.6 oz (115.3 kg)  04/13/17 261 lb 9.6 oz (118.7 kg)  03/23/17 263 lb 8 oz (119.5 kg)    Physical Exam  Constitutional: She appears well-developed and well-nourished. No distress.  HENT:  Head: Normocephalic and atraumatic.  Eyes: EOM are normal. No scleral icterus.  Neck: No thyromegaly present.  Cardiovascular: Normal rate, regular rhythm and normal heart sounds.  No murmur heard. Pulmonary/Chest: Effort normal and breath sounds normal. No respiratory distress. She has no wheezes.  Abdominal: Soft. Bowel sounds are normal. She exhibits no distension.  Musculoskeletal: Normal range of motion. She exhibits no edema.  Neurological: She is alert. She exhibits normal muscle tone.  Skin: Skin is warm and dry. She is not diaphoretic. No pallor.  Psychiatric: She has a normal mood and affect. Her behavior is normal. Judgment and thought content normal.   Diabetic Foot Form - Detailed   Diabetic Foot Exam - detailed Diabetic Foot exam was performed with the following findings:  Yes 07/13/2017  8:42 AM  Visual Foot Exam completed.:  Yes  Pulse Foot Exam completed.:  Yes  Right Dorsalis Pedis:  Present Left Dorsalis Pedis:   Present  Sensory Foot Exam Completed.:  Yes Semmes-Weinstein Monofilament Test R Site 1-Great Toe:  Pos L Site 1-Great Toe:  Pos        Results for orders placed or performed in visit on 01/12/17  Microalbumin / creatinine urine  ratio  Result Value Ref Range   Creatinine, Urine 60 20 - 275 mg/dL   Microalb, Ur <0.2 mg/dL   Microalb Creat Ratio NOTE <30 mcg/mg creat  Lipid panel  Result Value Ref Range   Cholesterol 153 <200 mg/dL   HDL 41 (L) >50 mg/dL   Triglycerides 65 <150 mg/dL   LDL Cholesterol (Calc) 97 mg/dL (calc)   Total Russo/HDL Ratio 3.7 <5.0 (calc)   Non-HDL Cholesterol (Calc) 112 <130 mg/dL (calc)  Hemoglobin A1c  Result Value Ref Range   Hgb A1c MFr Bld 5.9 (H) <5.7 % of total Hgb   Mean Plasma Glucose 123 (calc)   eAG (mmol/L) 6.8 (calc)  COMPLETE METABOLIC PANEL WITH GFR  Result Value Ref Range   Glucose, Bld 95 65 - 99 mg/dL   BUN 10 7 - 25 mg/dL   Creat 0.85 0.50 - 1.05 mg/dL   GFR, Est Non African American 77 > OR = 60 mL/min/1.73m2   GFR, Est African American 89 > OR = 60 mL/min/1.29m2   BUN/Creatinine Ratio NOT APPLICABLE 6 - 22 (calc)   Sodium 138 135 - 146 mmol/L   Potassium 4.2 3.5 - 5.3 mmol/L   Chloride 106 98 - 110 mmol/L   CO2 27 20 - 32 mmol/L   Calcium 9.0 8.6 - 10.4 mg/dL   Total Protein 7.3 6.1 - 8.1 g/dL   Albumin 3.9 3.6 - 5.1 g/dL   Globulin 3.4 1.9 - 3.7 g/dL (calc)   AG Ratio 1.1 1.0 - 2.5 (calc)   Total Bilirubin 0.4 0.2 - 1.2 mg/dL   Alkaline phosphatase (APISO) 76 33 - 130 U/L   AST 19 10 - 35 U/L   ALT 17 6 - 29 U/L  TSH  Result Value Ref Range   TSH 0.93 0.40 - 4.50 mIU/L  Uric acid  Result Value Ref Range   Uric Acid, Serum 6.2 2.5 - 7.0 mg/dL      Assessment & Plan:   Problem List Items Addressed This Visit      Cardiovascular and Mediastinum   Essential hypertension, benign (Chronic)    Encouraged weight loss and DASH guidelines      Relevant Medications   Evolocumab (REPATHA SURECLICK) 960 MG/ML SOAJ      Endocrine   Well controlled type 2 diabetes mellitus (HCC) (Chronic)    Foot exam by MD today; check A1c; encouraged weight loss      Relevant Orders   Lipid panel   Hemoglobin A1c     Other   Medication monitoring encounter - Primary   Relevant Orders   COMPLETE METABOLIC PANEL WITH GFR   Morbid obesity (HCC) (Chronic)    Weight down 9+ pounds over last 3+ months; encouragement given; see AVS; try to lose 1/2 to 1 pound per week over the next 6 months      Hyperlipidemia    Limit saturated fats; check lipids      Relevant Medications   Evolocumab (REPATHA SURECLICK) 454 MG/ML SOAJ   Other Relevant Orders   Lipid panel    Other Visit Diagnoses    Other fatigue       Relevant Orders   CBC with Differential/Platelet   TSH   Family history of colon cancer in father       Relevant Orders   Ambulatory referral to Gastroenterology   Hx of colonic polyp       Relevant Orders   Ambulatory referral to Gastroenterology  Follow up plan: No follow-ups on file.  An after-visit summary was printed and given to the patient at [place].  Please see the patient instructions which may contain other information and recommendations beyond what is mentioned above in the assessment and plan.  Meds ordered this encounter  Medications  . Evolocumab (REPATHA SURECLICK) 754 MG/ML SOAJ    Sig: Inject 1 each into the skin every 14 (fourteen) days.    Dispense:  2 pen    Refill:  5    Let me know if not approved    Orders Placed This Encounter  Procedures  . Lipid panel  . Hemoglobin A1c  . COMPLETE METABOLIC PANEL WITH GFR  . CBC with Differential/Platelet  . TSH  . Ambulatory referral to Gastroenterology

## 2017-07-13 NOTE — Assessment & Plan Note (Signed)
Weight down 9+ pounds over last 3+ months; encouragement given; see AVS; try to lose 1/2 to 1 pound per week over the next 6 months

## 2017-07-13 NOTE — Assessment & Plan Note (Signed)
Limit saturated fats; check lipids 

## 2017-07-13 NOTE — Assessment & Plan Note (Signed)
Foot exam by MD today; check A1c; encouraged weight loss

## 2017-07-14 LAB — COMPLETE METABOLIC PANEL WITH GFR
AG Ratio: 1.1 (calc) (ref 1.0–2.5)
ALKALINE PHOSPHATASE (APISO): 85 U/L (ref 33–130)
ALT: 16 U/L (ref 6–29)
AST: 18 U/L (ref 10–35)
Albumin: 3.9 g/dL (ref 3.6–5.1)
BILIRUBIN TOTAL: 0.3 mg/dL (ref 0.2–1.2)
BUN: 11 mg/dL (ref 7–25)
CHLORIDE: 105 mmol/L (ref 98–110)
CO2: 25 mmol/L (ref 20–32)
Calcium: 9.3 mg/dL (ref 8.6–10.4)
Creat: 0.79 mg/dL (ref 0.50–1.05)
GFR, Est African American: 97 mL/min/{1.73_m2} (ref >=60)
GFR, Est Non African American: 84 mL/min/{1.73_m2} (ref >=60)
GLUCOSE: 100 mg/dL — AB (ref 65–99)
Globulin: 3.6 g/dL (calc) (ref 1.9–3.7)
POTASSIUM: 3.8 mmol/L (ref 3.5–5.3)
Sodium: 138 mmol/L (ref 135–146)
Total Protein: 7.5 g/dL (ref 6.1–8.1)

## 2017-07-14 LAB — CBC WITH DIFFERENTIAL/PLATELET
Basophils Absolute: 50 cells/uL (ref 0–200)
Basophils Relative: 0.9 %
EOS PCT: 3.6 %
Eosinophils Absolute: 198 cells/uL (ref 15–500)
HEMATOCRIT: 40.4 % (ref 35.0–45.0)
HEMOGLOBIN: 13.7 g/dL (ref 11.7–15.5)
LYMPHS ABS: 2211 {cells}/uL (ref 850–3900)
MCH: 28.8 pg (ref 27.0–33.0)
MCHC: 33.9 g/dL (ref 32.0–36.0)
MCV: 85.1 fL (ref 80.0–100.0)
MONOS PCT: 10 %
MPV: 9 fL (ref 7.5–12.5)
NEUTROS ABS: 2492 {cells}/uL (ref 1500–7800)
NEUTROS PCT: 45.3 %
Platelets: 222 10*3/uL (ref 140–400)
RBC: 4.75 10*6/uL (ref 3.80–5.10)
RDW: 13.2 % (ref 11.0–15.0)
Total Lymphocyte: 40.2 %
WBC mixed population: 550 cells/uL (ref 200–950)
WBC: 5.5 10*3/uL (ref 3.8–10.8)

## 2017-07-14 LAB — LIPID PANEL
CHOL/HDL RATIO: 4.1 (calc) (ref <5.0)
Cholesterol: 159 mg/dL (ref <200)
HDL: 39 mg/dL — ABNORMAL LOW (ref >50)
LDL CHOLESTEROL (CALC): 100 mg/dL — AB
NON-HDL CHOLESTEROL (CALC): 120 mg/dL (ref <130)
TRIGLYCERIDES: 102 mg/dL (ref <150)

## 2017-07-14 LAB — TSH: TSH: 1.45 m[IU]/L (ref 0.40–4.50)

## 2017-07-14 LAB — HEMOGLOBIN A1C
EAG (MMOL/L): 6.8 (calc)
Hgb A1c MFr Bld: 5.9 % of total Hgb — ABNORMAL HIGH (ref <5.7)
Mean Plasma Glucose: 123 (calc)

## 2017-07-17 ENCOUNTER — Other Ambulatory Visit: Payer: Self-pay

## 2017-07-17 NOTE — Progress Notes (Signed)
erro  neous encounter

## 2017-07-18 ENCOUNTER — Telehealth: Payer: Self-pay | Admitting: Family Medicine

## 2017-07-18 NOTE — Telephone Encounter (Signed)
Copied from [place] [id number]. Topic: Quick Communication - See Telephone Encounter >> Jul 18, 2017 11:29 AM Rutherford Nail, NT wrote: CRM for notification. See Telephone encounter for: 07/18/17. John with [employer] calling to speak to someone over prior authorizations for Dr Sanda Klein. Regarding the Evolocumab [facility] SURECLICK) 206 MG/ML SOAJ CB#: [phone] Reference key: [facility]

## 2017-07-20 ENCOUNTER — Other Ambulatory Visit: Payer: Self-pay

## 2017-07-20 DIAGNOSIS — Z86010 Personal history of colonic polyps: Secondary | ICD-10-CM

## 2017-07-20 DIAGNOSIS — Z800 Family history of malignant neoplasm of digestive organs: Secondary | ICD-10-CM

## 2017-07-23 ENCOUNTER — Other Ambulatory Visit: Payer: Self-pay

## 2017-07-26 ENCOUNTER — Telehealth: Payer: Self-pay | Admitting: Family Medicine

## 2017-07-26 NOTE — Telephone Encounter (Signed)
Pt.notified

## 2017-07-26 NOTE — Telephone Encounter (Signed)
Copied from [place] [id number]. Topic: General - Other >> Jul 26, 2017  2:51 PM Lennox Solders wrote: Reason for CRM: pt is calling and would like to know if she should take repatha and her chole medication or should she stop chole medication

## 2017-07-26 NOTE — Telephone Encounter (Signed)
Keep taking the oral cholesterol medicine The Repatha is added to it [place] question

## 2017-08-13 ENCOUNTER — Encounter
Admission: RE | Disposition: A | Discharge status: Home / Self Care | Payer: Self-pay | Source: Ambulatory Visit | Attending: Gastroenterology

## 2017-08-13 ENCOUNTER — Ambulatory Visit
Admission: RE | Admit: 2017-08-13 | Discharge: 2017-08-13 | Disposition: A | Discharge status: Home / Self Care | Payer: BC Managed Care – PPO | Source: Ambulatory Visit | Attending: Gastroenterology | Admitting: Gastroenterology

## 2017-08-13 ENCOUNTER — Ambulatory Visit: Payer: BC Managed Care – PPO | Admitting: Anesthesiology

## 2017-08-13 DIAGNOSIS — E039 Hypothyroidism, unspecified: Secondary | ICD-10-CM | POA: Diagnosis not present

## 2017-08-13 DIAGNOSIS — Z79899 Other long term (current) drug therapy: Secondary | ICD-10-CM | POA: Diagnosis not present

## 2017-08-13 DIAGNOSIS — Z800 Family history of malignant neoplasm of digestive organs: Secondary | ICD-10-CM

## 2017-08-13 DIAGNOSIS — Z6841 Body Mass Index (BMI) 40.0 and over, adult: Secondary | ICD-10-CM | POA: Diagnosis not present

## 2017-08-13 DIAGNOSIS — Z87891 Personal history of nicotine dependence: Secondary | ICD-10-CM | POA: Insufficient documentation

## 2017-08-13 DIAGNOSIS — Z1211 Encounter for screening for malignant neoplasm of colon: Secondary | ICD-10-CM | POA: Diagnosis not present

## 2017-08-13 DIAGNOSIS — K6389 Other specified diseases of intestine: Secondary | ICD-10-CM | POA: Insufficient documentation

## 2017-08-13 DIAGNOSIS — I10 Essential (primary) hypertension: Secondary | ICD-10-CM | POA: Diagnosis not present

## 2017-08-13 DIAGNOSIS — I2510 Atherosclerotic heart disease of native coronary artery without angina pectoris: Secondary | ICD-10-CM | POA: Diagnosis not present

## 2017-08-13 DIAGNOSIS — Z794 Long term (current) use of insulin: Secondary | ICD-10-CM | POA: Diagnosis not present

## 2017-08-13 DIAGNOSIS — Z860100 Personal history of colon polyps, unspecified: Secondary | ICD-10-CM

## 2017-08-13 DIAGNOSIS — K5730 Diverticulosis of large intestine without perforation or abscess without bleeding: Secondary | ICD-10-CM | POA: Diagnosis not present

## 2017-08-13 DIAGNOSIS — E119 Type 2 diabetes mellitus without complications: Secondary | ICD-10-CM | POA: Insufficient documentation

## 2017-08-13 DIAGNOSIS — Z7982 Long term (current) use of aspirin: Secondary | ICD-10-CM | POA: Insufficient documentation

## 2017-08-13 DIAGNOSIS — K529 Noninfective gastroenteritis and colitis, unspecified: Secondary | ICD-10-CM | POA: Diagnosis not present

## 2017-08-13 DIAGNOSIS — D124 Benign neoplasm of descending colon: Secondary | ICD-10-CM | POA: Diagnosis not present

## 2017-08-13 HISTORY — PX: COLONOSCOPY WITH PROPOFOL: SHX5780

## 2017-08-13 LAB — GLUCOSE, CAPILLARY: GLUCOSE-CAPILLARY: 83 mg/dL (ref 70–99)

## 2017-08-13 SURGERY — COLONOSCOPY WITH PROPOFOL
Anesthesia: General

## 2017-08-13 MED ORDER — SODIUM CHLORIDE 0.9 % IV SOLN
INTRAVENOUS | Status: DC
  Administered 2017-08-13: 1000 mL via INTRAVENOUS

## 2017-08-13 MED ORDER — PROPOFOL 10 MG/ML IV BOLUS
INTRAVENOUS | Status: DC | PRN
  Administered 2017-08-13: 70 mg via INTRAVENOUS

## 2017-08-13 MED ORDER — PROPOFOL 500 MG/50ML IV EMUL
INTRAVENOUS | Status: DC | PRN
  Administered 2017-08-13: 140 ug/kg/min via INTRAVENOUS

## 2017-08-13 MED ORDER — LIDOCAINE HCL (CARDIAC) PF 100 MG/5ML IV SOSY
PREFILLED_SYRINGE | INTRAVENOUS | Status: DC | PRN
  Administered 2017-08-13: 40 mg via INTRAVENOUS

## 2017-08-13 NOTE — Anesthesia Preprocedure Evaluation (Signed)
Anesthesia Evaluation  Patient identified by MRN, date of birth, ID band Patient awake    Reviewed: Allergy & Precautions, H&P , NPO status , Patient's Chart, lab work & pertinent test results, reviewed documented beta blocker date and time   Airway Mallampati: II   Neck ROM: full    Dental  (+) Poor Dentition, Teeth Intact   Pulmonary neg pulmonary ROS, shortness of breath and with exertion, former smoker,    Pulmonary exam normal        Cardiovascular Exercise Tolerance: Poor hypertension, On Medications + CAD  negative cardio ROS Normal cardiovascular exam Rhythm:regular Rate:Normal     Neuro/Psych negative neurological ROS  negative psych ROS   GI/Hepatic negative GI ROS, Neg liver ROS,   Endo/Other  negative endocrine ROSdiabetesHypothyroidism Morbid obesity  Renal/GU negative Renal ROS  negative genitourinary   Musculoskeletal   Abdominal   Peds  Hematology negative hematology ROS (+)   Anesthesia Other Findings Past Medical History: No date: CAD (coronary artery disease) No date: Diabetes mellitus without complication ([place]) No date: Diverticulosis No date: Hypertension No date: Hypothyroid 06/26/2014: Morbid obesity ([place]) 04/13/2017: Myalgia due to statin     Comment:  Intolerant to three different statins No date: Vertigo Past Surgical History: No date: ABDOMINAL HYSTERECTOMY 2012: heart stent   Reproductive/Obstetrics negative OB ROS                             Anesthesia Physical Anesthesia Plan  ASA: III  Anesthesia Plan: General   Post-op Pain Management:    Induction:   PONV Risk Score and Plan:   Airway Management Planned:   Additional Equipment:   Intra-op Plan:   Post-operative Plan:   Informed Consent: I have reviewed the patients History and Physical, chart, labs and discussed the procedure including the risks, benefits and alternatives for the  proposed anesthesia with the patient or authorized representative who has indicated his/her understanding and acceptance.   Dental Advisory Given  Plan Discussed with: CRNA  Anesthesia Plan Comments:         Anesthesia Quick Evaluation

## 2017-08-13 NOTE — Anesthesia Post-op Follow-up Note (Signed)
Anesthesia QCDR form completed.        

## 2017-08-13 NOTE — Op Note (Signed)
[facility] Gastroenterology Patient Name: Elizabeth Russo Procedure Date: 08/13/2017 10:32 AM MRN: [id number] Account #: [id number] Date of Birth: 1960/12/18 Admit Type: Outpatient Age: 57 Room: [facility] ENDO ROOM 2 Gender: Female Note Status: Finalized Procedure:            Colonoscopy Indications:          High risk colon cancer surveillance: Personal history                        of colonic polyps, Family history of colon cancer in a                        first-degree relative, Last colonoscopy: December 2014 Providers:            Lin Landsman MD, MD Referring MD:         Arnetha Courser (Referring MD) Medicines:            Monitored Anesthesia Care Complications:        No immediate complications. Estimated blood loss:                        Minimal. Procedure:            Pre-Anesthesia Assessment:                       - Prior to the procedure, a History and Physical was                        performed, and patient medications and allergies were                        reviewed. The patient is competent. The risks and                        benefits of the procedure and the sedation options and                        risks were discussed with the patient. All questions                        were answered and informed consent was obtained.                        Patient identification and proposed procedure were                        verified by the physician, the nurse, the                        anesthesiologist, the anesthetist and the technician in                        the pre-procedure area in the procedure room in the                        endoscopy suite. Mental Status Examination: alert and                        oriented. Airway Examination: normal oropharyngeal  airway and neck mobility. Respiratory Examination:                        clear to auscultation. CV Examination: normal.                        Prophylactic  Antibiotics: The patient does not require                        prophylactic antibiotics. Prior Anticoagulants: The                        patient has taken no previous anticoagulant or                        antiplatelet agents. ASA Grade Assessment: III - A                        patient with severe systemic disease. After reviewing                        the risks and benefits, the patient was deemed in                        satisfactory condition to undergo the procedure. The                        anesthesia plan was to use monitored anesthesia care                        (MAC). Immediately prior to administration of                        medications, the patient was re-assessed for adequacy                        to receive sedatives. The heart rate, respiratory rate,                        oxygen saturations, blood pressure, adequacy of                        pulmonary ventilation, and response to care were                        monitored throughout the procedure. The physical status                        of the patient was re-assessed after the procedure.                       After obtaining informed consent, the colonoscope was                        passed under direct vision. Throughout the procedure,                        the patient's blood pressure, pulse, and oxygen  saturations were monitored continuously. The                        Colonoscope was introduced through the anus and                        advanced to the the cecum, identified by appendiceal                        orifice and ileocecal valve. The colonoscopy was                        performed without difficulty. The patient tolerated the                        procedure well. The quality of the bowel preparation                        was evaluated using the BBPS [facility] Bowel Preparation                        Scale) with scores of: Right Colon = 3 (entire mucosa                         seen well with no residual staining, small fragments of                        stool or opaque liquid), Transverse Colon = 3 (entire                        mucosa seen well with no residual staining, small                        fragments of stool or opaque liquid) and Left Colon = 2                        (minor amount of residual staining, small fragments of                        stool and/or opaque liquid, but mucosa seen well). The                        total BBPS score equals 8. The quality of the bowel                        preparation was good. Findings:      The perianal and digital rectal examinations were normal. Pertinent       negatives include normal sphincter tone and no palpable rectal lesions.      A 5 mm polyp was found in the ascending colon. The polyp was sessile.       The polyp was removed with a cold snare. Resection and retrieval were       complete.      Two sessile polyps were found in the descending colon. The polyps were 2       to 5 mm in size. These polyps were removed with a cold snare. Resection       and retrieval were complete. To prevent bleeding after  the polypectomy,       two hemostatic clips were successfully placed (MR conditional). There       was no bleeding at the end of the procedure.      Scattered small and large-mouthed diverticula were found in the entire       colon. There was no evidence of diverticular bleeding.      The retroflexed view of the distal rectum and anal verge was normal and       showed no anal or rectal abnormalities. Impression:           - One 5 mm polyp in the ascending colon, removed with a                        cold snare. Resected and retrieved.                       - Two 2 to 5 mm polyps in the descending colon, removed                        with a cold snare. Resected and retrieved. Clips (MR                        conditional) were placed.                       - Severe diverticulosis in the entire  examined colon.                        There was no evidence of diverticular bleeding.                       - The distal rectum and anal verge are normal on                        retroflexion view. Recommendation:       - Discharge patient to home (with escort).                       - Low fat diet and low sodium diet today.                       - Continue present medications.                       - Await pathology results.                       - Repeat colonoscopy in 3 - 5 years for surveillance of                        multiple polyps. Procedure Code(s):    --- Professional ---                       [phone], Colonoscopy, flexible; with removal of tumor(s),                        polyp(s), or other lesion(s) by snare technique Diagnosis Code(s):    --- Professional ---  Z86.010, Personal history of colonic polyps                       D12.2, Benign neoplasm of ascending colon                       D12.4, Benign neoplasm of descending colon                       Z80.0, Family history of malignant neoplasm of                        digestive organs                       K57.30, Diverticulosis of large intestine without                        perforation or abscess without bleeding CPT copyright 2017 American Medical Association. All rights reserved. The codes documented in this report are preliminary and upon coder review may  be revised to meet current compliance requirements. Dr. Ulyess Mort Lin Landsman MD, MD 08/13/2017 11:08:38 AM This report has been signed electronically. Number of Addenda: 0 Note Initiated On: 08/13/2017 10:32 AM Scope Withdrawal Time: 0 hours 17 minutes 55 seconds  Total Procedure Duration: 0 hours 21 minutes 39 seconds       [facility]

## 2017-08-13 NOTE — Transfer of Care (Signed)
Immediate Anesthesia Transfer of Care Note  Patient: Elizabeth Russo  Procedure(s) Performed: COLONOSCOPY WITH PROPOFOL (N/A )  Patient Location: PACU  Anesthesia Type:General  Level of Consciousness: sedated  Airway & Oxygen Therapy: Patient Spontanous Breathing and Patient connected to nasal cannula oxygen  Post-op Assessment: Report given to RN and Post -op Vital signs reviewed and stable  Post vital signs: Reviewed and stable  Last Vitals:   Vitals Value Taken Time  BP    Temp 36.1 C 08/13/2017 11:09 AM  Pulse    Resp    SpO2      Last Pain:  Vitals:   08/13/17 0953  TempSrc: Tympanic  PainSc: 0-No pain         Complications: No apparent anesthesia complications

## 2017-08-13 NOTE — H&P (Signed)
Cephas Darby, MD [address]  [place]  [place], [place] [address]  Main: [phone]  Fax: [phone] Pager: [phone]  Primary Care Physician:  Arnetha Courser, MD Primary Gastroenterologist:  Dr. Cephas Darby  Pre-Procedure History & Physical: HPI:  Elizabeth Russo is a 57 y.o. female is here for an colonoscopy.   Past Medical History:  Diagnosis Date  . CAD (coronary artery disease)   . Diabetes mellitus without complication ([place])   . Diverticulosis   . Hypertension   . Hypothyroid   . Morbid obesity ([place]) 06/26/2014  . Myalgia due to statin 04/13/2017   Intolerant to three different statins  . Vertigo     Past Surgical History:  Procedure Laterality Date  . ABDOMINAL HYSTERECTOMY    . heart stent  2012    Prior to Admission medications   Medication Sig Start Date End Date Taking? Authorizing Provider  amLODipine-valsartan (EXFORGE) 10-160 MG tablet TAKE 1 TABLET BY MOUTH ONCE DAILY 04/09/17   Arnetha Courser, MD  aspirin EC 81 MG tablet Take 81 mg by mouth daily.     [provider]  carvedilol (COREG) 25 MG tablet TAKE 1 TABLET BY MOUTH TWICE DAILY WITH A MEAL 08/21/16   Lada, Satira Anis, MD  diclofenac sodium (VOLTAREN) 1 % GEL Apply topically. 06/09/15   [provider]  Evolocumab (REPATHA SURECLICK) 456 MG/ML SOAJ Inject 1 each into the skin every 14 (fourteen) days. 07/13/17   Lada, Satira Anis, MD  ezetimibe (ZETIA) 10 MG tablet TAKE 1 TABLET BY MOUTH ONCE DAILY 03/07/17   Lada, Satira Anis, MD  fluticasone (FLONASE) 50 MCG/ACT nasal spray Place 2 sprays into both nostrils daily. Patient taking differently: Place 2 sprays into both nostrils daily as needed.  03/23/17   Hubbard Hartshorn, FNP  Insulin Pen Needle (BD PEN NEEDLE NANO U/F) 32G X 4 MM MISC Use for insulin administration four times daily. 01/27/15   Lada, Satira Anis, MD  levothyroxine (SYNTHROID, LEVOTHROID) 137 MCG tablet TAKE 1 TABLET BY MOUTH ONCE DAILY BEFORE BREAKFAST, ON  TUESDAYS, THURSDAYS, SATURDAYS & SUNDAYS ONLY 03/07/17   Lada, Satira Anis, MD  levothyroxine (SYNTHROID, LEVOTHROID) 150 MCG tablet TAKE 1 TABLET BY MOUTH ONCE DAILY ON MONDAY, WEDNESDAY AND FRIDAY AND TAKE 137 MCG ON THE OTHER DAYS 03/07/17   Lada, Satira Anis, MD  liraglutide (VICTOZA) 18 MG/3ML SOPN Inject 0.3 mLs (1.8 mg total) into the skin daily. 05/15/17   Arnetha Courser, MD  loratadine (CLARITIN) 10 MG tablet Take 1 tablet (10 mg total) by mouth daily. Patient not taking: Reported on 07/13/2017 03/23/17   Hubbard Hartshorn, FNP  metFORMIN (GLUCOPHAGE-XR) 500 MG 24 hr tablet TAKE 1 TABLET BY MOUTH ONCE DAILY WITH BREAKFAST 02/01/17   Arnetha Courser, MD  ONE TOUCH ULTRA TEST test strip Check sugar 3 times daily 01/17/17   Lada, Satira Anis, MD  ranitidine (ZANTAC) 150 MG tablet Take 1 tablet (150 mg total) by mouth 2 (two) times daily as needed for heartburn. 04/13/17   Arnetha Courser, MD    Allergies as of 07/23/2017 - Review Complete 07/13/2017  Allergen Reaction Noted  . Atorvastatin Other (See Comments) 03/31/2011  . Hydralazine Other (See Comments) 02/02/2015  . Hydrochlorothiazide Other (See Comments) 01/25/2016  . Lisinopril Cough 01/25/2016  . Pravastatin Other (See Comments) 02/02/2015  . Rosuvastatin Other (See Comments) 02/02/2015    Family History  Problem Relation Age of Onset  . Heart disease Mother   .  Diabetes Mother   . Hypertension Mother   . Hyperlipidemia Mother   . Thyroid disease Mother   . Cancer Father        colon  . Diabetes Father   . Hypertension Father   . Diabetes Sister   . Hypertension Sister   . Heart disease Sister   . Hyperlipidemia Sister   . Kidney disease Sister   . Cancer Maternal Grandfather        lung  . Heart attack Paternal Grandfather   . Heart disease Sister   . Diabetes Sister   . Heart disease Sister   . Arthritis Sister     Social History   Socioeconomic History  . Marital status: Widowed    Spouse name: Not on file  . Number of  children: 2  . Years of education: Not on file  . Highest education level: High school graduate  Occupational History  . Not on file  Social Needs  . Financial resource strain: Somewhat hard  . Food insecurity:    Worry: Never true    Inability: Never true  . Transportation needs:    Medical: No    Non-medical: No  Tobacco Use  . Smoking status: Former Smoker    Packs/day: 0.50    Years: 5.00    Pack years: 2.50    Types: Cigarettes    Start date: 01/09/2006    Last attempt to quit: 06/26/2011    Years since quitting: 6.1  . Smokeless tobacco: Never Used  Substance and Sexual Activity  . Alcohol use: Yes    Alcohol/week: 0.0 oz    Comment: Socially   . Drug use: No  . Sexual activity: Yes    Birth control/protection: Post-menopausal  Lifestyle  . Physical activity:    Days per week: 6 days    Minutes per session: 30 min  . Stress: Not at all  Relationships  . Social connections:    Talks on phone: More than three times a week    Gets together: More than three times a week    Attends religious service: More than 4 times per year    Active member of club or organization: No    Attends meetings of clubs or organizations: Never    Relationship status: Widowed  . Intimate partner violence:    Fear of current or ex partner: No    Emotionally abused: No    Physically abused: No    Forced sexual activity: No  Other Topics Concern  . Not on file  Social History Narrative  . Not on file    Review of Systems: See HPI, otherwise negative ROS  Physical Exam: BP 92/67   Pulse 63   Temp (!) 97 F (36.1 C)   Resp (!) 22   Ht 5\' 4"  (1.626 m)   Wt 250 lb (113.4 kg)   LMP  (LMP Unknown)   SpO2 95%   BMI 42.91 kg/m  General:   Alert,  pleasant and cooperative in NAD Head:  Normocephalic and atraumatic. Neck:  Supple; no masses or thyromegaly. Lungs:  Clear throughout to auscultation.    Heart:  Regular rate and rhythm. Abdomen:  Soft, nontender and nondistended.  Normal bowel sounds, without guarding, and without rebound.   Neurologic:  Alert and  oriented x4;  grossly normal neurologically.  Impression/Plan: Elizabeth Russo is here for an colonoscopy to be performed for colon cancer screening  Risks, benefits, limitations, and alternatives regarding  colonoscopy have  been reviewed with the patient.  Questions have been answered.  All parties agreeable.   Sherri Sear, MD  08/13/2017, 11:45 AM

## 2017-08-13 NOTE — Anesthesia Postprocedure Evaluation (Signed)
Anesthesia Post Note  Patient: Elizabeth Russo  Procedure(s) Performed: COLONOSCOPY WITH PROPOFOL (N/A )  Patient location during evaluation: PACU Anesthesia Type: General Level of consciousness: awake and alert Pain management: pain level controlled Vital Signs Assessment: post-procedure vital signs reviewed and stable Respiratory status: spontaneous breathing, nonlabored ventilation, respiratory function stable and patient connected to nasal cannula oxygen Cardiovascular status: blood pressure returned to baseline and stable Postop Assessment: no apparent nausea or vomiting Anesthetic complications: no     Last Vitals:  Vitals:   08/13/17 0953 08/13/17 1109  BP: 139/84 92/67  Pulse: (!) 52 63  Resp: 16 (!) 22  Temp: 36.4 C (!) 36.1 C  SpO2: 100% 95%    Last Pain:  Vitals:   08/13/17 0953  TempSrc: Tympanic  PainSc: 0-No pain                 Molli Barrows

## 2017-08-14 ENCOUNTER — Encounter: Payer: Self-pay | Admitting: Gastroenterology

## 2017-08-16 LAB — SURGICAL PATHOLOGY

## 2017-08-17 ENCOUNTER — Encounter: Payer: Self-pay | Admitting: Gastroenterology

## 2017-09-04 ENCOUNTER — Encounter: Payer: Self-pay | Admitting: Family Medicine

## 2017-09-11 ENCOUNTER — Telehealth: Payer: Self-pay

## 2017-09-11 NOTE — Telephone Encounter (Signed)
Last 7/5 Next 10/11

## 2017-09-13 ENCOUNTER — Other Ambulatory Visit: Payer: Self-pay

## 2017-09-13 ENCOUNTER — Telehealth: Payer: Self-pay | Admitting: Family Medicine

## 2017-09-13 MED ORDER — METFORMIN HCL ER 500 MG PO TB24: ORAL_TABLET | ORAL | 1 refills | Status: DC

## 2017-09-13 NOTE — Telephone Encounter (Signed)
Copied from [place] [id number]. Topic: Quick Communication - Rx Refill/Question >> Sep 13, 2017  8:21 AM Oliver Pila B wrote: Medication: metFORMIN (GLUCOPHAGE-XR) 500 MG 24 hr tablet [[id number]]   Has the patient contacted their pharmacy? Yes.   (Agent: If no, request that the patient contact the pharmacy for the refill.) (Agent: If yes, when and what did the pharmacy advise?)  Preferred Pharmacy (with phone number or street name): [facility] pharmacy  Agent: Please be advised that RX refills may take up to 3 business days. We ask that you follow-up with your pharmacy.

## 2017-09-13 NOTE — Telephone Encounter (Signed)
Last OV: 07/13/17 Next OV: 10/19/17

## 2017-09-20 ENCOUNTER — Ambulatory Visit: Payer: BC Managed Care – PPO | Admitting: Nurse Practitioner

## 2017-09-20 ENCOUNTER — Encounter: Payer: Self-pay | Admitting: Nurse Practitioner

## 2017-09-20 VITALS — BP 130/84 | HR 60 | Temp 98.3°F | Resp 16 | Ht 64.0 in | Wt 254.5 lb

## 2017-09-20 DIAGNOSIS — H6982 Other specified disorders of Eustachian tube, left ear: Secondary | ICD-10-CM

## 2017-09-20 DIAGNOSIS — R42 Dizziness and giddiness: Secondary | ICD-10-CM | POA: Diagnosis not present

## 2017-09-20 DIAGNOSIS — I10 Essential (primary) hypertension: Secondary | ICD-10-CM

## 2017-09-20 DIAGNOSIS — H9313 Tinnitus, bilateral: Secondary | ICD-10-CM

## 2017-09-20 DIAGNOSIS — R0981 Nasal congestion: Secondary | ICD-10-CM | POA: Diagnosis not present

## 2017-09-20 MED ORDER — MECLIZINE HCL 25 MG PO TABS
25.0000 mg | ORAL_TABLET | Freq: Three times a day (TID) | ORAL | 0 refills | Status: DC | PRN
Start: 2017-09-20 — End: 2017-10-19

## 2017-09-20 MED ORDER — FLUTICASONE PROPIONATE 50 MCG/ACT NA SUSP: 2.0000 | Freq: Every day | NASAL | 2 refills | Status: DC

## 2017-09-20 NOTE — Progress Notes (Signed)
Name: Elizabeth Russo   MRN: [id number]    DOB: Aug 10, 1960   Date:09/20/2017       Progress Note  Subjective  Chief Complaint  Chief Complaint  Patient presents with  . Blood Pressure Check    she thinks her bp is ok.  . Dizziness    she is here because she has been having dizziness for well over a month. Comes and goes. Has had episodes of vertigo in the past.    HPI  Patient takes amlodipine 10mg , valsartan 160mg  daily and coreg 25mg  tab at night.   BP Readings from Last 3 Encounters:  09/20/17 130/84  08/13/17 118/84  07/13/17 126/74   Dizziness has been ongoing for months now. Thought if she cut down on bp medications it would improve had no dizziness for a few days. Then noticed increase in sinus congestion, and got up and started to have dizziness again. States when she gets up quickly or turns head from one side to other too quickly makes dizziness worse. States has 3-4 episodes of dizziness a day now, these episodes last for about a minute. Endorses blurry vision sometimes with episodes, and has static sound in bilateral ears. Endorses nausea after dizziness.   Denies slurred speech, palpitations, headaches, weakness, chest pain.   Uses flonase as needed. Stopped taking daily Claritin states it dries her up too much.    Patient Active Problem List   Diagnosis Date Noted  . Family history of colon cancer in father   . Personal history of colonic polyps   . Myalgia due to statin 04/13/2017  . Venous stasis 04/13/2017  . Vision changes 10/11/2016  . Screening mammogram, encounter for 10/11/2016  . Shortness of breath 01/05/2016  . Vertigo 10/06/2015  . Medication monitoring encounter 04/26/2015  . History of non-ST elevation myocardial infarction (NSTEMI) 02/05/2015  . Hx of percutaneous transluminal coronary angioplasty 02/05/2015  . Gout 12/25/2014  . Gout of knee 12/25/2014  . Low back pain 12/25/2014  . Night sweats 10/23/2014  . Localized enlarged lymph nodes  10/23/2014  . Knee pain, bilateral 10/23/2014  . Joint pain 10/01/2014  . Morbid obesity ([place]) 06/26/2014  . Chronic combined systolic and diastolic heart failure ([place]) 05/24/2014  . Breast mass, right 04/25/2014  . Goiter 03/19/2014  . Coronary atherosclerosis 12/19/2013  . Hyperlipidemia 12/19/2013  . Essential hypertension, benign 12/18/2013  . Well controlled type 2 diabetes mellitus ([place]) 12/18/2013  . Hypothyroidism 12/18/2013  . Hearing loss 08/25/2011  . Atrial fibrillation ([place]) 02/13/2011    Past Medical History:  Diagnosis Date  . CAD (coronary artery disease)   . Diabetes mellitus without complication ([place])   . Diverticulosis   . Hypertension   . Hypothyroid   . Morbid obesity ([place]) 06/26/2014  . Myalgia due to statin 04/13/2017   Intolerant to three different statins  . Vertigo     Past Surgical History:  Procedure Laterality Date  . ABDOMINAL HYSTERECTOMY    . COLONOSCOPY WITH PROPOFOL N/A 08/13/2017   Procedure: COLONOSCOPY WITH PROPOFOL;  Surgeon: Lin Landsman, MD;  Location: [facility] ENDOSCOPY;  Service: Gastroenterology;  Laterality: N/A;  . heart stent  2012    Social History   Tobacco Use  . Smoking status: Former Smoker    Packs/day: 0.50    Years: 5.00    Pack years: 2.50    Types: Cigarettes    Start date: 01/09/2006    Last attempt to quit: 06/26/2011    Years since quitting:  6.2  . Smokeless tobacco: Never Used  Substance Use Topics  . Alcohol use: Yes    Alcohol/week: 0.0 standard drinks    Comment: Socially      Current Outpatient Medications:  .  amLODipine-valsartan (EXFORGE) 10-160 MG tablet, TAKE 1 TABLET BY MOUTH ONCE DAILY, Disp: 30 tablet, Rfl: 5 .  aspirin EC 81 MG tablet, Take 81 mg by mouth daily. , Disp: , Rfl:  .  carvedilol (COREG) 25 MG tablet, TAKE 1 TABLET BY MOUTH TWICE DAILY WITH A MEAL, Disp: 60 tablet, Rfl: 11 .  Evolocumab (REPATHA SURECLICK) 774 MG/ML SOAJ, Inject 1 each into the skin every 14 (fourteen) days.,  Disp: 2 pen, Rfl: 5 .  ezetimibe (ZETIA) 10 MG tablet, TAKE 1 TABLET BY MOUTH ONCE DAILY, Disp: 30 tablet, Rfl: 12 .  fluticasone (FLONASE) 50 MCG/ACT nasal spray, Place 2 sprays into both nostrils daily. (Patient taking differently: Place 2 sprays into both nostrils daily as needed. ), Disp: 16 g, Rfl: 2 .  Insulin Pen Needle (BD PEN NEEDLE NANO U/F) 32G X 4 MM MISC, Use for insulin administration four times daily., Disp: 200 each, Rfl: 1 .  levothyroxine (SYNTHROID, LEVOTHROID) 137 MCG tablet, TAKE 1 TABLET BY MOUTH ONCE DAILY BEFORE BREAKFAST, ON TUESDAYS, THURSDAYS, SATURDAYS & SUNDAYS ONLY, Disp: 17 tablet, Rfl: 11 .  levothyroxine (SYNTHROID, LEVOTHROID) 150 MCG tablet, TAKE 1 TABLET BY MOUTH ONCE DAILY ON MONDAY, WEDNESDAY AND FRIDAY AND TAKE 137 MCG ON THE OTHER DAYS, Disp: 13 tablet, Rfl: 11 .  liraglutide (VICTOZA) 18 MG/3ML SOPN, Inject 0.3 mLs (1.8 mg total) into the skin daily., Disp: 5 pen, Rfl: 11 .  metFORMIN (GLUCOPHAGE-XR) 500 MG 24 hr tablet, TAKE 1 TABLET BY MOUTH ONCE DAILY WITH BREAKFAST, Disp: 30 tablet, Rfl: 1 .  ONE TOUCH ULTRA TEST test strip, Check sugar 3 times daily, Disp: 100 each, Rfl: 5 .  ranitidine (ZANTAC) 150 MG tablet, Take 1 tablet (150 mg total) by mouth 2 (two) times daily as needed for heartburn., Disp: 60 tablet, Rfl: 2 .  diclofenac sodium (VOLTAREN) 1 % GEL, Apply topically., Disp: , Rfl:  .  loratadine (CLARITIN) 10 MG tablet, Take 1 tablet (10 mg total) by mouth daily. (Patient not taking: Reported on 09/20/2017), Disp: 30 tablet, Rfl: 2  Allergies  Allergen Reactions  . Atorvastatin Other (See Comments)    Other reaction(s): Muscle Pain  . Hydralazine Other (See Comments)    Aggravated gout Aggravated gout  . Hydrochlorothiazide Other (See Comments)    Aggravated gout  . Lisinopril Cough  . Pravastatin Other (See Comments)    Other reaction(s): Muscle Pain  . Rosuvastatin Other (See Comments)    Other reaction(s): Muscle Pain    Review of  Systems  Constitutional: Negative for chills.  HENT: Negative for ear discharge and nosebleeds.   Eyes: Negative for pain, discharge and redness.  Respiratory: Negative for cough and shortness of breath.   Cardiovascular: Negative for chest pain.  Gastrointestinal: Negative for abdominal pain, diarrhea and vomiting.  Musculoskeletal: Negative for falls.  Neurological: Negative for tingling, sensory change, speech change and focal weakness.  Psychiatric/Behavioral: Negative for depression. The patient does not have insomnia.     No other specific complaints in a complete review of systems (except as listed in HPI above).  Objective  Vitals:   09/20/17 0955  BP: 130/84  Pulse: 60  Resp: 16  Temp: 98.3 F (36.8 C)  TempSrc: Oral  SpO2: 98%  Weight: 254 lb  8 oz (115.4 kg)  Height: 5\' 4"  (1.626 m)     Body mass index is 43.68 kg/m.  Nursing Note and Vital Signs reviewed. Physical Exam  Constitutional: She appears well-developed and well-nourished. No distress.  HENT:  Head: Normocephalic and atraumatic.  Eyes: EOM are normal. No scleral icterus. no nystagmus, PERRLA Ears: Bilateral ear canals normal, hearing intact, non-injected TM,  Nose: mild mucosal edema noted, no sinus tenderness.  Neck: No thyromegaly present.  Cardiovascular: Normal rate, regular rhythm and normal heart sounds. No murmur heard. Pulmonary/Chest: Effort normal and breath sounds normal. No respiratory distress. She has no wheezes.  Musculoskeletal: Normal range of motion. She exhibits no edema.  Neurological: She is alert. She exhibits normal muscle tone. steady gait, no cranial nerve deficits noted,  Skin: Skin is warm and dry. She is not diaphoretic. No pallor.  Psychiatric: She has a normal mood and affect. Her behavior is normal. Judgment and thought content normal.     Assessment & Plan  1. Dizziness Discussed ER precautions, hydration.  - Ambulatory referral to ENT - meclizine (ANTIVERT)  25 MG tablet; Take 1 tablet (25 mg total) by mouth 3 (three) times daily as needed for dizziness.  Dispense: 30 tablet; Refill: 0  2. Tinnitus of both ears - Ambulatory referral to ENT  3. Eustachian tube dysfunction, left - Ambulatory referral to ENT - fluticasone (FLONASE) 50 MCG/ACT nasal spray; Place 2 sprays into both nostrils daily.  Dispense: 16 g; Refill: 2  4. Nasal congestion - nasal saline spray if too dry - fluticasone (FLONASE) 50 MCG/ACT nasal spray; Place 2 sprays into both nostrils daily.  Dispense: 16 g; Refill: 2  5. Essential hypertension, benign Continue medication regimen, low salt diet.

## 2017-09-20 NOTE — Patient Instructions (Signed)
-   Continue taking flonase daily if tolerated - Can use saline spray to moisten nose - Take meclizine as needed for dizziness - Follow up with ENT - If having headache, slurred speech, weakness or dizziness is not going away, or falling/off balance go to ER.

## 2017-10-09 ENCOUNTER — Other Ambulatory Visit: Payer: Self-pay | Admitting: Family Medicine

## 2017-10-09 NOTE — Telephone Encounter (Signed)
Pt has an appt on 10/19/17

## 2017-10-09 NOTE — Telephone Encounter (Signed)
Requested Prescriptions  Pending Prescriptions Disp Refills   carvedilol (COREG) 25 MG tablet 60 tablet 11     There is no refill protocol information for this order     levothyroxine (SYNTHROID, LEVOTHROID) 137 MCG tablet 17 tablet 11    Sig: TAKE 1 TABLET BY MOUTH ONCE DAILY BEFORE BREAKFAST, ON TUESDAYS, THURSDAYS, SATURDAYS & SUNDAYS ONLY     There is no refill protocol information for this order

## 2017-10-09 NOTE — Telephone Encounter (Signed)
Copied from [place] [id number]. Topic: Quick Communication - Rx Refill/Question >> Oct 09, 2017  1:55 PM Yvette Rack wrote: Medication: levothyroxine (SYNTHROID, LEVOTHROID) 137 MCG tablet  carvedilol (COREG) 25 MG tablet  Has the patient contacted their pharmacy? Yes.   (Agent: If no, request that the patient contact the pharmacy for the refill.) (Agent: If yes, when and what did the pharmacy advise?)call provider  Preferred Pharmacy (with phone number or street name):     [facility], [place] [phone] (Phone) [phone] (Fax)    Agent: Please be advised that RX refills may take up to 3 business days. We ask that you follow-up with your pharmacy.

## 2017-10-10 MED ORDER — AMLODIPINE BESYLATE-VALSARTAN 10-160 MG PO TABS: 1.0000 | ORAL_TABLET | Freq: Every day | ORAL | 5 refills | Status: DC

## 2017-10-10 MED ORDER — CARVEDILOL 25 MG PO TABS: ORAL_TABLET | ORAL | 5 refills | Status: DC

## 2017-10-19 ENCOUNTER — Ambulatory Visit: Payer: BC Managed Care – PPO | Admitting: Family Medicine

## 2017-10-19 ENCOUNTER — Telehealth: Payer: Self-pay

## 2017-10-19 ENCOUNTER — Encounter: Payer: Self-pay | Admitting: Family Medicine

## 2017-10-19 VITALS — BP 120/84 | HR 68 | Temp 98.4°F | Ht 64.0 in | Wt 253.7 lb

## 2017-10-19 DIAGNOSIS — E1169 Type 2 diabetes mellitus with other specified complication: Secondary | ICD-10-CM

## 2017-10-19 DIAGNOSIS — I252 Old myocardial infarction: Secondary | ICD-10-CM

## 2017-10-19 DIAGNOSIS — I2510 Atherosclerotic heart disease of native coronary artery without angina pectoris: Secondary | ICD-10-CM | POA: Diagnosis not present

## 2017-10-19 DIAGNOSIS — E039 Hypothyroidism, unspecified: Secondary | ICD-10-CM

## 2017-10-19 DIAGNOSIS — Z5181 Encounter for therapeutic drug level monitoring: Secondary | ICD-10-CM

## 2017-10-19 DIAGNOSIS — I5042 Chronic combined systolic (congestive) and diastolic (congestive) heart failure: Secondary | ICD-10-CM

## 2017-10-19 DIAGNOSIS — I10 Essential (primary) hypertension: Secondary | ICD-10-CM

## 2017-10-19 DIAGNOSIS — E669 Obesity, unspecified: Secondary | ICD-10-CM

## 2017-10-19 DIAGNOSIS — E782 Mixed hyperlipidemia: Secondary | ICD-10-CM

## 2017-10-19 DIAGNOSIS — I480 Paroxysmal atrial fibrillation: Secondary | ICD-10-CM

## 2017-10-19 DIAGNOSIS — R42 Dizziness and giddiness: Secondary | ICD-10-CM

## 2017-10-19 MED ORDER — LIRAGLUTIDE 18 MG/3ML ~~LOC~~ SOPN: 1.2000 mg | PEN_INJECTOR | Freq: Every day | SUBCUTANEOUS | 11 refills | Status: DC

## 2017-10-19 MED ORDER — FAMOTIDINE 20 MG PO TABS: 20.0000 mg | ORAL_TABLET | Freq: Two times a day (BID) | ORAL | 3 refills | Status: DC | PRN

## 2017-10-19 MED ORDER — MECLIZINE HCL 25 MG PO TABS: 25.0000 mg | ORAL_TABLET | Freq: Three times a day (TID) | ORAL | 0 refills | Status: DC | PRN

## 2017-10-19 NOTE — Progress Notes (Signed)
BP 120/84   Pulse 68   Temp 98.4 F (36.9 C)   Ht 5\' 4"  (1.626 m)   Wt 253 lb 11.2 oz (115.1 kg)   LMP  (LMP Unknown)   SpO2 97%   BMI 43.55 kg/m    Subjective:    Patient ID: Elizabeth Russo, female    DOB: [birth year], 57 y.o.   MRN: [id number]  HPI: Elizabeth Russo is a 57 y.o. female  Chief Complaint  Patient presents with  . Follow-up    HPI  Here for f/u Type 2 diabetes; thinks it is doing all right; checking FSBS every other night or so; lowest in the last two weeks 98, highest was 143; having some dry mouth  High blood pressure; controlled, chronic; taking medicine regularly; avoiding salt, no added salt  Morbid obesity; she does not know why she's not losing weight; she does not exercise every day; gets out and messes around in the yard; limiting portions; no idea how many calories she is using  GERD; stopped the ranitidine because of her acid reflux  Hypothyroidism; last thyroid check okay Lab Results  Component Value Date   TSH 1.45 07/13/2017   Coronary artery disease; she has not seen her heart doctor; no chest pain; taking aspirin; hx atrial fib, intermittent  High cholesterol; chronic; on repatha and zetia; she does not tolerate statins; trying to limit fatty foods  Lab Results  Component Value Date   Russo 159 07/13/2017   Russo 153 01/12/2017   Russo 155 10/11/2016   Lab Results  Component Value Date   HDL 39 (L) 07/13/2017   HDL 41 (L) 01/12/2017   HDL 43 (L) 10/11/2016   Lab Results  Component Value Date   LDLCALC 100 (H) 07/13/2017   LDLCALC 97 01/12/2017   LDLCALC 98 10/11/2016   Lab Results  Component Value Date   TRIG 102 07/13/2017   TRIG 65 01/12/2017   TRIG 57 10/11/2016   Lab Results  Component Value Date   CHOLHDL 4.1 07/13/2017   CHOLHDL 3.7 01/12/2017   CHOLHDL 3.6 10/11/2016   No results found for: LDLDIRECT   Depression screen [facility] 2/9 10/19/2017 09/20/2017 07/13/2017 04/13/2017 01/12/2017  Decreased Interest 0 0 0 0  0  Down, Depressed, Hopeless 0 0 0 0 0  PHQ - 2 Score 0 0 0 0 0  Altered sleeping 0 - - - -  Tired, decreased energy 2 - - - -  Change in appetite 0 - - - -  Feeling bad or failure about yourself  0 - - - -  Trouble concentrating 0 - - - -  Moving slowly or fidgety/restless 0 - - - -  Suicidal thoughts 0 - - - -  PHQ-9 Score 2 - - - -  Difficult doing work/chores Not difficult at all - - - -   Fall Risk  10/19/2017 09/20/2017 07/13/2017 04/13/2017 01/12/2017  Falls in the past year? No No No No No  Number falls in past yr: - - - - -  Injury with Fall? - - - - -    Relevant past medical, surgical, family and social history reviewed Past Medical History:  Diagnosis Date  . CAD (coronary artery disease)   . Diabetes mellitus without complication ([place])   . Diverticulosis   . Hypertension   . Hypothyroid   . Morbid obesity ([place]) 06/26/2014  . Myalgia due to statin 04/13/2017   Intolerant to three different statins  .  Vertigo    Past Surgical History:  Procedure Laterality Date  . ABDOMINAL HYSTERECTOMY    . COLONOSCOPY WITH PROPOFOL N/A 08/13/2017   Procedure: COLONOSCOPY WITH PROPOFOL;  Surgeon: Lin Landsman, MD;  Location: [facility] ENDOSCOPY;  Service: Gastroenterology;  Laterality: N/A;  . heart stent  2012   Family History  Problem Relation Age of Onset  . Heart disease Mother   . Diabetes Mother   . Hypertension Mother   . Hyperlipidemia Mother   . Thyroid disease Mother   . Cancer Father        colon  . Diabetes Father   . Hypertension Father   . Diabetes Sister   . Hypertension Sister   . Heart disease Sister   . Hyperlipidemia Sister   . Kidney disease Sister   . Cancer Maternal Grandfather        lung  . Heart attack Paternal Grandfather   . Heart disease Sister   . Diabetes Sister   . Heart disease Sister   . Arthritis Sister    Social History   Tobacco Use  . Smoking status: Former Smoker    Packs/day: 0.50    Years: 5.00    Pack years: 2.50     Types: Cigarettes    Start date: 01/09/2006    Last attempt to quit: 06/26/2011    Years since quitting: 6.3  . Smokeless tobacco: Never Used  Substance Use Topics  . Alcohol use: Yes    Alcohol/week: 0.0 standard drinks    Comment: Socially   . Drug use: No     Office Visit from 10/19/2017 in [facility]  AUDIT-C Score  2      Interim medical history since last visit reviewed. Allergies and medications reviewed  Review of Systems  Constitutional: Negative for fever and unexpected weight change.  Cardiovascular: Negative for chest pain.  Gastrointestinal: Negative for blood in stool.  Genitourinary: Negative for hematuria.   Per HPI unless specifically indicated above     Objective:    BP 120/84   Pulse 68   Temp 98.4 F (36.9 C)   Ht 5\' 4"  (1.626 m)   Wt 253 lb 11.2 oz (115.1 kg)   LMP  (LMP Unknown)   SpO2 97%   BMI 43.55 kg/m   Wt Readings from Last 3 Encounters:  10/19/17 253 lb 11.2 oz (115.1 kg)  09/20/17 254 lb 8 oz (115.4 kg)  08/13/17 250 lb (113.4 kg)    Physical Exam  Constitutional: She appears well-developed and well-nourished. No distress.  HENT:  Head: Normocephalic and atraumatic.  Eyes: EOM are normal. No scleral icterus.  Neck: No thyromegaly present.  Cardiovascular: Normal rate, regular rhythm and normal heart sounds.  No murmur heard. Pulmonary/Chest: Effort normal and breath sounds normal. No respiratory distress. She has no wheezes.  Abdominal: Soft. Bowel sounds are normal. She exhibits no distension.  Musculoskeletal: She exhibits no edema.  Neurological: She is alert.  Skin: Skin is warm and dry. She is not diaphoretic. No pallor.  Psychiatric: She has a normal mood and affect. Her behavior is normal. Judgment and thought content normal.   Diabetic Foot Form - Detailed   Diabetic Foot Exam - detailed Diabetic Foot exam was performed with the following findings:  Yes 10/19/2017  8:08 AM  Visual Foot Exam  completed.:  Yes  Pulse Foot Exam completed.:  Yes  Right Dorsalis Pedis:  Present Left Dorsalis Pedis:  Present  Sensory Foot Exam Completed.:  Yes Semmes-Weinstein Monofilament Test R Site 1-Great Toe:  Pos L Site 1-Great Toe:  Pos    Comments:  Calluses on both great toes and lateral balls of feet      Results for orders placed or performed during the hospital encounter of 08/13/17  Glucose, capillary  Result Value Ref Range   Glucose-Capillary 83 70 - 99 mg/dL   Comment 1 IN EPIC   Surgical pathology  Result Value Ref Range   SURGICAL PATHOLOGY      Surgical Pathology CASE: [id number] PATIENT: Silvestre Moment Surgical Pathology Report     SPECIMEN SUBMITTED: A. Colon polyp, ascending;cold snare B. Colon polyp x2, descending;cold snare  CLINICAL HISTORY: None provided  PRE-OPERATIVE DIAGNOSIS: Family history colon cancer, family history of colon polyps  POST-OPERATIVE DIAGNOSIS: Diverticulosis; colon polyps     DIAGNOSIS: A. COLON POLYP, ASCENDING; COLD SNARE: - COLONIC MUCOSA WITH PROMINENT LYMPHOID AGGREGATE. - FOCAL MILD ACTIVE COLITIS, NONSPECIFIC. - NEGATIVE FOR DYSPLASIA AND MALIGNANCY.  B.  COLON POLYP X2, DESCENDING; COLD SNARE: - TUBULAR ADENOMA (1). - COLONIC MUCOSA WITH PROMINENT LYMPHOID AGGREGATE MEASURING 4 MM (1). - MELANOSIS COLI. - NEGATIVE FOR HIGH-GRADE DYSPLASIA AND MALIGNANCY.  Comment: Given the prominent 60mm lymphoid aggregate in specimen B a limited panel of immunohistochemical stains was performed.  The lymphocytes in the aggregate demonstrate a mix of CD20 B cells wi th a comparable staining pattern of CD3 / CD5 T cells.  CD10 highlights cells in the germinal center.  Germinal centers are BCL-2 negative.  This pattern of immunoreactivity is consistent with benign reactive lymphoid aggregate.  IHC slides were prepared by [facility], [place], [place]. All controls stained appropriately.  This  test was developed and its performance characteristics determined by [facility]. It has not been cleared or approved by the [place] Food and Drug Administration. The FDA does not require this test to go through premarket FDA review. This test is used for clinical purposes. It should not be regarded as investigational or for research. This laboratory is certified under the Clinical Laboratory Improvement Amendments (CLIA) as qualified to perform high complexity clinical laboratory testing.    GROSS DESCRIPTION: A. Labeled: Cold snare polyp ascending colon Received: In formalin Tissue fragment(s): 4 Size: 0.1-0.5 cm De scription: Tan fragments Entirely submitted in one cassette.  B. Labeled: Cold snare polyp descending colon x2 Received: In formalin Tissue fragment(s): Multiple Size: Aggregate, 2.5 x 0.8 x 0.1 cm Description: Pink fragments of tissue and fecal material Entirely submitted in one cassette.   Final Diagnosis performed by Quay Burow, MD.   Electronically signed 08/16/2017 2:51:03PM The electronic signature indicates that the named Attending Pathologist has evaluated the specimen  Technical component performed at [facility], [address], [place], [place] [address] Lab: [phone] Dir: Rush Farmer, MD, MMM  Professional component performed at [facility], [facility] [place], [place],  [address] Lab: [phone] Dir: Dellia Nims. Rubinas, MD       Assessment & Plan:   Problem List Items Addressed This Visit      Cardiovascular and Mediastinum   Essential hypertension, benign (Chronic)    Controlled, continue to limit salt; work on weight loss      Coronary atherosclerosis - Primary (Chronic)    Encouraged pt to see cardiologist; continue aspirin      Relevant Orders   Lipid panel   Ambulatory referral to Cardiology   Chronic combined systolic and diastolic heart failure ([place])    Monitored by  cardiologist; encouraged patient to see  cardiologist; avoid salt      Relevant Orders   Ambulatory referral to Cardiology   Atrial fibrillation [facility])    Cardiologist has her on aspirin for this      Relevant Orders   Ambulatory referral to Cardiology     Endocrine   Hypothyroidism (Chronic)    Chronic, stable, controlled      Diabetes mellitus type 2 in obese ([place])    Foot exam by MD; check labs today; encouraged healthy eating and weight loss; eye exam UTD      Relevant Medications   liraglutide (VICTOZA) 18 MG/3ML SOPN   Other Relevant Orders   Lipid panel   Hemoglobin A1c     Other   History of non-ST elevation myocardial infarction (NSTEMI) (Chronic)   Relevant Orders   Ambulatory referral to Cardiology   Hyperlipidemia   Relevant Orders   Lipid panel   Morbid obesity ([place]) (Chronic)    encourged weight loss; work on 6 pounds of weight before next visit in 3 months; see AVS      Relevant Medications   liraglutide ([place]) 18 MG/3ML SOPN   Medication monitoring encounter    Liver and kidney function just checked in July       Other Visit Diagnoses    Dizziness       Relevant Medications   meclizine (ANTIVERT) 25 MG tablet       Follow up plan: No follow-ups on file.  An after-visit summary was printed and given to the patient at [place].  Please see the patient instructions which may contain other information and recommendations beyond what is mentioned above in the assessment and plan.  Meds ordered this encounter  Medications  . famotidine (PEPCID) 20 MG tablet    Sig: Take 1 tablet (20 mg total) by mouth 2 (two) times daily as needed for heartburn or indigestion.    Dispense:  180 tablet    Refill:  3  . meclizine (ANTIVERT) 25 MG tablet    Sig: Take 1 tablet (25 mg total) by mouth 3 (three) times daily as needed for dizziness.    Dispense:  30 tablet    Refill:  0  . liraglutide (VICTOZA) 18 MG/3ML SOPN    Sig: Inject 0.2 mLs (1.2 mg total) into the skin daily.    Dispense:  5  pen    Refill:  11    Orders Placed This Encounter  Procedures  . Lipid panel  . Hemoglobin A1c  . Ambulatory referral to Cardiology

## 2017-10-19 NOTE — Telephone Encounter (Signed)
That is correct She wanted to reduce the dose Thank them for double-checking

## 2017-10-19 NOTE — Assessment & Plan Note (Signed)
Liver and kidney function just checked in July

## 2017-10-19 NOTE — Assessment & Plan Note (Signed)
Encouraged pt to see cardiologist; continue aspirin

## 2017-10-19 NOTE — Assessment & Plan Note (Signed)
Cardiologist has her on aspirin for this

## 2017-10-19 NOTE — Assessment & Plan Note (Signed)
Controlled, continue to limit salt; work on weight loss

## 2017-10-19 NOTE — Assessment & Plan Note (Signed)
Monitored by cardiologist; encouraged patient to see cardiologist; avoid salt

## 2017-10-19 NOTE — Telephone Encounter (Signed)
Left detailed voicemail

## 2017-10-19 NOTE — Telephone Encounter (Signed)
Copied from [place] [id number]. Topic: General - Other >> Oct 19, 2017  8:44 AM Yvette Rack wrote: Reason for CRM:     [place], [place]   Have a question wants to know if the     liraglutide (VICTOZA)  Inject 0.2 mLs (1.2 mg total) into the skin daily. Is the correct order or do it need to be changed

## 2017-10-19 NOTE — Assessment & Plan Note (Signed)
encourged weight loss; work on 6 pounds of weight before next visit in 3 months; see AVS

## 2017-10-19 NOTE — Assessment & Plan Note (Signed)
Chronic, stable, controlled

## 2017-10-19 NOTE — Patient Instructions (Addendum)
Please return for a complete physical Consider using MyFitnessPal or other free app to figure out how many calories you are getting in each day Compare those with your calorie output with your exercise You need to burn off 250 more calories each day to lose 1/2 pound a week Check out the information at familydoctor.org entitled "Nutrition for Weight Loss: What You Need to Know about Fad Diets" Try to lose between 1-2 pounds per week by taking in fewer calories and burning off more calories You can succeed by limiting portions, limiting foods dense in calories and fat, becoming more active, and drinking 8 glasses of water a day (64 ounces) Don't skip meals, especially breakfast, as skipping meals may alter your metabolism Do not use over-the-counter weight loss pills or gimmicks that claim rapid weight loss A healthy BMI (or body mass index) is between 18.5 and 24.9 You can calculate your ideal BMI at the [place] website ClubMonetize.fr   Obesity, Adult Obesity is the condition of having too much total body fat. Being overweight or obese means that your weight is greater than what is considered healthy for your body size. Obesity is determined by a measurement called BMI. BMI is an estimate of body fat and is calculated from height and weight. For adults, a BMI of 30 or higher is considered obese. Obesity can eventually lead to other health concerns and major illnesses, including:  Stroke.  Coronary artery disease (CAD).  Type 2 diabetes.  Some types of cancer, including cancers of the colon, breast, uterus, and gallbladder.  Osteoarthritis.  High blood pressure (hypertension).  High cholesterol.  Sleep apnea.  Gallbladder stones.  Infertility problems.  What are the causes? The main cause of obesity is taking in (consuming) more calories than your body uses for energy. Other factors that contribute to this condition may  include:  Being born with genes that make you more likely to become obese.  Having a medical condition that causes obesity. These conditions include: ? Hypothyroidism. ? Polycystic ovarian syndrome (PCOS). ? Binge-eating disorder. ? Cushing syndrome.  Taking certain medicines, such as steroids, antidepressants, and seizure medicines.  Not being physically active (sedentary lifestyle).  Living where there are limited places to exercise safely or buy healthy foods.  Not getting enough sleep.  What increases the risk? The following factors may increase your risk of this condition:  Having a family history of obesity.  Being a woman of African-American descent.  Being a man of Hispanic descent.  What are the signs or symptoms? Having excessive body fat is the main symptom of this condition. How is this diagnosed? This condition may be diagnosed based on:  Your symptoms.  Your medical history.  A physical exam. Your health care provider may measure: ? Your BMI. If you are an adult with a BMI between 25 and less than 30, you are considered overweight. If you are an adult with a BMI of 30 or higher, you are considered obese. ? The distances around your hips and your waist (circumferences). These may be compared to each other to help diagnose your condition. ? Your skinfold thickness. Your health care provider may gently pinch a fold of your skin and measure it.  How is this treated? Treatment for this condition often includes changing your lifestyle. Treatment may include some or all of the following:  Dietary changes. Work with your health care provider and a dietitian to set a weight-loss goal that is healthy and reasonable for you. Dietary changes  may include eating: ? Smaller portions. A portion size is the amount of a particular food that is healthy for you to eat at one time. This varies from person to person. ? Low-calorie or low-fat options. ? More whole grains,  fruits, and vegetables.  Regular physical activity. This may include aerobic activity (cardio) and strength training.  Medicine to help you lose weight. Your health care provider may prescribe medicine if you are unable to lose 1 pound a week after 6 weeks of eating more healthily and doing more physical activity.  Surgery. Surgical options may include gastric banding and gastric bypass. Surgery may be done if: ? Other treatments have not helped to improve your condition. ? You have a BMI of 40 or higher. ? You have life-threatening health problems related to obesity.  Follow these instructions at home:  Eating and drinking   Follow recommendations from your health care provider about what you eat and drink. Your health care provider may advise you to: ? Limit fast foods, sweets, and processed snack foods. ? Choose low-fat options, such as low-fat milk instead of whole milk. ? Eat 5 or more servings of fruits or vegetables every day. ? Eat at home more often. This gives you more control over what you eat. ? Choose healthy foods when you eat out. ? Learn what a healthy portion size is. ? Keep low-fat snacks on hand. ? Avoid sugary drinks, such as soda, fruit juice, iced tea sweetened with sugar, and flavored milk. ? Eat a healthy breakfast.  Drink enough water to keep your urine clear or pale yellow.  Do not go without eating for long periods of time (do not fast) or follow a fad diet. Fasting and fad diets can be unhealthy and even dangerous. Physical Activity  Exercise regularly, as told by your health care provider. Ask your health care provider what types of exercise are safe for you and how often you should exercise.  Warm up and stretch before being active.  Cool down and stretch after being active.  Rest between periods of activity. Lifestyle  Limit the time that you spend in front of your TV, computer, or video game system.  Find ways to reward yourself that do not  involve food.  Limit alcohol intake to no more than 1 drink a day for nonpregnant women and 2 drinks a day for men. One drink equals 12 oz of beer, 5 oz of wine, or 1 oz of hard liquor. General instructions  Keep a weight loss journal to keep track of the food you eat and how much you exercise you get.  Take over-the-counter and prescription medicines only as told by your health care provider.  Take vitamins and supplements only as told by your health care provider.  Consider joining a support group. Your health care provider may be able to recommend a support group.  Keep all follow-up visits as told by your health care provider. This is important. Contact a health care provider if:  You are unable to meet your weight loss goal after 6 weeks of dietary and lifestyle changes. This information is not intended to replace advice given to you by your health care provider. Make sure you discuss any questions you have with your health care provider. Document Released: 02/03/2004 Document Revised: 05/31/2015 Document Reviewed: 10/14/2014 Elsevier Interactive Patient Education  2018 [place].  Preventing Unhealthy [employer], Adult Staying at a healthy weight is important. When fat builds up in your body, you  may become overweight or obese. These conditions put you at greater risk for developing certain health problems, such as heart disease, diabetes, sleeping problems, joint problems, and some cancers. Unhealthy weight gain is often the result of making unhealthy choices in what you eat. It is also a result of not getting enough exercise. You can make changes to your lifestyle to prevent obesity and stay as healthy as possible. What nutrition changes can be made? To maintain a healthy weight and prevent obesity:  Eat only as much as your body needs. To do this: ? Pay attention to signs that you are hungry or full. Stop eating as soon as you feel full. ? If you feel hungry, try drinking  water first. Drink enough water so your urine is clear or pale yellow. ? Eat smaller portions. ? Look at serving sizes on food labels. Most foods contain more than one serving per container. ? Eat the recommended amount of calories for your gender and activity level. While most active people should eat around 2,000 calories per day, if you are trying to lose weight or are not very active, you main need to eat less calories. Talk to your health care provider or dietitian about how many calories you should eat each day.  Choose healthy foods, such as: ? Fruits and vegetables. Try to fill at least half of your plate at each meal with fruits and vegetables. ? Whole grains, such as whole wheat bread, brown rice, and quinoa. ? Lean meats, such as chicken or fish. ? Other healthy proteins, such as beans, eggs, or tofu. ? Healthy fats, such as nuts, seeds, fatty fish, and olive oil. ? Low-fat or fat-free dairy.  Check food labels and avoid food and drinks that: ? Are high in calories. ? Have added sugar. ? Are high in sodium. ? Have saturated fats or trans fats.  Limit how much you eat of the following foods: ? Prepackaged meals. ? Fast food. ? Fried foods. ? Processed meat, such as bacon, sausage, and deli meats. ? Fatty cuts of red meat and poultry with skin.  Cook foods in healthier ways, such as by baking, broiling, or grilling.  When grocery shopping, try to shop around the outside of the store. This helps you buy mostly fresh foods and avoid canned and prepackaged foods.  What lifestyle changes can be made?  Exercise at least 30 minutes 5 or more days each week. Exercising includes brisk walking, yard work, biking, running, swimming, and team sports like basketball and soccer. Ask your health care provider which exercises are safe for you.  Do not use any products that contain nicotine or tobacco, such as cigarettes and e-cigarettes. If you need help quitting, ask your health care  provider.  Limit alcohol intake to no more than 1 drink a day for nonpregnant women and 2 drinks a day for men. One drink equals 12 oz of beer, 5 oz of wine, or 1 oz of hard liquor.  Try to get 7-9 hours of sleep each night. What other changes can be made?  Keep a food and activity journal to keep track of: ? What you ate and how many calories you had. Remember to count sauces, dressings, and side dishes. ? Whether you were active, and what exercises you did. ? Your calorie, weight, and activity goals.  Check your weight regularly. Track any changes. If you notice you have gained weight, make changes to your diet or activity routine.  Avoid taking weight-loss  medicines or supplements. Talk to your health care provider before starting any new medicine or supplement.  Talk to your health care provider before trying any new diet or exercise plan. Why are these changes important? Eating healthy, staying active, and having healthy habits not only help prevent obesity, they also:  Help you to manage stress and emotions.  Help you to connect with friends and family.  Improve your self-esteem.  Improve your sleep.  Prevent long-term health problems.  What can happen if changes are not made? Being obese or overweight can cause you to develop joint or bone problems, which can make it hard for you to stay active or do activities you enjoy. Being obese or overweight also puts stress on your heart and lungs and can lead to health problems like diabetes, heart disease, and some cancers. Where to find more information: Talk with your health care provider or a dietitian about healthy eating and healthy lifestyle choices. You may also find other information through these resources:  [place] Department of Agriculture MyPlate: FormerBoss.no  American Heart Association: www.heart.org  Centers for Disease Control and Prevention: http://www.wolf.info/  Summary  Staying at a healthy weight is  important. It helps prevent certain diseases and health problems, such as heart disease, diabetes, joint problems, sleep disorders, and some cancers.  Being obese or overweight can cause you to develop joint or bone problems, which can make it hard for you to stay active or do activities you enjoy.  You can prevent unhealthy weight gain by eating a healthy diet, exercising regularly, not smoking, limiting alcohol, and getting enough sleep.  Talk with your health care provider or a dietitian for guidance about healthy eating and healthy lifestyle choices. This information is not intended to replace advice given to you by your health care provider. Make sure you discuss any questions you have with your health care provider. Document Released: 12/28/2015 Document Revised: 02/02/2016 Document Reviewed: 02/02/2016 Elsevier Interactive Patient Education  [employer].

## 2017-10-19 NOTE — Assessment & Plan Note (Signed)
Foot exam by MD; check labs today; encouraged healthy eating and weight loss; eye exam UTD

## 2017-10-20 LAB — LIPID PANEL
CHOLESTEROL: 83 mg/dL (ref <200)
HDL: 37 mg/dL — ABNORMAL LOW (ref >50)
LDL CHOLESTEROL (CALC): 28 mg/dL
Non-HDL Cholesterol (Calc): 46 mg/dL (calc) (ref <130)
Total CHOL/HDL Ratio: 2.2 (calc) (ref <5.0)
Triglycerides: 96 mg/dL (ref <150)

## 2017-10-20 LAB — HEMOGLOBIN A1C
EAG (MMOL/L): 7.3 (calc)
Hgb A1c MFr Bld: 6.2 % of total Hgb — ABNORMAL HIGH (ref <5.7)
MEAN PLASMA GLUCOSE: 131 (calc)

## 2017-10-29 LAB — HM MAMMOGRAPHY

## 2017-11-06 ENCOUNTER — Other Ambulatory Visit: Payer: Self-pay | Admitting: Nurse Practitioner

## 2017-11-21 ENCOUNTER — Other Ambulatory Visit: Payer: Self-pay | Admitting: Family Medicine

## 2017-11-21 DIAGNOSIS — R42 Dizziness and giddiness: Secondary | ICD-10-CM

## 2018-01-08 ENCOUNTER — Other Ambulatory Visit: Payer: Self-pay | Admitting: Family Medicine

## 2018-01-08 DIAGNOSIS — E669 Obesity, unspecified: Principal | ICD-10-CM

## 2018-01-08 DIAGNOSIS — E1169 Type 2 diabetes mellitus with other specified complication: Secondary | ICD-10-CM

## 2018-01-08 NOTE — Telephone Encounter (Signed)
Please see care gaps She is due for a pap smear Overdue for eye exam Please resolve with patient

## 2018-01-21 ENCOUNTER — Encounter: Payer: Self-pay | Admitting: Family Medicine

## 2018-01-21 ENCOUNTER — Ambulatory Visit (INDEPENDENT_AMBULATORY_CARE_PROVIDER_SITE_OTHER): Payer: BC Managed Care – PPO | Admitting: Family Medicine

## 2018-01-21 VITALS — BP 116/70 | HR 72 | Temp 98.2°F | Ht 64.0 in | Wt 258.6 lb

## 2018-01-21 DIAGNOSIS — Z23 Encounter for immunization: Secondary | ICD-10-CM

## 2018-01-21 DIAGNOSIS — E1169 Type 2 diabetes mellitus with other specified complication: Secondary | ICD-10-CM | POA: Diagnosis not present

## 2018-01-21 DIAGNOSIS — Z0000 Encounter for general adult medical examination without abnormal findings: Secondary | ICD-10-CM | POA: Diagnosis not present

## 2018-01-21 DIAGNOSIS — E669 Obesity, unspecified: Secondary | ICD-10-CM

## 2018-01-21 NOTE — Assessment & Plan Note (Signed)
Encouraged weight loss; watch out for the foods out there at fast food, convenience type foods; encouraged cooking at home, healthier options; she declined referral to nutritionist; education given in AVS

## 2018-01-21 NOTE — Patient Instructions (Addendum)
Try to take 1000 iu of vitamin D3 daily on days when you are not getting much sunshine  Check out the information at familydoctor.org entitled "Nutrition for Weight Loss: What You Need to Know about Fad Diets" Try to lose between 1-2 pounds per week by taking in fewer calories and burning off more calories You can succeed by limiting portions, limiting foods dense in calories and fat, becoming more active, and drinking 8 glasses of water a day (64 ounces) Don't skip meals, especially breakfast, as skipping meals may alter your metabolism Do not use over-the-counter weight loss pills or gimmicks that claim rapid weight loss A healthy BMI (or body mass index) is between 18.5 and 24.9 You can calculate your ideal BMI at the [place] website ClubMonetize.fr   Obesity, Adult Obesity is the condition of having too much total body fat. Being overweight or obese means that your weight is greater than what is considered healthy for your body size. Obesity is determined by a measurement called BMI. BMI is an estimate of body fat and is calculated from height and weight. For adults, a BMI of 30 or higher is considered obese. Obesity can eventually lead to other health concerns and major illnesses, including:  Stroke.  Coronary artery disease (CAD).  Type 2 diabetes.  Some types of cancer, including cancers of the colon, breast, uterus, and gallbladder.  Osteoarthritis.  High blood pressure (hypertension).  High cholesterol.  Sleep apnea.  Gallbladder stones.  Infertility problems. What are the causes? The main cause of obesity is taking in (consuming) more calories than your body uses for energy. Other factors that contribute to this condition may include:  Being born with genes that make you more likely to become obese.  Having a medical condition that causes obesity. These conditions include: ? Hypothyroidism. ? Polycystic ovarian  syndrome (PCOS). ? Binge-eating disorder. ? Cushing syndrome.  Taking certain medicines, such as steroids, antidepressants, and seizure medicines.  Not being physically active (sedentary lifestyle).  Living where there are limited places to exercise safely or buy healthy foods.  Not getting enough sleep. What increases the risk? The following factors may increase your risk of this condition:  Having a family history of obesity.  Being a woman of African-American descent.  Being a man of Hispanic descent. What are the signs or symptoms? Having excessive body fat is the main symptom of this condition. How is this diagnosed? This condition may be diagnosed based on:  Your symptoms.  Your medical history.  A physical exam. Your health care provider may measure: ? Your BMI. If you are an adult with a BMI between 25 and less than 30, you are considered overweight. If you are an adult with a BMI of 30 or higher, you are considered obese. ? The distances around your hips and your waist (circumferences). These may be compared to each other to help diagnose your condition. ? Your skinfold thickness. Your health care provider may gently pinch a fold of your skin and measure it. How is this treated? Treatment for this condition often includes changing your lifestyle. Treatment may include some or all of the following:  Dietary changes. Work with your health care provider and a dietitian to set a weight-loss goal that is healthy and reasonable for you. Dietary changes may include eating: ? Smaller portions. A portion size is the amount of a particular food that is healthy for you to eat at one time. This varies from person to person. ? Low-calorie or  low-fat options. ? More whole grains, fruits, and vegetables.  Regular physical activity. This may include aerobic activity (cardio) and strength training.  Medicine to help you lose weight. Your health care provider may prescribe medicine  if you are unable to lose 1 pound a week after 6 weeks of eating more healthily and doing more physical activity.  Surgery. Surgical options may include gastric banding and gastric bypass. Surgery may be done if: ? Other treatments have not helped to improve your condition. ? You have a BMI of 40 or higher. ? You have life-threatening health problems related to obesity. Follow these instructions at home:  Eating and drinking   Follow recommendations from your health care provider about what you eat and drink. Your health care provider may advise you to: ? Limit fast foods, sweets, and processed snack foods. ? Choose low-fat options, such as low-fat milk instead of whole milk. ? Eat 5 or more servings of fruits or vegetables every day. ? Eat at home more often. This gives you more control over what you eat. ? Choose healthy foods when you eat out. ? Learn what a healthy portion size is. ? Keep low-fat snacks on hand. ? Avoid sugary drinks, such as soda, fruit juice, iced tea sweetened with sugar, and flavored milk. ? Eat a healthy breakfast.  Drink enough water to keep your urine clear or pale yellow.  Do not go without eating for long periods of time (do not fast) or follow a fad diet. Fasting and fad diets can be unhealthy and even dangerous. Physical Activity  Exercise regularly, as told by your health care provider. Ask your health care provider what types of exercise are safe for you and how often you should exercise.  Warm up and stretch before being active.  Cool down and stretch after being active.  Rest between periods of activity. Lifestyle  Limit the time that you spend in front of your TV, computer, or video game system.  Find ways to reward yourself that do not involve food.  Limit alcohol intake to no more than 1 drink a day for nonpregnant women and 2 drinks a day for men. One drink equals 12 oz of beer, 5 oz of wine, or 1 oz of hard liquor. General  instructions  Keep a weight loss journal to keep track of the food you eat and how much you exercise you get.  Take over-the-counter and prescription medicines only as told by your health care provider.  Take vitamins and supplements only as told by your health care provider.  Consider joining a support group. Your health care provider may be able to recommend a support group.  Keep all follow-up visits as told by your health care provider. This is important. Contact a health care provider if:  You are unable to meet your weight loss goal after 6 weeks of dietary and lifestyle changes. This information is not intended to replace advice given to you by your health care provider. Make sure you discuss any questions you have with your health care provider. Document Released: 02/03/2004 Document Revised: 05/31/2015 Document Reviewed: 10/14/2014 Elsevier Interactive Patient Education  2019 [employer].  Preventing Unhealthy [employer], Adult Staying at a healthy weight is important to your overall health. When fat builds up in your body, you may become overweight or obese. Being overweight or obese increases your risk of developing certain health problems, such as heart disease, diabetes, sleeping problems, joint problems, and some types of cancer. Unhealthy  weight gain is often the result of making unhealthy food choices or not getting enough exercise. You can make changes to your lifestyle to prevent obesity and stay as healthy as possible. What nutrition changes can be made?   Eat only as much as your body needs. To do this: ? Pay attention to signs that you are hungry or full. Stop eating as soon as you feel full. ? If you feel hungry, try drinking water first before eating. Drink enough water so your urine is clear or pale yellow. ? Eat smaller portions. Pay attention to portion sizes when eating out. ? Look at serving sizes on food labels. Most foods contain more than one serving per  container. ? Eat the recommended number of calories for your gender and activity level. For most active people, a daily total of 2,000 calories is appropriate. If you are trying to lose weight or are not very active, you may need to eat fewer calories. Talk with your health care provider or a diet and nutrition specialist (dietitian) about how many calories you need each day.  Choose healthy foods, such as: ? Fruits and vegetables. At each meal, try to fill at least half of your plate with fruits and vegetables. ? Whole grains, such as whole-wheat bread, brown rice, and quinoa. ? Lean meats, such as chicken or fish. ? Other healthy proteins, such as beans, eggs, or tofu. ? Healthy fats, such as nuts, seeds, fatty fish, and olive oil. ? Low-fat or fat-free dairy products.  Check food labels, and avoid food and drinks that: ? Are high in calories. ? Have added sugar. ? Are high in sodium. ? Have saturated fats or trans fats.  Cook foods in healthier ways, such as by baking, broiling, or grilling.  Make a meal plan for the week, and shop with a grocery list to help you stay on track with your purchases. Try to avoid going to the grocery store when you are hungry.  When grocery shopping, try to shop around the outside of the store first, where the fresh foods are. Doing this helps you to avoid prepackaged foods, which can be high in sugar, salt (sodium), and fat. What lifestyle changes can be made?   Exercise for 30 or more minutes on 5 or more days each week. Exercising may include brisk walking, yard work, biking, running, swimming, and team sports like basketball and soccer. Ask your health care provider which exercises are safe for you.  Do muscle-strengthening activities, such as lifting weights or using resistance bands, on 2 or more days a week.  Do not use any products that contain nicotine or tobacco, such as cigarettes and e-cigarettes. If you need help quitting, ask your health  care provider.  Limit alcohol intake to no more than 1 drink a day for nonpregnant women and 2 drinks a day for men. One drink equals 12 oz of beer, 5 oz of wine, or 1 oz of hard liquor.  Try to get 7-9 hours of sleep each night. What other changes can be made?  Keep a food and activity journal to keep track of: ? What you ate and how many calories you had. Remember to count the calories in sauces, dressings, and side dishes. ? Whether you were active, and what exercises you did. ? Your calorie, weight, and activity goals.  Check your weight regularly. Track any changes. If you notice you have gained weight, make changes to your diet or activity routine.  Avoid  taking weight-loss medicines or supplements. Talk to your health care provider before starting any new medicine or supplement.  Talk to your health care provider before trying any new diet or exercise plan. Why are these changes important? Eating healthy, staying active, and having healthy habits can help you to prevent obesity. Those changes also:  Help you manage stress and emotions.  Help you connect with friends and family.  Improve your self-esteem.  Improve your sleep.  Prevent long-term health problems. What can happen if changes are not made? Being obese or overweight can cause you to develop joint or bone problems, which can make it hard for you to stay active or do activities you enjoy. Being obese or overweight also puts stress on your heart and lungs and can lead to health problems like diabetes, heart disease, and some cancers. Where to find more information Talk with your health care provider or a dietitian about healthy eating and healthy lifestyle choices. You may also find information from:  [place]. Department of Agriculture, MyPlate: FormerBoss.no  American Heart Association: www.heart.org  Centers for Disease Control and Prevention: http://www.wolf.info/ Summary  Staying at a healthy weight is important  to your overall health. It helps you to prevent certain diseases and health problems, such as heart disease, diabetes, joint problems, sleep disorders, and some types of cancer.  Being obese or overweight can cause you to develop joint or bone problems, which can make it hard for you to stay active or do activities you enjoy.  You can prevent unhealthy weight gain by eating a healthy diet, exercising regularly, not smoking, limiting alcohol, and getting enough sleep.  Talk with your health care provider or a dietitian for guidance about healthy eating and healthy lifestyle choices. This information is not intended to replace advice given to you by your health care provider. Make sure you discuss any questions you have with your health care provider. Document Released: 12/28/2015 Document Revised: 10/06/2016 Document Reviewed: 02/02/2016 Elsevier Interactive Patient Education  2019 [place] Maintenance, Female Adopting a healthy lifestyle and getting preventive care can go a long way to promote health and wellness. Talk with your health care provider about what schedule of regular examinations is right for you. This is a good chance for you to check in with your provider about disease prevention and staying healthy. In between checkups, there are plenty of things you can do on your own. Experts have done a lot of research about which lifestyle changes and preventive measures are most likely to keep you healthy. Ask your health care provider for more information. Weight and diet Eat a healthy diet  Be sure to include plenty of vegetables, fruits, low-fat dairy products, and lean protein.  Do not eat a lot of foods high in solid fats, added sugars, or salt.  Get regular exercise. This is one of the most important things you can do for your health. ? Most adults should exercise for at least 150 minutes each week. The exercise should increase your heart rate and make you sweat  (moderate-intensity exercise). ? Most adults should also do strengthening exercises at least twice a week. This is in addition to the moderate-intensity exercise. Maintain a healthy weight  Body mass index (BMI) is a measurement that can be used to identify possible weight problems. It estimates body fat based on height and weight. Your health care provider can help determine your BMI and help you achieve or maintain a healthy weight.  For females 20 years  of age and older: ? A BMI below 18.5 is considered underweight. ? A BMI of 18.5 to 24.9 is normal. ? A BMI of 25 to 29.9 is considered overweight. ? A BMI of 30 and above is considered obese. Watch levels of cholesterol and blood lipids  You should start having your blood tested for lipids and cholesterol at 58 years of age, then have this test every 5 years.  You may need to have your cholesterol levels checked more often if: ? Your lipid or cholesterol levels are high. ? You are older than 58 years of age. ? You are at high risk for heart disease. Cancer screening Lung Cancer  Lung cancer screening is recommended for adults 39-79 years old who are at high risk for lung cancer because of a history of smoking.  A yearly low-dose CT scan of the lungs is recommended for people who: ? Currently smoke. ? Have quit within the past 15 years. ? Have at least a 30-pack-year history of smoking. A pack year is smoking an average of one pack of cigarettes a day for 1 year.  Yearly screening should continue until it has been 15 years since you quit.  Yearly screening should stop if you develop a health problem that would prevent you from having lung cancer treatment. Breast Cancer  Practice breast self-awareness. This means understanding how your breasts normally appear and feel.  It also means doing regular breast self-exams. Let your health care provider know about any changes, no matter how small.  If you are in your 20s or 30s, you  should have a clinical breast exam (CBE) by a health care provider every 1-3 years as part of a regular health exam.  If you are 67 or older, have a CBE every year. Also consider having a breast X-ray (mammogram) every year.  If you have a family history of breast cancer, talk to your health care provider about genetic screening.  If you are at high risk for breast cancer, talk to your health care provider about having an MRI and a mammogram every year.  Breast cancer gene (BRCA) assessment is recommended for women who have family members with BRCA-related cancers. BRCA-related cancers include: ? Breast. ? Ovarian. ? Tubal. ? Peritoneal cancers.  Results of the assessment will determine the need for genetic counseling and BRCA1 and BRCA2 testing. Cervical Cancer Your health care provider may recommend that you be screened regularly for cancer of the pelvic organs (ovaries, uterus, and vagina). This screening involves a pelvic examination, including checking for microscopic changes to the surface of your cervix (Pap test). You may be encouraged to have this screening done every 3 years, beginning at age 5.  For women ages 82-65, health care providers may recommend pelvic exams and Pap testing every 3 years, or they may recommend the Pap and pelvic exam, combined with testing for human papilloma virus (HPV), every 5 years. Some types of HPV increase your risk of cervical cancer. Testing for HPV may also be done on women of any age with unclear Pap test results.  Other health care providers may not recommend any screening for nonpregnant women who are considered low risk for pelvic cancer and who do not have symptoms. Ask your health care provider if a screening pelvic exam is right for you.  If you have had past treatment for cervical cancer or a condition that could lead to cancer, you need Pap tests and screening for cancer for at least 20  years after your treatment. If Pap tests have been  discontinued, your risk factors (such as having a new sexual partner) need to be reassessed to determine if screening should resume. Some women have medical problems that increase the chance of getting cervical cancer. In these cases, your health care provider may recommend more frequent screening and Pap tests. Colorectal Cancer  This type of cancer can be detected and often prevented.  Routine colorectal cancer screening usually begins at 58 years of age and continues through 58 years of age.  Your health care provider may recommend screening at an earlier age if you have risk factors for colon cancer.  Your health care provider may also recommend using home test kits to check for hidden blood in the stool.  A small camera at the end of a tube can be used to examine your colon directly (sigmoidoscopy or colonoscopy). This is done to check for the earliest forms of colorectal cancer.  Routine screening usually begins at age 104.  Direct examination of the colon should be repeated every 5-10 years through 58 years of age. However, you may need to be screened more often if early forms of precancerous polyps or small growths are found. Skin Cancer  Check your skin from head to toe regularly.  Tell your health care provider about any new moles or changes in moles, especially if there is a change in a mole's shape or color.  Also tell your health care provider if you have a mole that is larger than the size of a pencil eraser.  Always use sunscreen. Apply sunscreen liberally and repeatedly throughout the day.  Protect yourself by wearing long sleeves, pants, a wide-brimmed hat, and sunglasses whenever you are outside. Heart disease, diabetes, and high blood pressure  High blood pressure causes heart disease and increases the risk of stroke. High blood pressure is more likely to develop in: ? People who have blood pressure in the high end of the normal range (130-139/85-89 mm Hg). ? People  who are overweight or obese. ? People who are African American.  If you are 36-80 years of age, have your blood pressure checked every 3-5 years. If you are 36 years of age or older, have your blood pressure checked every year. You should have your blood pressure measured twice-once when you are at a hospital or clinic, and once when you are not at a hospital or clinic. Record the average of the two measurements. To check your blood pressure when you are not at a hospital or clinic, you can use: ? An automated blood pressure machine at a pharmacy. ? A home blood pressure monitor.  If you are between 38 years and 43 years old, ask your health care provider if you should take aspirin to prevent strokes.  Have regular diabetes screenings. This involves taking a blood sample to check your fasting blood sugar level. ? If you are at a normal weight and have a low risk for diabetes, have this test once every three years after 58 years of age. ? If you are overweight and have a high risk for diabetes, consider being tested at a younger age or more often. Preventing infection Hepatitis B  If you have a higher risk for hepatitis B, you should be screened for this virus. You are considered at high risk for hepatitis B if: ? You were born in a country where hepatitis B is common. Ask your health care provider which countries are considered high risk. ?  Your parents were born in a high-risk country, and you have not been immunized against hepatitis B (hepatitis B vaccine). ? You have HIV or AIDS. ? You use needles to inject street drugs. ? You live with someone who has hepatitis B. ? You have had sex with someone who has hepatitis B. ? You get hemodialysis treatment. ? You take certain medicines for conditions, including cancer, organ transplantation, and autoimmune conditions. Hepatitis C  Blood testing is recommended for: ? Everyone born from [birth year] through 1965. ? Anyone with known risk factors for  hepatitis C. Sexually transmitted infections (STIs)  You should be screened for sexually transmitted infections (STIs) including gonorrhea and chlamydia if: ? You are sexually active and are younger than 58 years of age. ? You are older than 59 years of age and your health care provider tells you that you are at risk for this type of infection. ? Your sexual activity has changed since you were last screened and you are at an increased risk for chlamydia or gonorrhea. Ask your health care provider if you are at risk.  If you do not have HIV, but are at risk, it may be recommended that you take a prescription medicine daily to prevent HIV infection. This is called pre-exposure prophylaxis (PrEP). You are considered at risk if: ? You are sexually active and do not regularly use condoms or know the HIV status of your partner(s). ? You take drugs by injection. ? You are sexually active with a partner who has HIV. Talk with your health care provider about whether you are at high risk of being infected with HIV. If you choose to begin PrEP, you should first be tested for HIV. You should then be tested every 3 months for as long as you are taking PrEP. Pregnancy  If you are premenopausal and you may become pregnant, ask your health care provider about preconception counseling.  If you may become pregnant, take 400 to 800 micrograms (mcg) of folic acid every day.  If you want to prevent pregnancy, talk to your health care provider about birth control (contraception). Osteoporosis and menopause  Osteoporosis is a disease in which the bones lose minerals and strength with aging. This can result in serious bone fractures. Your risk for osteoporosis can be identified using a bone density scan.  If you are 58 years of age or older, or if you are at risk for osteoporosis and fractures, ask your health care provider if you should be screened.  Ask your health care provider whether you should take a calcium  or vitamin D supplement to lower your risk for osteoporosis.  Menopause may have certain physical symptoms and risks.  Hormone replacement therapy may reduce some of these symptoms and risks. Talk to your health care provider about whether hormone replacement therapy is right for you. Follow these instructions at home:  Schedule regular health, dental, and eye exams.  Stay current with your immunizations.  Do not use any tobacco products including cigarettes, chewing tobacco, or electronic cigarettes.  If you are pregnant, do not drink alcohol.  If you are breastfeeding, limit how much and how often you drink alcohol.  Limit alcohol intake to no more than 1 drink per day for nonpregnant women. One drink equals 12 ounces of beer, 5 ounces of wine, or 1 ounces of hard liquor.  Do not use street drugs.  Do not share needles.  Ask your health care provider for help if you need support or  information about quitting drugs.  Tell your health care provider if you often feel depressed.  Tell your health care provider if you have ever been abused or do not feel safe at home. This information is not intended to replace advice given to you by your health care provider. Make sure you discuss any questions you have with your health care provider. Document Released: 07/11/2010 Document Revised: 06/03/2015 Document Reviewed: 09/29/2014 Elsevier Interactive Patient Education  2019 [employer].

## 2018-01-21 NOTE — Progress Notes (Signed)
BP 116/70   Pulse 72   Temp 98.2 F (36.8 C)   Ht _0  (1.626 m)   Wt 258 lb 9.6 oz (117.3 kg)   LMP  (LMP Unknown)   SpO2 97%   BMI 44.39 kg/m    Subjective:    Patient ID: Elizabeth Russo, female    DOB: 1960-02-19, 58 y.o.   MRN: [id number]  HPI: Elizabeth Russo is a 58 y.o. female  Chief Complaint  Patient presents with  . Annual Exam    HPI Patient is here for her physical  USPSTF grade A and B recommendations Depression:  Depression screen [facility] 2/9 01/21/2018 10/19/2017 09/20/2017 07/13/2017 04/13/2017  Decreased Interest 0 0 0 0 0  Down, Depressed, Hopeless 0 0 0 0 0  PHQ - 2 Score 0 0 0 0 0  Altered sleeping 0 0 - - -  Tired, decreased energy 0 2 - - -  Change in appetite 0 0 - - -  Feeling bad or failure about yourself  0 0 - - -  Trouble concentrating 0 0 - - -  Moving slowly or fidgety/restless 0 0 - - -  Suicidal thoughts 0 0 - - -  PHQ-9 Score 0 2 - - -  Difficult doing work/chores Not difficult at all Not difficult at all - - -   Hypertension: BP Readings from Last 3 Encounters:  01/21/18 116/70  10/19/17 120/84  09/20/17 130/84   Obesity: Wt Readings from Last 3 Encounters:  01/21/18 258 lb 9.6 oz (117.3 kg)  10/19/17 253 lb 11.2 oz (115.1 kg)  09/20/17 254 lb 8 oz (115.4 kg)   BMI Readings from Last 3 Encounters:  01/21/18 44.39 kg/m  10/19/17 43.55 kg/m  09/20/17 43.68 kg/m     Skin cancer: nothing worrisome Lung cancer:  Former smoker, quit and does not qualify for yearly chest CT Breast cancer: no lumps; mammo Octo 2019 Colorectal cancer: Aug 2019; 5 year pass Cervical cancer screening: s/p hysterectomy, fibroids, no cancer; last pap smear reviewed, no endocervical cells BRCA gene screening: family hx of breast and/or ovarian cancer and/or metastatic prostate cancer? no HIV, hep B, hep C: not interested STD testing and prevention (chl/gon/syphilis): not interested Intimate partner violence: no abuse Contraception:  n/a Osteoporosis: no Fall prevention/vitamin D: discussed Immunizations: flu shot UTD; may need 2nd shingrix today and staff checking Diet: some apples and blueberries; diet got worse over the holidays, "I was terrible"; stress; more chicken than red meat; maybe red meat once a week; oatmeal every morning Exercise: nothing really; gets outside in the yard when the weather is nice; walks in stores; has not seen heart doctor, she has not gone yet Alcohol:    [facility] Visit from [facility]/21/2018 in [facility]  AUDIT-C Score  2    not more than 7 drinks per week Tobacco use: addressed AAA: n/a Aspirin: taking aspirin, no bleeding; she still has not seen her cardiologist (I urged her to do so, get back in for regular f/u) Glucose:  Glucose  Date Value Ref Range Status  01/19/2011 183 (H) 65 - 99 mg/dL Final   Glucose, Bld  Date Value Ref Range Status  07/13/2017 100 (H) 65 - 99 mg/dL Final    Comment:    .            Fasting reference interval . For someone without known diabetes, a glucose value between 100 and 125 mg/dL is consistent with prediabetes and  should be confirmed with a follow-up test. .   01/12/2017 95 65 - 99 mg/dL Final    Comment:    .            Fasting reference interval .   10/11/2016 103 (H) 65 - 99 mg/dL Final    Comment:    .            Fasting reference interval . For someone without known diabetes, a glucose value between 100 and 125 mg/dL is consistent with prediabetes and should be confirmed with a follow-up test. .    Glucose-Capillary  Date Value Ref Range Status  08/13/2017 83 70 - 99 mg/dL Final   Lipids:  Lab Results  Component Value Date   Russo 83 10/19/2017   Russo 159 07/13/2017   [place] 153 01/12/2017   Lab Results  Component Value Date   HDL 37 (L) 10/19/2017   HDL 39 (L) 07/13/2017   HDL 41 (L) 01/12/2017   Lab Results  Component Value Date   LDLCALC 28 10/19/2017   LDLCALC 100 (H) 07/13/2017   LDLCALC 97  01/12/2017   Lab Results  Component Value Date   TRIG 96 10/19/2017   TRIG 102 07/13/2017   TRIG 65 01/12/2017   Lab Results  Component Value Date   CHOLHDL 2.2 10/19/2017   CHOLHDL 4.1 07/13/2017   CHOLHDL 3.7 01/12/2017   No results found for: LDLDIRECT   Depression screen [facility] 2/9 01/21/2018 10/19/2017 09/20/2017 07/13/2017 04/13/2017  Decreased Interest 0 0 0 0 0  Down, Depressed, Hopeless 0 0 0 0 0  PHQ - 2 Score 0 0 0 0 0  Altered sleeping 0 0 - - -  Tired, decreased energy 0 2 - - -  Change in appetite 0 0 - - -  Feeling bad or failure about yourself  0 0 - - -  Trouble concentrating 0 0 - - -  Moving slowly or fidgety/restless 0 0 - - -  Suicidal thoughts 0 0 - - -  PHQ-9 Score 0 2 - - -  Difficult doing work/chores Not difficult at all Not difficult at all - - -   Fall Risk  01/21/2018 10/19/2017 09/20/2017 07/13/2017 04/13/2017  Falls in the past year? 0 No No No No  Number falls in past yr: - - - - -  Injury with Fall? - - - - -    Relevant past medical, surgical, family and social history reviewed Past Medical History:  Diagnosis Date  . CAD (coronary artery disease)   . Diabetes mellitus without complication ([place])   . Diverticulosis   . Hypertension   . Hypothyroid   . Morbid obesity ([place]) 06/26/2014  . Myalgia due to statin 04/13/2017   Intolerant to three different statins  . Vertigo    Past Surgical History:  Procedure Laterality Date  . ABDOMINAL HYSTERECTOMY    . COLONOSCOPY WITH PROPOFOL N/A 08/13/2017   Procedure: COLONOSCOPY WITH PROPOFOL;  Surgeon: Lin Landsman, MD;  Location: [facility] ENDOSCOPY;  Service: Gastroenterology;  Laterality: N/A;  . heart stent  2012   Family History  Problem Relation Age of Onset  . Heart disease Mother   . Diabetes Mother   . Hypertension Mother   . Hyperlipidemia Mother   . Thyroid disease Mother   . Cancer Father        colon  . Diabetes Father   . Hypertension Father   . Diabetes Sister   . Hypertension  Sister   . Heart disease Sister   . Hyperlipidemia Sister   . Kidney disease Sister   . Cancer Maternal Grandfather        lung  . Heart attack Paternal Grandfather   . Heart disease Sister   . Diabetes Sister   . Heart disease Sister   . Arthritis Sister    Social History   Tobacco Use  . Smoking status: Former Smoker    Packs/day: 0.50    Years: 30.00    Pack years: 15.00    Types: Cigarettes    Start date: 01/09/1978    Last attempt to quit: 03/09/2013    Years since quitting: 4.8  . Smokeless tobacco: Never Used  Substance Use Topics  . Alcohol use: Yes    Alcohol/week: 0.0 standard drinks    Comment: Socially   . Drug use: No     Office Visit from 01/21/2018 in [facility]  AUDIT-C Score  2     Interim medical history since last visit reviewed. Allergies and medications reviewed  Review of Systems Per HPI unless specifically indicated above     Objective:    BP 116/70   Pulse 72   Temp 98.2 F (36.8 C)   Ht _0  (1.626 m)   Wt 258 lb 9.6 oz (117.3 kg)   LMP  (LMP Unknown)   SpO2 97%   BMI 44.39 kg/m   Wt Readings from Last 3 Encounters:  01/21/18 258 lb 9.6 oz (117.3 kg)  10/19/17 253 lb 11.2 oz (115.1 kg)  09/20/17 254 lb 8 oz (115.4 kg)    Physical Exam Constitutional:      Appearance: She is well-developed. She is obese.  HENT:     Head: Normocephalic and atraumatic.     Right Ear: Hearing, tympanic membrane, ear canal and external ear normal.     Left Ear: Hearing, tympanic membrane, ear canal and external ear normal.  Eyes:     General: No scleral icterus.       Right eye: No hordeolum.        Left eye: No hordeolum.     Conjunctiva/sclera: Conjunctivae normal.  Neck:     Thyroid: No thyromegaly.     Vascular: No carotid bruit.  Cardiovascular:     Rate and Rhythm: Normal rate and regular rhythm.  No extrasystoles are present.    Heart sounds: Normal heart sounds, S1 normal and S2 normal.  Pulmonary:     Effort:  Pulmonary effort is normal. No respiratory distress.     Breath sounds: Normal breath sounds.  Chest:     Breasts: Breasts are symmetrical.        Right: No inverted nipple, mass, nipple discharge, skin change or tenderness.        Left: No inverted nipple, mass, nipple discharge, skin change or tenderness.  Abdominal:     General: Bowel sounds are normal. There is no distension or abdominal bruit.     Palpations: Abdomen is soft. There is no mass or pulsatile mass.     Tenderness: There is no abdominal tenderness.     Hernia: No hernia is present.  Musculoskeletal: Normal range of motion.  Lymphadenopathy:     Head:     Right side of head: No submandibular adenopathy.     Left side of head: No submandibular adenopathy.     Cervical: No cervical adenopathy.  Skin:    General: Skin is warm and dry.  Coloration: Skin is not pale.     Findings: No bruising or ecchymosis.  Neurological:     General: No focal deficit present.     Mental Status: She is alert.     Motor: No tremor or abnormal muscle tone.     Deep Tendon Reflexes:     Reflex Scores:      Patellar reflexes are 2+ on the right side and 2+ on the left side. Psychiatric:        Mood and Affect: Mood is not anxious or depressed.        Speech: Speech normal.        Behavior: Behavior normal.        Thought Content: Thought content normal.    Diabetic Foot Form - Detailed   Diabetic Foot Exam - detailed Diabetic Foot exam was performed with the following findings:  Yes 01/21/2018  8:45 AM  Visual Foot Exam completed.:  Yes  Pulse Foot Exam completed.:  Yes  Right Dorsalis Pedis:  Present Left Dorsalis Pedis:  Present  Sensory Foot Exam Completed.:  Yes Semmes-Weinstein Monofilament Test R Site 1-Great Toe:  Pos L Site 1-Great Toe:  Pos         Results for orders placed or performed in visit on 10/30/17  HM MAMMOGRAPHY  Result Value Ref Range   HM Mammogram 0-4 Bi-Rad 0-4 Bi-Rad, Self Reported Normal        Assessment & Plan:   Problem List Items Addressed This Visit      Endocrine   Diabetes mellitus type 2 in obese ([place])   Relevant Orders   Microalbumin / creatinine urine ratio     Other   Preventative health care - Primary    USPSTF grade A and B recommendations reviewed with patient; age-appropriate recommendations, preventive care, screening tests, etc discussed and encouraged; healthy living encouraged; see AVS for patient education given to patient       Relevant Orders   CBC with Differential/Platelet   COMPLETE METABOLIC PANEL WITH GFR   Lipid panel   TSH   Morbid obesity ([place]) (Chronic)    Encouraged weight loss; watch out for the foods out there at fast food, convenience type foods; encouraged cooking at home, healthier options; she declined referral to nutritionist; education given in AVS       Other Visit Diagnoses    Need for shingles vaccine       Relevant Orders   Varicella-zoster vaccine IM (Shingrix) (Completed)       Follow up plan: Return in about 1 year (around 01/22/2019) for complete physical; April 12th or later for diabetes.  An after-visit summary was printed and given to the patient at [place].  Please see the patient instructions which may contain other information and recommendations beyond what is mentioned above in the assessment and plan.  No orders of the defined types were placed in this encounter.   Orders Placed This Encounter  Procedures  . Varicella-zoster vaccine IM (Shingrix)  . CBC with Differential/Platelet  . COMPLETE METABOLIC PANEL WITH GFR  . Lipid panel  . TSH  . Microalbumin / creatinine urine ratio   Pap smear not needed; asked staff to remove from health maintenance today

## 2018-01-21 NOTE — Assessment & Plan Note (Signed)
USPSTF grade A and B recommendations reviewed with patient; age-appropriate recommendations, preventive care, screening tests, etc discussed and encouraged; healthy living encouraged; see AVS for patient education given to patient  

## 2018-01-22 LAB — CBC WITH DIFFERENTIAL/PLATELET
Absolute Monocytes: 497 cells/uL (ref 200–950)
BASOS ABS: 59 {cells}/uL (ref 0–200)
Basophils Relative: 1.1 %
EOS ABS: 162 {cells}/uL (ref 15–500)
EOS PCT: 3 %
HCT: 39.9 % (ref 35.0–45.0)
Hemoglobin: 13.6 g/dL (ref 11.7–15.5)
Lymphs Abs: 2365 cells/uL (ref 850–3900)
MCH: 29.7 pg (ref 27.0–33.0)
MCHC: 34.1 g/dL (ref 32.0–36.0)
MCV: 87.1 fL (ref 80.0–100.0)
MPV: 8.8 fL (ref 7.5–12.5)
Monocytes Relative: 9.2 %
NEUTROS PCT: 42.9 %
Neutro Abs: 2317 cells/uL (ref 1500–7800)
Platelets: 241 10*3/uL (ref 140–400)
RBC: 4.58 10*6/uL (ref 3.80–5.10)
RDW: 13.1 % (ref 11.0–15.0)
Total Lymphocyte: 43.8 %
WBC: 5.4 10*3/uL (ref 3.8–10.8)

## 2018-01-22 LAB — COMPLETE METABOLIC PANEL WITH GFR
AG Ratio: 1.1 (calc) (ref 1.0–2.5)
ALBUMIN MSPROF: 3.9 g/dL (ref 3.6–5.1)
ALT: 18 U/L (ref 6–29)
AST: 20 U/L (ref 10–35)
Alkaline phosphatase (APISO): 83 U/L (ref 33–130)
BUN: 10 mg/dL (ref 7–25)
CALCIUM: 9.1 mg/dL (ref 8.6–10.4)
CHLORIDE: 105 mmol/L (ref 98–110)
CO2: 27 mmol/L (ref 20–32)
CREATININE: 0.83 mg/dL (ref 0.50–1.05)
GFR, Est African American: 91 mL/min/{1.73_m2} (ref >=60)
GFR, Est Non African American: 78 mL/min/{1.73_m2} (ref >=60)
GLOBULIN: 3.5 g/dL (ref 1.9–3.7)
Glucose, Bld: 113 mg/dL — ABNORMAL HIGH (ref 65–99)
POTASSIUM: 4 mmol/L (ref 3.5–5.3)
Sodium: 138 mmol/L (ref 135–146)
TOTAL PROTEIN: 7.4 g/dL (ref 6.1–8.1)
Total Bilirubin: 0.3 mg/dL (ref 0.2–1.2)

## 2018-01-22 LAB — LIPID PANEL
CHOLESTEROL: 114 mg/dL (ref <200)
HDL: 42 mg/dL — ABNORMAL LOW (ref >50)
LDL CHOLESTEROL (CALC): 56 mg/dL
Non-HDL Cholesterol (Calc): 72 mg/dL (calc) (ref <130)
Total CHOL/HDL Ratio: 2.7 (calc) (ref <5.0)
Triglycerides: 82 mg/dL (ref <150)

## 2018-01-22 LAB — MICROALBUMIN / CREATININE URINE RATIO
CREATININE, URINE: 116 mg/dL (ref 20–275)
MICROALB/CREAT RATIO: 2 ug/mg{creat} (ref <30)
Microalb, Ur: 0.2 mg/dL

## 2018-01-22 LAB — TSH: TSH: 1.31 m[IU]/L (ref 0.40–4.50)

## 2018-01-31 ENCOUNTER — Other Ambulatory Visit: Payer: Self-pay

## 2018-01-31 MED ORDER — EVOLOCUMAB 140 MG/ML ~~LOC~~ SOAJ: 1.0000 | SUBCUTANEOUS | 5 refills | Status: DC

## 2018-02-05 ENCOUNTER — Other Ambulatory Visit: Payer: Self-pay | Admitting: Family Medicine

## 2018-02-05 DIAGNOSIS — R42 Dizziness and giddiness: Secondary | ICD-10-CM

## 2018-02-05 NOTE — Telephone Encounter (Signed)
Lab Results  Component Value Date   HGBA1C 6.2 (H) 10/19/2017   Lab Results  Component Value Date   CREATININE 0.83 01/21/2018

## 2018-02-13 LAB — HM DIABETES EYE EXAM

## 2018-03-06 ENCOUNTER — Other Ambulatory Visit: Payer: Self-pay | Admitting: Family Medicine

## 2018-03-06 NOTE — Telephone Encounter (Signed)
Lab Results  Component Value Date   TSH 1.31 01/21/2018    

## 2018-03-18 ENCOUNTER — Other Ambulatory Visit: Payer: Self-pay | Admitting: Family Medicine

## 2018-03-19 ENCOUNTER — Other Ambulatory Visit: Payer: Self-pay | Admitting: Family Medicine

## 2018-04-09 ENCOUNTER — Other Ambulatory Visit: Payer: Self-pay | Admitting: Family Medicine

## 2018-04-09 NOTE — Telephone Encounter (Signed)
Lab Results  Component Value Date   CREATININE 0.83 01/21/2018   Lab Results  Component Value Date   ALT 18 01/21/2018

## 2018-04-09 NOTE — Telephone Encounter (Signed)
Lab Results  Component Value Date   TSH 1.31 01/21/2018

## 2018-04-22 ENCOUNTER — Other Ambulatory Visit: Payer: Self-pay

## 2018-04-22 ENCOUNTER — Ambulatory Visit: Payer: BC Managed Care – PPO | Admitting: Family Medicine

## 2018-04-22 ENCOUNTER — Telehealth: Payer: Self-pay | Admitting: Family Medicine

## 2018-04-22 ENCOUNTER — Encounter: Payer: Self-pay | Admitting: Family Medicine

## 2018-04-22 DIAGNOSIS — E669 Obesity, unspecified: Secondary | ICD-10-CM

## 2018-04-22 DIAGNOSIS — I2510 Atherosclerotic heart disease of native coronary artery without angina pectoris: Secondary | ICD-10-CM

## 2018-04-22 DIAGNOSIS — E1169 Type 2 diabetes mellitus with other specified complication: Secondary | ICD-10-CM | POA: Diagnosis not present

## 2018-04-22 DIAGNOSIS — E782 Mixed hyperlipidemia: Secondary | ICD-10-CM

## 2018-04-22 DIAGNOSIS — I10 Essential (primary) hypertension: Secondary | ICD-10-CM

## 2018-04-22 LAB — POCT GLYCOSYLATED HEMOGLOBIN (HGB A1C)
HbA1c POC (<> result, manual entry): 5.9 % (ref 4.0–5.6)
HbA1c, POC (controlled diabetic range): 5.9 % (ref 0.0–7.0)
HbA1c, POC (prediabetic range): 5.9 % (ref 5.7–6.4)
Hemoglobin A1C: 5.9 % — AB (ref 4.0–5.6)

## 2018-04-22 NOTE — Assessment & Plan Note (Signed)
Try to follow DASH guidelines; take medicine when she can; try to lose weight

## 2018-04-22 NOTE — Progress Notes (Signed)
BP 134/84   Pulse 64   Temp 98.1 F (36.7 C) (Oral)   Resp 14   Ht 5\' 4"  (1.626 m)   Wt 262 lb 8 oz (119.1 kg)   LMP  (LMP Unknown)   SpO2 98%   BMI 45.06 kg/m    Subjective:    Patient ID: Elizabeth Russo, female    DOB: 11/22/1960, 58 y.o.   MRN: [id number]  HPI: Elizabeth Russo is a 58 y.o. female  Chief Complaint  Patient presents with  . Follow-up    HPI Virtual Visit via Telephone/Video Note   I connected with the patient via:  telephone I verified that I am speaking with the correct person using two identifiers.  Call started: 8:12 am Call terminated: 8:36 am Total length of call: 24 minutes and 5 seconds   I discussed the limitations, risks, and privacy concerns of performing an evaluation and management service by telephone and the availability of in-person appointments. I explained that he/she may be responsible for charges related to this service. The patient expressed understanding and agreed to proceed.  Provider location: home, upstairs office with door closed, earphones/headset on Patient location: at the office, in exam room with door closed Additional participants: none  COVID-19 discussed, she is at high risk for complications; working, taking precautions  Type 2 diabetes She does not know if this one will be better We discussed eating in the context of pandemic; eating healthy food, veggies and fruits Checking FSBS, once a week or so, 106; no low sugars No problems with feet other than swelling when she is up all day; goes down over night Lab Results  Component Value Date   HGBA1C 5.9 (A) 04/22/2018   HGBA1C 5.9 04/22/2018   HGBA1C 5.9 04/22/2018   HGBA1C 5.9 04/22/2018   High cholesterol Trying to stay away from burgers and fried foods; eating more fruits and veggies Eating a lot of salads  Lab Results  Component Value Date   Russo 114 01/21/2018   HDL 42 (L) 01/21/2018   [place] 56 01/21/2018   TRIG 82 01/21/2018   CHOLHDL 2.7  01/21/2018    Morbid obesity; she feels a little bit lighter, but her weight has gone up; trying to walk more; it "comes right back to me"; she is trying to limit her portions; oatmeal in morning; apple at lunch; dinner might be a salad and a meat with her salad; trying to drink plenty of water; using regular salad dressing  HTN; was checking at home, but staying away from occupation health with Coronavirus pandemic; did not take her medicine today because she is fasting  Hypothyroidism; not losing any weight; moving bowels okay, might skip a day, sometimes twice a day; not constipation Lab Results  Component Value Date   TSH 1.31 01/21/2018   History of heart attack; she had a visit but actually canceled the appointment because she did not have the copay; she will call to reschedule; she can do that through [place];    Depression screen [facility] 2/9 04/22/2018 01/21/2018 10/19/2017 09/20/2017 07/13/2017  Decreased Interest 0 0 0 0 0  Down, Depressed, Hopeless 0 0 0 0 0  PHQ - 2 Score 0 0 0 0 0  Altered sleeping 0 0 0 - -  Tired, decreased energy 0 0 2 - -  Change in appetite 0 0 0 - -  Feeling bad or failure about yourself  0 0 0 - -  Trouble concentrating 0 0  0 - -  Moving slowly or fidgety/restless 0 0 0 - -  Suicidal thoughts 0 0 0 - -  PHQ-9 Score 0 0 2 - -  Difficult doing work/chores Not difficult at all Not difficult at all Not difficult at all - -   Fall Risk  04/22/2018 01/21/2018 10/19/2017 09/20/2017 07/13/2017  Falls in the past year? 0 0 No No No  Number falls in past yr: 0 - - - -  Injury with Fall? 0 - - - -    Relevant past medical, surgical, family and social history reviewed Past Medical History:  Diagnosis Date  . CAD (coronary artery disease)   . Diabetes mellitus without complication ([place])   . Diverticulosis   . Hypertension   . Hypothyroid   . Morbid obesity ([place]) 06/26/2014  . Myalgia due to statin 04/13/2017   Intolerant to three different statins  . Vertigo     Past Surgical History:  Procedure Laterality Date  . ABDOMINAL HYSTERECTOMY    . COLONOSCOPY WITH PROPOFOL N/A 08/13/2017   Procedure: COLONOSCOPY WITH PROPOFOL;  Surgeon: Lin Landsman, MD;  [facility] END[facility]COPY;  Service: Gastroenterology;  Laterality: N/A;  . heart stent  2012   Family History  Problem Relation Age of Onset  . Heart disease Mother   . Diabetes Mother   . Hypertension Mother   . Hyperlipidemia Mother   . Thyroid disease Mother   . Cancer Father        colon  . Diabetes Father   . Hypertension Father   . Diabetes Sister   . Hypertension Sister   . Heart disease Sister   . Hyperlipidemia Sister   . Kidney disease Sister   . Cancer Maternal Grandfather        lung  . Heart attack Paternal Grandfather   . Heart disease Sister   . Diabetes Sister   . Heart disease Sister   . Arthritis Sister    Social History   Tobacco Use  . Smoking status: Former Smoker    Packs/day: 0.50    Years: 30.00    Pack years: 15.00    Types: Cigarettes    Start date: 01/09/1978    Last attempt to quit: 03/09/2013    Years since quitting: 5.1  . Smokeless tobacco: Never Used  Substance Use Topics  . Alcohol use: Yes    Alcohol/week: 0.0 standard drinks    Comment: Socially   . Drug use: No     Office Visit from 04/22/2018 in [facility]  AUDIT-C Score  1      Interim medical history since last visit reviewed. Allergies and medications reviewed  Review of Systems  Constitutional: Negative for unexpected weight change.  Cardiovascular: Positive for palpitations. Negative for chest pain.   Per HPI unless specifically indicated above     Objective:    BP 134/84   Pulse 64   Temp 98.1 F (36.7 C) (Oral)   Resp 14   Ht 5\' 4"  (1.626 m)   Wt 262 lb 8 oz (119.1 kg)   LMP  (LMP Unknown)   SpO2 98%   BMI 45.06 kg/m   Wt Readings from Last 3 Encounters:  04/22/18 262 lb 8 oz (119.1 kg)  01/21/18 258 lb 9.6 oz (117.3 kg)  10/19/17 253  lb 11.2 oz (115.1 kg)    Physical Exam Pulmonary:     Effort: No respiratory distress.  Neurological:     Mental Status: She  is alert.     Cranial Nerves: No dysarthria.  Psychiatric:        Speech: Speech is not rapid and pressured, delayed or slurred.     Results for orders placed or performed in visit on 04/22/18  POCT HgB A1C  Result Value Ref Range   Hemoglobin A1C 5.9 (A) 4.0 - 5.6 %   HbA1c POC (<> result, manual entry) 5.9 4.0 - 5.6 %   HbA1c, POC (prediabetic range) 5.9 5.7 - 6.4 %   HbA1c, POC (controlled diabetic range) 5.9 0.0 - 7.0 %      Assessment & Plan:   Problem List Items Addressed This Visit      Cardiovascular and Mediastinum   Essential hypertension, benign (Chronic)    Try to follow DASH guidelines; take medicine when she can; try to lose weight      Coronary atherosclerosis (Chronic)    Encouraged patient to see cardiologist; continue aspirin        Endocrine   Diabetes mellitus type 2 in obese ([facility])    Check A1c today; urine microalb:cr normal; advised to really work on weight loss; trying to eat better; eye exam UTD; she is at high risk for [id number] complications I advised, follow all precautions, stay home when can, wash hands, don't touch face      Relevant Orders   POCT HgB A1C (Completed)     Other   Morbid obesity ([place]) (Chronic)    Offered referral to bariatric surgeon; try one free apps to monitor calories; switch to fat free dressing      Hyperlipidemia    Last HDL not to goal, goal is over; weight loss and walking, start low and build up gradually as cardiologist says okay          Follow up plan: Return in about 3 months (around 07/22/2018) for follow-up visit with Dr. Sanda Klein in person.  An after-visit summary was printed and given to the patient at [place].  Please see the patient instructions which may contain other information and recommendations beyond what is mentioned above in the assessment and plan.  No orders of  the defined types were placed in this encounter.   Orders Placed This Encounter  Procedures  . POCT HgB A1C

## 2018-04-22 NOTE — Assessment & Plan Note (Addendum)
Check A1c today; urine microalb:cr normal; advised to really work on weight loss; trying to eat better; eye exam UTD; she is at high risk for [id number]-17 complications I advised, follow all precautions, stay home when can, wash hands, don't touch face

## 2018-04-22 NOTE — Telephone Encounter (Signed)
Please schedule patient for an appointment With whom: Dr. Sanda Klein When: 3 months How: In person Why: diabetes Thank you

## 2018-04-22 NOTE — Assessment & Plan Note (Signed)
Last HDL not to goal, goal is over; weight loss and walking, start low and build up gradually as cardiologist says okay

## 2018-04-22 NOTE — Assessment & Plan Note (Signed)
Encouraged patient to see cardiologist; continue aspirin

## 2018-04-22 NOTE — Assessment & Plan Note (Signed)
Offered referral to bariatric surgeon; try one free apps to monitor calories; switch to fat free dressing

## 2018-04-22 NOTE — Patient Instructions (Signed)
Try to follow the DASH guidelines (DASH stands for Dietary Approaches to Stop Hypertension). Try to limit the sodium in your diet to no more than 1,500mg  of sodium per day. Certainly try to not exceed 2,000 mg per day at the very most. Do not add salt when cooking or at the table.  Check the sodium amount on labels when shopping, and choose items lower in sodium when given a choice. Avoid or limit foods that already contain a lot of sodium. Eat a diet rich in fruits and vegetables and whole grains, and try to lose weight if overweight or obese  Check out the information at familydoctor.org entitled "Nutrition for Weight Loss: What You Need to Know about Fad Diets" Try to lose between 1-2 pounds per week by taking in fewer calories and burning off more calories You can succeed by limiting portions, limiting foods dense in calories and fat, becoming more active, and drinking 8 glasses of water a day (64 ounces) Don't skip meals, especially breakfast, as skipping meals may alter your metabolism Do not use over-the-counter weight loss pills or gimmicks that claim rapid weight loss A healthy BMI (or body mass index) is between 18.5 and 24.9 You can calculate your ideal BMI at the [place] website [employer]fr  Please do see your eye doctor regularly, and have your eyes examined every year (or more often per his or her recommendation) Check your feet every night and let me know right away of any sores, infections, numbness, etc. Try to limit sweets, white bread, white rice, white potatoes It is okay with me for you to not check your fingerstick blood sugars unless you are interested and feel it would be helpful for you

## 2018-05-22 ENCOUNTER — Encounter: Payer: Self-pay | Admitting: Family Medicine

## 2018-06-05 ENCOUNTER — Other Ambulatory Visit: Payer: Self-pay

## 2018-06-05 ENCOUNTER — Encounter: Payer: Self-pay | Admitting: Family Medicine

## 2018-06-05 ENCOUNTER — Ambulatory Visit: Payer: BC Managed Care – PPO | Admitting: Family Medicine

## 2018-06-05 VITALS — BP 138/84 | HR 65 | Temp 98.2°F | Resp 16 | Ht 64.0 in | Wt 259.9 lb

## 2018-06-05 DIAGNOSIS — E1169 Type 2 diabetes mellitus with other specified complication: Secondary | ICD-10-CM

## 2018-06-05 DIAGNOSIS — E669 Obesity, unspecified: Secondary | ICD-10-CM | POA: Diagnosis not present

## 2018-06-05 DIAGNOSIS — H109 Unspecified conjunctivitis: Secondary | ICD-10-CM | POA: Diagnosis not present

## 2018-06-05 DIAGNOSIS — L03211 Cellulitis of face: Secondary | ICD-10-CM | POA: Diagnosis not present

## 2018-06-05 MED ORDER — AMOXICILLIN 875 MG PO TABS: 875.0000 mg | ORAL_TABLET | Freq: Two times a day (BID) | ORAL | 0 refills | Status: AC

## 2018-06-05 MED ORDER — SULFAMETHOXAZOLE-TRIMETHOPRIM 800-160 MG PO TABS: 1.0000 | ORAL_TABLET | Freq: Two times a day (BID) | ORAL | 0 refills | Status: AC

## 2018-06-05 MED ORDER — ERYTHROMYCIN 5 MG/GM OP OINT: 1.0000 "application " | TOPICAL_OINTMENT | Freq: Four times a day (QID) | OPHTHALMIC | 0 refills | Status: AC

## 2018-06-05 NOTE — Progress Notes (Signed)
Name: Elizabeth Russo   MRN: [id number]    DOB: [birth year]   Date:06/05/2018       Progress Note  Subjective  Chief Complaint  Chief Complaint  Patient presents with  . Conjunctivitis    I connected with  Elizabeth Russo  on 06/05/18 at  7:40 AM EDT by a video enabled telemedicine application and verified that I am speaking with the correct person using two identifiers.  I discussed the limitations of evaluation and management by telemedicine and the availability of in person appointments. The patient expressed understanding and agreed to proceed. Staff also discussed with the patient that there may be a patient responsible charge related to this service. Patient Location: Office Provider Location: Home Additional Individuals present: Hollie Salk CMA  HPI  Pt presents with concern for possible pink eye in the LEFT eye x1 day.  She notes that she has some swelling on the left side in front of her ear that started yesterday morning - there is tenderness in the preauricular area.  She is not having any eye pain, no preorbital swelling.  There is some eye dryness and burning, does have purulent drainage.  There is some blurred vision when drainage from eye occurs,  She has not had any fevers or chills.  Pt does have DM, but her A1C has been historically very well controlled over the last year.    Patient Active Problem List   Diagnosis Date Noted  . Preventative health care 01/21/2018  . Family history of colon cancer in father   . Personal history of colonic polyps   . Myalgia due to statin 04/13/2017  . Venous stasis 04/13/2017  . Vision changes 10/11/2016  . Screening mammogram, encounter for 10/11/2016  . Shortness of breath 01/05/2016  . Vertigo 10/06/2015  . Medication monitoring encounter 04/26/2015  . History of non-ST elevation myocardial infarction (NSTEMI) 02/05/2015  . Hx of percutaneous transluminal coronary angioplasty 02/05/2015  . Gout 12/25/2014  . Gout of knee  12/25/2014  . Low back pain 12/25/2014  . Night sweats 10/23/2014  . Localized enlarged lymph nodes 10/23/2014  . Knee pain, bilateral 10/23/2014  . Joint pain 10/01/2014  . Morbid obesity ([place]) 06/26/2014  . Chronic combined systolic and diastolic heart failure ([place]) 05/24/2014  . Breast mass, right 04/25/2014  . Goiter 03/19/2014  . Coronary atherosclerosis 12/19/2013  . Hyperlipidemia 12/19/2013  . Essential hypertension, benign 12/18/2013  . Diabetes mellitus type 2 in obese ([place]) 12/18/2013  . Hypothyroidism 12/18/2013  . Hearing loss 08/25/2011  . Atrial fibrillation ([place]) 02/13/2011    Social History   Tobacco Use  . Smoking status: Former Smoker    Packs/day: 0.50    Years: 30.00    Pack years: 15.00    Types: Cigarettes    Start date: 01/09/1978    Last attempt to quit: 03/09/2013    Years since quitting: 5.2  . Smokeless tobacco: Never Used  Substance Use Topics  . Alcohol use: Yes    Alcohol/week: 0.0 standard drinks    Comment: Socially      Current Outpatient Medications:  .  amLODipine-valsartan (EXFORGE) 10-160 MG tablet, Take 1 tablet by mouth daily., Disp: 90 tablet, Rfl: 1 .  aspirin EC 81 MG tablet, Take 81 mg by mouth daily. , Disp: , Rfl:  .  carvedilol (COREG) 25 MG tablet, TAKE 1 TABLET BY MOUTH TWICE DAILY WITH A MEAL, Disp: 180 tablet, Rfl: 1 .  diclofenac sodium (VOLTAREN) 1 %  GEL, Apply 2 g topically as needed. , Disp: , Rfl:  .  Evolocumab (REPATHA SURECLICK) 742 MG/ML SOAJ, Inject 1 each into the skin every 14 (fourteen) days., Disp: 2 pen, Rfl: 5 .  ezetimibe (ZETIA) 10 MG tablet, Take 1 tablet (10 mg total) by mouth daily., Disp: 30 tablet, Rfl: 11 .  fluticasone (FLONASE) 50 MCG/ACT nasal spray, Place 2 sprays into both nostrils daily. (Patient taking differently: Place 2 sprays into both nostrils as needed. ), Disp: 16 g, Rfl: 2 .  Insulin Pen Needle (BD PEN NEEDLE NANO U/F) 32G X 4 MM MISC, Use for insulin administration four times daily.,  Disp: 200 each, Rfl: 1 .  levothyroxine (SYNTHROID, LEVOTHROID) 137 MCG tablet, Take 1 tablet by mouth daily before breakfast on Tuesday, Thursday, Saturday and Sunday, Disp: 17 tablet, Rfl: 11 .  levothyroxine (SYNTHROID, LEVOTHROID) 150 MCG tablet, Take 1 tablet by mouth once daily on Monday, Wednesday and Friday and take 137 mcg on the other days., Disp: 78 tablet, Rfl: 0 .  liraglutide (VICTOZA) 18 MG/3ML SOPN, Inject 0.2 mLs (1.2 mg total) into the skin daily., Disp: 5 pen, Rfl: 11 .  meclizine (ANTIVERT) 25 MG tablet, Take 1 tablet (25 mg total) by mouth Three (3) times a day as needed for dizziness., Disp: 30 tablet, Rfl: 1 .  metFORMIN (GLUCOPHAGE-XR) 500 MG 24 hr tablet, TAKE 1 TABLET BY MOUTH ONCE DAILY WITH BREAKFAST, Disp: 30 tablet, Rfl: 5 .  ONE TOUCH ULTRA TEST test strip, Check sugar 3 times daily, Disp: 100 each, Rfl: 5 .  famotidine (PEPCID) 20 MG tablet, Take 1 tablet (20 mg total) by mouth 2 (two) times daily as needed for heartburn or indigestion. (Patient not taking: Reported on 06/05/2018), Disp: 180 tablet, Rfl: 3 .  loratadine (CLARITIN) 10 MG tablet, Take 1 tablet (10 mg total) by mouth daily. (Patient not taking: Reported on 06/05/2018), Disp: 30 tablet, Rfl: 2  Allergies  Allergen Reactions  . Atorvastatin Other (See Comments)    Other reaction(s): Muscle Pain  . Hydralazine Other (See Comments)    Aggravated gout Aggravated gout  . Hydrochlorothiazide Other (See Comments)    Aggravated gout  . Lisinopril Cough  . Pravastatin Other (See Comments)    Other reaction(s): Muscle Pain  . Rosuvastatin Other (See Comments)    Other reaction(s): Muscle Pain    I personally reviewed active problem list, medication list, allergies, notes from last encounter, lab results with the patient/caregiver today.  ROS  Ten systems reviewed and is negative except as mentioned in HPI  Objective  Today's Vitals   06/05/18 0749 06/05/18 0750  BP: 138/84   Pulse: 65   Resp: 16    Temp: 98.2 F (36.8 C)   TempSrc: Oral   SpO2: 96%   Weight: 259 lb 14.4 oz (117.9 kg)   Height: 5\' 4"  (1.626 m)   PainSc:  1    Body mass index is 44.61 kg/m.   Body mass index is 44.61 kg/m.  Nursing Note and Vital Signs reviewed.  Physical Exam  Constitutional: Patient appears well-developed and well-nourished. No distress.  HENT: Head: Normocephalic and atraumatic.  Neck: Normal range of motion. Jaw with normal AROM, there is no visible edema or erythema to the eye, orbits, eyelids, though the examination is somewhat limited due not being in person. LEFT conjunctiva is injected with some dried purulent exudate.  The LEFT jaw/preauricular area does not exhibit visible erythema or edema, but she does endorse some tenderness  on palpation.  There is no mastoid swelling or tenderness. EOM's are intact without pain on motion. Pulmonary/Chest: Effort normal. No respiratory distress. Speaking in complete sentences Neurological: Pt is alert and oriented to person, place, and time. Coordination, speech and gait are normal.  Psychiatric: Patient has a normal mood and affect. behavior is normal. Judgment and thought content normal.  No results found for this or any previous visit (from the past 72 hour(s)).  Assessment & Plan  1. Facial cellulitis - Red flags discussed in detail as below, will follow up tomorrow morning in our office, but if anything worsens she is encouraged to seek ER treatment. - sulfamethoxazole-trimethoprim (BACTRIM DS) 800-160 MG tablet; Take 1 tablet by mouth 2 (two) times daily for 7 days.  Dispense: 14 tablet; Refill: 0 - amoxicillin (AMOXIL) 875 MG tablet; Take 1 tablet (875 mg total) by mouth 2 (two) times daily for 7 days.  Dispense: 14 tablet; Refill: 0  2. Diabetes mellitus type 2 in obese (HCC) - In an abundance of caution due to her history of diabetes, we will treat for both facial cellulitis and bacterial conjunctivitis as DM does put her at increased  risk for complex infections.  She verbalizes understanding.  3. Bacterial conjunctivitis of left eye - erythromycin ophthalmic ointment; Place 1 application into the left eye 4 (four) times daily for 7 days.  Dispense: 3.5 g; Refill: 0  -Red flags and when to present for emergency care or RTC including fever >101.26F, chest pain, shortness of breath, new/worsening/un-resolving symptoms, pain with ocular movement, eye pain, vision changes, swelling around the eye, worsening preauricular tenderness or swelling reviewed with patient at time of visit. Follow up and care instructions discussed and provided in AVS. - I discussed the assessment and treatment plan with the patient. The patient was provided an opportunity to ask questions and all were answered. The patient agreed with the plan and demonstrated an understanding of the instructions.  I provided 22 minutes of non-face-to-face time during this encounter.  Hubbard Hartshorn, FNP

## 2018-06-06 ENCOUNTER — Ambulatory Visit: Payer: BC Managed Care – PPO | Admitting: Family Medicine

## 2018-06-06 ENCOUNTER — Encounter: Payer: Self-pay | Admitting: Family Medicine

## 2018-06-06 ENCOUNTER — Other Ambulatory Visit: Payer: Self-pay

## 2018-06-06 VITALS — BP 128/84 | HR 62 | Temp 98.2°F | Resp 16 | Ht 64.0 in | Wt 256.2 lb

## 2018-06-06 DIAGNOSIS — H109 Unspecified conjunctivitis: Secondary | ICD-10-CM | POA: Diagnosis not present

## 2018-06-06 DIAGNOSIS — L03211 Cellulitis of face: Secondary | ICD-10-CM

## 2018-06-06 NOTE — Progress Notes (Signed)
Name: Elizabeth Russo   MRN: [id number]    DOB: [birth year]   Date:06/06/2018       Progress Note  Subjective  Chief Complaint  Chief Complaint  Patient presents with  . Follow-up    1 day recheck eyes    HPI  Pt presents for 1 day follow up on facial cellulitis and LEFT bacterial conjunctivitis.  PT states she is feeling a bit better today compared to yesterday, started antibiotics for facial cellulitis and ointment for conjunctivitis.  She denies fevers/chills overnight, no increased swelling, no vision changes.  Patient Active Problem List   Diagnosis Date Noted  . Preventative health care 01/21/2018  . Family history of colon cancer in father   . Personal history of colonic polyps   . Myalgia due to statin 04/13/2017  . Venous stasis 04/13/2017  . Vision changes 10/11/2016  . Screening mammogram, encounter for 10/11/2016  . Shortness of breath 01/05/2016  . Vertigo 10/06/2015  . Medication monitoring encounter 04/26/2015  . History of non-ST elevation myocardial infarction (NSTEMI) 02/05/2015  . Hx of percutaneous transluminal coronary angioplasty 02/05/2015  . Gout 12/25/2014  . Gout of knee 12/25/2014  . Low back pain 12/25/2014  . Night sweats 10/23/2014  . Localized enlarged lymph nodes 10/23/2014  . Knee pain, bilateral 10/23/2014  . Joint pain 10/01/2014  . Morbid obesity ([place]) 06/26/2014  . Chronic combined systolic and diastolic heart failure ([place]) 05/24/2014  . Breast mass, right 04/25/2014  . Goiter 03/19/2014  . Coronary atherosclerosis 12/19/2013  . Hyperlipidemia 12/19/2013  . Essential hypertension, benign 12/18/2013  . Diabetes mellitus type 2 in obese ([place]) 12/18/2013  . Hypothyroidism 12/18/2013  . Hearing loss 08/25/2011  . Atrial fibrillation ([place]) 02/13/2011    Social History   Tobacco Use  . Smoking status: Former Smoker    Packs/day: 0.50    Years: 30.00    Pack years: 15.00    Types: Cigarettes    Start date: 01/09/1978    Last  attempt to quit: 03/09/2013    Years since quitting: 5.2  . Smokeless tobacco: Never Used  Substance Use Topics  . Alcohol use: Yes    Alcohol/week: 0.0 standard drinks    Comment: Socially      Current Outpatient Medications:  .  amLODipine-valsartan (EXFORGE) 10-160 MG tablet, Take 1 tablet by mouth daily., Disp: 90 tablet, Rfl: 1 .  amoxicillin (AMOXIL) 875 MG tablet, Take 1 tablet (875 mg total) by mouth 2 (two) times daily for 7 days., Disp: 14 tablet, Rfl: 0 .  aspirin EC 81 MG tablet, Take 81 mg by mouth daily. , Disp: , Rfl:  .  carvedilol (COREG) 25 MG tablet, TAKE 1 TABLET BY MOUTH TWICE DAILY WITH A MEAL, Disp: 180 tablet, Rfl: 1 .  diclofenac sodium (VOLTAREN) 1 % GEL, Apply 2 g topically as needed. , Disp: , Rfl:  .  erythromycin ophthalmic ointment, Place 1 application into the left eye 4 (four) times daily for 7 days., Disp: 3.5 g, Rfl: 0 .  Evolocumab (REPATHA SURECLICK) 101 MG/ML SOAJ, Inject 1 each into the skin every 14 (fourteen) days., Disp: 2 pen, Rfl: 5 .  ezetimibe (ZETIA) 10 MG tablet, Take 1 tablet (10 mg total) by mouth daily., Disp: 30 tablet, Rfl: 11 .  fluticasone (FLONASE) 50 MCG/ACT nasal spray, Place 2 sprays into both nostrils daily. (Patient taking differently: Place 2 sprays into both nostrils as needed. ), Disp: 16 g, Rfl: 2 .  Insulin Pen  Needle (BD PEN NEEDLE NANO U/F) 32G X 4 MM MISC, Use for insulin administration four times daily., Disp: 200 each, Rfl: 1 .  levothyroxine (SYNTHROID, LEVOTHROID) 137 MCG tablet, Take 1 tablet by mouth daily before breakfast on Tuesday, Thursday, Saturday and Sunday, Disp: 17 tablet, Rfl: 11 .  levothyroxine (SYNTHROID, LEVOTHROID) 150 MCG tablet, Take 1 tablet by mouth once daily on Monday, Wednesday and Friday and take 137 mcg on the other days., Disp: 78 tablet, Rfl: 0 .  liraglutide (VICTOZA) 18 MG/3ML SOPN, Inject 0.2 mLs (1.2 mg total) into the skin daily., Disp: 5 pen, Rfl: 11 .  meclizine (ANTIVERT) 25 MG tablet,  Take 1 tablet (25 mg total) by mouth Three (3) times a day as needed for dizziness., Disp: 30 tablet, Rfl: 1 .  metFORMIN (GLUCOPHAGE-XR) 500 MG 24 hr tablet, TAKE 1 TABLET BY MOUTH ONCE DAILY WITH BREAKFAST, Disp: 30 tablet, Rfl: 5 .  ONE TOUCH ULTRA TEST test strip, Check sugar 3 times daily, Disp: 100 each, Rfl: 5 .  sulfamethoxazole-trimethoprim (BACTRIM DS) 800-160 MG tablet, Take 1 tablet by mouth 2 (two) times daily for 7 days., Disp: 14 tablet, Rfl: 0 .  famotidine (PEPCID) 20 MG tablet, Take 1 tablet (20 mg total) by mouth 2 (two) times daily as needed for heartburn or indigestion. (Patient not taking: Reported on 06/05/2018), Disp: 180 tablet, Rfl: 3 .  loratadine (CLARITIN) 10 MG tablet, Take 1 tablet (10 mg total) by mouth daily. (Patient not taking: Reported on 06/05/2018), Disp: 30 tablet, Rfl: 2  Allergies  Allergen Reactions  . Atorvastatin Other (See Comments)    Other reaction(s): Muscle Pain  . Hydralazine Other (See Comments)    Aggravated gout Aggravated gout  . Hydrochlorothiazide Other (See Comments)    Aggravated gout  . Lisinopril Cough  . Pravastatin Other (See Comments)    Other reaction(s): Muscle Pain  . Rosuvastatin Other (See Comments)    Other reaction(s): Muscle Pain    I personally reviewed active problem list, medication list, allergies, notes from last encounter, lab results with the patient/caregiver today.  ROS  Constitutional: Negative for fever or weight change.  Respiratory: Negative for cough and shortness of breath.   Cardiovascular: Negative for chest pain or palpitations.  Gastrointestinal: Negative for abdominal pain, no bowel changes.  Musculoskeletal: Negative for gait problem or joint swelling.  Skin: Negative for rash.  Neurological: Negative for dizziness or headache.  No other specific complaints in a complete review of systems (except as listed in HPI above).   Objective  Vitals:   06/06/18 0753  BP: 128/84  Pulse: 62   Resp: 16  Temp: 98.2 F (36.8 C)  TempSrc: Oral  SpO2: 98%  Weight: 256 lb 3.2 oz (116.2 kg)  Height: 5\' 4"  (1.626 m)   Body mass index is 43.98 kg/m.  Nursing Note and Vital Signs reviewed.  Physical Exam  Constitutional: Patient appears well-developed and well-nourished. Obese. No distress.  HEENT: head atraumatic, normocephalic, pupils equal and reactive to light, There is mild tenderness along the maxillary and frontal sinus on the LEFt side only, there is also some mild tenderness and heat in the preauricular area with no palpable lymphadenopathy or visible erythema, neck supple without lymphadenopathy. Cardiovascular: Normal rate, regular rhythm and normal heart sounds.  No murmur heard. Pulmonary/Chest: Effort normal and breath sounds clear bilaterally. No respiratory distress. Abdominal: Soft, bowel sounds normal, there is no tenderness, no HSM Psychiatric: Patient has a normal mood and affect. behavior is  normal. Judgment and thought content normal.  No results found for this or any previous visit (from the past 72 hour(s)).  Assessment & Plan  1. Facial cellulitis - Appears improved from yesterday, will follow for 1 additional day prior to the weekend to ensure ongoing resolution.  2. Bacterial conjunctivitis of left eye - Appears to be improving.  -Red flags and when to present for emergency care or RTC including fever >101.60F, chest pain, shortness of breath, new/worsening/un-resolving symptoms, reviewed with patient at time of visit. Follow up and care instructions discussed and provided in AVS.

## 2018-06-07 ENCOUNTER — Encounter: Payer: Self-pay | Admitting: Emergency Medicine

## 2018-06-07 ENCOUNTER — Encounter: Payer: Self-pay | Admitting: Family Medicine

## 2018-06-07 ENCOUNTER — Other Ambulatory Visit: Payer: Self-pay

## 2018-06-07 ENCOUNTER — Ambulatory Visit: Payer: BC Managed Care – PPO | Admitting: Family Medicine

## 2018-06-07 VITALS — BP 124/70 | HR 61 | Temp 98.3°F | Resp 16 | Ht 64.0 in | Wt 258.6 lb

## 2018-06-07 DIAGNOSIS — H109 Unspecified conjunctivitis: Secondary | ICD-10-CM | POA: Diagnosis not present

## 2018-06-07 DIAGNOSIS — L03211 Cellulitis of face: Secondary | ICD-10-CM

## 2018-06-07 NOTE — Progress Notes (Signed)
Name: Elizabeth Russo   MRN: [id number]    DOB: [birth year]   Date:06/07/2018       Progress Note  Subjective  Chief Complaint  Chief Complaint  Patient presents with  . Follow-up    1 day recheck    HPI  Pt presents for 1 day follow up on facial cellulitis and LEFT bacterial conjunctivitis.  PT states she is feeling a lot better today compared to yesterday, started antibiotics for facial cellulitis and ointment for conjunctivitis 2 days prior.  She denies fevers/chills overnight, no increased swelling, no vision changes.    Patient Active Problem List   Diagnosis Date Noted  . Preventative health care 01/21/2018  . Family history of colon cancer in father   . Personal history of colonic polyps   . Myalgia due to statin 04/13/2017  . Venous stasis 04/13/2017  . Vision changes 10/11/2016  . Screening mammogram, encounter for 10/11/2016  . Shortness of breath 01/05/2016  . Vertigo 10/06/2015  . Medication monitoring encounter 04/26/2015  . History of non-ST elevation myocardial infarction (NSTEMI) 02/05/2015  . Hx of percutaneous transluminal coronary angioplasty 02/05/2015  . Gout 12/25/2014  . Gout of knee 12/25/2014  . Low back pain 12/25/2014  . Night sweats 10/23/2014  . Localized enlarged lymph nodes 10/23/2014  . Knee pain, bilateral 10/23/2014  . Joint pain 10/01/2014  . Morbid obesity ([place]) 06/26/2014  . Chronic combined systolic and diastolic heart failure ([place]) 05/24/2014  . Breast mass, right 04/25/2014  . Goiter 03/19/2014  . Coronary atherosclerosis 12/19/2013  . Hyperlipidemia 12/19/2013  . Essential hypertension, benign 12/18/2013  . Diabetes mellitus type 2 in obese ([place]) 12/18/2013  . Hypothyroidism 12/18/2013  . Hearing loss 08/25/2011  . Atrial fibrillation ([place]) 02/13/2011    Social History   Tobacco Use  . Smoking status: Former Smoker    Packs/day: 0.50    Years: 30.00    Pack years: 15.00    Types: Cigarettes    Start date: 01/09/1978     Last attempt to quit: 03/09/2013    Years since quitting: 5.2  . Smokeless tobacco: Never Used  Substance Use Topics  . Alcohol use: Yes    Alcohol/week: 0.0 standard drinks    Comment: Socially      Current Outpatient Medications:  .  amLODipine-valsartan (EXFORGE) 10-160 MG tablet, Take 1 tablet by mouth daily., Disp: 90 tablet, Rfl: 1 .  amoxicillin (AMOXIL) 875 MG tablet, Take 1 tablet (875 mg total) by mouth 2 (two) times daily for 7 days., Disp: 14 tablet, Rfl: 0 .  aspirin EC 81 MG tablet, Take 81 mg by mouth daily. , Disp: , Rfl:  .  carvedilol (COREG) 25 MG tablet, TAKE 1 TABLET BY MOUTH TWICE DAILY WITH A MEAL, Disp: 180 tablet, Rfl: 1 .  diclofenac sodium (VOLTAREN) 1 % GEL, Apply 2 g topically as needed. , Disp: , Rfl:  .  erythromycin ophthalmic ointment, Place 1 application into the left eye 4 (four) times daily for 7 days., Disp: 3.5 g, Rfl: 0 .  Evolocumab (REPATHA SURECLICK) 829 MG/ML SOAJ, Inject 1 each into the skin every 14 (fourteen) days., Disp: 2 pen, Rfl: 5 .  ezetimibe (ZETIA) 10 MG tablet, Take 1 tablet (10 mg total) by mouth daily., Disp: 30 tablet, Rfl: 11 .  fluticasone (FLONASE) 50 MCG/ACT nasal spray, Place 2 sprays into both nostrils daily. (Patient taking differently: Place 2 sprays into both nostrils as needed. ), Disp: 16 g, Rfl: 2 .  Insulin Pen Needle (BD PEN NEEDLE NANO U/F) 32G X 4 MM MISC, Use for insulin administration four times daily., Disp: 200 each, Rfl: 1 .  levothyroxine (SYNTHROID, LEVOTHROID) 137 MCG tablet, Take 1 tablet by mouth daily before breakfast on Tuesday, Thursday, Saturday and Sunday, Disp: 17 tablet, Rfl: 11 .  levothyroxine (SYNTHROID, LEVOTHROID) 150 MCG tablet, Take 1 tablet by mouth once daily on Monday, Wednesday and Friday and take 137 mcg on the other days., Disp: 78 tablet, Rfl: 0 .  liraglutide (VICTOZA) 18 MG/3ML SOPN, Inject 0.2 mLs (1.2 mg total) into the skin daily., Disp: 5 pen, Rfl: 11 .  meclizine (ANTIVERT) 25 MG  tablet, Take 1 tablet (25 mg total) by mouth Three (3) times a day as needed for dizziness., Disp: 30 tablet, Rfl: 1 .  metFORMIN (GLUCOPHAGE-XR) 500 MG 24 hr tablet, TAKE 1 TABLET BY MOUTH ONCE DAILY WITH BREAKFAST, Disp: 30 tablet, Rfl: 5 .  ONE TOUCH ULTRA TEST test strip, Check sugar 3 times daily, Disp: 100 each, Rfl: 5 .  sulfamethoxazole-trimethoprim (BACTRIM DS) 800-160 MG tablet, Take 1 tablet by mouth 2 (two) times daily for 7 days., Disp: 14 tablet, Rfl: 0  Allergies  Allergen Reactions  . Atorvastatin Other (See Comments)    Other reaction(s): Muscle Pain  . Hydralazine Other (See Comments)    Aggravated gout Aggravated gout  . Hydrochlorothiazide Other (See Comments)    Aggravated gout  . Lisinopril Cough  . Pravastatin Other (See Comments)    Other reaction(s): Muscle Pain  . Rosuvastatin Other (See Comments)    Other reaction(s): Muscle Pain    I personally reviewed active problem list, medication list, allergies with the patient/caregiver today.  ROS  Constitutional: Negative for fever or weight change.  Respiratory: Negative for cough and shortness of breath.   Cardiovascular: Negative for chest pain or palpitations.  Gastrointestinal: Negative for abdominal pain, no bowel changes.  Musculoskeletal: Negative for gait problem or joint swelling.  Skin: Negative for rash.  Neurological: Negative for dizziness or headache.  No other specific complaints in a complete review of systems (except as listed in HPI above).   Objective  Vitals:   06/07/18 0725  BP: 124/70  Pulse: 61  Resp: 16  Temp: 98.3 F (36.8 C)  TempSrc: Oral  SpO2: 94%  Weight: 258 lb 9.6 oz (117.3 kg)  Height: 5\' 4"  (1.626 m)    Body mass index is 44.39 kg/m.  Nursing Note and Vital Signs reviewed.  Physical Exam   Constitutional: Patient appears well-developed and well-nourished. Obese. No distress.  HEENT: head atraumatic, normocephalic, pupils equal and reactive to light,  There is mild tenderness along the maxillary sinus on the LEFT side only, there is also some very minimal tenderness in the preauricular area with no palpable lymphadenopathy or visible erythema, neck supple without lymphadenopathy. The frontal sinus tenderness has resolved, there is no longer heat/erythema palpable. EOM's remain intact. Cardiovascular: Normal rate, regular rhythm and normal heart sounds.  No murmur heard. Pulmonary/Chest: Effort normal and breath sounds clear bilaterally. No respiratory distress. Abdominal: Soft, bowel sounds normal, there is no tenderness, no HSM Psychiatric: Patient has a normal mood and affect. behavior is normal. Judgment and thought content normal.  No results found for this or any previous visit (from the past 72 hour(s)).  Assessment & Plan  1. Facial cellulitis 2. Bacterial conjunctivitis of left eye - Marked improvement over the last 2 days, will follow up only as needed at this point, and  will complete antibiotics as prescribed.  Reviewed red flags in detail including vision changes, ocular swelling, pain with ocular movement, mastoid swelling.  -Red flags and when to present for emergency care or RTC including fever >101.66F, chest pain, shortness of breath, new/worsening/un-resolving symptoms, reviewed with patient at time of visit. Follow up and care instructions discussed and provided in AVS.

## 2018-07-01 ENCOUNTER — Ambulatory Visit (INDEPENDENT_AMBULATORY_CARE_PROVIDER_SITE_OTHER): Payer: BC Managed Care – PPO | Admitting: Family Medicine

## 2018-07-01 ENCOUNTER — Encounter: Payer: Self-pay | Admitting: Family Medicine

## 2018-07-01 ENCOUNTER — Other Ambulatory Visit: Payer: Self-pay

## 2018-07-01 DIAGNOSIS — H109 Unspecified conjunctivitis: Secondary | ICD-10-CM

## 2018-07-01 MED ORDER — AZITHROMYCIN 1 % OP SOLN: OPHTHALMIC | 0 refills | Status: DC

## 2018-07-01 NOTE — Progress Notes (Signed)
Name: Elizabeth Russo   MRN: [id number]    DOB: 05/26/1960   Date:07/01/2018       Progress Note  Subjective  Chief Complaint  Chief Complaint  Patient presents with  . Conjunctivitis    left, onset this am, redness, matted, feels like something is in it    I connected with  [name]  on 07/01/18 at  9:40 AM EDT by a video enabled telemedicine application and verified that I am speaking with the correct person using two identifiers.  I discussed the limitations of evaluation and management by telemedicine and the availability of in person appointments. The patient expressed understanding and agreed to proceed. Staff also discussed with the patient that there may be a patient responsible charge related to this service. Patient Location: Home Provider Location: Home Additional Individuals present: None  HPI  Pt presents with concern for LEFT eye conjunctivitis.  She was seen 06/06/2018 and 06/07/2018 for bacterial conjunctivitis and some mild cellutlitis of the surrounding area.  She completely healed from this and then yesterday she started having symptoms of conjunctivitis again.  She has a sensation of something in her eye, having some mattering of the lashes with small amount of purulent drainage. No itching, no pain, no swelling, no vision changes.  RIGHT eye is unaffected.  DM: A1C has been very stable and below goal over the last year. Discussed interplay of DM and slow to heal/recurrent infections.  BG's have been in the 100-120's range at home.   Patient Active Problem List   Diagnosis Date Noted  . Preventative health care 01/21/2018  . Family history of colon cancer in father   . Personal history of colonic polyps   . Myalgia due to statin 04/13/2017  . Venous stasis 04/13/2017  . Vision changes 10/11/2016  . Screening mammogram, encounter for 10/11/2016  . Shortness of breath 01/05/2016  . Vertigo 10/06/2015  . Medication monitoring encounter 04/26/2015  .  History of non-ST elevation myocardial infarction (NSTEMI) 02/05/2015  . Hx of percutaneous transluminal coronary angioplasty 02/05/2015  . Gout 12/25/2014  . Gout of knee 12/25/2014  . Low back pain 12/25/2014  . Night sweats 10/23/2014  . Localized enlarged lymph nodes 10/23/2014  . Knee pain, bilateral 10/23/2014  . Joint pain 10/01/2014  . Morbid obesity ([place]) 06/26/2014  . Chronic combined systolic and diastolic heart failure ([place]) 05/24/2014  . Breast mass, right 04/25/2014  . Goiter 03/19/2014  . Coronary atherosclerosis 12/19/2013  . Hyperlipidemia 12/19/2013  . Essential hypertension, benign 12/18/2013  . Diabetes mellitus type 2 in obese ([place]) 12/18/2013  . Hypothyroidism 12/18/2013  . Hearing loss 08/25/2011  . Atrial fibrillation ([place]) 02/13/2011    Social History   Tobacco Use  . Smoking status: Former Smoker    Packs/day: 0.50    Years: 30.00    Pack years: 15.00    Types: Cigarettes    Start date: 01/09/1978    Quit date: 03/09/2013    Years since quitting: 5.3  . Smokeless tobacco: Never Used  Substance Use Topics  . Alcohol use: Yes    Alcohol/week: 0.0 standard drinks    Comment: Socially      Current Outpatient Medications:  .  amLODipine-valsartan (EXFORGE) 10-160 MG tablet, Take 1 tablet by mouth daily., Disp: 90 tablet, Rfl: 1 .  aspirin EC 81 MG tablet, Take 81 mg by mouth daily. , Disp: , Rfl:  .  carvedilol (COREG) 25 MG tablet, TAKE 1 TABLET BY MOUTH  TWICE DAILY WITH A MEAL, Disp: 180 tablet, Rfl: 1 .  Evolocumab (REPATHA SURECLICK) 809 MG/ML SOAJ, Inject 1 each into the skin every 14 (fourteen) days., Disp: 2 pen, Rfl: 5 .  ezetimibe (ZETIA) 10 MG tablet, Take 1 tablet (10 mg total) by mouth daily., Disp: 30 tablet, Rfl: 11 .  fluticasone (FLONASE) 50 MCG/ACT nasal spray, Place 2 sprays into both nostrils daily. (Patient taking differently: Place 2 sprays into both nostrils as needed. ), Disp: 16 g, Rfl: 2 .  levothyroxine (SYNTHROID, LEVOTHROID)  137 MCG tablet, Take 1 tablet by mouth daily before breakfast on Tuesday, Thursday, Saturday and Sunday, Disp: 17 tablet, Rfl: 11 .  levothyroxine (SYNTHROID, LEVOTHROID) 150 MCG tablet, Take 1 tablet by mouth once daily on Monday, Wednesday and Friday and take 137 mcg on the other days., Disp: 78 tablet, Rfl: 0 .  liraglutide (VICTOZA) 18 MG/3ML SOPN, Inject 0.2 mLs (1.2 mg total) into the skin daily., Disp: 5 pen, Rfl: 11 .  meclizine (ANTIVERT) 25 MG tablet, Take 1 tablet (25 mg total) by mouth Three (3) times a day as needed for dizziness., Disp: 30 tablet, Rfl: 1 .  metFORMIN (GLUCOPHAGE-XR) 500 MG 24 hr tablet, TAKE 1 TABLET BY MOUTH ONCE DAILY WITH BREAKFAST, Disp: 30 tablet, Rfl: 5 .  diclofenac sodium (VOLTAREN) 1 % GEL, Apply 2 g topically as needed. , Disp: , Rfl:  .  Insulin Pen Needle (BD PEN NEEDLE NANO U/F) 32G X 4 MM MISC, Use for insulin administration four times daily., Disp: 200 each, Rfl: 1 .  ONE TOUCH ULTRA TEST test strip, Check sugar 3 times daily, Disp: 100 each, Rfl: 5  Allergies  Allergen Reactions  . Atorvastatin Other (See Comments)    Other reaction(s): Muscle Pain  . Hydralazine Other (See Comments)    Aggravated gout Aggravated gout  . Hydrochlorothiazide Other (See Comments)    Aggravated gout  . Lisinopril Cough  . Pravastatin Other (See Comments)    Other reaction(s): Muscle Pain  . Rosuvastatin Other (See Comments)    Other reaction(s): Muscle Pain    I personally reviewed active problem list, medication list, allergies, notes from last encounter with the patient/caregiver today.  ROS  Constitutional: Negative for fever or weight change.  Respiratory: Negative for cough and shortness of breath.   Cardiovascular: Negative for chest pain or palpitations.  Gastrointestinal: Negative for abdominal pain, no bowel changes.  Musculoskeletal: Negative for gait problem or joint swelling.  Skin: Negative for rash.  Neurological: Negative for dizziness or  headache.  No other specific complaints in a complete review of systems (except as listed in HPI above).  Objective  Virtual encounter, vitals not obtained.  There is no height or weight on file to calculate BMI.  Nursing Note and Vital Signs reviewed.  Physical Exam  Constitutional: Patient appears well-developed and well-nourished. No distress.  HENT: Head: Normocephalic and atraumatic. Left conjunctiva is mildly injected, RIGHT conjunctiva is normal. Neck: Normal range of motion. Pulmonary/Chest: Effort normal. No respiratory distress. Speaking in complete sentences Neurological: Pt is alert and oriented to person, place, and time. Coordination, speech and gait are normal.  Psychiatric: Patient has a normal mood and affect. behavior is normal. Judgment and thought content normal.  No results found for this or any previous visit (from the past 72 hour(s)).  Assessment & Plan  1. Bacterial conjunctivitis of left eye - Due to the recurrent nature of this infection, we will change antibiotic and recommend referral to ophthalmology  for evaluation. - azithromycin (AZASITE) 1 % ophthalmic solution; Apply 1 drop in LEFT eye twice for one day, then once daily for 4 days. (5 days total)  Dispense: 2.5 mL; Refill: 0 - Ambulatory referral to Ophthalmology  -Red flags and when to present for emergency care or RTC including fever >101.38F, chest pain, shortness of breath, new/worsening/un-resolving symptoms, reviewed with patient at time of visit. Follow up and care instructions discussed and provided in AVS. - I discussed the assessment and treatment plan with the patient. The patient was provided an opportunity to ask questions and all were answered. The patient agreed with the plan and demonstrated an understanding of the instructions.  I provided 15 minutes of non-face-to-face time during this encounter.  Hubbard Hartshorn, FNP

## 2018-07-03 ENCOUNTER — Telehealth: Payer: Self-pay | Admitting: Family Medicine

## 2018-07-03 ENCOUNTER — Encounter: Payer: Self-pay | Admitting: Family Medicine

## 2018-07-03 ENCOUNTER — Other Ambulatory Visit: Payer: Self-pay

## 2018-07-03 NOTE — Telephone Encounter (Signed)
Mail box is full can not leave message to schedule appt per doctor

## 2018-07-03 NOTE — Telephone Encounter (Signed)
Medication Refill - Medication: azithromycin (AZASITE) 1 % ophthalmic solution (Patient needs medication sent to a different pharmacy.)   Has the patient contacted their pharmacy? Yes (Agent: If no, request that the patient contact the pharmacy for the refill.) (Agent: If yes, when and what did the pharmacy advise?)Contact PCP  Preferred Pharmacy (with phone number or street name):  [facility], [place] [phone] (Phone) [phone] (Fax)     Agent: Please be advised that RX refills may take up to 3 business days. We ask that you follow-up with your pharmacy.

## 2018-07-22 ENCOUNTER — Ambulatory Visit: Payer: BC Managed Care – PPO | Admitting: Family Medicine

## 2018-07-23 ENCOUNTER — Encounter: Payer: Self-pay | Admitting: Nurse Practitioner

## 2018-07-23 ENCOUNTER — Other Ambulatory Visit: Payer: Self-pay

## 2018-07-23 ENCOUNTER — Ambulatory Visit: Payer: BC Managed Care – PPO | Admitting: Nurse Practitioner

## 2018-07-23 VITALS — BP 108/62 | HR 64 | Temp 96.9°F | Resp 14 | Ht 64.0 in | Wt 261.7 lb

## 2018-07-23 DIAGNOSIS — E1169 Type 2 diabetes mellitus with other specified complication: Secondary | ICD-10-CM

## 2018-07-23 DIAGNOSIS — I480 Paroxysmal atrial fibrillation: Secondary | ICD-10-CM | POA: Diagnosis not present

## 2018-07-23 DIAGNOSIS — E039 Hypothyroidism, unspecified: Secondary | ICD-10-CM

## 2018-07-23 DIAGNOSIS — I5042 Chronic combined systolic (congestive) and diastolic (congestive) heart failure: Secondary | ICD-10-CM

## 2018-07-23 DIAGNOSIS — I10 Essential (primary) hypertension: Secondary | ICD-10-CM | POA: Diagnosis not present

## 2018-07-23 DIAGNOSIS — E782 Mixed hyperlipidemia: Secondary | ICD-10-CM

## 2018-07-23 DIAGNOSIS — E669 Obesity, unspecified: Secondary | ICD-10-CM

## 2018-07-23 MED ORDER — CARVEDILOL 25 MG PO TABS: 12.5000 mg | ORAL_TABLET | Freq: Two times a day (BID) | ORAL | 1 refills | Status: DC

## 2018-07-23 NOTE — Progress Notes (Signed)
Name: Elizabeth Russo   MRN: [id number]    DOB: Sep 21, 1960   Date:07/23/2018       Progress Note  Subjective  Chief Complaint  Chief Complaint  Patient presents with  . Follow-up    HPI  Hypertension Patient is on amlodipine 10mg  and valsartan 160mg  daily, coreg 25 mg BID.  Takes medications as prescribed with no missed doses a month.  She is relatively compliant with low-salt diet- doesn't add salt but might eat some salty foods.  Denies chest pain, headaches, endorses some blurry vision- seeing eye doctor.  Endorses some lightheadedness with position changes.  BP Readings from Last 3 Encounters:  07/23/18 108/62  06/07/18 124/70  06/06/18 128/84   Hyperlipidemia Patient rx zetia and repatha, has statin intolerance Takes medications as prescribed with no missed doses a month.  Diet: eats vegetables a few times a week, eats red meats maybe 3-5 times a week. Eats mostly chicken.  Denies myalgias Lab Results  Component Value Date   CHOL 114 01/21/2018   HDL 42 (L) 01/21/2018   LDLCALC 56 01/21/2018   TRIG 82 01/21/2018   CHOLHDL 2.7 01/21/2018   Diabetes Mellitus Patient is rx metformin 500mg  daily, victoza 1.2mg  daily.  Takes medications as prescribed with no missed doses a month.  Denies polyphagia, polydipsia, polyuria.  Fasting blood sugars are between 98-128 Lab Results  Component Value Date   HGBA1C 5.9 (A) 04/22/2018   HGBA1C 5.9 04/22/2018   HGBA1C 5.9 04/22/2018   HGBA1C 5.9 04/22/2018    Hypothyroidism Patient rx alternating days of levothryoxine 155mcg and 152mcg daily. Has been on this dose for a few years. Patient endorses some fatigue and occasional heart "jump"- was checked out but hasnt seen heart doctor in years not concerned, typically happens after taking one of her medications- doesn't recall which one. denies palpitations, insomnia, constipation/diarrhea, hot/cold intolerances,   Lab Results  Component Value Date   TSH 1.31 01/21/2018      PHQ2/9: Depression screen [facility] 2/9 07/23/2018 07/01/2018 06/07/2018 06/06/2018 06/05/2018  Decreased Interest 0 0 0 0 0  Down, Depressed, Hopeless 0 0 0 0 0  PHQ - 2 Score 0 0 0 0 0  Altered sleeping 0 0 0 0 0  Tired, decreased energy 0 0 0 0 0  Change in appetite 0 0 0 0 0  Feeling bad or failure about yourself  0 0 0 0 0  Trouble concentrating 0 0 0 0 0  Moving slowly or fidgety/restless 0 0 0 0 0  Suicidal thoughts 0 0 0 0 0  PHQ-9 Score 0 0 0 0 0  Difficult doing work/chores Not difficult at all Not difficult at all Not difficult at all Not difficult at all Not difficult at all  Some recent data might be hidden     PHQ reviewed. Negative  Patient Active Problem List   Diagnosis Date Noted  . Preventative health care 01/21/2018  . Family history of colon cancer in father   . Personal history of colonic polyps   . Myalgia due to statin 04/13/2017  . Venous stasis 04/13/2017  . Vision changes 10/11/2016  . Screening mammogram, encounter for 10/11/2016  . Shortness of breath 01/05/2016  . Vertigo 10/06/2015  . Medication monitoring encounter 04/26/2015  . History of non-ST elevation myocardial infarction (NSTEMI) 02/05/2015  . Hx of percutaneous transluminal coronary angioplasty 02/05/2015  . Gout 12/25/2014  . Gout of knee 12/25/2014  . Low back pain 12/25/2014  . Night sweats  10/23/2014  . Localized enlarged lymph nodes 10/23/2014  . Knee pain, bilateral 10/23/2014  . Joint pain 10/01/2014  . Morbid obesity ([place]) 06/26/2014  . Chronic combined systolic and diastolic heart failure ([place]) 05/24/2014  . Breast mass, right 04/25/2014  . Goiter 03/19/2014  . Coronary atherosclerosis 12/19/2013  . Hyperlipidemia 12/19/2013  . Essential hypertension, benign 12/18/2013  . Diabetes mellitus type 2 in obese ([place]) 12/18/2013  . Hypothyroidism 12/18/2013  . Hearing loss 08/25/2011  . Atrial fibrillation ([place]) 02/13/2011    Past Medical History:  Diagnosis Date  . CAD  (coronary artery disease)   . Diabetes mellitus without complication ([place])   . Diverticulosis   . Hypertension   . Hypothyroid   . Morbid obesity ([place]) 06/26/2014  . Myalgia due to statin 04/13/2017   Intolerant to three different statins  . Vertigo     Past Surgical History:  Procedure Laterality Date  . ABDOMINAL HYSTERECTOMY    . COLONOSCOPY WITH PROPOFOL N/A 08/13/2017   Procedure: COLONOSCOPY WITH PROPOFOL;  Surgeon: Lin Landsman, MD;  Location: [facility] ENDOSCOPY;  Service: Gastroenterology;  Laterality: N/A;  . heart stent  2012    Social History   Tobacco Use  . Smoking status: Former Smoker    Packs/day: 0.50    Years: 30.00    Pack years: 15.00    Types: Cigarettes    Start date: 01/09/1978    Quit date: 03/09/2013    Years since quitting: 5.3  . Smokeless tobacco: Never Used  Substance Use Topics  . Alcohol use: Yes    Alcohol/week: 0.0 standard drinks    Comment: Socially      Current Outpatient Medications:  .  amLODipine-valsartan (EXFORGE) 10-160 MG tablet, Take 1 tablet by mouth daily., Disp: 90 tablet, Rfl: 1 .  aspirin EC 81 MG tablet, Take 81 mg by mouth daily. , Disp: , Rfl:  .  azithromycin (AZASITE) 1 % ophthalmic solution, Apply 1 drop in LEFT eye twice for one day, then once daily for 4 days. (5 days total), Disp: 2.5 mL, Rfl: 0 .  carvedilol (COREG) 25 MG tablet, TAKE 1 TABLET BY MOUTH TWICE DAILY WITH A MEAL, Disp: 180 tablet, Rfl: 1 .  diclofenac sodium (VOLTAREN) 1 % GEL, Apply 2 g topically as needed. , Disp: , Rfl:  .  Evolocumab (REPATHA SURECLICK) 993 MG/ML SOAJ, Inject 1 each into the skin every 14 (fourteen) days., Disp: 2 pen, Rfl: 5 .  ezetimibe (ZETIA) 10 MG tablet, Take 1 tablet (10 mg total) by mouth daily., Disp: 30 tablet, Rfl: 11 .  fluticasone (FLONASE) 50 MCG/ACT nasal spray, Place 2 sprays into both nostrils daily. (Patient taking differently: Place 2 sprays into both nostrils as needed. ), Disp: 16 g, Rfl: 2 .  levothyroxine  (SYNTHROID, LEVOTHROID) 137 MCG tablet, Take 1 tablet by mouth daily before breakfast on Tuesday, Thursday, Saturday and Sunday, Disp: 17 tablet, Rfl: 11 .  levothyroxine (SYNTHROID, LEVOTHROID) 150 MCG tablet, Take 1 tablet by mouth once daily on Monday, Wednesday and Friday and take 137 mcg on the other days., Disp: 78 tablet, Rfl: 0 .  liraglutide (VICTOZA) 18 MG/3ML SOPN, Inject 0.2 mLs (1.2 mg total) into the skin daily., Disp: 5 pen, Rfl: 11 .  meclizine (ANTIVERT) 25 MG tablet, Take 1 tablet (25 mg total) by mouth Three (3) times a day as needed for dizziness., Disp: 30 tablet, Rfl: 1 .  metFORMIN (GLUCOPHAGE-XR) 500 MG 24 hr tablet, TAKE 1 TABLET BY MOUTH  ONCE DAILY WITH BREAKFAST, Disp: 30 tablet, Rfl: 5 .  Insulin Pen Needle (BD PEN NEEDLE NANO U/F) 32G X 4 MM MISC, Use for insulin administration four times daily., Disp: 200 each, Rfl: 1 .  ONE TOUCH ULTRA TEST test strip, Check sugar 3 times daily, Disp: 100 each, Rfl: 5  Allergies  Allergen Reactions  . Atorvastatin Other (See Comments)    Other reaction(s): Muscle Pain  . Hydralazine Other (See Comments)    Aggravated gout Aggravated gout  . Hydrochlorothiazide Other (See Comments)    Aggravated gout  . Lisinopril Cough  . Pravastatin Other (See Comments)    Other reaction(s): Muscle Pain  . Rosuvastatin Other (See Comments)    Other reaction(s): Muscle Pain    Review of Systems  Constitutional: Positive for malaise/fatigue. Negative for chills and fever.  HENT: Negative for congestion, sinus pain and sore throat.   Eyes: Negative for blurred vision.  Respiratory: Negative for cough and shortness of breath.   Cardiovascular: Positive for palpitations. Negative for chest pain and leg swelling.  Gastrointestinal: Negative for abdominal pain, constipation, diarrhea and nausea.  Genitourinary: Negative for dysuria.  Musculoskeletal: Negative for falls and joint pain.  Skin: Negative for rash.  Neurological: Negative for  dizziness, tingling and headaches.  Endo/Heme/Allergies: Negative for polydipsia.  Psychiatric/Behavioral: The patient is not nervous/anxious and does not have insomnia.       No other specific complaints in a complete review of systems (except as listed in HPI above).  Objective  Vitals:   07/23/18 1002  BP: 108/62  Pulse: 64  Resp: 14  Temp: (!) 96.9 F (36.1 C)  TempSrc: Temporal  SpO2: 98%  Weight: 261 lb 11.2 oz (118.7 kg)  Height: 5\' 4"  (1.626 m)    Body mass index is 44.92 kg/m.  Nursing Note and Vital Signs reviewed.  Physical Exam Vitals signs reviewed.  Constitutional:      Appearance: She is well-developed.  HENT:     Head: Normocephalic and atraumatic.  Neck:     Musculoskeletal: Normal range of motion and neck supple.     Vascular: No carotid bruit.  Cardiovascular:     Heart sounds: Normal heart sounds.  Pulmonary:     Effort: Pulmonary effort is normal.     Breath sounds: Normal breath sounds.  Abdominal:     General: Bowel sounds are normal.     Palpations: Abdomen is soft.     Tenderness: There is no abdominal tenderness.  Musculoskeletal: Normal range of motion.  Skin:    General: Skin is warm and dry.     Capillary Refill: Capillary refill takes less than 2 seconds.  Neurological:     Mental Status: She is alert and oriented to person, place, and time.     GCS: GCS eye subscore is 4. GCS verbal subscore is 5. GCS motor subscore is 6.     Sensory: No sensory deficit.  Psychiatric:        Speech: Speech normal.        Behavior: Behavior normal.        Thought Content: Thought content normal.        Judgment: Judgment normal.        No results found for this or any previous visit (from the past 48 hour(s)).  Assessment & Plan  1. Essential hypertension, benign -cutting coreg in half BID- follow up in 2 weeks.   2. Paroxysmal atrial fibrillation (HCC) Intermittent- cut down on caffeine- if occurring  more frequently will separate  combo pill and take out amlodipine   3. Chronic combined systolic and diastolic heart failure (HCC) Some swelling in lower extremities- discussed sodium restriction and weight loss   4. Hypothyroidism, unspecified type stable - TSH  5. Diabetes mellitus type 2 in obese (HCC) stable - Basic Metabolic Panel (BMET)  6. Mixed hyperlipidemia Continue meds.  - Lipid Profile    -Red flags and when to present for emergency care or RTC including fever >101.65F, chest pain, shortness of breath, new/worsening/un-resolving symptoms,  reviewed with patient at time of visit. Follow up and care instructions discussed and provided in AVS.

## 2018-07-23 NOTE — Patient Instructions (Addendum)
- For swelling: Continue drinking plenty of water, cut down sodium under 2 grams at least but goal to be closer to 1.5 grams OR 1500mg . Wear compression stockings during the day, set a timer to take breaks to walk around, elevate your legs when possible and work on modest weight loss.   - Please monitor your blood pressure for a goal of 110-140 on the top and 60-90 on the bottom. Since your blood pressure is on the lower end of normal today we can cut your coreg to 12.5mg  twice a day and monitor your blood pressure readings from there.  Edema  Edema is an abnormal buildup of fluids in the body tissues and under the skin. Swelling of the legs, feet, and ankles is a common symptom that becomes more likely as you get older. Swelling is also common in looser tissues, like around the eyes. When the affected area is squeezed, the fluid may move out of that spot and leave a dent for a few moments. This dent is called pitting edema. There are many possible causes of edema. Eating too much salt (sodium) and being on your feet or sitting for a long time can cause edema in your legs, feet, and ankles. Hot weather may make edema worse. Common causes of edema include:  Heart failure.  Liver or kidney disease.  Weak leg blood vessels.  Cancer.   An injury.  Pregnancy.  Medicines.  Being obese.  Low protein levels in the blood. Edema is usually painless. Your skin may look swollen or shiny. Follow these instructions at home:  Keep the affected body part raised (elevated) above the level of your heart when you are sitting or lying down.  Do not sit still or stand for long periods of time.  Do not wear tight clothing. Do not wear garters on your upper legs.  Exercise your legs to get your circulation going. This helps to move the fluid back into your blood vessels, and it may help the swelling go down.  Wear elastic bandages or support stockings to reduce swelling as told by your health care  provider.  Eat a low-salt (low-sodium) diet to reduce fluid as told by your health care provider.  Depending on the cause of your swelling, you may need to limit how much fluid you drink (fluid restriction).  Take over-the-counter and prescription medicines only as told by your health care provider. Contact a health care provider if:  Your edema does not get better with treatment.  You have heart, liver, or kidney disease and have symptoms of edema.  You have sudden and unexplained weight gain. Get help right away if:  You develop shortness of breath or chest pain.  You cannot breathe when you lie down.  You develop pain, redness, or warmth in the swollen areas.  You have heart, liver, or kidney disease and suddenly get edema.  You have a fever and your symptoms suddenly get worse. Summary  Edema is an abnormal buildup of fluids in the body tissues and under the skin.  Eating too much salt (sodium) and being on your feet or sitting for a long time can cause edema in your legs, feet, and ankles.  Keep the affected body part raised (elevated) above the level of your heart when you are sitting or lying down. This information is not intended to replace advice given to you by your health care provider. Make sure you discuss any questions you have with your health care provider. Document  Released: 12/26/2004 Document Revised: 12/29/2016 Document Reviewed: 01/29/2016 Elsevier Patient Education  2020 [employer].

## 2018-07-24 LAB — LIPID PANEL
Cholesterol: 82 mg/dL (ref <200)
HDL: 35 mg/dL — ABNORMAL LOW (ref >=50)
LDL Cholesterol (Calc): 26 mg/dL (calc)
Non-HDL Cholesterol (Calc): 47 mg/dL (calc) (ref <130)
Total CHOL/HDL Ratio: 2.3 (calc) (ref <5.0)
Triglycerides: 130 mg/dL (ref <150)

## 2018-07-24 LAB — BASIC METABOLIC PANEL
BUN: 10 mg/dL (ref 7–25)
CO2: 29 mmol/L (ref 20–32)
Calcium: 8.9 mg/dL (ref 8.6–10.4)
Chloride: 105 mmol/L (ref 98–110)
Creat: 0.83 mg/dL (ref 0.50–1.05)
Glucose, Bld: 164 mg/dL — ABNORMAL HIGH (ref 65–99)
Potassium: 3.6 mmol/L (ref 3.5–5.3)
Sodium: 138 mmol/L (ref 135–146)

## 2018-07-24 LAB — TSH: TSH: 2.31 mIU/L (ref 0.40–4.50)

## 2018-08-05 ENCOUNTER — Telehealth: Payer: Self-pay | Admitting: Family Medicine

## 2018-08-05 NOTE — Telephone Encounter (Signed)
Patient is calling to confirm appt for tomorrow. Is this over the telephone? Please advise Cb- [phone]

## 2018-08-06 ENCOUNTER — Ambulatory Visit (INDEPENDENT_AMBULATORY_CARE_PROVIDER_SITE_OTHER): Payer: BC Managed Care – PPO | Admitting: Nurse Practitioner

## 2018-08-06 ENCOUNTER — Other Ambulatory Visit: Payer: Self-pay

## 2018-08-06 ENCOUNTER — Encounter: Payer: Self-pay | Admitting: Nurse Practitioner

## 2018-08-06 VITALS — BP 116/67 | HR 58

## 2018-08-06 DIAGNOSIS — I10 Essential (primary) hypertension: Secondary | ICD-10-CM

## 2018-08-06 DIAGNOSIS — I480 Paroxysmal atrial fibrillation: Secondary | ICD-10-CM

## 2018-08-06 NOTE — Progress Notes (Signed)
Virtual Visit via Video Note  I connected with Elizabeth Russo on 08/06/18 at  1:40 PM EDT by a video enabled telemedicine application and verified that I am speaking with the correct person using two identifiers.   Staff discussed the limitations of evaluation and management by telemedicine and the availability of in person appointments. The patient expressed understanding and agreed to proceed.  Patient location: work  My location: work office Other people present:  none HPI  Patient presents for 2 week follow-up on blood pressure. At her last visit patient was noted to be hypotensive, and noted lightheadedness with position changes on amlodipine 10mg , valsartan 160mg  and coreg 25mg  BID. We cut her coreg down to 12.5mg  BID. States she has not switched to this new dosage, states has been trying to drink more water instead. States she has been drinking about 3 16 ounce bottles a day.   BP Readings from Last 3 Encounters:  08/06/18 116/67  07/23/18 108/62  06/07/18 124/70     Patient Active Problem List   Diagnosis Date Noted  . Venous stasis 04/13/2017  . History of non-ST elevation myocardial infarction (NSTEMI) 02/05/2015  . Hx of percutaneous transluminal coronary angioplasty 02/05/2015  . Gout of knee 12/25/2014  . Low back pain 12/25/2014  . Morbid obesity ([place]) 06/26/2014  . Chronic combined systolic and diastolic heart failure ([place]) 05/24/2014  . Goiter 03/19/2014  . Coronary atherosclerosis 12/19/2013  . Hyperlipidemia 12/19/2013  . Essential hypertension, benign 12/18/2013  . Diabetes mellitus type 2 in obese ([place]) 12/18/2013  . Hypothyroidism 12/18/2013  . Hearing loss 08/25/2011  . Atrial fibrillation ([place]) 02/13/2011    Past Medical History:  Diagnosis Date  . Breast mass, right 04/25/2014  . CAD (coronary artery disease)   . Diabetes mellitus without complication ([place])   . Diverticulosis   . Family history of colon cancer in father   . Hypertension   .  Hypothyroid   . Localized enlarged lymph nodes 10/23/2014   cervical   . Morbid obesity ([place]) 06/26/2014  . Myalgia due to statin 04/13/2017   Intolerant to three different statins  . Personal history of colonic polyps   . Vertigo     Past Surgical History:  Procedure Laterality Date  . ABDOMINAL HYSTERECTOMY    . COLONOSCOPY WITH PROPOFOL N/A 08/13/2017   Procedure: COLONOSCOPY WITH PROPOFOL;  Surgeon: Lin Landsman, MD;  Location[facility] ENDOSCOPY;  Service: Gastroenterology;  Laterality: N/A;  . heart stent  2012    Social History   Tobacco Use  . Smoking status: Former Smoker    Packs/day: 0.50    Years: 30.00    Pack years: 15.00    Types: Cigarettes    Start date: 01/09/1978    Quit date: 03/09/2013    Years since quitting: 5.4  . Smokeless tobacco: Never Used  Substance Use Topics  . Alcohol use: Yes    Alcohol/week: 0.0 standard drinks    Comment: Socially      Current Outpatient Medications:  .  amLODipine-valsartan (EXFORGE) 10-160 MG tablet, Take 1 tablet by mouth daily., Disp: 90 tablet, Rfl: 1 .  aspirin EC 81 MG tablet, Take 81 mg by mouth daily. , Disp: , Rfl:  .  carvedilol (COREG) 25 MG tablet, Take 0.5 tablets (12.5 mg total) by mouth 2 (two) times daily with a meal., Disp: 90 tablet, Rfl: 1 .  diclofenac sodium (VOLTAREN) 1 % GEL, Apply 2 g topically as needed. , Disp: , Rfl:  .  Evolocumab (REPATHA SURECLICK) 626 MG/ML SOAJ, Inject 1 each into the skin every 14 (fourteen) days., Disp: 2 pen, Rfl: 5 .  ezetimibe (ZETIA) 10 MG tablet, Take 1 tablet (10 mg total) by mouth daily., Disp: 30 tablet, Rfl: 11 .  fluticasone (FLONASE) 50 MCG/ACT nasal spray, Place 2 sprays into both nostrils daily. (Patient taking differently: Place 2 sprays into both nostrils as needed. ), Disp: 16 g, Rfl: 2 .  Insulin Pen Needle (BD PEN NEEDLE NANO U/F) 32G X 4 MM MISC, Use for insulin administration four times daily., Disp: 200 each, Rfl: 1 .  levothyroxine (SYNTHROID,  LEVOTHROID) 137 MCG tablet, Take 1 tablet by mouth daily before breakfast on Tuesday, Thursday, Saturday and Sunday, Disp: 17 tablet, Rfl: 11 .  levothyroxine (SYNTHROID, LEVOTHROID) 150 MCG tablet, Take 1 tablet by mouth once daily on Monday, Wednesday and Friday and take 137 mcg on the other days., Disp: 78 tablet, Rfl: 0 .  liraglutide (VICTOZA) 18 MG/3ML SOPN, Inject 0.2 mLs (1.2 mg total) into the skin daily., Disp: 5 pen, Rfl: 11 .  meclizine (ANTIVERT) 25 MG tablet, Take 1 tablet (25 mg total) by mouth Three (3) times a day as needed for dizziness., Disp: 30 tablet, Rfl: 1 .  metFORMIN (GLUCOPHAGE-XR) 500 MG 24 hr tablet, TAKE 1 TABLET BY MOUTH ONCE DAILY WITH BREAKFAST, Disp: 30 tablet, Rfl: 5 .  ONE TOUCH ULTRA TEST test strip, Check sugar 3 times daily, Disp: 100 each, Rfl: 5  Allergies  Allergen Reactions  . Atorvastatin Other (See Comments)    Other reaction(s): Muscle Pain  . Hydralazine Other (See Comments)    Aggravated gout Aggravated gout  . Hydrochlorothiazide Other (See Comments)    Aggravated gout  . Lisinopril Cough  . Pravastatin Other (See Comments)    Other reaction(s): Muscle Pain  . Rosuvastatin Other (See Comments)    Other reaction(s): Muscle Pain    ROS   No other specific complaints in a complete review of systems (except as listed in HPI above).  Objective  Vitals:   08/06/18 1150  BP: 116/67  Pulse: (!) 58     There is no height or weight on file to calculate BMI.  Nursing Note and Vital Signs reviewed.  Physical Exam   Constitutional: Patient appears well-developed and well-nourished. No distress.  HENT: Head: Normocephalic and atraumatic. Cardiovascular: slow rate Pulmonary/Chest: Effort normal   Neurological: alert and oriented, speech normal.  Psychiatric: Patient has a normal mood and affect. behavior is normal. Judgment and thought content normal.    Assessment & Plan  1. Essential hypertension Patient did not adjust dosage  and BP has been fine, will trial increasing hydration and slow transitions, if not improved orthostasis will cut meds.   2. Paroxysmal atrial fibrillation (HCC) No palpitations    Follow Up Instructions:    I discussed the assessment and treatment plan with the patient. The patient was provided an opportunity to ask questions and all were answered. The patient agreed with the plan and demonstrated an understanding of the instructions.   The patient was advised to call back or seek an in-person evaluation if the symptoms worsen or if the condition fails to improve as anticipated.  I provided 43minutes of non-face-to-face time during this encounter.   Fredderick Severance, NP

## 2018-08-09 ENCOUNTER — Other Ambulatory Visit: Payer: Self-pay | Admitting: Family Medicine

## 2018-08-09 NOTE — Telephone Encounter (Signed)
MAILBOX FULL

## 2018-08-09 NOTE — Telephone Encounter (Signed)
Please set up routine appointment in 4 months

## 2018-08-19 ENCOUNTER — Other Ambulatory Visit: Payer: Self-pay

## 2018-08-19 MED ORDER — REPATHA SURECLICK 140 MG/ML ~~LOC~~ SOAJ: 1.0000 | SUBCUTANEOUS | 5 refills | Status: DC

## 2018-08-21 ENCOUNTER — Other Ambulatory Visit: Payer: Self-pay | Admitting: Family Medicine

## 2018-08-21 ENCOUNTER — Other Ambulatory Visit: Payer: Self-pay | Admitting: Nurse Practitioner

## 2018-08-21 DIAGNOSIS — R42 Dizziness and giddiness: Secondary | ICD-10-CM

## 2018-08-21 MED ORDER — PRALUENT 75 MG/ML ~~LOC~~ SOAJ: 75.0000 mg | SUBCUTANEOUS | 1 refills | Status: DC

## 2018-08-23 ENCOUNTER — Telehealth: Payer: Self-pay

## 2018-08-23 NOTE — Telephone Encounter (Signed)
Copied from [place] [id number]. Topic: General - Inquiry >> Aug 23, 2018  2:08 PM Richardo Priest, [place] wrote: Reason for CRM: Pharmacy called in stating that a PA is still needing to be put through the insurance even though Alirocumab ([place]) 65 MG/ML SOAJ was sent in to replace medication previously asked for. Please advise.   I spoke with Benjamine Mola and she prefers to do a PA on the [place] instead.

## 2018-08-30 NOTE — Telephone Encounter (Signed)
Pharmacy is calling again to check the status of the medication.  She stated that there was a mistake made and they have been trying to get it filled for about two weeks.  Please call back to discuss.  CB# [phone]

## 2018-09-02 NOTE — Telephone Encounter (Signed)
The PA was done online via CoverMyMeds and they faxed a paper back that required the physician's signature. Once it has been signed then it will be faxed back to them.

## 2018-09-05 NOTE — Telephone Encounter (Signed)
Information was faxed back to the insurance company on yesterday. I will fax a copy to them as requested [phone].

## 2018-09-05 NOTE — Telephone Encounter (Signed)
Pharmacy called back in, advised per notes below. They are stating that the PA is still not showing approved and would like further assistance.

## 2018-09-05 NOTE — Telephone Encounter (Signed)
Pharmacy called in again to check on status of PA and would like it faxed to [phone] when done.

## 2018-09-06 NOTE — Telephone Encounter (Signed)
I tried to contact this patient's pharmacy so that we can find out what we need to do in order to get this medication approved for this patient, but there was no answer. A message was left for them to give [place] a call back when they got the chance.

## 2018-09-12 ENCOUNTER — Telehealth: Payer: Self-pay

## 2018-09-12 MED ORDER — GLUCOSE BLOOD VI STRP: ORAL_STRIP | 12 refills | Status: DC

## 2018-09-12 NOTE — Telephone Encounter (Signed)
One touch ultra strips 

## 2018-10-16 ENCOUNTER — Other Ambulatory Visit: Payer: Self-pay | Admitting: Family Medicine

## 2018-11-08 ENCOUNTER — Encounter: Payer: Self-pay | Admitting: Family Medicine

## 2018-11-08 ENCOUNTER — Other Ambulatory Visit: Payer: Self-pay

## 2018-11-08 ENCOUNTER — Ambulatory Visit: Payer: BC Managed Care – PPO | Admitting: Family Medicine

## 2018-11-08 VITALS — BP 118/64 | HR 67 | Temp 96.8°F | Resp 14 | Ht 64.0 in | Wt 262.7 lb

## 2018-11-08 DIAGNOSIS — E039 Hypothyroidism, unspecified: Secondary | ICD-10-CM | POA: Diagnosis not present

## 2018-11-08 DIAGNOSIS — E1169 Type 2 diabetes mellitus with other specified complication: Secondary | ICD-10-CM | POA: Diagnosis not present

## 2018-11-08 DIAGNOSIS — Z1231 Encounter for screening mammogram for malignant neoplasm of breast: Secondary | ICD-10-CM

## 2018-11-08 DIAGNOSIS — M791 Myalgia, unspecified site: Secondary | ICD-10-CM

## 2018-11-08 DIAGNOSIS — E782 Mixed hyperlipidemia: Secondary | ICD-10-CM

## 2018-11-08 DIAGNOSIS — I252 Old myocardial infarction: Secondary | ICD-10-CM

## 2018-11-08 DIAGNOSIS — Z23 Encounter for immunization: Secondary | ICD-10-CM

## 2018-11-08 DIAGNOSIS — E669 Obesity, unspecified: Secondary | ICD-10-CM

## 2018-11-08 DIAGNOSIS — Z6841 Body Mass Index (BMI) 40.0 and over, adult: Secondary | ICD-10-CM

## 2018-11-08 DIAGNOSIS — Z5181 Encounter for therapeutic drug level monitoring: Secondary | ICD-10-CM

## 2018-11-08 DIAGNOSIS — I480 Paroxysmal atrial fibrillation: Secondary | ICD-10-CM

## 2018-11-08 DIAGNOSIS — I5042 Chronic combined systolic (congestive) and diastolic (congestive) heart failure: Secondary | ICD-10-CM

## 2018-11-08 DIAGNOSIS — T466X5A Adverse effect of antihyperlipidemic and antiarteriosclerotic drugs, initial encounter: Secondary | ICD-10-CM

## 2018-11-08 DIAGNOSIS — I10 Essential (primary) hypertension: Secondary | ICD-10-CM | POA: Diagnosis not present

## 2018-11-08 NOTE — Progress Notes (Signed)
Name: Elizabeth Russo   MRN: [id number]    DOB: 07/19/1960   Date:11/08/2018       Progress Note  Chief Complaint  Patient presents with  . Follow-up  . Diabetes  . Hypertension  . Hypothyroidism     Subjective:   Elizabeth Russo is a 58 y.o. female, presents to clinic for routine follow up on the conditions listed above.  Diabetes Mellitus Type II: Currently managing with victoza, metformin Pt notes excellent med compliance  Pt has no SE from meds. Fasting CBGs typically run 128, She is checking only about once a month, no hypoglycemic episodes Denies: Polyuria, polydipsia, polyphagia, vision changes, or neuropathy  Recent pertinent labs: Lab Results  Component Value Date   HGBA1C 5.9 (A) 04/22/2018   HGBA1C 5.9 04/22/2018   HGBA1C 5.9 04/22/2018   HGBA1C 5.9 04/22/2018      Component Value Date/Time   NA 138 07/23/2018 1042   NA 139 05/04/2015 0858   NA 142 01/19/2011 2244   K 3.6 07/23/2018 1042   K 3.3 (L) 01/19/2011 2244   CL 105 07/23/2018 1042   CL 106 01/19/2011 2244   CO2 29 07/23/2018 1042   CO2 27 01/19/2011 2244   GLUCOSE 164 (H) 07/23/2018 1042   GLUCOSE 183 (H) 01/19/2011 2244   BUN 10 07/23/2018 1042   BUN 8 05/04/2015 0858   BUN 11 01/19/2011 2244   CREATININE 0.83 07/23/2018 1042   CALCIUM 8.9 07/23/2018 1042   CALCIUM 8.6 01/19/2011 2244   PROT 7.4 01/21/2018 0844   PROT 7.2 05/04/2015 0858   ALBUMIN 3.9 04/04/2016 0932   ALBUMIN 4.0 05/04/2015 0858   AST 20 01/21/2018 0844   ALT 18 01/21/2018 0844   ALKPHOS 75 04/04/2016 0932   BILITOT 0.3 01/21/2018 0844   BILITOT 0.4 05/04/2015 0858   GFRNONAA 78 01/21/2018 0844   GFRAA 91 01/21/2018 0844    Current diet: in general, an "unhealthy" diet Current exercise: none  UTD on DM foot exam and eye exam ACEI/ARB: Yes Statin: Yes  Hypothyroidism: History: hypothyroid, unspecified Current Medication Regimen: 137 mcg 4 days a week and 150 mcg 3 days a week Takes medicine morning  first thing - waiting to eat and take other meds Current Symptoms: denies fatigue, weight changes, heat/cold intolerance, bowel/skin changes or CVS symptoms Most recent results are below; we will be repeating labs today. Lab Results  Component Value Date   TSH 2.31 07/23/2018   Hypertension:  Currently managed on coreg, amlodipine-valsartan Pt reports excellent med compliance and denies any SE.  No lightheadedness, hypotension, syncope. Blood pressure today is well controlled. BP Readings from Last 3 Encounters:  11/08/18 118/64  08/06/18 116/67  07/23/18 108/62   Pt denies CP, SOB, exertional sx, LE edema, palpitation, Ha's, visual disturbances Dietary efforts for BP?   Having frequent palpitations/flutter with hx of Afib, palpitations are not associated with lightheadedness, syncope, SOB, CP, orthopnea, lower extremity edema, PND She also has history of heart failure and NSTEMI  and hasn't seen cardiology in "several years", she was going to establish more locally, but has not.  She was previously seen at [facility] She reports her Last nuc stress test was negative  Review of care everywhere shows 02/16/2006 nuc med myocardial perfusion study at [facility], done for dyspnea on exertion: Cardiac hx copied from that time with study:  Patient History: c/o DOE ( her anginal equivalent ) after walking 200 yards, inadequate exercise echocardiogram 01/25/2016  h/o CAD, combined  systolic/ diastolic dysfunction, gout,  Cardiac History: s/p NSTEMI and PCI of OM2 (01/2011)  EF 45% on LHC 01/20/2011; 50% dLAD, 90% OM1, 50% pRCA and 100% mRCA at this time  1st-degree AVB and NS ST-segment depression on ECG 01/2015  Hypertension. Hyperlipidemia. Family History. History of MI. Revascularization (PTCA).  Procedure Date   MI 01/19/2011   PTCA 01/20/2011   ECG 01/2015   Cardiac Cath 01/20/2011    Hyperlipidemia:  Current Medication Regimen:  zetia -multiple statin allergies/myalgias Last Lipids: Lab Results   Component Value Date   CHOL 82 07/23/2018   HDL 35 (L) 07/23/2018   LDLCALC 26 07/23/2018   TRIG 130 07/23/2018   CHOLHDL 2.3 07/23/2018   - Current Diet:  Poor, no efforts right now - Denies: Chest pain, shortness of breath, myalgias. - Documented aortic atherosclerosis? Yes - Risk factors for atherosclerosis: arteriosclerotic heart disease, diabetes mellitus, hypercholesterolemia and hypertension  Pt reports some hx of mild ankle swelling during the day some days, worsens throughout the day and improves overnight or with elevation.  No persistent or progressive lower extremity edema no pitting, no sudden weight gain.  She does have history of venous stasis.  She is not doing low-salt or any compression stockings no wounds sores rashes or varicosities -lower extremity edema is very mild    Patient Active Problem List   Diagnosis Date Noted  . Venous stasis 04/13/2017  . History of non-ST elevation myocardial infarction (NSTEMI) 02/05/2015  . Hx of percutaneous transluminal coronary angioplasty 02/05/2015  . Gout of knee 12/25/2014  . Low back pain 12/25/2014  . Morbid obesity ([place]) 06/26/2014  . Chronic combined systolic and diastolic heart failure ([place]) 05/24/2014  . Goiter 03/19/2014  . Coronary atherosclerosis 12/19/2013  . Hyperlipidemia 12/19/2013  . Essential hypertension, benign 12/18/2013  . Diabetes mellitus type 2 in obese ([place]) 12/18/2013  . Hypothyroidism 12/18/2013  . Hearing loss 08/25/2011  . Atrial fibrillation ([place]) 02/13/2011    Past Surgical History:  Procedure Laterality Date  . ABDOMINAL HYSTERECTOMY    . COLONOSCOPY WITH PROPOFOL N/A 08/13/2017   Procedure: COLONOSCOPY WITH PROPOFOL;  Surgeon: Lin Landsman, MD;  Location: [facility] ENDOSCOPY;  Service: Gastroenterology;  Laterality: N/A;  . heart stent  2012    Family History  Problem Relation Age of Onset  . Heart disease Mother   . Diabetes Mother   . Hypertension Mother   . Hyperlipidemia  Mother   . Thyroid disease Mother   . Cancer Father        colon  . Diabetes Father   . Hypertension Father   . Diabetes Sister   . Hypertension Sister   . Heart disease Sister   . Hyperlipidemia Sister   . Kidney disease Sister   . Cancer Maternal Grandfather        lung  . Heart attack Paternal Grandfather   . Heart disease Sister   . Diabetes Sister   . Heart disease Sister   . Arthritis Sister     Social History   Socioeconomic History  . Marital status: Widowed    Spouse name: Not on file  . Number of children: 2  . Years of education: Not on file  . Highest education level: High school graduate  Occupational History  . Not on file  Social Needs  . Financial resource strain: Somewhat hard  . Food insecurity    Worry: Never true    Inability: Never true  . Transportation needs  Medical: No    Non-medical: No  Tobacco Use  . Smoking status: Former Smoker    Packs/day: 0.50    Years: 30.00    Pack years: 15.00    Types: Cigarettes    Start date: 01/09/1978    Quit date: 03/09/2013    Years since quitting: 5.6  . Smokeless tobacco: Never Used  Substance and Sexual Activity  . Alcohol use: Yes    Alcohol/week: 0.0 standard drinks    Comment: Socially   . Drug use: No  . Sexual activity: Yes    Birth control/protection: Post-menopausal  Lifestyle  . Physical activity    Days per week: 6 days    Minutes per session: 30 min  . Stress: Not at all  Relationships  . Social connections    Talks on phone: More than three times a week    Gets together: More than three times a week    Attends religious service: More than 4 times per year    Active member of club or organization: No    Attends meetings of clubs or organizations: Never    Relationship status: Widowed  . Intimate partner violence    Fear of current or ex partner: No    Emotionally abused: No    Physically abused: No    Forced sexual activity: No  Other Topics Concern  . Not on file  Social  History Narrative  . Not on file     Current Outpatient Medications:  .  Alirocumab (PRALUENT) 75 MG/ML SOAJ, Inject 75 mg into the skin every 14 (fourteen) days., Disp: 6 pen, Rfl: 1 .  amLODipine-valsartan (EXFORGE) 10-160 MG tablet, Take 1 tablet by mouth daily., Disp: 90 tablet, Rfl: 1 .  aspirin EC 81 MG tablet, Take 81 mg by mouth daily. , Disp: , Rfl:  .  carvedilol (COREG) 25 MG tablet, Take 0.5 tablets (12.5 mg total) by mouth 2 (two) times daily with a meal., Disp: 90 tablet, Rfl: 1 .  ezetimibe (ZETIA) 10 MG tablet, Take 1 tablet (10 mg total) by mouth daily., Disp: 30 tablet, Rfl: 11 .  fluticasone (FLONASE) 50 MCG/ACT nasal spray, Place 2 sprays into both nostrils daily. (Patient taking differently: Place 2 sprays into both nostrils as needed. ), Disp: 16 g, Rfl: 2 .  levothyroxine (SYNTHROID) 150 MCG tablet, Take 1 tablet by mouth once daily on Monday, Wednesday and Friday and take 137 mcg on the other days., Disp: 78 tablet, Rfl: 1 .  levothyroxine (SYNTHROID, LEVOTHROID) 137 MCG tablet, Take 1 tablet by mouth daily before breakfast on Tuesday, Thursday, Saturday and Sunday, Disp: 17 tablet, Rfl: 11 .  liraglutide (VICTOZA) 18 MG/3ML SOPN, Inject 0.2 mLs (1.2 mg total) into the skin daily., Disp: 5 pen, Rfl: 11 .  meclizine (ANTIVERT) 25 MG tablet, Take 1 tablet (25 mg total) by mouth Three (3) times a day as needed for dizziness., Disp: 30 tablet, Rfl: 1 .  metFORMIN (GLUCOPHAGE-XR) 500 MG 24 hr tablet, TAKE 1 TABLET BY MOUTH ONCE DAILY WITH BREAKFAST., Disp: 90 tablet, Rfl: 1 .  diclofenac sodium (VOLTAREN) 1 % GEL, Apply 2 g topically as needed. , Disp: , Rfl:  .  glucose blood test strip, Use as instructed, Disp: 100 each, Rfl: 12 .  Insulin Pen Needle (BD PEN NEEDLE NANO U/F) 32G X 4 MM MISC, Use for insulin administration four times daily., Disp: 200 each, Rfl: 1  Allergies  Allergen Reactions  . Atorvastatin Other (See Comments)  Other reaction(s): Muscle Pain  .  Hydralazine Other (See Comments)    Aggravated gout Aggravated gout  . Hydrochlorothiazide Other (See Comments)    Aggravated gout  . Lisinopril Cough  . Pravastatin Other (See Comments)    Other reaction(s): Muscle Pain  . Rosuvastatin Other (See Comments)    Other reaction(s): Muscle Pain    I personally reviewed active problem list, medication list, allergies, family history, social history, health maintenance, notes from last encounter, lab results, imaging with the patient/caregiver today.  Review of Systems  Constitutional: Negative.   HENT: Negative.   Eyes: Negative.   Respiratory: Negative.  Negative for cough, choking, chest tightness and shortness of breath.   Cardiovascular: Negative for chest pain.  Gastrointestinal: Negative.   Endocrine: Negative.   Genitourinary: Negative.   Musculoskeletal: Negative.   Skin: Negative.   Allergic/Immunologic: Negative.   Neurological: Negative.   Hematological: Negative.   Psychiatric/Behavioral: Negative.   All other systems reviewed and are negative.    Objective:    Vitals:   11/08/18 0810  BP: 118/64  Pulse: 67  Resp: 14  Temp: (!) 96.8 F (36 C)  SpO2: 99%  Weight: 262 lb 11.2 oz (119.2 kg)  Height: 5\' 4"  (1.626 m)    Body mass index is 45.09 kg/m.  Physical Exam Vitals signs and nursing note reviewed.  Constitutional:      General: She is not in acute distress.    Appearance: Normal appearance. She is well-developed. She is obese. She is not ill-appearing, toxic-appearing or diaphoretic.     Interventions: Face mask in place.  HENT:     Head: Normocephalic and atraumatic.     Right Ear: External ear normal.     Left Ear: External ear normal.  Eyes:     General: Lids are normal. No scleral icterus.       Right eye: No discharge.        Left eye: No discharge.     Conjunctiva/sclera: Conjunctivae normal.  Neck:     Musculoskeletal: Normal range of motion and neck supple.     Trachea: Phonation  normal. No tracheal deviation.     Comments: No JVD Cardiovascular:     Rate and Rhythm: Normal rate and regular rhythm.     Pulses: Normal pulses.          Radial pulses are 2+ on the right side and 2+ on the left side.       Posterior tibial pulses are 2+ on the right side and 2+ on the left side.     Heart sounds: Normal heart sounds. No murmur. No friction rub. No gallop.      Comments: No pitting LE edema Pulmonary:     Effort: Pulmonary effort is normal. No respiratory distress.     Breath sounds: Normal breath sounds. No stridor. No wheezing, rhonchi or rales.  Chest:     Chest wall: No tenderness.  Abdominal:     General: Bowel sounds are normal. There is no distension.     Palpations: Abdomen is soft.     Tenderness: There is no abdominal tenderness. There is no guarding or rebound.  Musculoskeletal: Normal range of motion.        General: No deformity.     Right lower leg: No edema.     Left lower leg: No edema.  Lymphadenopathy:     Cervical: No cervical adenopathy.  Skin:    General: Skin is warm and dry.  Capillary Refill: Capillary refill takes less than 2 seconds.     Coloration: Skin is not jaundiced or pale.     Findings: No rash.  Neurological:     Mental Status: She is alert and oriented to person, place, and time.     Motor: No abnormal muscle tone.     Gait: Gait normal.  Psychiatric:        Speech: Speech normal.        Behavior: Behavior normal.      No results found for this or any previous visit (from the past 2160 hour(s)).  PHQ2/9: Depression screen [facility] 2/9 11/08/2018 08/06/2018 07/23/2018 07/01/2018 06/07/2018  Decreased Interest 0 0 0 0 0  Down, Depressed, Hopeless 0 0 0 0 0  PHQ - 2 Score 0 0 0 0 0  Altered sleeping 0 0 0 0 0  Tired, decreased energy 0 0 0 0 0  Change in appetite 0 0 0 0 0  Feeling bad or failure about yourself  0 0 0 0 0  Trouble concentrating 0 0 0 0 0  Moving slowly or fidgety/restless 0 0 0 0 0  Suicidal thoughts 0  0 0 0 0  PHQ-9 Score 0 0 0 0 0  Difficult doing work/chores Not difficult at all Not difficult at all Not difficult at all Not difficult at all Not difficult at all  Some recent data might be hidden    phq 9 is negative Reviewed today  Fall Risk: Fall Risk  11/08/2018 08/06/2018 07/23/2018 07/01/2018 06/07/2018  Falls in the past year? 0 0 0 0 0  Number falls in past yr: 0 0 0 0 0  Injury with Fall? 0 0 0 0 0  Follow up - - - - Falls evaluation completed    Functional Status Survey: Is the patient deaf or have difficulty hearing?: No Does the patient have difficulty seeing, even when wearing glasses/contacts?: No Does the patient have difficulty concentrating, remembering, or making decisions?: No Does the patient have difficulty walking or climbing stairs?: No Does the patient have difficulty dressing or bathing?: No Does the patient have difficulty doing errands alone such as visiting a doctor's office or shopping?: No    Assessment & Plan:     ICD-10-CM   1. Diabetes mellitus type 2 in obese (HCC)  [id number] COMPLETE METABOLIC PANEL WITH GFR   E66.9 Hemoglobin A1c   Has been well controlled, compliant with meds  2. Essential hypertension  99991111 COMPLETE METABOLIC PANEL WITH GFR   Currently well controlled  3. Hypothyroidism, unspecified type  E03.9 TSH   No symptoms suggestive of chemical hypothyroid we will check labs monitor to ensure its not contributing to her morbid obesity  4. History of non-ST elevation myocardial infarction (NSTEMI)  I25.2 Ambulatory referral to Cardiology   Last cardiac testing was in 2018 with [facility], she is compliant with medications, established with cardiology locally  5. Chronic combined systolic and diastolic heart failure (HCC)  I50.42 Ambulatory referral to Cardiology   Currently appears euvolemic no recent fluid overload, and no symptoms suggestive of progressive heart failure, established with cards  6. Paroxysmal atrial fibrillation (HCC)  I48.0  Ambulatory referral to Cardiology   constant palpitations, on coreg 12.5 BID, other than palpitations no other associated symptoms needs to establish with cardiology locally  7. Mixed hyperlipidemia  99991111 COMPLETE METABOLIC PANEL WITH GFR    Lipid panel   zetia, statin intolerant, LDL with last labs low  8. Myalgia  due to statin  M79.10    T46.6X5A    statin intolerance in the past tried atorvastatin, pravastatin, rosuvastatin  9. Class 3 severe obesity with serious comorbidity and body mass index (BMI) of 45.0 to 49.9 in adult, unspecified obesity type (HCC)  E66.01    Z68.42    discussed diet and exercise, calorie reduction to help with reduction of BMI and improved CV health and decreased risk  10. Encounter for medication monitoring  Z51.81 CBC with Differential/Platelet    COMPLETE METABOLIC PANEL WITH GFR    Lipid panel    Hemoglobin A1c    TSH  11. Need for Tdap vaccination  Z23 CANCELED: Tdap vaccine greater than or equal to 7yo IM  12. Encounter for screening mammogram for malignant neoplasm of breast  Z12.31    last at [facility] 10/29/2017, gets done at [facility] - offered order, but she will get done at [facility]     Return in about 4 months (around 03/09/2019) for Routine follow-up.   Delsa Grana, PA-C 11/08/18 8:16 AM

## 2018-11-09 LAB — HEMOGLOBIN A1C
Hgb A1c MFr Bld: 6 % of total Hgb — ABNORMAL HIGH (ref <5.7)
Mean Plasma Glucose: 126 (calc)
eAG (mmol/L): 7 (calc)

## 2018-11-09 LAB — COMPLETE METABOLIC PANEL WITH GFR
AG Ratio: 1.2 (calc) (ref 1.0–2.5)
ALT: 16 U/L (ref 6–29)
AST: 20 U/L (ref 10–35)
Albumin: 3.9 g/dL (ref 3.6–5.1)
Alkaline phosphatase (APISO): 68 U/L (ref 37–153)
BUN: 9 mg/dL (ref 7–25)
CO2: 26 mmol/L (ref 20–32)
Calcium: 8.9 mg/dL (ref 8.6–10.4)
Chloride: 106 mmol/L (ref 98–110)
Creat: 0.86 mg/dL (ref 0.50–1.05)
GFR, Est African American: 87 mL/min/{1.73_m2} (ref >=60)
GFR, Est Non African American: 75 mL/min/{1.73_m2} (ref >=60)
Globulin: 3.3 g/dL (calc) (ref 1.9–3.7)
Glucose, Bld: 106 mg/dL — ABNORMAL HIGH (ref 65–99)
Potassium: 3.8 mmol/L (ref 3.5–5.3)
Sodium: 139 mmol/L (ref 135–146)
Total Bilirubin: 0.3 mg/dL (ref 0.2–1.2)
Total Protein: 7.2 g/dL (ref 6.1–8.1)

## 2018-11-09 LAB — CBC WITH DIFFERENTIAL/PLATELET
Absolute Monocytes: 420 cells/uL (ref 200–950)
Basophils Absolute: 50 cells/uL (ref 0–200)
Basophils Relative: 1 %
Eosinophils Absolute: 200 cells/uL (ref 15–500)
Eosinophils Relative: 4 %
HCT: 39.1 % (ref 35.0–45.0)
Hemoglobin: 13.3 g/dL (ref 11.7–15.5)
Lymphs Abs: 2410 cells/uL (ref 850–3900)
MCH: 29.8 pg (ref 27.0–33.0)
MCHC: 34 g/dL (ref 32.0–36.0)
MCV: 87.7 fL (ref 80.0–100.0)
MPV: 9 fL (ref 7.5–12.5)
Monocytes Relative: 8.4 %
Neutro Abs: 1920 cells/uL (ref 1500–7800)
Neutrophils Relative %: 38.4 %
Platelets: 223 10*3/uL (ref 140–400)
RBC: 4.46 10*6/uL (ref 3.80–5.10)
RDW: 13.2 % (ref 11.0–15.0)
Total Lymphocyte: 48.2 %
WBC: 5 10*3/uL (ref 3.8–10.8)

## 2018-11-09 LAB — LIPID PANEL
Cholesterol: 122 mg/dL (ref <200)
HDL: 37 mg/dL — ABNORMAL LOW (ref >=50)
LDL Cholesterol (Calc): 67 mg/dL (calc)
Non-HDL Cholesterol (Calc): 85 mg/dL (calc) (ref <130)
Total CHOL/HDL Ratio: 3.3 (calc) (ref <5.0)
Triglycerides: 98 mg/dL (ref <150)

## 2018-11-09 LAB — TSH: TSH: 2.89 mIU/L (ref 0.40–4.50)

## 2018-11-14 ENCOUNTER — Other Ambulatory Visit: Payer: Self-pay | Admitting: Family Medicine

## 2018-11-18 ENCOUNTER — Ambulatory Visit (INDEPENDENT_AMBULATORY_CARE_PROVIDER_SITE_OTHER): Payer: BC Managed Care – PPO

## 2018-11-18 ENCOUNTER — Other Ambulatory Visit: Payer: Self-pay

## 2018-11-18 ENCOUNTER — Ambulatory Visit (INDEPENDENT_AMBULATORY_CARE_PROVIDER_SITE_OTHER): Payer: BC Managed Care – PPO | Admitting: Cardiology

## 2018-11-18 ENCOUNTER — Encounter: Payer: Self-pay | Admitting: Cardiology

## 2018-11-18 VITALS — BP 124/80 | HR 56 | Temp 97.0°F | Ht 64.0 in | Wt 263.2 lb

## 2018-11-18 DIAGNOSIS — I2510 Atherosclerotic heart disease of native coronary artery without angina pectoris: Secondary | ICD-10-CM

## 2018-11-18 DIAGNOSIS — R002 Palpitations: Secondary | ICD-10-CM

## 2018-11-18 DIAGNOSIS — E661 Drug-induced obesity: Secondary | ICD-10-CM | POA: Diagnosis not present

## 2018-11-18 DIAGNOSIS — Z6841 Body Mass Index (BMI) 40.0 and over, adult: Secondary | ICD-10-CM

## 2018-11-18 DIAGNOSIS — I5020 Unspecified systolic (congestive) heart failure: Secondary | ICD-10-CM

## 2018-11-18 NOTE — Progress Notes (Signed)
Cardiology Office Note:    Date:  11/18/2018   ID:  Elizabeth Russo, DOB 15-Apr-1960, MRN [id number]  PCP:  Delsa Grana, PA-C  Cardiologist:  Kate Sable, MD  Electrophysiologist:  None   Referring MD: Delsa Grana, PA-C   Elizabeth Russo is a 58 y.o. female who is being seen today for the evaluation of CAD and palpitations at the request of Delsa Grana, PA-C.   Chief Complaint  Patient presents with  . New Patient (Initial Visit)    Patient reports palpitations; Denies SOB, chest pain, and edema; Meds verbally reviewed with patient.    History of Present Illness:    Elizabeth Russo is a 58 y.o. female with a hx of hypertension, CAD, NSTEMI with PCI to [place] January 2013), paroxysmal atrial fibrillation who presents to establish care.  Patient used to follow-up at [facility].  She had bilateral arm and neck pain followed by dyspnea on exertion in January 2013.  She was diagnosed with NSTEMI.  Left heart cath was performed which showed an occluded mid RCA, 90% stenosis in a small OM1 branch.  50% proximal RCA, distal LAD large OM 2.  PCI was performed to the OM 2 branch with a drug-eluting stent.  In 2018, she had a stress echocardiogram, exercising for 6 minutes achieving 10.1 METS with no wall motion abnormalities.  Last echocardiogram in 2013 showed EF of 45%, with mild concentric LVH.  She has a history of myalgia on statin.  She is currently taking Zetia and Praluent for hyperlipidemia.  She denies any symptoms of chest pain.  She states having palpitations on and off for about 2 years now.  Symptoms occur daily, typically lasts a couple of seconds then go away.  Not associated with any dizziness, presyncope or syncope.  Past Medical History:  Diagnosis Date  . Breast mass, right 04/25/2014  . CAD (coronary artery disease)   . Diabetes mellitus without complication ([place])   . Diverticulosis   . Family history of colon cancer in father   . Hypertension   . Hypothyroid    . Localized enlarged lymph nodes 10/23/2014   cervical   . Morbid obesity ([place]) 06/26/2014  . Myalgia due to statin 04/13/2017   Intolerant to three different statins  . Personal history of colonic polyps   . Vertigo     Past Surgical History:  Procedure Laterality Date  . ABDOMINAL HYSTERECTOMY    . COLONOSCOPY WITH PROPOFOL N/A 08/13/2017   Procedure: COLONOSCOPY WITH PROPOFOL;  Surgeon: Lin Landsman, MD;  Location: [facility] ENDOSCOPY;  Service: Gastroenterology;  Laterality: N/A;  . heart stent  2012    Current Medications: Current Meds  Medication Sig  . Alirocumab (PRALUENT) 75 MG/ML SOAJ Inject 75 mg into the skin every 14 (fourteen) days.  Marland Kitchen amLODipine-valsartan (EXFORGE) 10-160 MG tablet Take 1 tablet by mouth daily.  Marland Kitchen aspirin EC 81 MG tablet Take 81 mg by mouth daily.   . carvedilol (COREG) 25 MG tablet Take 0.5 tablets (12.5 mg total) by mouth 2 (two) times daily with a meal.  . ezetimibe (ZETIA) 10 MG tablet Take 1 tablet (10 mg total) by mouth daily.  . fluticasone (FLONASE) 50 MCG/ACT nasal spray Place 2 sprays into both nostrils daily. (Patient taking differently: Place 2 sprays into both nostrils as needed. )  . glucose blood test strip Use as instructed  . Insulin Pen Needle (BD PEN NEEDLE NANO U/F) 32G X 4 MM MISC Use for insulin administration  four times daily.  Marland Kitchen levothyroxine (SYNTHROID) 150 MCG tablet Take 1 tablet by mouth once daily on Monday, Wednesday and Friday and take 137 mcg on the other days.  Marland Kitchen levothyroxine (SYNTHROID, LEVOTHROID) 137 MCG tablet Take 1 tablet by mouth daily before breakfast on Tuesday, Thursday, Saturday and Sunday  . meclizine (ANTIVERT) 25 MG tablet Take 1 tablet (25 mg total) by mouth Three (3) times a day as needed for dizziness.  . metFORMIN (GLUCOPHAGE-XR) 500 MG 24 hr tablet TAKE 1 TABLET BY MOUTH ONCE DAILY WITH BREAKFAST.  Marland Kitchen VICTOZA 18 MG/3ML SOPN Inject 0.2 mLs (1.2 mg total) into the skin daily.     Allergies:    Atorvastatin, Hydralazine, Hydrochlorothiazide, Lisinopril, Pravastatin, and Rosuvastatin   Social History   Socioeconomic History  . Marital status: Widowed    Spouse name: Not on file  . Number of children: 2  . Years of education: Not on file  . Highest education level: High school graduate  Occupational History  . Not on file  Social Needs  . Financial resource Russo: Somewhat hard  . Food insecurity    Worry: Never true    Inability: Never true  . Transportation needs    Medical: No    Non-medical: No  Tobacco Use  . Smoking status: Former Smoker    Packs/day: 0.50    Years: 30.00    Pack years: 15.00    Types: Cigarettes    Start date: 01/09/1978    Quit date: 03/09/2013    Years since quitting: 5.6  . Smokeless tobacco: Never Used  Substance and Sexual Activity  . Alcohol use: Yes    Alcohol/week: 0.0 standard drinks    Comment: Socially   . Drug use: No  . Sexual activity: Yes    Birth control/protection: Post-menopausal  Lifestyle  . Physical activity    Days per week: 6 days    Minutes per session: 30 min  . Stress: Not at all  Relationships  . Social connections    Talks on phone: More than three times a week    Gets together: More than three times a week    Attends religious service: More than 4 times per year    Active member of club or organization: No    Attends meetings of clubs or organizations: Never    Relationship status: Widowed  Other Topics Concern  . Not on file  Social History Narrative  . Not on file     Family History: The patient's family history includes Arthritis in her sister; Cancer in her father and maternal grandfather; Diabetes in her father, mother, sister, and sister; Heart attack in her paternal grandfather; Heart disease in her mother, sister, sister, and sister; Hyperlipidemia in her mother and sister; Hypertension in her father, mother, and sister; Kidney disease in her sister; Thyroid disease in her mother.  ROS:    Please see the history of present illness.     All other systems reviewed and are negative.  EKGs/Labs/Other Studies Reviewed:    The following studies were reviewed today:  Stress echocardiogram 09/20/2016 INDETERMINATE. NORMAL LA PRESSURES WITH DIASTOLIC DYSFUNCTION VALVULAR REGURGITATION: TRIVIAL MR, TRIVIAL PR NO VALVULAR STENOSIS Note: NO WMA AT SUBTARGET HR OF 126bpm(TARGET HR=140bpm) Maximum workload of 10.10 METs was achieved during exercise. RESTING HYPERTENSION - APPROPRIATE RESPONSE  02/2011 2D Echo: EF 45%, mild concentric LVH, DD gr. I, LAE 4.2cm,mild TR (PRVP 55mmHg), ascending aorta dilated to 3.9cm.  01/2016 Stress Echo: EF>55%, moderate LVH, DD  gr. I, No VHD  Jan. 2013 Cardiac Cath: occluded mRCA, 90% stenosis in sm. OM1 and Large OM2, 50% stenosis in pRCA, dLAD and Large OM2. PCI of the OM2 w/ DES.    EKG:  EKG is  ordered today.  The ekg ordered today demonstrates sinus bradycardia, heart rate 56, cannot rule out anterior infarct.  Recent Labs: 11/08/2018: ALT 16; BUN 9; Creat 0.86; Hemoglobin 13.3; Platelets 223; Potassium 3.8; Sodium 139; TSH 2.89  Recent Lipid Panel    Component Value Date/Time   CHOL 122 11/08/2018 0000   CHOL 184 05/04/2015 0858   TRIG 98 11/08/2018 0000   HDL 37 (L) 11/08/2018 0000   HDL 38 (L) 05/04/2015 0858   CHOLHDL 3.3 11/08/2018 0000   VLDL 21 04/04/2016 0932   LDLCALC 67 11/08/2018 0000    Physical Exam:    VS:  BP 124/80 (BP Location: Right Arm, Patient Position: Sitting, Cuff Size: Large)   Pulse (!) 56   Temp (!) 97 F (36.1 C)   Ht 5\' 4"  (1.626 m)   Wt 263 lb 4 oz (119.4 kg)   LMP  (LMP Unknown)   SpO2 98%   BMI 45.19 kg/m     Wt Readings from Last 3 Encounters:  11/18/18 263 lb 4 oz (119.4 kg)  11/08/18 262 lb 11.2 oz (119.2 kg)  07/23/18 261 lb 11.2 oz (118.7 kg)     GEN:  Well nourished, well developed in no acute distress, obese HEENT: Normal NECK: No JVD; No carotid bruits LYMPHATICS: No  lymphadenopathy CARDIAC: RRR, no murmurs, rubs, gallops RESPIRATORY:  Clear to auscultation without rales, wheezing or rhonchi  ABDOMEN: Soft, non-tender, non-distended MUSCULOSKELETAL:  No edema; No deformity  SKIN: Warm and dry NEUROLOGIC:  Alert and oriented x 3 PSYCHIATRIC:  Normal affect   ASSESSMENT:   Patient with history of CAD/PCI, mildly reduced ejection fraction with EF of 45%.  Intermittent palpitations noted for the past 2 years.  This could be premature beats, supraventricular tachycardia including atrial fibrillation.  1. Palpitations   2. Heart failure with reduced ejection fraction ([place])   3. Coronary artery disease involving native heart, angina presence unspecified, unspecified vessel or lesion type   4. 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 with body mass index (BMI) of 45.0 to 49.9 in adult [facility])    PLAN:    In order of problems listed above:  1. Place Zio patch x2 weeks 2. Get repeat echocardiogram.  Continue Coreg and valsartan as currently prescribed. 3. Continue aspirin, Praluent due to history of CAD and drug-eluting stent to OM1. 4. Weight loss advised.  Follow-up after above tests.  Total encounter time more than 60 minutes  Greater than 50% was spent in counseling and coordination of care with the patient   This note was generated in part or whole with voice recognition software. Voice recognition is usually quite accurate but there are transcription errors that can and very often do occur. I apologize for any typographical errors that were not detected and corrected.    Medication Adjustments/Labs and Tests Ordered: Current medicines are reviewed at length with the patient today.  Concerns regarding medicines are outlined above.  Orders Placed This Encounter  Procedures  . LONG TERM MONITOR (3-14 DAYS)  . EKG 12-Lead  . ECHOCARDIOGRAM COMPLETE   No orders of the defined types were placed in this encounter.   Patient  Instructions  Medication Instructions:  Your physician recommends that you continue on your current medications  as directed. Please refer to the Current Medication list given to you today.  *If you need a refill on your cardiac medications before your next appointment, please call your pharmacy*  Lab Work: none If you have labs (blood work) drawn today and your tests are completely normal, you will receive your results only by: Marland Kitchen MyChart Message (if you have MyChart) OR . A paper copy in the mail If you have any lab test that is abnormal or we need to change your treatment, we will call you to review the results.  Testing/Procedures: Your physician has requested that you have an echocardiogram. Echocardiography is a painless test that uses sound waves to create images of your heart. It provides your doctor with information about the size and shape of your heart and how well your heart's chambers and valves are working. This procedure takes approximately one hour. There are no restrictions for this procedure. You may get an IV, if needed, to receive an ultrasound enhancing agent through to better visualize your heart.    Your physician has recommended that you wear an 14 DAY ZIO event monitor. Event monitors are medical devices that record the heart's electrical activity. Doctors most often [place] these monitors to diagnose arrhythmias. Arrhythmias are problems with the speed or rhythm of the heartbeat. The monitor is a small, portable device. You can wear one while you do your normal daily activities. This is usually used to diagnose what is causing palpitations/syncope (passing out). A Zio Patch Event Heart monitor will be applied to your chest today.  You will wear the patch for 14 days. After 24 hours, you may shower with the heart monitor on. If you feel any PALPITATIONS, you may press and release the button in the middle of the monitor.   Follow-Up: At [facility], you and your health  needs are our priority.  As part of our continuing mission to provide you with exceptional heart care, we have created designated Provider Care Teams.  These Care Teams include your primary Cardiologist (physician) and Advanced Practice Providers (APPs -  Physician Assistants and Nurse Practitioners) who all work together to provide you with the care you need, when you need it.  Your next appointment:   2 months  The format for your next appointment:   In Person  Provider:    You may see Kate Sable, MD or one of the following Advanced Practice Providers on your designated Care Team:    Murray Hodgkins, NP  Christell Faith, PA-C  Marrianne Mood, PA-C     Signed, Kate Sable, MD  11/18/2018 [place]

## 2018-11-18 NOTE — Patient Instructions (Signed)
Medication Instructions:  Your physician recommends that you continue on your current medications as directed. Please refer to the Current Medication list given to you today.  *If you need a refill on your cardiac medications before your next appointment, please call your pharmacy*  Lab Work: none If you have labs (blood work) drawn today and your tests are completely normal, you will receive your results only by: Marland Kitchen MyChart Message (if you have MyChart) OR . A paper copy in the mail If you have any lab test that is abnormal or we need to change your treatment, we will call you to review the results.  Testing/Procedures: Your physician has requested that you have an echocardiogram. Echocardiography is a painless test that uses sound waves to create images of your heart. It provides your doctor with information about the size and shape of your heart and how well your heart's chambers and valves are working. This procedure takes approximately one hour. There are no restrictions for this procedure. You may get an IV, if needed, to receive an ultrasound enhancing agent through to better visualize your heart.    Your physician has recommended that you wear an 14 DAY ZIO event monitor. Event monitors are medical devices that record the heart's electrical activity. Doctors most often [place] these monitors to diagnose arrhythmias. Arrhythmias are problems with the speed or rhythm of the heartbeat. The monitor is a small, portable device. You can wear one while you do your normal daily activities. This is usually used to diagnose what is causing palpitations/syncope (passing out). A Zio Patch Event Heart monitor will be applied to your chest today.  You will wear the patch for 14 days. After 24 hours, you may shower with the heart monitor on. If you feel any PALPITATIONS, you may press and release the button in the middle of the monitor.   Follow-Up: At [facility], you and your health needs are our  priority.  As part of our continuing mission to provide you with exceptional heart care, we have created designated Provider Care Teams.  These Care Teams include your primary Cardiologist (physician) and Advanced Practice Providers (APPs -  Physician Assistants and Nurse Practitioners) who all work together to provide you with the care you need, when you need it.  Your next appointment:   2 months  The format for your next appointment:   In Person  Provider:    You may see Kate Sable, MD or one of the following Advanced Practice Providers on your designated Care Team:    Murray Hodgkins, NP  Christell Faith, PA-C  Marrianne Mood, PA-C

## 2018-12-17 ENCOUNTER — Other Ambulatory Visit: Payer: Self-pay

## 2018-12-17 ENCOUNTER — Ambulatory Visit (INDEPENDENT_AMBULATORY_CARE_PROVIDER_SITE_OTHER): Payer: BC Managed Care – PPO

## 2018-12-17 DIAGNOSIS — I5020 Unspecified systolic (congestive) heart failure: Secondary | ICD-10-CM

## 2018-12-17 DIAGNOSIS — I2510 Atherosclerotic heart disease of native coronary artery without angina pectoris: Secondary | ICD-10-CM

## 2018-12-20 ENCOUNTER — Telehealth: Payer: Self-pay

## 2018-12-20 DIAGNOSIS — I471 Supraventricular tachycardia: Secondary | ICD-10-CM

## 2018-12-20 NOTE — Telephone Encounter (Signed)
Called to give the patient cardiac monitor results and Dr. Eddie North recommendation. [email or web address].

## 2018-12-20 NOTE — Telephone Encounter (Signed)
-----   Message from Kate Sable, MD sent at 12/20/2018  8:07 AM EST ----- Frequent episodes of SVTs.  Patient currently on Coreg 25 twice daily.  Please schedule patient to see EP for input due to SVTs.  Thanks

## 2018-12-24 NOTE — Telephone Encounter (Signed)
-----   Message from Kate Sable, MD sent at 12/20/2018  8:07 AM EST ----- Frequent episodes of SVTs.  Patient currently on Coreg 25 twice daily.  Please schedule patient to see EP for input due to SVTs.  Thanks

## 2018-12-24 NOTE — Telephone Encounter (Signed)
Patient made aware of cardiac monitor results and Dr. Thereasa Solo recommendation. Patient is agreeable with having the consult with EP Dr. Caryl Comes. Advised the patient that I will fwd the message to contact her to schedule.

## 2019-01-23 ENCOUNTER — Encounter: Payer: Self-pay | Admitting: Cardiology

## 2019-01-23 ENCOUNTER — Other Ambulatory Visit: Payer: Self-pay

## 2019-01-23 ENCOUNTER — Ambulatory Visit (INDEPENDENT_AMBULATORY_CARE_PROVIDER_SITE_OTHER): Payer: BC Managed Care – PPO | Admitting: Cardiology

## 2019-01-23 VITALS — BP 126/82 | HR 61 | Ht 64.0 in | Wt 266.8 lb

## 2019-01-23 DIAGNOSIS — I10 Essential (primary) hypertension: Secondary | ICD-10-CM

## 2019-01-23 DIAGNOSIS — I2510 Atherosclerotic heart disease of native coronary artery without angina pectoris: Secondary | ICD-10-CM

## 2019-01-23 DIAGNOSIS — I471 Supraventricular tachycardia: Secondary | ICD-10-CM | POA: Diagnosis not present

## 2019-01-23 MED ORDER — VERAPAMIL HCL ER 120 MG PO TBCR: 120.0000 mg | EXTENDED_RELEASE_TABLET | Freq: Every day | ORAL | 3 refills | Status: DC

## 2019-01-23 MED ORDER — VALSARTAN 160 MG PO TABS: 160.0000 mg | ORAL_TABLET | Freq: Every day | ORAL | 3 refills | Status: DC

## 2019-01-23 NOTE — Patient Instructions (Signed)
Medication Instructions:  Your physician has recommended you make the following change in your medication: Stop Exforge. Start Valsartan 60 mg daily Start Verapamil CR 120 mg daily.  *If you need a refill on your cardiac medications before your next appointment, please call your pharmacy*  Lab Work: None ordered If you have labs (blood work) drawn today and your tests are completely normal, you will receive your results only by: Marland Kitchen MyChart Message (if you have MyChart) OR . A paper copy in the mail If you have any lab test that is abnormal or we need to change your treatment, we will call you to review the results.  Testing/Procedures: None ordered.  Follow-Up: At [facility], you and your health needs are our priority.  As part of our continuing mission to provide you with exceptional heart care, we have created designated Provider Care Teams.  These Care Teams include your primary Cardiologist (physician) and Advanced Practice Providers (APPs -  Physician Assistants and Nurse Practitioners) who all work together to provide you with the care you need, when you need it.  Your next appointment:   01/30/19 at 10:00 with Dr. Gari Crown need to schedule a 3 month   The format for your next appointment:   In Person  Provider:   Kate Sable, MD  Other Instructions NA

## 2019-01-23 NOTE — Progress Notes (Signed)
Cardiology Office Note:    Date:  01/23/2019   ID:  Elizabeth Russo, DOB 07/18/1960, MRN [id number]  PCP:  Delsa Grana, PA-C  Cardiologist:  Kate Sable, MD  Electrophysiologist:  None   Referring MD: Delsa Grana, PA-C   Elizabeth Russo is a 59 y.o. female who is being seen today for the evaluation of CAD and palpitations at the request of Delsa Grana, PA-C.   Chief Complaint  Patient presents with  . office visit    Pt f/u [facility]. SOB irregular heart beat swelling in ankles and feet. Meds verbally reviewed w/ pt.    History of Present Illness:    Elizabeth Russo is a 59 y.o. female with a hx of hypertension, CAD, NSTEMI with PCI to [facility] January 2013), paroxysmal atrial fibrillation who presents for follow-up..  Patient used to follow-up at [facility].  She had bilateral arm and neck pain followed by dyspnea on exertion in January 2013.  She was diagnosed with NSTEMI.  Left heart cath was performed which showed an occluded mid RCA, 90% stenosis in a small OM1 branch.  50% proximal RCA, distal LAD large OM 2.  PCI was performed to the OM 2 branch with a drug-eluting stent.  In 2018, she had a stress echocardiogram, exercising for 6 minutes achieving 10.1 METS with no wall motion abnormalities.  Last echocardiogram in 2013 showed EF of 45%, with mild concentric LVH.  She has a history of myalgia on statin.  She is currently taking Zetia and Praluent for hyperlipidemia.  She denies any symptoms of chest pain.  She states having palpitations on and off for about 2 years.  Symptoms occur daily, typically lasts a couple of seconds then go away.  Not associated with any dizziness, presyncope or syncope.  Cardiac monitor and echocardiogram was ordered.  Patient presents for results.  She still has occasional irregular heartbeat/palpitations.  Past Medical History:  Diagnosis Date  . Breast mass, right 04/25/2014  . CAD (coronary artery disease)   . Diabetes mellitus without  complication ([place])   . Diverticulosis   . Family history of colon cancer in father   . Hypertension   . Hypothyroid   . Localized enlarged lymph nodes 10/23/2014   cervical   . Morbid obesity ([place]) 06/26/2014  . Myalgia due to statin 04/13/2017   Intolerant to three different statins  . Personal history of colonic polyps   . Vertigo     Past Surgical History:  Procedure Laterality Date  . ABDOMINAL HYSTERECTOMY    . COLONOSCOPY WITH PROPOFOL N/A 08/13/2017   Procedure: COLONOSCOPY WITH PROPOFOL;  Surgeon: Lin Landsman, MD;  Location: [facility] ENDOSCOPY;  Service: Gastroenterology;  Laterality: N/A;  . heart stent  2012    Current Medications: Current Meds  Medication Sig  . Alirocumab (PRALUENT) 75 MG/ML SOAJ Inject 75 mg into the skin every 14 (fourteen) days.  Marland Kitchen aspirin EC 81 MG tablet Take 81 mg by mouth daily.   . carvedilol (COREG) 25 MG tablet Take 0.5 tablets (12.5 mg total) by mouth 2 (two) times daily with a meal.  . diclofenac sodium (VOLTAREN) 1 % GEL Apply 2 g topically as needed.   . ezetimibe (ZETIA) 10 MG tablet Take 1 tablet (10 mg total) by mouth daily.  . fluticasone (FLONASE) 50 MCG/ACT nasal spray Place 2 sprays into both nostrils daily. (Patient taking differently: Place 2 sprays into both nostrils as needed. )  . glucose blood test strip Use  as instructed  . Insulin Pen Needle (BD PEN NEEDLE NANO U/F) 32G X 4 MM MISC Use for insulin administration four times daily.  Marland Kitchen levothyroxine (SYNTHROID) 150 MCG tablet Take 1 tablet by mouth once daily on Monday, Wednesday and Friday and take 137 mcg on the other days.  Marland Kitchen levothyroxine (SYNTHROID, LEVOTHROID) 137 MCG tablet Take 1 tablet by mouth daily before breakfast on Tuesday, Thursday, Saturday and Sunday  . meclizine (ANTIVERT) 25 MG tablet Take 1 tablet (25 mg total) by mouth Three (3) times a day as needed for dizziness.  . metFORMIN (GLUCOPHAGE-XR) 500 MG 24 hr tablet TAKE 1 TABLET BY MOUTH ONCE DAILY WITH  BREAKFAST.  Marland Kitchen VICTOZA 18 MG/3ML SOPN Inject 0.2 mLs (1.2 mg total) into the skin daily.  . [DISCONTINUED] amLODipine-valsartan (EXFORGE) 10-160 MG tablet Take 1 tablet by mouth daily.     Allergies:   Atorvastatin, Hydralazine, Hydrochlorothiazide, Lisinopril, Pravastatin, and Rosuvastatin   Social History   Socioeconomic History  . Marital status: Widowed    Spouse name: Not on file  . Number of children: 2  . Years of education: Not on file  . Highest education level: High school graduate  Occupational History  . Not on file  Tobacco Use  . Smoking status: Former Smoker    Packs/day: 0.50    Years: 30.00    Pack years: 15.00    Types: Cigarettes    Start date: 01/09/1978    Quit date: 03/09/2013    Years since quitting: 5.8  . Smokeless tobacco: Never Used  Substance and Sexual Activity  . Alcohol use: Yes    Alcohol/week: 0.0 standard drinks    Comment: Socially   . Drug use: No  . Sexual activity: Yes    Birth control/protection: Post-menopausal  Other Topics Concern  . Not on file  Social History Narrative  . Not on file   Social Determinants of Health   Financial Resource Strain:   . Difficulty of Paying Living Expenses: Not on file  Food Insecurity:   . Worried About Charity fundraiser in the Last Year: Not on file  . Ran Out of Food in the Last Year: Not on file  Transportation Needs:   . Lack of Transportation (Medical): Not on file  . Lack of Transportation (Non-Medical): Not on file  Physical Activity:   . Days of Exercise per Week: Not on file  . Minutes of Exercise per Session: Not on file  Stress:   . Feeling of Stress : Not on file  Social Connections:   . Frequency of Communication with Friends and Family: Not on file  . Frequency of Social Gatherings with Friends and Family: Not on file  . Attends Religious Services: Not on file  . Active Member of Clubs or Organizations: Not on file  . Attends Archivist Meetings: Not on file  .  Marital Status: Not on file     Family History: The patient's family history includes Arthritis in her sister; Cancer in her father and maternal grandfather; Diabetes in her father, mother, sister, and sister; Heart attack in her paternal grandfather; Heart disease in her mother, sister, sister, and sister; Hyperlipidemia in her mother and sister; Hypertension in her father, mother, and sister; Kidney disease in her sister; Thyroid disease in her mother.  ROS:   Please see the history of present illness.     All other systems reviewed and are negative.  EKGs/Labs/Other Studies Reviewed:    The following  studies were reviewed today:  TTE 12/17/2018  1. Left ventricular ejection fraction, by visual estimation, is 60 to 65%. The left ventricle has normal function. There is mildly increased left ventricular hypertrophy.  2. Left ventricular diastolic parameters are consistent with Grade I diastolic dysfunction (impaired relaxation).  3. Mildly dilated left ventricular internal cavity size.  4. The left ventricle has no regional wall motion abnormalities.  5. Global right ventricle has normal systolic function.The right ventricular size is normal. No increase in right ventricular wall thickness.  6. Left atrial size was normal.  7. Right atrial size was normal.  8. Trivial pericardial effusion is present.  9. The mitral valve is normal in structure. Trace mitral valve regurgitation. 10. The tricuspid valve is normal in structure. Tricuspid valve regurgitation is mild. 11. The aortic valve was not well visualized. Aortic valve regurgitation is not visualized. No evidence of aortic valve sclerosis or stenosis. 12. The pulmonic valve was grossly normal. Pulmonic valve regurgitation is trivial. 13. Normal pulmonary artery systolic pressure. 14. The inferior vena cava is normal in size with <50% respiratory variability, suggesting right atrial pressure of 8 mmHg. 15. The interatrial septum was not  well visualized.  Cardiac monitor 11/18/2018 Patient had a min HR of 50 bpm, max HR of 190 bpm, and avg HR of 66 bpm. Predominant underlying rhythm was Sinus Rhythm. Slight P wave morphology changes were noted. 46 Supraventricular Tachycardia runs occurred, the run with the fastest interval lasting 4 beats with a max rate of 190 bpm, the longest lasting 14.6 secs (26 beats) with an avg rate of 141 bpm. Some episodes of Supraventricular Tachycardia may be possible Atrial Tachycardia with variable block. Supraventricular Tachycardia was detected within +/- 45 seconds of symptomatic patient event(s). Isolated SVEs were rare (<1.0%), SVE Couplets were rare (<1.0%), and SVE Triplets were rare (<1.0%).  Stress echocardiogram 09/20/2016 INDETERMINATE. NORMAL LA PRESSURES WITH DIASTOLIC DYSFUNCTION VALVULAR REGURGITATION: TRIVIAL MR, TRIVIAL PR NO VALVULAR STENOSIS Note: NO WMA AT SUBTARGET HR OF 126bpm(TARGET HR=140bpm) Maximum workload of 10.10 METs was achieved during exercise. RESTING HYPERTENSION - APPROPRIATE RESPONSE  02/2011 2D Echo: EF 45%, mild concentric LVH, DD gr. I, LAE 4.2cm,mild TR (PRVP 44mmHg), ascending aorta dilated to 3.9cm.  01/2016 Stress Echo: EF>55%, moderate LVH, DD gr. I, No VHD  Jan. 2013 Cardiac Cath: occluded mRCA, 90% stenosis in sm. OM1 and Large OM2, 50% stenosis in pRCA, dLAD and Large OM2. PCI of the OM2 w/ DES.    EKG:  EKG is  ordered today.  The ekg ordered today demonstrates normal sinus rhythm, heart rate 61, nonspecific ST changes.  Recent Labs: 11/08/2018: ALT 16; BUN 9; Creat 0.86; Hemoglobin 13.3; Platelets 223; Potassium 3.8; Sodium 139; TSH 2.89  Recent Lipid Panel    Component Value Date/Time   CHOL 122 11/08/2018 0000   CHOL 184 05/04/2015 0858   TRIG 98 11/08/2018 0000   HDL 37 (L) 11/08/2018 0000   HDL 38 (L) 05/04/2015 0858   CHOLHDL 3.3 11/08/2018 0000   VLDL 21 04/04/2016 0932   LDLCALC 67 11/08/2018 0000    Physical Exam:     VS:  BP 126/82 (BP Location: Left Arm, Patient Position: Sitting, Cuff Size: Normal)   Pulse 61   Ht 5\' 4"  (1.626 m)   Wt 266 lb 12 oz (121 kg)   LMP  (LMP Unknown)   SpO2 98%   BMI 45.79 kg/m     Wt Readings from Last 3 Encounters:  01/23/19 266 lb  12 oz (121 kg)  11/18/18 263 lb 4 oz (119.4 kg)  11/08/18 262 lb 11.2 oz (119.2 kg)     GEN:  Well nourished, well developed in no acute distress, obese HEENT: Normal NECK: No JVD; No carotid bruits LYMPHATICS: No lymphadenopathy CARDIAC: RRR, no murmurs, rubs, gallops RESPIRATORY:  Clear to auscultation without rales, wheezing or rhonchi  ABDOMEN: Soft, non-tender, non-distended MUSCULOSKELETAL:  No edema; No deformity  SKIN: Warm and dry NEUROLOGIC:  Alert and oriented x 3 PSYCHIATRIC:  Normal affect   ASSESSMENT:   Echocardiogram shows normal ejection fraction with EF 60 to 123456, grade 1 diastolic function, mild LV dilatation, mild LVH.  Cardiac monitor shows frequent SVTs with longest interval lasting 26 beats.  1. SVT (supraventricular tachycardia) ([place])   2. Essential hypertension   3. Coronary artery disease involving native heart, angina presence unspecified, unspecified vessel or lesion type    PLAN:    In order of problems listed above:  1. Start verapamil extended release 120 mg daily.  Stop amlodipine.  Keep follow-up appointment with electrophysiology next week for additional input.   2. Continue Coreg and valsartan as currently prescribed.  Verapamil as above. 3. Continue aspirin, Praluent due to history of CAD and drug-eluting stent to OM1.   Follow-up in 3 months.  Total encounter time more than 35 minutes  Greater than 50% was spent in counseling and coordination of care with the patient   This note was generated in part or whole with voice recognition software. Voice recognition is usually quite accurate but there are transcription errors that can and very often do occur. I apologize for any  typographical errors that were not detected and corrected.   Medication Adjustments/Labs and Tests Ordered: Current medicines are reviewed at length with the patient today.  Concerns regarding medicines are outlined above.  Orders Placed This Encounter  Procedures  . EKG 12-Lead   Meds ordered this encounter  Medications  . valsartan (DIOVAN) 160 MG tablet    Sig: Take 1 tablet (160 mg total) by mouth daily.    Dispense:  90 tablet    Refill:  3  . verapamil (CALAN-SR) 120 MG CR tablet    Sig: Take 1 tablet (120 mg total) by mouth at bedtime.    Dispense:  30 tablet    Refill:  3    Patient Instructions  Medication Instructions:  Your physician has recommended you make the following change in your medication: Stop Exforge. Start Valsartan 60 mg daily Start Verapamil CR 120 mg daily.  *If you need a refill on your cardiac medications before your next appointment, please call your pharmacy*  Lab Work: None ordered If you have labs (blood work) drawn today and your tests are completely normal, you will receive your results only by: Marland Kitchen MyChart Message (if you have MyChart) OR . A paper copy in the mail If you have any lab test that is abnormal or we need to change your treatment, we will call you to review the results.  Testing/Procedures: None ordered.  Follow-Up: At [facility], you and your health needs are our priority.  As part of our continuing mission to provide you with exceptional heart care, we have created designated Provider Care Teams.  These Care Teams include your primary Cardiologist (physician) and Advanced Practice Providers (APPs -  Physician Assistants and Nurse Practitioners) who all work together to provide you with the care you need, when you need it.  Your next appointment:  01/30/19 at 10:00 with Dr. Gari Crown need to schedule a 3 month   The format for your next appointment:   In Person  Provider:   Kate Sable, MD  Other  Instructions NA     Signed, Kate Sable, MD  01/23/2019 12:18 PM    [place]

## 2019-01-29 NOTE — Progress Notes (Signed)
d     ELECTROPHYSIOLOGY CONSULT NOTE  Patient ID: Elizabeth Russo, MRN: [id number], DOB/AGE: Oct 14, 1960 59 y.o. Admit date: (Not on file) Date of Consult: 01/30/2019  Primary Physician: Delsa Grana, PA-C Primary Cardiologist: [facility]     Elizabeth Russo is a 59 y.o. female who is being seen today for the evaluation of palpitations and SVT at the request of BAE.    HPI Elizabeth Russo is a 59 y.o. female  Referred for nonsustained atrial tachycardia identified on an event recorder for palpitations  Palpitations are modestly bothersome, last just a few seconds assoc with some LH  Baseline dyspnea which has been worse over recent months, unassoc with chest pain.. No nocturnal dyspnea, 2 pillow orthopnea t some edema  Sleep disordered breathing and snores  Her  Husband passed away about 5 years ago, two nearby daughters     Hx of premature CAD, with NSTEMI and stenting 2013    Dyslipidemia, statin intolerant on PCSK9  DATE TEST EF   1/13 LHC ([facility])  45 % LADd-50% Cx-OM2 90>>0; RCA-Total  1/18 Echo-S (D) 55%   2/18 Myoview (D)  No Ischemia  12/20 Echo  60-65%    Event Recorder personnally reviewed " 46 Supraventricular Tachycardia runs occurred, the run with the fastest interval lasting 4 beats with a max rate of 190 bpm, the longest lasting 14.6 secs with an avg rate of 141 bpm."     Past Medical History:  Diagnosis Date  . Breast mass, right 04/25/2014  . CAD (coronary artery disease)   . Diabetes mellitus without complication ([place])   . Diverticulosis   . Family history of colon cancer in father   . Hypertension   . Hypothyroid   . Localized enlarged lymph nodes 10/23/2014   cervical   . Morbid obesity ([place]) 06/26/2014  . Myalgia due to statin 04/13/2017   Intolerant to three different statins  . Personal history of colonic polyps   . Vertigo       Surgical History:  Past Surgical History:  Procedure Laterality Date  . ABDOMINAL HYSTERECTOMY    . COLONOSCOPY  WITH PROPOFOL N/A 08/13/2017   Procedure: COLONOSCOPY WITH PROPOFOL;  Surgeon: Lin Landsman, MD;  Location: [facility] ENDOSCOPY;  Service: Gastroenterology;  Laterality: N/A;  . heart stent  2012     Home Meds: Current Meds  Medication Sig  . Alirocumab (PRALUENT) 75 MG/ML SOAJ Inject 75 mg into the skin every 14 (fourteen) days.  Marland Kitchen aspirin EC 81 MG tablet Take 81 mg by mouth daily.   . carvedilol (COREG) 25 MG tablet Take 0.5 tablets (12.5 mg total) by mouth 2 (two) times daily with a meal.  . diclofenac sodium (VOLTAREN) 1 % GEL Apply 2 g topically as needed.   . ezetimibe (ZETIA) 10 MG tablet Take 1 tablet (10 mg total) by mouth daily.  . fluticasone (FLONASE) 50 MCG/ACT nasal spray Place 2 sprays into both nostrils daily. (Patient taking differently: Place 2 sprays into both nostrils as needed. )  . glucose blood test strip Use as instructed  . Insulin Pen Needle (BD PEN NEEDLE NANO U/F) 32G X 4 MM MISC Use for insulin administration four times daily.  Marland Kitchen levothyroxine (SYNTHROID) 150 MCG tablet Take 1 tablet by mouth once daily on Monday, Wednesday and Friday and take 137 mcg on the other days.  Marland Kitchen levothyroxine (SYNTHROID, LEVOTHROID) 137 MCG tablet Take 1 tablet by mouth daily before breakfast on Tuesday, Thursday, Saturday and Sunday  .  meclizine (ANTIVERT) 25 MG tablet Take 1 tablet (25 mg total) by mouth Three (3) times a day as needed for dizziness.  . metFORMIN (GLUCOPHAGE-XR) 500 MG 24 hr tablet TAKE 1 TABLET BY MOUTH ONCE DAILY WITH BREAKFAST.  . valsartan (DIOVAN) 160 MG tablet Take 1 tablet (160 mg total) by mouth daily.  . verapamil (CALAN-SR) 120 MG CR tablet Take 1 tablet (120 mg total) by mouth at bedtime.  Marland Kitchen VICTOZA 18 MG/3ML SOPN Inject 0.2 mLs (1.2 mg total) into the skin daily.    Allergies:  Allergies  Allergen Reactions  . Atorvastatin Other (See Comments)    Other reaction(s): Muscle Pain  . Hydralazine Other (See Comments)    Aggravated gout Aggravated gout    . Hydrochlorothiazide Other (See Comments)    Aggravated gout  . Lisinopril Cough  . Pravastatin Other (See Comments)    Other reaction(s): Muscle Pain  . Rosuvastatin Other (See Comments)    Other reaction(s): Muscle Pain    Social History   Socioeconomic History  . Marital status: Widowed    Spouse name: Not on file  . Number of children: 2  . Years of education: Not on file  . Highest education level: High school graduate  Occupational History  . Not on file  Tobacco Use  . Smoking status: Former Smoker    Packs/day: 0.50    Years: 30.00    Pack years: 15.00    Types: Cigarettes    Start date: 01/09/1978    Quit date: 03/09/2013    Years since quitting: 5.8  . Smokeless tobacco: Never Used  Substance and Sexual Activity  . Alcohol use: Yes    Alcohol/week: 0.0 standard drinks    Comment: Socially   . Drug use: No  . Sexual activity: Yes    Birth control/protection: Post-menopausal  Other Topics Concern  . Not on file  Social History Narrative  . Not on file   Social Determinants of Health   Financial Resource Strain:   . Difficulty of Paying Living Expenses: Not on file  Food Insecurity:   . Worried About Charity fundraiser in the Last Year: Not on file  . Ran Out of Food in the Last Year: Not on file  Transportation Needs:   . Lack of Transportation (Medical): Not on file  . Lack of Transportation (Non-Medical): Not on file  Physical Activity:   . Days of Exercise per Week: Not on file  . Minutes of Exercise per Session: Not on file  Stress:   . Feeling of Stress : Not on file  Social Connections:   . Frequency of Communication with Friends and Family: Not on file  . Frequency of Social Gatherings with Friends and Family: Not on file  . Attends Religious Services: Not on file  . Active Member of Clubs or Organizations: Not on file  . Attends Archivist Meetings: Not on file  . Marital Status: Not on file  Intimate Partner Violence:   . Fear  of Current or Ex-Partner: Not on file  . Emotionally Abused: Not on file  . Physically Abused: Not on file  . Sexually Abused: Not on file     Family History  Problem Relation Age of Onset  . Heart disease Mother   . Diabetes Mother   . Hypertension Mother   . Hyperlipidemia Mother   . Thyroid disease Mother   . Cancer Father        colon  . Diabetes  Father   . Hypertension Father   . Diabetes Sister   . Hypertension Sister   . Heart disease Sister   . Hyperlipidemia Sister   . Kidney disease Sister   . Cancer Maternal Grandfather        lung  . Heart attack Paternal Grandfather   . Heart disease Sister   . Diabetes Sister   . Heart disease Sister   . Arthritis Sister      ROS:  Please see the history of present illness.     All other systems reviewed and negative.    Physical Exam: Blood pressure 134/84, pulse (!) 56, height 5\' 4"  (1.626 m), weight 265 lb 4 oz (120.3 kg), SpO2 98 %. General: Well developed, well nourished obese  female in no acute distress. Head: Normocephalic, atraumatic, sclera non-icteric, no xanthomas, nares are without discharge. EENT: normal  Lymph Nodes:  none Neck: Negative for carotid bruits. JVD 8-10 + HJR  Back:without scoliosis kyphosis Lungs: Clear bilaterally to auscultation without wheezes, rales, or rhonchi. Breathing is unlabored. Heart: RRR with S1 S2. No   murmur . No rubs, or gallops appreciated. Abdomen: Soft, non-tender, non-distended with normoactive bowel sounds. No hepatomegaly. No rebound/guarding. No obvious abdominal masses. Msk:  Strength and tone appear normal for age. Extremities: No clubbing or cyanosis. tr edema.  Distal pedal pulses are 2+ and equal bilaterally. Skin: Warm and Dry Neuro: Alert and oriented X 3. CN III-XII intact Grossly normal sensory and motor function . Psych:  Responds to questions appropriately with a normal affect.      Labs: Cardiac Enzymes No results for input(s): CKTOTAL, CKMB,  TROPONINI in the last 72 hours. CBC Lab Results  Component Value Date   WBC 5.0 11/08/2018   HGB 13.3 11/08/2018   HCT 39.1 11/08/2018   MCV 87.7 11/08/2018   PLT 223 11/08/2018   PROTIME: No results for input(s): LABPROT, INR in the last 72 hours. Chemistry No results for input(s): NA, K, CL, CO2, BUN, CREATININE, CALCIUM, PROT, BILITOT, ALKPHOS, ALT, AST, GLUCOSE in the last 168 hours.  Invalid input(s): LABALBU Lipids Lab Results  Component Value Date   CHOL 122 11/08/2018   HDL 37 (L) 11/08/2018   LDLCALC 67 11/08/2018   TRIG 98 11/08/2018   BNP No results found for: PROBNP Thyroid Function Tests: No results for input(s): TSH, T4TOTAL, T3FREE, THYROIDAB in the last 72 hours.  Invalid input(s): FREET3 Miscellaneous No results found for: DDIMER  Radiology/Studies:  No results found.  EKG: sinus @ 56+ 23/09/41   Assessment and Plan:   Atrial tachycardia-nonsustained  Palpitations unassociated with arrhythmia  Coronary artery disease with remote RCA stenting  Dyspnea on exertion-subacute  Volume overload-HFpEF  Sleep disordered breathing likely obstructive sleep apnea   The patient is atrial tachycardia is minimally bothersome.  She is worse with more lightheadedness having switched from amlodipine to verapamil.  I first thought it might have been related to the impact of the calcium blocker/beta-blocker combination on her heart rate; however, her heart rates have been in the 605 beat range for the last 5 years.  But given the paucity of symptoms, not sure that directed therapy is necessary.  We will switch her back to her amlodipine.  This is a combination pill with valsartan so we will have her hold her valsartan  As best as I can tell the old medical record which describes atrial fibrillation is without basis, meaning I do not find it in the [place] records.  She is taking her carvedilol once a day; we will have her take half twice daily.  Dyspnea on  exertion is concerning.  She is volume overloaded for sure so we will begin her on a diuretic to furosemide 20 mg daily x5 days and then every other day.  I have stressed the importance of a low-sodium diet.  However in the context of her known coronary disease this as an anginal equivalent must be excluded.  She had a Myoview scan a couple years ago.  I will discuss with Dr. Waldron Session whether CTA is appropriate with her stent in place or whether we she should go to Myoview scanning.  Sleep disordered breathing.  Hypertension.  We will arrange for an outpatient sleep study. We will see again as needed     Virl Axe

## 2019-01-30 ENCOUNTER — Encounter: Payer: Self-pay | Admitting: Internal Medicine

## 2019-01-30 ENCOUNTER — Other Ambulatory Visit: Payer: Self-pay

## 2019-01-30 ENCOUNTER — Ambulatory Visit (INDEPENDENT_AMBULATORY_CARE_PROVIDER_SITE_OTHER): Payer: BC Managed Care – PPO | Admitting: Internal Medicine

## 2019-01-30 VITALS — BP 134/84 | HR 56 | Ht 64.0 in | Wt 265.2 lb

## 2019-01-30 DIAGNOSIS — I471 Supraventricular tachycardia: Secondary | ICD-10-CM | POA: Diagnosis not present

## 2019-01-30 DIAGNOSIS — R079 Chest pain, unspecified: Secondary | ICD-10-CM

## 2019-01-30 DIAGNOSIS — R0602 Shortness of breath: Secondary | ICD-10-CM

## 2019-01-30 MED ORDER — AMLODIPINE BESYLATE-VALSARTAN 10-160 MG PO TABS: ORAL_TABLET | ORAL | Status: DC

## 2019-01-30 MED ORDER — FUROSEMIDE 20 MG PO TABS: ORAL_TABLET | ORAL | 0 refills | Status: DC

## 2019-01-30 NOTE — Patient Instructions (Signed)
Medication Instructions:  - Your physician has recommended you make the following change in your medication:   1) Make sure are you taking Coreg (Carvediolol) 25 mg- take 1/2 tablet (12.5 mg) by mouth twice daily  2) Stop verapamil  3) Stop valsartan  4) Resume amlodipine-valsartan 10/ 160 mg- take 1 tablet by mouth once daily  5) Start lasix (furosemide) 20 mg- take 1 tablet by mouth once daily x 5 days, then take 1 tablet by mouth once every other day  *If you need a refill on your cardiac medications before your next appointment, please call your pharmacy*  Lab Work: - Your physician recommends that you have lab work today: CBC  If you have labs (blood work) drawn today and your tests are completely normal, you will receive your results only by: Marland Kitchen MyChart Message (if you have MyChart) OR . A paper copy in the mail If you have any lab test that is abnormal or we need to change your treatment, we will call you to review the results.  Testing/Procedures:  - pending feedback from Dr. Garen Lah (Cardiac CT vs Chemical Stress Test)  - You have been referred to [facility] for consultation for a sleep study   Follow-Up: At [facility], you and your health needs are our priority.  As part of our continuing mission to provide you with exceptional heart care, we have created designated Provider Care Teams.  These Care Teams include your primary Cardiologist (physician) and Advanced Practice Providers (APPs -  Physician Assistants and Nurse Practitioners) who all work together to provide you with the care you need, when you need it.  Your next appointment:   As scheduled with Dr. Garen Lah  As needed with Dr. Caryl Comes  The format for your next appointment:   n/a  Provider:   n/a  Other Instructions n/a

## 2019-01-31 LAB — CBC WITH DIFFERENTIAL/PLATELET
Basophils Absolute: 0.1 10*3/uL (ref 0.0–0.2)
Basos: 1 %
EOS (ABSOLUTE): 0.3 10*3/uL (ref 0.0–0.4)
Eos: 5 %
Hematocrit: 40.6 % (ref 34.0–46.6)
Hemoglobin: 13.8 g/dL (ref 11.1–15.9)
Immature Grans (Abs): 0 10*3/uL (ref 0.0–0.1)
Immature Granulocytes: 0 %
Lymphocytes Absolute: 2.7 10*3/uL (ref 0.7–3.1)
Lymphs: 46 %
MCH: 29.5 pg (ref 26.6–33.0)
MCHC: 34 g/dL (ref 31.5–35.7)
MCV: 87 fL (ref 79–97)
Monocytes Absolute: 0.6 10*3/uL (ref 0.1–0.9)
Monocytes: 11 %
Neutrophils Absolute: 2.2 10*3/uL (ref 1.4–7.0)
Neutrophils: 37 %
Platelets: 229 10*3/uL (ref 150–450)
RBC: 4.68 x10E6/uL (ref 3.77–5.28)
RDW: 13.2 % (ref 11.7–15.4)
WBC: 5.8 10*3/uL (ref 3.4–10.8)

## 2019-02-03 ENCOUNTER — Other Ambulatory Visit: Payer: Self-pay | Admitting: Family Medicine

## 2019-02-10 ENCOUNTER — Other Ambulatory Visit: Payer: Self-pay | Admitting: Family Medicine

## 2019-02-10 ENCOUNTER — Telehealth: Payer: Self-pay | Admitting: *Deleted

## 2019-02-10 DIAGNOSIS — R0609 Other forms of dyspnea: Secondary | ICD-10-CM

## 2019-02-10 NOTE — Telephone Encounter (Signed)
I called and spoke with the patient regarding setting her up for a lexiscan myoview per Dr. Garen Lah.   The patient is agreeable with these recommendations.  She is aware I will place the order for this test to be done and scheduling will reach out to her to arrange.  She is aware I will call her back with her pre-test instructions once her stress test is scheduled.

## 2019-02-10 NOTE — Telephone Encounter (Signed)
-----   Message from Kate Sable, MD sent at 02/03/2019  8:05 AM EST ----- Please obtain Lexiscan. 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 It will be difficult to evaluate stents in small arteries with CCTA.  Thank you ----- Message ----- From: Emily Filbert, RN Sent: 01/31/2019   2:52 PM EST To: Kate Sable, MD  Please see Dr. Olin Pia office note from 1/21- he is wanting feedback from you if you feel she should have a Cardiac CT vs Lexiscan to work up her angina due to her history of stenting.   Thank you!

## 2019-02-11 ENCOUNTER — Telehealth: Payer: Self-pay | Admitting: Family Medicine

## 2019-02-11 DIAGNOSIS — K219 Gastro-esophageal reflux disease without esophagitis: Secondary | ICD-10-CM

## 2019-02-11 MED ORDER — FAMOTIDINE 20 MG PO TABS: 20.0000 mg | ORAL_TABLET | Freq: Two times a day (BID) | ORAL | 1 refills | Status: DC

## 2019-02-11 NOTE — Telephone Encounter (Signed)
I dont see on her med list

## 2019-02-11 NOTE — Telephone Encounter (Signed)
rx refill famotidine (PEPCID) 20 MG tablet [Pharmacy Med Name: famotidine 20 mg tablet (PEPCID) PHARMACY [facility] - [place], [place] Phone:  [phone]  Fax:  [phone]

## 2019-02-13 NOTE — Telephone Encounter (Signed)
Any luck trying to reach out to this patient to schedule her lexiscan?

## 2019-02-14 NOTE — Telephone Encounter (Signed)
She is scheduled 2/12- tb

## 2019-02-14 NOTE — Telephone Encounter (Signed)
[facility]  Your caregiver has ordered a Stress Test with nuclear imaging. The purpose of this test is to evaluate the blood supply to your heart muscle. This procedure is referred to as a "Non-Invasive Stress Test." This is because other than having an IV started in your vein, nothing is inserted or "invades" your body. Cardiac stress tests are done to find areas of poor blood flow to the heart by determining the extent of coronary artery disease (CAD). Some patients exercise on a treadmill, which naturally increases the blood flow to your heart, while others who are  unable to walk on a treadmill due to physical limitations have a pharmacologic/chemical stress agent called Lexiscan . This medicine will mimic walking on a treadmill by temporarily increasing your coronary blood flow.   Please note: these test may take anywhere between 2-4 hours to complete  PLEASE REPORT TO [facility] AT THE FIRST DESK WILL DIRECT YOU WHERE TO GO  Date of Procedure: Friday 02/21/19  Arrival Time for Procedure: 8:15 am  Instructions regarding medication:   _x___ : Hold diabetes medication morning of procedure (oral & insulin)  _x___:  Hold betablocker(s) night before procedure and morning of procedure (coreg)  __x__:  Hold other medications as follows: Lasix (furosemide) the morning of your test  PLEASE NOTIFY THE OFFICE AT LEAST 24 HOURS IN ADVANCE IF YOU ARE UNABLE TO KEEP YOUR APPOINTMENT.  [phone] AND  PLEASE NOTIFY NUCLEAR MEDICINE AT [facility] AT LEAST 24 HOURS IN ADVANCE IF YOU ARE UNABLE TO KEEP YOUR APPOINTMENT. [phone]  How to prepare for your Myoview test:  1. Do not eat or drink after midnight 2. No caffeine for 24 hours prior to test 3. No smoking 24 hours prior to test. 4. Your medication may be taken with water.  If your doctor stopped a medication because of this test, do not take that medication. 5. Ladies, please do not wear dresses.  Skirts or  pants are appropriate. Please wear a short sleeve shirt. 6. No perfume, cologne or lotion. 7. Wear comfortable walking shoes. No heels!     I called and spoke with the patient regarding her myoview instructions. The patient voices understanding of the above instructions and is agreeable.

## 2019-02-17 ENCOUNTER — Other Ambulatory Visit: Payer: Self-pay | Admitting: Family Medicine

## 2019-02-19 ENCOUNTER — Institutional Professional Consult (permissible substitution): Payer: BC Managed Care – PPO | Admitting: Pulmonary Disease

## 2019-02-21 ENCOUNTER — Other Ambulatory Visit: Payer: Self-pay

## 2019-02-21 ENCOUNTER — Encounter
Admission: RE | Admit: 2019-02-21 | Discharge: 2019-02-21 | Disposition: A | Discharge status: Home / Self Care | Payer: BC Managed Care – PPO | Source: Ambulatory Visit | Attending: Internal Medicine | Admitting: Internal Medicine

## 2019-02-21 DIAGNOSIS — R0600 Dyspnea, unspecified: Secondary | ICD-10-CM | POA: Insufficient documentation

## 2019-02-21 DIAGNOSIS — R0609 Other forms of dyspnea: Secondary | ICD-10-CM

## 2019-02-21 LAB — NM MYOCAR MULTI W/SPECT W/WALL MOTION / EF
LV dias vol: 101 mL (ref 46–106)
LV sys vol: 40 mL
Peak HR: 77 {beats}/min
Percent HR: 47 %
Rest HR: 54 {beats}/min
SDS: 6
SRS: 15
SSS: 19
TID: 0.89

## 2019-02-21 MED ORDER — TECHNETIUM TC 99M TETROFOSMIN IV KIT
10.0000 | PACK | Freq: Once | INTRAVENOUS | Status: AC | PRN
  Administered 2019-02-21: 10.46 via INTRAVENOUS

## 2019-02-21 MED ORDER — REGADENOSON 0.4 MG/5ML IV SOLN
0.4000 mg | Freq: Once | INTRAVENOUS | Status: AC
  Administered 2019-02-21: 0.4 mg via INTRAVENOUS
  Filled 2019-02-21: qty 5

## 2019-02-21 MED ORDER — TECHNETIUM TC 99M TETROFOSMIN IV KIT
30.0000 | PACK | Freq: Once | INTRAVENOUS | Status: AC | PRN
  Administered 2019-02-21: 31.58 via INTRAVENOUS

## 2019-02-24 ENCOUNTER — Telehealth: Payer: Self-pay

## 2019-02-24 NOTE — Telephone Encounter (Signed)
-----   Message from Elizabeth Sprang, MD sent at 02/24/2019  7:44 AM EST ----- Please Inform Patient that stress test showed no evidence of ischemia, stable normal#  heart muscle function #   Thanks

## 2019-02-24 NOTE — Telephone Encounter (Signed)
Call to patient to discuss stress test results.   No further questions or orders at this time.   Advised pt to call for any further questions or concerns.

## 2019-03-10 ENCOUNTER — Ambulatory Visit: Payer: BC Managed Care – PPO | Admitting: Family Medicine

## 2019-03-20 ENCOUNTER — Institutional Professional Consult (permissible substitution): Payer: BC Managed Care – PPO | Admitting: Pulmonary Disease

## 2019-03-21 ENCOUNTER — Other Ambulatory Visit: Payer: Self-pay | Admitting: Family Medicine

## 2019-03-21 ENCOUNTER — Other Ambulatory Visit: Payer: Self-pay

## 2019-03-21 DIAGNOSIS — I480 Paroxysmal atrial fibrillation: Secondary | ICD-10-CM

## 2019-03-21 DIAGNOSIS — I10 Essential (primary) hypertension: Secondary | ICD-10-CM

## 2019-03-21 NOTE — Telephone Encounter (Signed)
Medication: carvedilol (COREG) 25 MG tablet [id number] , Alirocumab (PRALUENT) 75 MG/ML [id number]   Has the patient contacted their pharmacy? Yes  (Agent: If no, request that the patient contact the pharmacy for the refill.) (Agent: If yes, when and what did the pharmacy advise?)  Preferred Pharmacy (with phone number or street name): [place], [place]  Phone:  [phone] Fax:  [phone]     Agent: Please be advised that RX refills may take up to 3 business days. We ask that you follow-up with your pharmacy.

## 2019-03-21 NOTE — Telephone Encounter (Signed)
Hypertension medication request:  Last office visit pertaining to hypertension:11/08/18  BP Readings from Last 3 Encounters:  01/30/19 134/84  01/23/19 126/82  11/18/18 124/80    Lab Results  Component Value Date   CREATININE 0.86 11/08/2018   BUN 9 11/08/2018   NA 139 11/08/2018   K 3.8 11/08/2018   CL 106 11/08/2018   CO2 26 11/08/2018     No follow-ups on file.  Ref Range & Units 4 mo ago 8 mo ago   Cholesterol <200 mg/dL 122  82   HDL > OR = 50 mg/dL 37Low   35Low    Triglycerides <150 mg/dL 98  130   LDL Cholesterol (Calc) mg/dL (calc) 67

## 2019-03-24 NOTE — Telephone Encounter (Signed)
Please call pharmacy to clarify meds, recently sent in and duplicate requestes

## 2019-03-25 MED ORDER — CARVEDILOL 25 MG PO TABS: 12.5000 mg | ORAL_TABLET | Freq: Two times a day (BID) | ORAL | 1 refills | Status: DC

## 2019-03-25 MED ORDER — PRALUENT 75 MG/ML ~~LOC~~ SOAJ: 75.0000 mg | SUBCUTANEOUS | 1 refills | Status: DC

## 2019-03-25 NOTE — Telephone Encounter (Signed)
Pt called back about refill for carvedilol (COREG) 25 MG tablet /Pt states she has been without meds for 3 days / please advise if this will be sent today

## 2019-03-25 NOTE — Telephone Encounter (Signed)
It's not showing it sent.  Elizabeth Russo is no longer here needs new provider

## 2019-03-31 ENCOUNTER — Encounter: Payer: Self-pay | Admitting: Family Medicine

## 2019-03-31 ENCOUNTER — Ambulatory Visit: Payer: BC Managed Care – PPO | Admitting: Family Medicine

## 2019-03-31 ENCOUNTER — Other Ambulatory Visit: Payer: Self-pay

## 2019-03-31 VITALS — BP 136/80 | HR 71 | Temp 97.9°F | Resp 14 | Ht 64.0 in | Wt 266.9 lb

## 2019-03-31 DIAGNOSIS — T466X5A Adverse effect of antihyperlipidemic and antiarteriosclerotic drugs, initial encounter: Secondary | ICD-10-CM

## 2019-03-31 DIAGNOSIS — I10 Essential (primary) hypertension: Secondary | ICD-10-CM | POA: Diagnosis not present

## 2019-03-31 DIAGNOSIS — M791 Myalgia, unspecified site: Secondary | ICD-10-CM

## 2019-03-31 DIAGNOSIS — I471 Supraventricular tachycardia: Secondary | ICD-10-CM

## 2019-03-31 DIAGNOSIS — E1169 Type 2 diabetes mellitus with other specified complication: Secondary | ICD-10-CM

## 2019-03-31 DIAGNOSIS — R002 Palpitations: Secondary | ICD-10-CM

## 2019-03-31 DIAGNOSIS — Z5181 Encounter for therapeutic drug level monitoring: Secondary | ICD-10-CM

## 2019-03-31 DIAGNOSIS — Z789 Other specified health status: Secondary | ICD-10-CM

## 2019-03-31 DIAGNOSIS — E039 Hypothyroidism, unspecified: Secondary | ICD-10-CM | POA: Diagnosis not present

## 2019-03-31 DIAGNOSIS — E782 Mixed hyperlipidemia: Secondary | ICD-10-CM | POA: Diagnosis not present

## 2019-03-31 DIAGNOSIS — E669 Obesity, unspecified: Secondary | ICD-10-CM

## 2019-03-31 DIAGNOSIS — I4719 Other supraventricular tachycardia: Secondary | ICD-10-CM | POA: Insufficient documentation

## 2019-03-31 DIAGNOSIS — R32 Unspecified urinary incontinence: Secondary | ICD-10-CM

## 2019-03-31 DIAGNOSIS — E661 Drug-induced obesity: Secondary | ICD-10-CM

## 2019-03-31 DIAGNOSIS — I5030 Unspecified diastolic (congestive) heart failure: Secondary | ICD-10-CM

## 2019-03-31 DIAGNOSIS — Z6841 Body Mass Index (BMI) 40.0 and over, adult: Secondary | ICD-10-CM

## 2019-03-31 NOTE — Progress Notes (Signed)
Name: Elizabeth Russo   MRN: [id number]    DOB: May 05, 1960   Date:03/31/2019       Progress Note  Chief Complaint  Patient presents with  . Follow-up  . Hypertension  . Hypothyroidism  . Hyperlipidemia  . Diabetes     Subjective:   Elizabeth Russo is a 59 y.o. female, presents to clinic for routine follow up on the conditions listed above.  Diabetes Mellitus Type II: Currently managing with metformin 500 mg once daily, victoza 1.2 mg mg SQ daily Pt notes good med compliance  Pt has no SE from meds. Fasting CBGs typically run low 100's, she only checks occasionally or if she does not feel well, the highest she can recall ever was 200 No hypoglycemic episodes Denies: Polyuria, polydipsia, polyphagia, vision changes, or neuropathy Recent pertinent labs: Lab Results  Component Value Date   HGBA1C 6.0 (H) 11/08/2018   HGBA1C 5.9 (A) 04/22/2018   HGBA1C 5.9 04/22/2018   HGBA1C 5.9 04/22/2018   HGBA1C 5.9 04/22/2018  Pt is due DM foot exam and due eye exam ACEI/ARB: Yes Statin:  On praluent and zetia - statin intolerant   Hyperlipidemia: Current Medication Regimen:  She is currently taking Zetia and Praluent for hyperlipidemia. Last Lipids: Lab Results  Component Value Date   CHOL 122 11/08/2018   HDL 37 (L) 11/08/2018   LDLCALC 67 11/08/2018   TRIG 98 11/08/2018   CHOLHDL 3.3 11/08/2018  - Current Diet:  No particular diet for HLD - Denies: Chest pain, shortness of breath, myalgias.  (SOB has improved)  - Risk factors for atherosclerosis: arteriosclerotic heart disease, diabetes mellitus, hypercholesterolemia and hypertension  PT sees Dr. Garen Lah and Dr. Caryl Comes for cardiology/EP Reviewed CV testing:12/17/2018 last ECHO LVEF 60-65%, LVH, left ventricular Grade 1 diastolic dysfunction, mildly dilated left ventricle  Palpitations - holter monitor showed frequent episodes of SVT - referred to Dr. Belva Chimes  Assessment and Plan:   Atrial  tachycardia-nonsustained  Palpitations unassociated with arrhythmia  Coronary artery disease with remote RCA stenting  Dyspnea on exertion-subacute  Volume overload-HFpEF   Sleep disordered breathing likely obstructive sleep apnea  The patient is atrial tachycardia is minimally bothersome.  She is worse with more lightheadedness having switched from amlodipine to verapamil.  I first thought it might have been related to the impact of the calcium blocker/beta-blocker combination on her heart rate; however, her heart rates have been in the 605 beat range for the last 5 years.  But given the paucity of symptoms, not sure that directed therapy is necessary.  We will switch her back to her amlodipine.  This is a combination pill with valsartan so we will have her hold her valsartan  As best as I can tell the old medical record which describes atrial fibrillation is without basis, meaning I do not find it in the [facility] records.  She is taking her carvedilol once a day; we will have her take half twice daily.  Dyspnea on exertion is concerning.  She is volume overloaded for sure so we will begin her on a diuretic to furosemide 20 mg daily x5 days and then every other day.  I have stressed the importance of a low-sodium diet.  However in the context of her known coronary disease this as an anginal equivalent must be excluded.  She had a Myoview scan a couple years ago.  I will discuss with Dr. Waldron Session whether CTA is appropriate with her stent in place or whether we she  should go to Myoview scanning.  Sleep disordered breathing.  Hypertension.  We will arrange for an outpatient sleep study. We will see again as needed  Reviewed Dr. Olin Pia last visit which was about 2 months ago -  NM Myo for DOE was done showed to evidence of ischemia, stable function/heart muscle function Patient has continued the same med changes she is currently taking Lasix every other day she is on carvedilol 12.5 mg  twice daily, amlodipine valsartan 10-160 daily.  Patient reports decreasing palpitation episodes, she reports improving dyspnea on exertion but then also states that she has not felt a difference with medication changes.   She has a little LE edema, sometimes her right leg bothers her a little bit -she states that she has not been to a vascular specialist or had any evaluation for dependent edema  Hypothyroidism: Current Medication Regimen:  Levothyroxine 150 mcg three x a week and 137 mcg 4x a week (Tu, Th, Sa Sun) Takes medicine early in the morning usually with metformin, and eats and takes other morning  Current Symptoms: fatigue, weight gain, feeling cold and cold intolerance, swelling, feeling slow and very sleepy Denies poor mood, constipation, change to palpitations, sweats Weight up up 4 lbs since our last visit - with trying to work on diet changes Most recent results are below; we will be repeating labs today. Lab Results  Component Value Date   TSH 2.89 11/08/2018     Morbid Obesity -patient is frustrated states that her weight does continues to go up even though she is working on [employer], cutting out foods.  About 4 pound weight gain since our last visit 5 months ago.  She has done nutritional counseling in the past, she is on Metformin and Victoza, she has not done any higher doses of these.  She has not been to medical weight management in the past we discussed possible referral Wt Readings from Last 5 Encounters:  03/31/19 266 lb 14.4 oz (121.1 kg)  01/30/19 265 lb 4 oz (120.3 kg)  01/23/19 266 lb 12 oz (121 kg)  11/18/18 263 lb 4 oz (119.4 kg)  11/08/18 262 lb 11.2 oz (119.2 kg)   BMI Readings from Last 5 Encounters:  03/31/19 45.81 kg/m  01/30/19 45.53 kg/m  01/23/19 45.79 kg/m  11/18/18 45.19 kg/m  11/08/18 45.09 kg/m    Urinary Incontinence: Patient states that she is having nearly daily incontinence does seem to be more stress-induced and less urge  incontinence.  She is wearing liners every day, she denies any dysuria, hematuria she has no increased urinary frequency unless she drinks a lot more fluid she states she denies any nocturia.  She does not remember her last pelvic exam and is unsure if she is ever been told she has any pelvic floor weakening we discussed pelvic floor therapy or medications but at this time she wishes just to wait and see if it will get better. Of note she does have Lasix recently prescribed to her currently she is only taking it every other day.  She has history of well-controlled diabetes  Patient Active Problem List   Diagnosis Date Noted  . Myalgia due to statin 04/13/2017  . Venous stasis 04/13/2017  . History of non-ST elevation myocardial infarction (NSTEMI) 02/05/2015  . Hx of percutaneous transluminal coronary angioplasty 02/05/2015  . Gout of knee 12/25/2014  . Low back pain 12/25/2014  . Morbid obesity ([place]) 06/26/2014  . Chronic combined systolic and diastolic heart failure ([place])  05/24/2014  . Goiter 03/19/2014  . Coronary atherosclerosis 12/19/2013  . Hyperlipidemia 12/19/2013  . Essential hypertension 12/18/2013  . Diabetes mellitus type 2 in obese ([place]) 12/18/2013  . Hypothyroidism 12/18/2013  . Hearing loss 08/25/2011  . Atrial fibrillation ([place]) 02/13/2011    Past Surgical History:  Procedure Laterality Date  . ABDOMINAL HYSTERECTOMY    . COLONOSCOPY WITH PROPOFOL N/A 08/13/2017   Procedure: COLONOSCOPY WITH PROPOFOL;  Surgeon: Lin Landsman, MD;  Location: [facility] ENDOSCOPY;  Service: Gastroenterology;  Laterality: N/A;  . heart stent  2012    Family History  Problem Relation Age of Onset  . Heart disease Mother   . Diabetes Mother   . Hypertension Mother   . Hyperlipidemia Mother   . Thyroid disease Mother   . Cancer Father        colon  . Diabetes Father   . Hypertension Father   . Diabetes Sister   . Hypertension Sister   . Heart disease Sister   . Hyperlipidemia  Sister   . Kidney disease Sister   . Cancer Maternal Grandfather        lung  . Heart attack Paternal Grandfather   . Heart disease Sister   . Diabetes Sister   . Heart disease Sister   . Arthritis Sister     Social History   Tobacco Use  . Smoking status: Former Smoker    Packs/day: 0.50    Years: 30.00    Pack years: 15.00    Types: Cigarettes    Start date: 01/09/1978    Quit date: 03/09/2013    Years since quitting: 6.0  . Smokeless tobacco: Never Used  Substance Use Topics  . Alcohol use: Yes    Alcohol/week: 0.0 standard drinks    Comment: Socially   . Drug use: No      Current Outpatient Medications:  .  Alirocumab (PRALUENT) 75 MG/ML SOAJ, Inject 75 mg into the skin every 14 (fourteen) days., Disp: 6 pen, Rfl: 1 .  amLODipine-valsartan (EXFORGE) 10-160 MG tablet, Take 1 tablet (10/160 mg) by mouth once daily, Disp: , Rfl:  .  aspirin EC 81 MG tablet, Take 81 mg by mouth daily. , Disp: , Rfl:  .  carvedilol (COREG) 25 MG tablet, Take 0.5 tablets (12.5 mg total) by mouth 2 (two) times daily with a meal., Disp: 90 tablet, Rfl: 1 .  ezetimibe (ZETIA) 10 MG tablet, Take 1 tablet (10 mg total) by mouth daily., Disp: 30 tablet, Rfl: 11 .  famotidine (PEPCID) 20 MG tablet, Take 1 tablet (20 mg total) by mouth 2 (two) times daily., Disp: 180 tablet, Rfl: 1 .  fluticasone (FLONASE) 50 MCG/ACT nasal spray, Place 2 sprays into both nostrils daily. (Patient taking differently: Place 2 sprays into both nostrils as needed. ), Disp: 16 g, Rfl: 2 .  furosemide (LASIX) 20 MG tablet, Take 1 tablet (20 mg) by mouth once daily x 5 days, then take 1 tablet (20 mg) once every other day, Disp: 90 tablet, Rfl: 0 .  levothyroxine (SYNTHROID) 150 MCG tablet, Take 1 tablet by mouth once daily on Monday, Wednesday and Friday and take 137 mcg on the other days., Disp: 78 tablet, Rfl: 1 .  levothyroxine (SYNTHROID, LEVOTHROID) 137 MCG tablet, Take 1 tablet by mouth daily before breakfast on Tuesday,  Thursday, Saturday and Sunday, Disp: 17 tablet, Rfl: 11 .  meclizine (ANTIVERT) 25 MG tablet, Take 1 tablet (25 mg total) by mouth Three (3) times a day  as needed for dizziness., Disp: 30 tablet, Rfl: 1 .  metFORMIN (GLUCOPHAGE-XR) 500 MG 24 hr tablet, Take 1 tablet (500 mg total) by mouth daily with breakfast., Disp: 90 tablet, Rfl: 1 .  VICTOZA 18 MG/3ML SOPN, Inject 1.2 mg into the skin daily., Disp: 6 mL, Rfl: 2 .  diclofenac sodium (VOLTAREN) 1 % GEL, Apply 2 g topically as needed. , Disp: , Rfl:  .  glucose blood test strip, Use as instructed, Disp: 100 each, Rfl: 12 .  Insulin Pen Needle (BD PEN NEEDLE NANO U/F) 32G X 4 MM MISC, Use for insulin administration four times daily., Disp: 200 each, Rfl: 1  Allergies  Allergen Reactions  . Atorvastatin Other (See Comments)    Other reaction(s): Muscle Pain  . Hydralazine Other (See Comments)    Aggravated gout Aggravated gout  . Hydrochlorothiazide Other (See Comments)    Aggravated gout  . Lisinopril Cough  . Pravastatin Other (See Comments)    Other reaction(s): Muscle Pain  . Rosuvastatin Other (See Comments)    Other reaction(s): Muscle Pain    Chart Review Today: I personally reviewed active problem list, medication list, allergies, family history, social history, health maintenance, notes from last encounter, lab results, imaging with the patient/caregiver today. Extensive chart review as noted to HPI and A&P -reviewed all cardiac testing results and office notes over the past several months updated chart accordingly to their updated diagnoses into the patient's current condition regarding her diagnoses with recent testing  Review of Systems  Constitutional: Negative.   HENT: Negative.   Eyes: Negative.   Respiratory: Negative.   Cardiovascular: Negative.   Gastrointestinal: Negative.   Endocrine: Negative.   Genitourinary: Negative.   Musculoskeletal: Negative.   Skin: Negative.   Allergic/Immunologic: Negative.    Neurological: Negative.   Hematological: Negative.   Psychiatric/Behavioral: Negative.   All other systems reviewed and are negative.       Objective:    Vitals:   03/31/19 1321  BP: 136/80  Pulse: 71  Resp: 14  Temp: 97.9 F (36.6 C)  SpO2: 97%  Weight: 266 lb 14.4 oz (121.1 kg)  Height: 5\' 4"  (1.626 m)    Body mass index is 45.81 kg/m.  Physical Exam Vitals and nursing note reviewed.  Constitutional:      General: She is not in acute distress.    Appearance: Normal appearance. She is well-developed. She is obese. She is not ill-appearing, toxic-appearing or diaphoretic.     Interventions: Face mask in place.  HENT:     Head: Normocephalic and atraumatic.     Right Ear: External ear normal.     Left Ear: External ear normal.  Eyes:     General: Lids are normal. No scleral icterus.       Right eye: No discharge.        Left eye: No discharge.     Conjunctiva/sclera: Conjunctivae normal.  Neck:     Trachea: Phonation normal. No tracheal deviation.  Cardiovascular:     Rate and Rhythm: Normal rate and regular rhythm.     Pulses: Normal pulses.          Radial pulses are 2+ on the right side and 2+ on the left side.       Dorsalis pedis pulses are 2+ on the right side and 2+ on the left side.       Posterior tibial pulses are 2+ on the right side and 2+ on the left side.  Heart sounds: Normal heart sounds. No murmur. No friction rub. No gallop.      Comments: Bilateral symmetrical nonpitting lower extremity edema Pulmonary:     Effort: Pulmonary effort is normal. No respiratory distress.     Breath sounds: Normal breath sounds. No stridor. No wheezing, rhonchi or rales.  Chest:     Chest wall: No tenderness.  Abdominal:     General: Bowel sounds are normal. There is no distension.     Palpations: Abdomen is soft.     Tenderness: There is no abdominal tenderness.  Musculoskeletal:        General: No deformity. Normal range of motion.     Cervical back:  Normal range of motion and neck supple.     Right lower leg: Edema present.     Left lower leg: Edema present.  Feet:     Right foot:     Skin integrity: Skin integrity normal.     Toenail Condition: Right toenails are normal.     Left foot:     Skin integrity: Skin integrity normal.     Toenail Condition: Left toenails are normal.     Comments: Diabetic foot exam done see EMR Lymphadenopathy:     Cervical: No cervical adenopathy.  Skin:    General: Skin is warm and dry.     Capillary Refill: Capillary refill takes less than 2 seconds.     Coloration: Skin is not jaundiced or pale.     Findings: No rash.  Neurological:     Mental Status: She is alert.     Cranial Nerves: No dysarthria or facial asymmetry.     Sensory: Sensation is intact.     Motor: No tremor or abnormal muscle tone.     Gait: Gait normal.  Psychiatric:        Attention and Perception: Attention normal.        Speech: Speech normal.        Behavior: Behavior normal.     Comments: Slowed, some delayed slowed answers when responding to questions, pleasant cooperative mood affect and behavior        Diabetic Foot Exam: Diabetic Foot Exam - Simple   Simple Foot Form Diabetic Foot exam was performed with the following findings: Yes 03/31/2019  1:26 PM  Visual Inspection Sensation Testing Pulse Check Comments     PHQ2/9: Depression screen [facility] 2/9 03/31/2019 11/08/2018 08/06/2018 07/23/2018 07/01/2018  Decreased Interest 0 0 0 0 0  Down, Depressed, Hopeless 0 0 0 0 0  PHQ - 2 Score 0 0 0 0 0  Altered sleeping 0 0 0 0 0  Tired, decreased energy 0 0 0 0 0  Change in appetite 0 0 0 0 0  Feeling bad or failure about yourself  0 0 0 0 0  Trouble concentrating 0 0 0 0 0  Moving slowly or fidgety/restless 0 0 0 0 0  Suicidal thoughts 0 0 0 0 0  PHQ-9 Score 0 0 0 0 0  Difficult doing work/chores Not difficult at all Not difficult at all Not difficult at all Not difficult at all Not difficult at all  Some recent  data might be hidden    phq 9 is neg  Fall Risk: Fall Risk  03/31/2019 11/08/2018 08/06/2018 07/23/2018 07/01/2018  Falls in the past year? 0 0 0 0 0  Number falls in past yr: 0 0 0 0 0  Injury with Fall? 0 0 0 0 0  Follow up - - - - -  Functional Status Survey: Is the patient deaf or have difficulty hearing?: Yes Does the patient have difficulty seeing, even when wearing glasses/contacts?: No Does the patient have difficulty concentrating, remembering, or making decisions?: No Does the patient have difficulty walking or climbing stairs?: No Does the patient have difficulty dressing or bathing?: No Does the patient have difficulty doing errands alone such as visiting a doctor's office or shopping?: No   Assessment & Plan:     ICD-10-CM   1. Mixed hyperlipidemia  99991111 COMPLETE METABOLIC PANEL WITH GFR    Lipid panel   on praluent and zetia, choleseterol well controlled, statin intolerant, recent NM study showed no ischemia, encouraged diet and exercise for heart health  2. Diabetes mellitus type 2 in obese [facility]) Chronic [id number] COMPLETE METABOLIC PANEL WITH GFR   E66.9 Hemoglobin A1c   History of being well controlled, recheck labs today, foot exam done, could possibly increase Metformin or Victoza dose see if it would help with weight loss?  3. Essential hypertension  99991111 COMPLETE METABOLIC PANEL WITH GFR   Well-controlled with current medications encouraged her to continue diet lifestyle efforts and conditioning  4. Hypothyroidism, unspecified type  E03.9 TSH   many sx concerning for possibly chemical hypothyroid, slowed though, cold intolerance, weight gain, fatigue and sleepiness - check TSH  5. Heart failure with preserved left ventricular function (HFpEF) (HCC) Chronic I50.30    EF 60 to 123456 grade 1 diastolic dysfunction on echo from 12/29/2018, on Lasix and seems to have improved dyspnea on exertion and lower extremity edema  6. Atrial paroxysmal tachycardia (H[facility])  I47.1     Per Dr. Belva Chimes, symptoms are improved with carvedilol 12.5 mg twice daily less palpitations no orthopnea she reports improved dyspnea on exertion  7. Myalgia due to statin  M79.10 Lipid panel   T46.6X5A    On Praluent and Zetia last cholesterol panel was well controlled with this regimen  8. Statin intolerance  Z78.9 Lipid panel   See above  9. Heart palpitations  R00.2    Per cardiology recent repeated Holter monitor showed runs of atrial tachycardia without any concerning arrhythmias no A. fib/diagnosis removed from problem list  10. 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 Urinary incontinence, unspecified type  R32    offered pelvic flood therapy or additional referrals or testing today, but she declined, encouraged her to l/up and do pelvic flood exercises, f/up PRN  12. Encounter for medication monitoring  Z51.81 CBC with Differential/Platelet    COMPLETE METABOLIC PANEL WITH GFR    Lipid panel    Hemoglobin A1c    TSH    Cardiology note also suspected sleep apnea patient did express today that she is extremely tired which she was concerned may be due to her urinary symptoms I suspected it could be hypothyroid but she was also had several symptoms for chemical hypothyroid such as slowed thought process very slow to answer questions in the room, she is concerned about her weight she is very cold, my plan was to check TSH adjust medications pending results.  Her weight could also adjust some of her diabetes medications-  I also offered referral to medical weight management which she declined.  May also need to have  her assessed for obstructive sleep apnea if this has not been done in the past.  She also complained of urinary  incontinence which has become a everyday thing wearing liners every day and having symptoms every day I spent a significant amount of time discussing this with her discussing work-up and treatment with her but she ultimately declined everything that I offered so I gave her a handout on pelvic floor dysfunction and encouraged her to look up exercises.  Greater than 50% of this visit was spent in direct face-to-face counseling, obtaining history and physical, discussing and educating pt on treatment plan.  Total time of this visit was 45 + min.  Remainder of time involved but was not limited to reviewing chart (recent and pertinent OV notes and labs), documentation in EMR, and coordinating care and treatment plan.  Addressed multiple chronic conditions and acute sx/CC today as well.    Return for 6 CPE/routine conditions f/up split - come sooner PRN weight/thyroid f/up.   Delsa Grana, PA-C 03/31/19 1:26 PM

## 2019-03-31 NOTE — Patient Instructions (Addendum)
Let me know if you are ever interested in pelvic floor therapy or trying meds to urinary symptoms.   Pelvic Floor Dysfunction  Pelvic floor dysfunction (PFD) is a condition that results when the group of muscles and connective tissues that support the organs in the pelvis (pelvic floor muscles) do not work well. These muscles and their connections form a sling that supports the colon and bladder. In men, these muscles also support the prostate gland. In women, they also support the uterus. PFD causes pelvic floor muscles to be too weak, too tight, or a combination of both. In PFD, muscle movements are not coordinated. This condition may cause bowel or bladder problems. It may also cause pain. What are the causes? This condition may be caused by an injury to the pelvic area or by a weakening of pelvic muscles. This often results from pregnancy and childbirth or other types of strain. In many cases, the exact cause is not known. What increases the risk? The following factors may make you more likely to develop this condition:  Having a condition of chronic bladder tissue inflammation (interstitial cystitis).  Being an older person.  Being overweight.  Radiation treatment for cancer in the pelvic region.  Previous pelvic surgery, such as removal of the uterus (hysterectomy) or prostate gland (prostatectomy). What are the signs or symptoms? Symptoms of this condition vary and may include:  Bladder symptoms, such as: ? Trouble starting urination and emptying the bladder. ? Frequent urinary tract infections. ? Leaking urine when coughing, laughing, or exercising (stress incontinence). ? Having to pass urine urgently or frequently. ? Pain when passing urine.  Bowel symptoms, such as: ? Constipation. ? Urgent or frequent bowel movements. ? Incomplete bowel movements. ? Painful bowel movements. ? Leaking stool or gas.  Unexplained genital or rectal pain.  Genital or rectal muscle  spasms.  Low back pain. In women, symptoms of PFD may also include:  A heavy, full, or aching feeling in the vagina.  A bulge that protrudes into the vagina.  Pain during or after sexual intercourse. How is this diagnosed? This condition may be diagnosed based on:  Your symptoms and medical history.  A physical exam. During the exam, your health care provider may check your pelvic muscles for tightness, spasm, pain, or weakness. This may include a rectal exam and a pelvic exam for women. In some cases, you may have diagnostic tests, such as:  Electrical muscle function tests.  Urine flow testing.  X-ray tests of bowel function.  Ultrasound of the pelvic organs. How is this treated? Treatment for this condition depends on your symptoms. Treatment options include:  Physical therapy. This may include Kegel exercises to help relax or strengthen the pelvic floor muscles.  Biofeedback. This type of therapy provides feedback on how tight your pelvic floor muscles are so that you can learn to control them.  Internal or external massage therapy.  A treatment that involves electrical stimulation of the pelvic floor muscles to help control pain (transcutaneous electrical nerve stimulation, or TENS).  Sound wave therapy (ultrasound) to reduce muscle spasms.  Medicines, such as: ? Muscle relaxants. ? Bladder control medicines. Surgery to reconstruct or support pelvic floor muscles may be an option if other treatments do not help. Follow these instructions at home: Activity  Do your usual activities as told by your health care provider. Ask your health care provider if you should modify any activities.  Do pelvic floor strengthening or relaxing exercises at home as told  by your physical therapist. Lifestyle  Maintain a healthy weight.  Eat foods that are high in fiber, such as beans, whole grains, and fresh fruits and vegetables.  Limit foods that are high in fat and processed  sugars, such as fried or sweet foods.  Manage stress with relaxation techniques such as yoga or meditation. General instructions  If you have problems with leakage: ? Use absorbable pads or wear padded underwear. ? Wash frequently with mild soap. ? Keep your genital and anal area as clean and dry as possible. ? Ask your health care provider if you should try a barrier cream to prevent skin irritation.  Take warm baths to relieve pelvic muscle tension or spasms.  Take over-the-counter and prescription medicines only as told by your health care provider.  Keep all follow-up visits as told by your health care provider. This is important. Contact a health care provider if you:  Are not improving with home care.  Have signs or symptoms of PFD that get worse at home.  Develop new signs or symptoms at home.  Have signs of a urinary tract infection, such as: ? Fever. ? Chills. ? Urinary frequency. ? A burning feeling when urinating.  Have not had a bowel movement in 3 days (constipation). Summary  Pelvic floor dysfunction results when the muscles and connective tissues in your pelvic floor do not work well.  These muscles and their connections form a sling that supports your colon and bladder. In men, these muscles also support the prostate gland. In women, they also support the uterus.  PFD may be caused by an injury to the pelvic area or by a weakening of pelvic muscles.  PFD causes pelvic floor muscles to be too weak, too tight, or a combination of both. Symptoms may vary from person to person.  In most cases, PFD can be treated with physical therapies and medicines. Surgery may be an option if other treatments do not help. This information is not intended to replace advice given to you by your health care provider. Make sure you discuss any questions you have with your health care provider. Document Revised: 07/16/2017 Document Reviewed: 07/16/2017 Elsevier Patient Education   [place].

## 2019-04-01 LAB — LIPID PANEL
Cholesterol: 105 mg/dL (ref <200)
HDL: 36 mg/dL — ABNORMAL LOW (ref >=50)
LDL Cholesterol (Calc): 46 mg/dL (calc)
Non-HDL Cholesterol (Calc): 69 mg/dL (calc) (ref <130)
Total CHOL/HDL Ratio: 2.9 (calc) (ref <5.0)
Triglycerides: 156 mg/dL — ABNORMAL HIGH (ref <150)

## 2019-04-01 LAB — CBC WITH DIFFERENTIAL/PLATELET
Absolute Monocytes: 593 cells/uL (ref 200–950)
Basophils Absolute: 29 cells/uL (ref 0–200)
Basophils Relative: 0.5 %
Eosinophils Absolute: 131 cells/uL (ref 15–500)
Eosinophils Relative: 2.3 %
HCT: 41.1 % (ref 35.0–45.0)
Hemoglobin: 13.8 g/dL (ref 11.7–15.5)
Lymphs Abs: 2702 cells/uL (ref 850–3900)
MCH: 29.4 pg (ref 27.0–33.0)
MCHC: 33.6 g/dL (ref 32.0–36.0)
MCV: 87.4 fL (ref 80.0–100.0)
MPV: 9.2 fL (ref 7.5–12.5)
Monocytes Relative: 10.4 %
Neutro Abs: 2246 cells/uL (ref 1500–7800)
Neutrophils Relative %: 39.4 %
Platelets: 238 10*3/uL (ref 140–400)
RBC: 4.7 10*6/uL (ref 3.80–5.10)
RDW: 12.8 % (ref 11.0–15.0)
Total Lymphocyte: 47.4 %
WBC: 5.7 10*3/uL (ref 3.8–10.8)

## 2019-04-01 LAB — COMPLETE METABOLIC PANEL WITH GFR
AG Ratio: 1 (calc) (ref 1.0–2.5)
ALT: 20 U/L (ref 6–29)
AST: 22 U/L (ref 10–35)
Albumin: 3.8 g/dL (ref 3.6–5.1)
Alkaline phosphatase (APISO): 75 U/L (ref 37–153)
BUN: 10 mg/dL (ref 7–25)
CO2: 30 mmol/L (ref 20–32)
Calcium: 9.5 mg/dL (ref 8.6–10.4)
Chloride: 104 mmol/L (ref 98–110)
Creat: 0.92 mg/dL (ref 0.50–1.05)
GFR, Est African American: 80 mL/min/{1.73_m2} (ref >=60)
GFR, Est Non African American: 69 mL/min/{1.73_m2} (ref >=60)
Globulin: 3.8 g/dL (calc) — ABNORMAL HIGH (ref 1.9–3.7)
Glucose, Bld: 115 mg/dL — ABNORMAL HIGH (ref 65–99)
Potassium: 3.8 mmol/L (ref 3.5–5.3)
Sodium: 140 mmol/L (ref 135–146)
Total Bilirubin: 0.3 mg/dL (ref 0.2–1.2)
Total Protein: 7.6 g/dL (ref 6.1–8.1)

## 2019-04-01 LAB — TSH: TSH: 3.35 mIU/L (ref 0.40–4.50)

## 2019-04-01 LAB — HEMOGLOBIN A1C
Hgb A1c MFr Bld: 6.3 % of total Hgb — ABNORMAL HIGH (ref <5.7)
Mean Plasma Glucose: 134 (calc)
eAG (mmol/L): 7.4 (calc)

## 2019-04-03 ENCOUNTER — Telehealth: Payer: Self-pay | Admitting: Family Medicine

## 2019-04-03 NOTE — Telephone Encounter (Signed)
Refill request for ezetimibe 10 mg, prescription expired on 03/19/19 and prescribed by a historical provider. Levothyroxine 137 mg tab, prescription will expire on 03/31/2 and prescribed by a historical provider.

## 2019-04-07 ENCOUNTER — Encounter: Payer: Self-pay | Admitting: Family Medicine

## 2019-04-15 ENCOUNTER — Encounter: Payer: Self-pay | Admitting: Family Medicine

## 2019-04-24 ENCOUNTER — Ambulatory Visit: Payer: BC Managed Care – PPO | Admitting: Cardiology

## 2019-04-28 ENCOUNTER — Other Ambulatory Visit: Payer: Self-pay | Admitting: Family Medicine

## 2019-04-28 NOTE — Telephone Encounter (Signed)
Requested medication (s) are due for refill today: yes  Requested medication (s) are on the active medication list: yes  Last refill:  01/16/2019  Future visit scheduled: yes  Notes to clinic:  medication was filled by a different provider  Review for refill   Requested Prescriptions  Pending Prescriptions Disp Refills   amLODipine-valsartan (EXFORGE) 10-160 MG tablet [Pharmacy Med Name: amLODIPine 10 mg-valsartan 160 mg tablet (EXFORGE)] 90 tablet 1    Sig: Take 1 tablet by mouth daily.      Cardiovascular: CCB + ARB Combos Passed - 04/28/2019  8:10 AM      Passed - K in normal range and within 180 days    Potassium  Date Value Ref Range Status  03/31/2019 3.8 3.5 - 5.3 mmol/L Final  01/19/2011 3.3 (L) 3.5 - 5.1 mmol/L Final          Passed - Cr in normal range and within 180 days    Creat  Date Value Ref Range Status  03/31/2019 0.92 0.50 - 1.05 mg/dL Final    Comment:    For patients >56 years of age, the reference limit for Creatinine is approximately 13% higher for people identified as African-American. .    Creatinine,U  Date Value Ref Range Status  12/18/2013 109.3 mg/dL Final   Creatinine, Urine  Date Value Ref Range Status  01/21/2018 116 20 - 275 mg/dL Final          Passed - Patient is not pregnant      Passed - Last BP in normal range    BP Readings from Last 1 Encounters:  03/31/19 136/80          Passed - Valid encounter within last 6 months    Recent Outpatient Visits           4 weeks ago Mixed hyperlipidemia   [facility] [place], Kristeen Miss, PA-C   5 months ago Diabetes mellitus type 2 in obese [facility])   [facility] Delsa Grana, PA-C   8 months ago Essential hypertension   [place], NP   9 months ago Essential hypertension, benign   [place], NP   10 months ago Bacterial conjunctivitis of left eye   [place], [place]       Future Appointments             In 5 months Delsa Grana, PA-C [facility], [facility]

## 2019-05-05 ENCOUNTER — Other Ambulatory Visit: Payer: Self-pay | Admitting: Internal Medicine

## 2019-05-21 ENCOUNTER — Other Ambulatory Visit: Payer: Self-pay | Admitting: Family Medicine

## 2019-05-21 NOTE — Telephone Encounter (Signed)
Approved per protocol.  

## 2019-05-26 ENCOUNTER — Ambulatory Visit (INDEPENDENT_AMBULATORY_CARE_PROVIDER_SITE_OTHER): Payer: BC Managed Care – PPO | Admitting: Cardiology

## 2019-05-26 ENCOUNTER — Other Ambulatory Visit: Payer: Self-pay

## 2019-05-26 ENCOUNTER — Encounter: Payer: Self-pay | Admitting: Cardiology

## 2019-05-26 VITALS — BP 114/76 | HR 59 | Ht 64.0 in | Wt 263.0 lb

## 2019-05-26 DIAGNOSIS — I471 Supraventricular tachycardia: Secondary | ICD-10-CM

## 2019-05-26 DIAGNOSIS — I10 Essential (primary) hypertension: Secondary | ICD-10-CM | POA: Diagnosis not present

## 2019-05-26 DIAGNOSIS — I2510 Atherosclerotic heart disease of native coronary artery without angina pectoris: Secondary | ICD-10-CM

## 2019-05-26 NOTE — Patient Instructions (Signed)
Medication Instructions:  Your physician recommends that you continue on your current medications as directed. Please refer to the Current Medication list given to you today.  *If you need a refill on your cardiac medications before your next appointment, please call your pharmacy*   Lab Work: None ordered If you have labs (blood work) drawn today and your tests are completely normal, you will receive your results only by: . MyChart Message (if you have MyChart) OR . A paper copy in the mail If you have any lab test that is abnormal or we need to change your treatment, we will call you to review the results.   Testing/Procedures: None ordered   Follow-Up: At [facility], you and your health needs are our priority.  As part of our continuing mission to provide you with exceptional heart care, we have created designated Provider Care Teams.  These Care Teams include your primary Cardiologist (physician) and Advanced Practice Providers (APPs -  Physician Assistants and Nurse Practitioners) who all work together to provide you with the care you need, when you need it.  We recommend signing up for the patient portal called "MyChart".  Sign up information is provided on this After Visit Summary.  MyChart is used to connect with patients for Virtual Visits (Telemedicine).  Patients are able to view lab/test results, encounter notes, upcoming appointments, etc.  Non-urgent messages can be sent to your provider as well.   To learn more about what you can do with MyChart, go to [email or web address].    Your next appointment:   6 month(s)  The format for your next appointment:   In Person  Provider:    You may see Brian Agbor-Etang, MD or one of the following Advanced Practice Providers on your designated Care Team:    Christopher Berge, NP  Ryan Dunn, PA-C  Jacquelyn Visser, PA-C    Other Instructions N/A  

## 2019-05-26 NOTE — Progress Notes (Signed)
Cardiology Office Note:    Date:  05/26/2019   ID:  Elizabeth Russo, DOB 1960-09-28, MRN [id number]  PCP:  Delsa Grana, PA-C  Cardiologist:  Kate Sable, MD  Electrophysiologist:  None   Referring MD: Delsa Grana, PA-C    Chief Complaint  Patient presents with  . office visit    3 month F/U; Meds verbally reviewed with patient.    History of Present Illness:    Elizabeth Russo is a 59 y.o. female with a hx of hypertension, CAD, NSTEMI with PCI to [facility] January 2013),  who presents for follow-up.  She was previously seen for palpitations, cardiac monitor showed atrial tachycardia but no evidence of atrial fibrillation or flutter.  Verapamil was started but patient has symptoms of lightheadedness and amlodipine was restarted in combination with valsartan for BP control.  On prior visits, patient had some shortness of breath with exertion.  This has improved, and patient feels better.  She has some pain in her ankles 3 days ago which she thinks might be a gouty attack.  She has a prior history of gout.  She plans to follow-up with primary care regarding this.  She otherwise feels okay and has no concerns at this time.  Historical notes Patient used to follow-up at [facility].  She had bilateral arm and neck pain followed by dyspnea on exertion in January 2013.  She was diagnosed with NSTEMI.  Left heart cath was performed which showed an occluded mid RCA, 90% stenosis in a small OM1 branch.  50% proximal RCA, distal LAD large OM 2.  PCI was performed to the OM 2 branch with a drug-eluting stent.  In 2018, she had a stress echocardiogram, exercising for 6 minutes achieving 10.1 METS with no wall motion abnormalities.  Last echocardiogram in 2013 showed EF of 45%, with mild concentric LVH.  She has a history of myalgia on statin.  She is currently taking Zetia and Praluent for hyperlipidemia. .  Past Medical History:  Diagnosis Date  . Atrial fibrillation ([place]) 02/13/2011  . Breast  mass, right 04/25/2014  . CAD (coronary artery disease)   . Diabetes mellitus without complication ([place])   . Diverticulosis   . Family history of colon cancer in father   . Goiter 03/19/2014  . Hearing loss 08/25/2011  . Hypertension   . Hypothyroid   . Localized enlarged lymph nodes 10/23/2014   cervical   . Low back pain 12/25/2014  . Morbid obesity ([place]) 06/26/2014  . Myalgia due to statin 04/13/2017   Intolerant to three different statins  . Personal history of colonic polyps   . Vertigo     Past Surgical History:  Procedure Laterality Date  . ABDOMINAL HYSTERECTOMY    . COLONOSCOPY WITH PROPOFOL N/A 08/13/2017   Procedure: COLONOSCOPY WITH PROPOFOL;  Surgeon: Lin Landsman, MD;  Location: [facility] ENDOSCOPY;  Service: Gastroenterology;  Laterality: N/A;  . heart stent  2012    Current Medications: Current Meds  Medication Sig  . Alirocumab (PRALUENT) 75 MG/ML SOAJ Inject 75 mg into the skin every 14 (fourteen) days.  Marland Kitchen amLODipine-valsartan (EXFORGE) 10-160 MG tablet Take 1 tablet by mouth daily.  Marland Kitchen aspirin EC 81 MG tablet Take 81 mg by mouth daily.   . carvedilol (COREG) 25 MG tablet Take 0.5 tablets (12.5 mg total) by mouth 2 (two) times daily with a meal.  . diclofenac sodium (VOLTAREN) 1 % GEL Apply 2 g topically as needed.   . ezetimibe (ZETIA)  10 MG tablet Take 1 tablet (10 mg total) by mouth daily.  . famotidine (PEPCID) 20 MG tablet Take 1 tablet (20 mg total) by mouth 2 (two) times daily.  . fluticasone (FLONASE) 50 MCG/ACT nasal spray Place 2 sprays into both nostrils daily. (Patient taking differently: Place 2 sprays into both nostrils as needed. )  . furosemide (LASIX) 20 MG tablet Take 1 tablet (20 mg) once every other day Please call to schedule office visit. Thank you!  . glucose blood test strip Use as instructed  . Insulin Pen Needle (BD PEN NEEDLE NANO U/F) 32G X 4 MM MISC Use for insulin administration four times daily.  Marland Kitchen levothyroxine (SYNTHROID) 137 MCG  tablet Take 1 tablet by mouth daily before breakfast on Tuesday, Thursday, Saturday and Sunday  . levothyroxine (SYNTHROID) 150 MCG tablet Take 1 tablet by mouth once daily on Monday, Wednesday and Friday and take 137 mcg on the other days.  . meclizine (ANTIVERT) 25 MG tablet Take 1 tablet (25 mg total) by mouth Three (3) times a day as needed for dizziness.  . metFORMIN (GLUCOPHAGE-XR) 500 MG 24 hr tablet Take 1 tablet (500 mg total) by mouth daily with breakfast.  . olopatadine (PATANOL) 0.1 % ophthalmic solution Place 1 drop into both eyes as needed for allergies.  Marland Kitchen VICTOZA 18 MG/3ML SOPN Inject 1.2 mg into the skin daily.     Allergies:   Atorvastatin, Hydralazine, Hydrochlorothiazide, Lisinopril, Pravastatin, and Rosuvastatin   Social History   Socioeconomic History  . Marital status: Widowed    Spouse name: Not on file  . Number of children: 2  . Years of education: Not on file  . Highest education level: High school graduate  Occupational History  . Not on file  Tobacco Use  . Smoking status: Former Smoker    Packs/day: 0.50    Years: 30.00    Pack years: 15.00    Types: Cigarettes    Start date: 01/09/1978    Quit date: 03/09/2013    Years since quitting: 6.2  . Smokeless tobacco: Never Used  Substance and Sexual Activity  . Alcohol use: Yes    Alcohol/week: 0.0 standard drinks    Comment: Socially   . Drug use: No  . Sexual activity: Yes    Birth control/protection: Post-menopausal  Other Topics Concern  . Not on file  Social History Narrative  . Not on file   Social Determinants of Health   Financial Resource Strain:   . Difficulty of Paying Living Expenses:   Food Insecurity:   . Worried About Charity fundraiser in the Last Year:   . Arboriculturist in the Last Year:   Transportation Needs:   . Film/video editor (Medical):   Marland Kitchen Lack of Transportation (Non-Medical):   Physical Activity:   . Days of Exercise per Week:   . Minutes of Exercise per  Session:   Stress:   . Feeling of Stress :   Social Connections:   . Frequency of Communication with Friends and Family:   . Frequency of Social Gatherings with Friends and Family:   . Attends Religious Services:   . Active Member of Clubs or Organizations:   . Attends Archivist Meetings:   Marland Kitchen Marital Status:      Family History: The patient's family history includes Arthritis in her sister; Cancer in her father and maternal grandfather; Diabetes in her father, mother, sister, and sister; Heart attack in her paternal grandfather;  Heart disease in her mother, sister, sister, and sister; Hyperlipidemia in her mother and sister; Hypertension in her father, mother, and sister; Kidney disease in her sister; Thyroid disease in her mother.  ROS:   Please see the history of present illness.     All other systems reviewed and are negative.  EKGs/Labs/Other Studies Reviewed:    The following studies were reviewed today:  TTE 12/17/2018  1. Left ventricular ejection fraction, by visual estimation, is 60 to 65%. The left ventricle has normal function. There is mildly increased left ventricular hypertrophy.  2. Left ventricular diastolic parameters are consistent with Grade I diastolic dysfunction (impaired relaxation).  3. Mildly dilated left ventricular internal cavity size.  4. The left ventricle has no regional wall motion abnormalities.  5. Global right ventricle has normal systolic function.The right ventricular size is normal. No increase in right ventricular wall thickness.  6. Left atrial size was normal.  7. Right atrial size was normal.  8. Trivial pericardial effusion is present.  9. The mitral valve is normal in structure. Trace mitral valve regurgitation. 10. The tricuspid valve is normal in structure. Tricuspid valve regurgitation is mild. 11. The aortic valve was not well visualized. Aortic valve regurgitation is not visualized. No evidence of aortic valve sclerosis or  stenosis. 12. The pulmonic valve was grossly normal. Pulmonic valve regurgitation is trivial. 13. Normal pulmonary artery systolic pressure. 14. The inferior vena cava is normal in size with <50% respiratory variability, suggesting right atrial pressure of 8 mmHg. 15. The interatrial septum was not well visualized.  Cardiac monitor 11/18/2018 Patient had a min HR of 50 bpm, max HR of 190 bpm, and avg HR of 66 bpm. Predominant underlying rhythm was Sinus Rhythm. Slight P wave morphology changes were noted. 46 Supraventricular Tachycardia runs occurred, the run with the fastest interval lasting 4 beats with a max rate of 190 bpm, the longest lasting 14.6 secs (26 beats) with an avg rate of 141 bpm. Some episodes of Supraventricular Tachycardia may be possible Atrial Tachycardia with variable block. Supraventricular Tachycardia was detected within +/- 45 seconds of symptomatic patient event(s). Isolated SVEs were rare (<1.0%), SVE Couplets were rare (<1.0%), and SVE Triplets were rare (<1.0%).  Stress echocardiogram 09/20/2016 INDETERMINATE. NORMAL LA PRESSURES WITH DIASTOLIC DYSFUNCTION VALVULAR REGURGITATION: TRIVIAL MR, TRIVIAL PR NO VALVULAR STENOSIS Note: NO WMA AT SUBTARGET HR OF 126bpm(TARGET HR=140bpm) Maximum workload of 10.10 METs was achieved during exercise. RESTING HYPERTENSION - APPROPRIATE RESPONSE  02/2011 2D Echo: EF 45%, mild concentric LVH, DD gr. I, LAE 4.2cm,mild TR (PRVP 18mmHg), ascending aorta dilated to 3.9cm.  01/2016 Stress Echo: EF>55%, moderate LVH, DD gr. I, No VHD  Jan. 2013 Cardiac Cath: occluded mRCA, 90% stenosis in sm. OM1 and Large OM2, 50% stenosis in pRCA, dLAD and Large OM2. PCI of the OM2 w/ DES.    EKG:  EKG is  ordered today.  The ekg ordered today demonstrates normal sinus rhythm, heart rate 61, nonspecific ST changes.  Recent Labs: 03/31/2019: ALT 20; BUN 10; Creat 0.92; Hemoglobin 13.8; Platelets 238; Potassium 3.8; Sodium 140; TSH 3.35    Recent Lipid Panel    Component Value Date/Time   CHOL 105 03/31/2019 1405   CHOL 184 05/04/2015 0858   TRIG 156 (H) 03/31/2019 1405   HDL 36 (L) 03/31/2019 1405   HDL 38 (L) 05/04/2015 0858   CHOLHDL 2.9 03/31/2019 1405   VLDL 21 04/04/2016 0932   LDLCALC 46 03/31/2019 1405    Physical Exam:  VS:  BP 114/76 (BP Location: Left Arm, Patient Position: Sitting, Cuff Size: Large)   Pulse (!) 59   Ht 5\' 4"  (1.626 m)   Wt 263 lb (119.3 kg)   LMP  (LMP Unknown)   SpO2 99%   BMI 45.14 kg/m     Wt Readings from Last 3 Encounters:  05/26/19 263 lb (119.3 kg)  03/31/19 266 lb 14.4 oz (121.1 kg)  01/30/19 265 lb 4 oz (120.3 kg)     GEN:  Well nourished, well developed in no acute distress, obese HEENT: Normal NECK: No JVD; No carotid bruits LYMPHATICS: No lymphadenopathy CARDIAC: RRR, no murmurs, rubs, gallops RESPIRATORY:  Clear to auscultation without rales, wheezing or rhonchi  ABDOMEN: Soft, non-tender, non-distended MUSCULOSKELETAL:  No edema; No deformity  SKIN: Warm and dry NEUROLOGIC:  Alert and oriented x 3 PSYCHIATRIC:  Normal affect   ASSESSMENT:    1. Atrial tachycardia ([place])   2. Essential hypertension   3. Coronary artery disease involving native heart, angina presence unspecified, unspecified vessel or lesion type   4. Morbid obesity ([place])    PLAN:    In order of problems listed above:  1. Patient with history of atrial tachycardia.  Echo showed EF 60 to 123456, grade 1 diastolic dysfunction.  Could not tolerate verapamil, tolerating Coreg.  Continue Coreg 25 mg twice daily.  Current heart rate 59. 2. History of hypertension, BP well controlled.  Continue current BP meds. 3. History of CAD continue aspirin, Praluent due to history of CAD and drug-eluting stent to OM1.  Last LDL at goal with value of of 46.  Symptoms of dyspnea on exertion are improving.  Likely due to deconditioning/weight gain. 4. Patient is morbidly obese.  Weight loss  advised.   Follow-up in 6 months.  Total encounter time more than 35 minutes  Greater than 50% was spent in counseling and coordination of care with the patient   This note was generated in part or whole with voice recognition software. Voice recognition is usually quite accurate but there are transcription errors that can and very often do occur. I apologize for any typographical errors that were not detected and corrected.   Medication Adjustments/Labs and Tests Ordered: Current medicines are reviewed at length with the patient today.  Concerns regarding medicines are outlined above.  Orders Placed This Encounter  Procedures  . EKG 12-Lead   No orders of the defined types were placed in this encounter.   Patient Instructions  Medication Instructions:  Your physician recommends that you continue on your current medications as directed. Please refer to the Current Medication list given to you today.  *If you need a refill on your cardiac medications before your next appointment, please call your pharmacy*   Lab Work: None ordered If you have labs (blood work) drawn today and your tests are completely normal, you will receive your results only by: Marland Kitchen MyChart Message (if you have MyChart) OR . A paper copy in the mail If you have any lab test that is abnormal or we need to change your treatment, we will call you to review the results.   Testing/Procedures: None ordered   Follow-Up: At [facility], you and your health needs are our priority.  As part of our continuing mission to provide you with exceptional heart care, we have created designated Provider Care Teams.  These Care Teams include your primary Cardiologist (physician) and Advanced Practice Providers (APPs -  Physician Assistants and Nurse Practitioners) who all  work together to provide you with the care you need, when you need it.  We recommend signing up for the patient portal called "MyChart".  Sign up  information is provided on this After Visit Summary.  MyChart is used to connect with patients for Virtual Visits (Telemedicine).  Patients are able to view lab/test results, encounter notes, upcoming appointments, etc.  Non-urgent messages can be sent to your provider as well.   To learn more about what you can do with MyChart, go to [email or web address].    Your next appointment:   6 month(s)  The format for your next appointment:   In Person  Provider:    You may see Kate Sable, MD or one of the following Advanced Practice Providers on your designated Care Team:    Murray Hodgkins, NP  Christell Faith, PA-C  Marrianne Mood, PA-C    Other Instructions N/A     Signed, Kate Sable, MD  05/26/2019 4:45 PM    [place]

## 2019-06-02 ENCOUNTER — Other Ambulatory Visit: Payer: Self-pay | Admitting: Family Medicine

## 2019-06-02 DIAGNOSIS — R42 Dizziness and giddiness: Secondary | ICD-10-CM

## 2019-06-02 NOTE — Telephone Encounter (Signed)
Requested medication (s) are due for refill today: yes  Requested medication (s) are on the active medication list: yes  Last refill: 08/21/2018   #30  1 refill  Future visit scheduled Yes  10/10/19  Notes to clinic: not delegated  Requested Prescriptions  Pending Prescriptions Disp Refills   meclizine (ANTIVERT) 25 MG tablet [Pharmacy Med Name: meclizine 25 mg tablet (ANTIVERT)] 30 tablet 1    Sig: Take 1 tablet (25 mg total) by mouth Three (3) times a day as needed for dizziness.      Not Delegated - Gastroenterology: Antiemetics Failed - 06/02/2019 10:27 AM      Failed - This refill cannot be delegated      Passed - Valid encounter within last 6 months    Recent Outpatient Visits           2 months ago Mixed hyperlipidemia   [facility] Delsa Grana, PA-C   6 months ago Diabetes mellitus type 2 in obese [facility])   [facility] Delsa Grana, PA-C   10 months ago Essential hypertension   [place], NP   10 months ago Essential hypertension, benign   [facility] Fredderick Severance, NP   11 months ago Bacterial conjunctivitis of left eye   [place], [place]       Future Appointments             In 4 months Delsa Grana, PA-C [facility], [place]   In 5 months Agbor-Etang, Aaron Edelman, MD [facility], [place]

## 2019-06-19 ENCOUNTER — Other Ambulatory Visit: Payer: Self-pay | Admitting: Family Medicine

## 2019-06-19 DIAGNOSIS — I10 Essential (primary) hypertension: Secondary | ICD-10-CM

## 2019-06-19 DIAGNOSIS — I480 Paroxysmal atrial fibrillation: Secondary | ICD-10-CM

## 2019-06-19 MED ORDER — CARVEDILOL 25 MG PO TABS: 12.5000 mg | ORAL_TABLET | Freq: Two times a day (BID) | ORAL | 1 refills | Status: DC

## 2019-06-19 NOTE — Telephone Encounter (Signed)
Copied from [place] [phone]. Topic: Quick Communication - Rx Refill/Question >> Jun 19, 2019  1:46 PM Mcneil, Ja-Kwan wrote: Medication: carvedilol (COREG) 25 MG tablet   Has the patient contacted their pharmacy? yes   Preferred Pharmacy (with phone number or street name): [facility], [place]  Phone: [phone]   Fax: [phone]  Agent: Please be advised that RX refills may take up to 3 business days. We ask that you follow-up with your pharmacy.

## 2019-07-02 ENCOUNTER — Other Ambulatory Visit: Payer: Self-pay | Admitting: Family Medicine

## 2019-07-29 ENCOUNTER — Other Ambulatory Visit: Payer: Self-pay | Admitting: Family Medicine

## 2019-08-07 ENCOUNTER — Other Ambulatory Visit: Payer: Self-pay | Admitting: Internal Medicine

## 2019-08-08 NOTE — Telephone Encounter (Signed)
Unable to leave message due to VM being full. Need to verify how pt is taking Furosemide 20 mg tablet.

## 2019-08-11 ENCOUNTER — Other Ambulatory Visit: Payer: Self-pay | Admitting: Internal Medicine

## 2019-08-26 ENCOUNTER — Other Ambulatory Visit: Payer: Self-pay | Admitting: Family Medicine

## 2019-10-09 NOTE — Progress Notes (Signed)
Patient: Elizabeth Russo, Female    DOB: 1960/09/24, 59 y.o.   MRN: [id number] Delsa Grana, PA-C Visit Date: 10/15/2019  Today's Provider: Delsa Grana, PA-C   Chief Complaint  Patient presents with  . Annual Exam   Subjective:   Annual physical exam:  Elizabeth Russo is a 59 y.o. female who presents today for complete physical exam:  Exercise/Activity: not very active Diet/nutrition:  Overall average diet Sleep: sleeps ok  Hot flashes and dizzy episodes, seems to occur at night  Dizzy episodes have seem to get worse, typically occur when she is sitting or sometimes when going to bed, feels as if everything is spinning, symptoms last for short periods of time.  She has been on meclizine and this does not seem to make a difference with her recent worsening.  She has not had any associated headaches, visual disturbances, numbness, weakness, slurred speech or facial droop associated, no palpitations shortness of breath, chest pain or diaphoresis associated  Hypertension and tachycardia, CAD, sees cardiology She has managed on carvedilol, Lasix  Dm-managed on Victoza and Metformin she is up-to-date on her foot exam and eye exam, due for A1c today  HLD with CAD- on praluent and zetia - per cardiology  Hypothyroid -feels that her symptoms have been stable no change in weight, mood, energy level, no significant change of bowel movements hair or skin changes.  She is on levothyroxine 137 mcg on Tuesday Thursday Saturday and Sunday and 150 mcg on Monday Wednesday Friday  USPSTF grade A and B recommendations - reviewed and addressed today  Depression:  Phq 9 completed today by patient, was reviewed by me with patient in the room PHQ score is neg, pt feels mood is good PHQ 2/9 Scores 10/10/2019 03/31/2019 11/08/2018 08/06/2018  PHQ - 2 Score 0 0 0 0  PHQ- 9 Score - 0 0 0   Depression screen [facility] 2/9 10/10/2019 03/31/2019 11/08/2018 08/06/2018 07/23/2018  Decreased Interest 0 0 0 0 0    Down, Depressed, Hopeless 0 0 0 0 0  PHQ - 2 Score 0 0 0 0 0  Altered sleeping - 0 0 0 0  Tired, decreased energy - 0 0 0 0  Change in appetite - 0 0 0 0  Feeling bad or failure about yourself  - 0 0 0 0  Trouble concentrating - 0 0 0 0  Moving slowly or fidgety/restless - 0 0 0 0  Suicidal thoughts - 0 0 0 0  PHQ-9 Score - 0 0 0 0  Difficult doing work/chores - Not difficult at all Not difficult at all Not difficult at all Not difficult at all  Some recent data might be hidden    Alcohol screening:   Office Visit from 10/10/2019 in [facility]  AUDIT-C Score 0      Immunizations and Health Maintenance: Health Maintenance  Topic Date Due  . MAMMOGRAM  03/30/2020 (Originally 10/30/2018)  . TETANUS/TDAP  10/09/2020 (Originally 12/05/1979)  . FOOT EXAM  03/30/2020  . HEMOGLOBIN A1C  04/09/2020  . OPHTHALMOLOGY EXAM  05/19/2020  . COLONOSCOPY  08/14/2022  . INFLUENZA VACCINE  Completed  . PNEUMOCOCCAL POLYSACCHARIDE VACCINE AGE 41-64 HIGH RISK  Completed  . COVID-19 Vaccine  Completed  . Hepatitis C Screening  Completed  . HIV Screening  Completed     Hep C Screening: done 10/23/16  STD testing and prevention (HIV/chl/gon/syphilis): HIV done 10/15/18see above, no additional testing desired by pt today  Intimate  partner violence:  Safe at home, lives alone  Sexual History/Pain during Intercourse: Widowed  Menstrual History/LMP/Abnormal Bleeding:    No LMP recorded (lmp unknown). Patient has had a hysterectomy.  Incontinence Symptoms: none  Breast cancer:  Last Mammogram: ordered see HM list above BRCA gene screening: no known  Cervical cancer screening:  S/p hysterectomy Pt denies family hx of cancers - breast, ovarian, uterine, colon:      Osteoporosis:   Not on supplement  Discussion on osteoporosis per age, including high calcium and vitamin D supplementation, weight bearing exercises  Skin cancer:  Hx of skin CA -  NO Discussed atypical  lesions - none,   Colorectal cancer:   Colonoscopy is up-to-date -due for 5-year follow-up in 2024 Discussed concerning signs and sx of CRC, pt denies melena, hematochezia, change in bowel movement or caliber  Lung cancer:   Low Dose CT Chest recommended if Age 28-80 years, 30 pack-year currently smoking OR have quit w/in 15years. Patient does not qualify.    Social History   Tobacco Use  . Smoking status: Former Smoker    Packs/day: 0.50    Years: 30.00    Pack years: 15.00    Types: Cigarettes    Start date: 01/09/1978    Quit date: 03/09/2013    Years since quitting: 6.6  . Smokeless tobacco: Never Used  Vaping Use  . Vaping Use: Never used  Substance Use Topics  . Alcohol use: Yes    Alcohol/week: 0.0 standard drinks    Comment: Socially   . Drug use: No       Office Visit from 10/10/2019 in [facility]  AUDIT-C Score 0      Family History  Problem Relation Age of Onset  . Heart disease Mother   . Diabetes Mother   . Hypertension Mother   . Hyperlipidemia Mother   . Thyroid disease Mother   . Cancer Father        colon  . Diabetes Father   . Hypertension Father   . Diabetes Sister   . Hypertension Sister   . Heart disease Sister   . Hyperlipidemia Sister   . Kidney disease Sister   . Cancer Maternal Grandfather        lung  . Heart attack Paternal Grandfather   . Heart disease Sister   . Diabetes Sister   . Heart disease Sister   . Arthritis Sister      Blood pressure/Hypertension: BP Readings from Last 3 Encounters:  10/10/19 124/82  05/26/19 114/76  03/31/19 136/80    Weight/Obesity: Wt Readings from Last 3 Encounters:  10/10/19 257 lb 8 oz (116.8 kg)  05/26/19 263 lb (119.3 kg)  03/31/19 266 lb 14.4 oz (121.1 kg)   BMI Readings from Last 3 Encounters:  10/10/19 44.20 kg/m  05/26/19 45.14 kg/m  03/31/19 45.81 kg/m     Lipids:  Lab Results  Component Value Date   CHOL 104 10/10/2019   CHOL 105 03/31/2019   CHOL  122 11/08/2018   Lab Results  Component Value Date   HDL 42 (L) 10/10/2019   HDL 36 (L) 03/31/2019   HDL 37 (L) 11/08/2018   Lab Results  Component Value Date   LDLCALC 45 10/10/2019   LDLCALC 46 03/31/2019   LDLCALC 67 11/08/2018   Lab Results  Component Value Date   TRIG 90 10/10/2019   TRIG 156 (H) 03/31/2019   TRIG 98 11/08/2018   Lab Results  Component Value  Date   CHOLHDL 2.5 10/10/2019   CHOLHDL 2.9 03/31/2019   CHOLHDL 3.3 11/08/2018   No results found for: LDLDIRECT Based on the results of lipid panel his/her cardiovascular risk factor ( using [facility] )  in the next 10 years is: The ASCVD Risk score Mikey Bussing DC Jr., et al., 2013) failed to calculate for the following reasons:   The patient has a prior MI or stroke diagnosis Glucose:  Glucose  Date Value Ref Range Status  01/19/2011 183 (H) 65 - 99 mg/dL Final   Glucose, Bld  Date Value Ref Range Status  10/10/2019 94 65 - 99 mg/dL Final    Comment:    .            Fasting reference interval .   03/31/2019 115 (H) 65 - 99 mg/dL Final    Comment:    .            Fasting reference interval . For someone without known diabetes, a glucose value between 100 and 125 mg/dL is consistent with prediabetes and should be confirmed with a follow-up test. .   11/08/2018 106 (H) 65 - 99 mg/dL Final    Comment:    .            Fasting reference interval . For someone without known diabetes, a glucose value between 100 and 125 mg/dL is consistent with prediabetes and should be confirmed with a follow-up test. .    Glucose-Capillary  Date Value Ref Range Status  08/13/2017 83 70 - 99 mg/dL Final   Hypertension: BP Readings from Last 3 Encounters:  10/10/19 124/82  05/26/19 114/76  03/31/19 136/80   Obesity: Wt Readings from Last 3 Encounters:  10/10/19 257 lb 8 oz (116.8 kg)  05/26/19 263 lb (119.3 kg)  03/31/19 266 lb 14.4 oz (121.1 kg)   BMI Readings from Last 3 Encounters:  10/10/19 44.20  kg/m  05/26/19 45.14 kg/m  03/31/19 45.81 kg/m      Advanced Care Planning:  A voluntary discussion about advance care planning including the explanation and discussion of advance directives.   Discussed health care proxy and Living will, and the patient was able to identify a health care proxy as  Her daughters Evalee Jefferson and Tyler Pita  Patient does not have a living will at present time.    Social History      She         Social History   Socioeconomic History  . Marital status: Widowed    Spouse name: Not on file  . Number of children: 2  . Years of education: Not on file  . Highest education level: High school graduate  Occupational History  . Not on file  Tobacco Use  . Smoking status: Former Smoker    Packs/day: 0.50    Years: 30.00    Pack years: 15.00    Types: Cigarettes    Start date: 01/09/1978    Quit date: 03/09/2013    Years since quitting: 6.6  . Smokeless tobacco: Never Used  Vaping Use  . Vaping Use: Never used  Substance and Sexual Activity  . Alcohol use: Yes    Alcohol/week: 0.0 standard drinks    Comment: Socially   . Drug use: No  . Sexual activity: Yes    Birth control/protection: Post-menopausal  Other Topics Concern  . Not on file  Social History Narrative  . Not on file   Social Determinants of Health  Financial Resource Strain: Low Risk   . Difficulty of Paying Living Expenses: Not hard at all  Food Insecurity: No Food Insecurity  . Worried About Charity fundraiser in the Last Year: Never true  . Ran Out of Food in the Last Year: Never true  Transportation Needs: No Transportation Needs  . Lack of Transportation (Medical): No  . Lack of Transportation (Non-Medical): No  Physical Activity: Inactive  . Days of Exercise per Week: 0 days  . Minutes of Exercise per Session: 0 min  Stress: No Stress Concern Present  . Feeling of Stress : Not at all  Social Connections: Socially Isolated  . Frequency of Communication  with Friends and Family: Once a week  . Frequency of Social Gatherings with Friends and Family: Once a week  . Attends Religious Services: More than 4 times per year  . Active Member of Clubs or Organizations: No  . Attends Archivist Meetings: Never  . Marital Status: Widowed    Family History        Family History  Problem Relation Age of Onset  . Heart disease Mother   . Diabetes Mother   . Hypertension Mother   . Hyperlipidemia Mother   . Thyroid disease Mother   . Cancer Father        colon  . Diabetes Father   . Hypertension Father   . Diabetes Sister   . Hypertension Sister   . Heart disease Sister   . Hyperlipidemia Sister   . Kidney disease Sister   . Cancer Maternal Grandfather        lung  . Heart attack Paternal Grandfather   . Heart disease Sister   . Diabetes Sister   . Heart disease Sister   . Arthritis Sister     Patient Active Problem List   Diagnosis Date Noted  . Statin intolerance 03/31/2019  . Heart failure with preserved left ventricular function (HFpEF) ([place]) 03/31/2019  . Atrial paroxysmal tachycardia ([place]) 03/31/2019  . 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 Urinary incontinence 03/31/2019  . Myalgia due to statin 04/13/2017  . Venous stasis 04/13/2017  . History of non-ST elevation myocardial infarction (NSTEMI) 02/05/2015  . Hx of percutaneous transluminal coronary angioplasty 02/05/2015  . Gout of knee 12/25/2014  . Coronary atherosclerosis 12/19/2013  . Hyperlipidemia 12/19/2013  . Essential hypertension 12/18/2013  . Diabetes mellitus type 2 in obese ([place]) 12/18/2013  . Hypothyroidism 12/18/2013    Past Surgical History:  Procedure Laterality Date  . ABDOMINAL HYSTERECTOMY    . COLONOSCOPY WITH PROPOFOL N/A 08/13/2017   Procedure: COLONOSCOPY WITH PROPOFOL;  Surgeon: Lin Landsman, MD;  Location: [facility] ENDOSCOPY;  Service:  Gastroenterology;  Laterality: N/A;  . heart stent  2012     Current Outpatient Medications:  .  Alirocumab (PRALUENT) 75 MG/ML SOAJ, Inject 75 mg into the skin every 14 (fourteen) days., Disp: 6 pen, Rfl: 1 .  aspirin EC 81 MG tablet, Take 81 mg by mouth daily. , Disp: , Rfl:  .  carvedilol (COREG) 25 MG tablet, Take 0.5 tablets (12.5 mg total) by mouth 2 (two) times daily with a meal., Disp: 90 tablet, Rfl: 1 .  diclofenac sodium (VOLTAREN) 1 % GEL, Apply 2 g topically as needed. , Disp: , Rfl:  .  ezetimibe (ZETIA) 10 MG tablet, Take 1 tablet (10 mg total) by mouth daily., Disp: 30  tablet, Rfl: 11 .  famotidine (PEPCID) 20 MG tablet, Take 1 tablet (20 mg total) by mouth 2 (two) times daily., Disp: 180 tablet, Rfl: 1 .  fluticasone (FLONASE) 50 MCG/ACT nasal spray, Place 2 sprays into both nostrils daily. (Patient taking differently: Place 2 sprays into both nostrils as needed. ), Disp: 16 g, Rfl: 2 .  furosemide (LASIX) 20 MG tablet, Take 1 tablet (20 mg) by mouth once daily for 5 days, then take 1 tablet (20 mg) once every other day, Disp: 90 tablet, Rfl: 0 .  glucose blood test strip, Use as instructed, Disp: 100 each, Rfl: 12 .  Insulin Pen Needle (BD PEN NEEDLE NANO U/F) 32G X 4 MM MISC, Use for insulin administration four times daily., Disp: 200 each, Rfl: 1 .  levothyroxine (SYNTHROID) 137 MCG tablet, Take 1 tablet by mouth daily before breakfast on Tuesday, Thursday, Saturday and Sunday, Disp: 17 tablet, Rfl: 11 .  levothyroxine (SYNTHROID) 150 MCG tablet, Take 1 tablet by mouth once daily on Monday, Wednesday and Friday and take 137 mcg on the other days., Disp: 78 tablet, Rfl: 1 .  metFORMIN (GLUCOPHAGE-XR) 500 MG 24 hr tablet, Take 1 tablet (500 mg total) by mouth daily with breakfast., Disp: 90 tablet, Rfl: 1 .  olopatadine (PATANOL) 0.1 % ophthalmic solution, Place 1 drop into both eyes as needed for allergies., Disp: , Rfl:  .  VICTOZA 18 MG/3ML SOPN, Inject 1.2 mg into the skin  daily., Disp: 6 mL, Rfl: 2 .  amLODipine-valsartan (EXFORGE) 10-160 MG tablet, Take 1 tablet by mouth daily., Disp: 90 tablet, Rfl: 3 .  meclizine (ANTIVERT) 25 MG tablet, Take 1 tablet (25 mg total) by mouth Three (3) times a day as needed for dizziness. (Patient not taking: Reported on 10/10/2019), Disp: 30 tablet, Rfl: 1  Allergies  Allergen Reactions  . Atorvastatin Other (See Comments)    Other reaction(s): Muscle Pain  . Hydralazine Other (See Comments)    Aggravated gout Aggravated gout  . Hydrochlorothiazide Other (See Comments)    Aggravated gout  . Lisinopril Cough  . Pravastatin Other (See Comments)    Other reaction(s): Muscle Pain  . Rosuvastatin Other (See Comments)    Other reaction(s): Muscle Pain    Patient Care Team: Delsa Grana, PA-C as PCP - General (Family Medicine) Kate Sable, MD as PCP - Cardiology (Cardiology) Lada, Satira Anis, MD as Attending Physician (Family Medicine) Bary Castilla, Forest Gleason, MD (General Surgery) Clyde Canterbury, MD as Referring Physician (Otolaryngology) Estill Cotta, MD (Ophthalmology) Dionne Ano, MD as Referring Physician (Internal Medicine)  Review of Systems  10 Systems reviewed and are negative for acute change except as noted in the HPI.   I personally reviewed active problem list, medication list, allergies, family history, social history, health maintenance, notes from last encounter, lab results, imaging with the patient/caregiver today.        Objective:   Vitals:  Vitals:   10/10/19 0900  BP: 124/82  Pulse: 65  Resp: 16  Temp: 98.3 F (36.8 C)  TempSrc: Oral  SpO2: 94%  Weight: 257 lb 8 oz (116.8 kg)  Height: 5' 4" (1.626 m)    Body mass index is 44.2 kg/m.  Physical Exam Vitals and nursing note reviewed.  Constitutional:      General: She is not in acute distress.    Appearance: Normal appearance. She is well-developed. She is obese. She is not ill-appearing, toxic-appearing or diaphoretic.       Interventions: Face mask in  place.  HENT:     Head: Normocephalic and atraumatic.     Right Ear: Tympanic membrane, ear canal and external ear normal.     Left Ear: Tympanic membrane, ear canal and external ear normal.     Nose: Congestion and rhinorrhea present.  Eyes:     General: Lids are normal. No scleral icterus.       Right eye: No discharge.        Left eye: No discharge.     Conjunctiva/sclera: Conjunctivae normal.     Pupils: Pupils are equal, round, and reactive to light.  Neck:     Trachea: Phonation normal. No tracheal deviation.  Cardiovascular:     Rate and Rhythm: Normal rate and regular rhythm.     Pulses: Normal pulses.          Radial pulses are 2+ on the right side and 2+ on the left side.       Posterior tibial pulses are 2+ on the right side and 2+ on the left side.     Heart sounds: Normal heart sounds. No murmur heard.  No friction rub. No gallop.   Pulmonary:     Effort: Pulmonary effort is normal. No respiratory distress.     Breath sounds: Normal breath sounds. No stridor. No wheezing, rhonchi or rales.  Chest:     Chest wall: No tenderness.  Abdominal:     General: Bowel sounds are normal. There is no distension.     Palpations: Abdomen is soft.     Tenderness: There is abdominal tenderness. There is no right CVA tenderness, left CVA tenderness or guarding.  Musculoskeletal:     Cervical back: Normal range of motion. Rigidity present.     Right lower leg: No edema.     Left lower leg: No edema.  Skin:    General: Skin is warm and dry.     Capillary Refill: Capillary refill takes less than 2 seconds.     Coloration: Skin is not jaundiced or pale.     Findings: No rash.  Neurological:     General: No focal deficit present.     Mental Status: She is alert.     Sensory: No sensory deficit.     Motor: No weakness or abnormal muscle tone.     Coordination: Coordination normal.     Gait: Gait normal.  Psychiatric:        Mood and Affect: Mood  normal.        Speech: Speech normal.        Behavior: Behavior normal.       Fall Risk: Fall Risk  10/10/2019 03/31/2019 11/08/2018 08/06/2018 07/23/2018  Falls in the past year? 0 0 0 0 0  Number falls in past yr: 0 0 0 0 0  Injury with Fall? 0 0 0 0 0  Follow up Falls evaluation completed - - - -    Functional Status Survey: Is the patient deaf or have difficulty hearing?: Yes Does the patient have difficulty seeing, even when wearing glasses/contacts?: No Does the patient have difficulty concentrating, remembering, or making decisions?: No Does the patient have difficulty walking or climbing stairs?: No Does the patient have difficulty dressing or bathing?: No Does the patient have difficulty doing errands alone such as visiting a doctor's office or shopping?: No   Assessment & Plan:    CPE completed today  . USPSTF grade A and B recommendations reviewed with patient; age-appropriate recommendations, preventive care, screening  tests, etc discussed and encouraged; healthy living encouraged; see AVS for patient education given to patient  . Discussed importance of 150 minutes of physical activity weekly, AHA exercise recommendations given to pt in AVS/handout  . Discussed importance of healthy diet:  eating lean meats and proteins, avoiding trans fats and saturated fats, avoid simple sugars and excessive carbs in diet, eat 6 servings of fruit/vegetables daily and drink plenty of water and avoid sweet beverages.    . Recommended pt to do annual eye exam and routine dental exams/cleanings  . Depression, alcohol, fall screening completed as documented above and per flowsheets  . Reviewed Health Maintenance: Health Maintenance  Topic Date Due  . MAMMOGRAM  03/30/2020 (Originally 10/30/2018)  . TETANUS/TDAP  10/09/2020 (Originally 12/05/1979)  . FOOT EXAM  03/30/2020  . HEMOGLOBIN A1C  04/09/2020  . OPHTHALMOLOGY EXAM  05/19/2020  . COLONOSCOPY  08/14/2022  . INFLUENZA  VACCINE  Completed  . PNEUMOCOCCAL POLYSACCHARIDE VACCINE AGE 56-64 HIGH RISK  Completed  . COVID-19 Vaccine  Completed  . Hepatitis C Screening  Completed  . HIV Screening  Completed    . Immunizations: Immunization History  Administered Date(s) Administered  . Influenza-Unspecified 11/26/2013, 10/10/2014, 10/07/2019  . PFIZER SARS-COV-2 Vaccination 01/01/2019, 01/22/2019  . Pneumococcal Polysaccharide-23 04/06/2014  . Zoster Recombinat (Shingrix) 10/11/2016, 01/21/2018        ICD-10-CM   1. Annual physical exam  Z00.00 Lipid panel    COMPLETE METABOLIC PANEL WITH GFR    CBC w/Diff/Platelet    Hemoglobin A1c    TSH  2. Screening for colon cancer  Z12.11   3. Essential hypertension  W09 COMPLETE METABOLIC PANEL WITH GFR   Stable, well controlled, blood pressure at goal today  4. Mixed hyperlipidemia  E78.2 Lipid panel    COMPLETE METABOLIC PANEL WITH GFR   Intolerant of statins due to statin myalgia, on Praluent and Zetia per cardiology  5. Encounter for screening mammogram for malignant neoplasm of breast  Z12.31   6. Diabetes mellitus type 2 in obese (H[facility])  E11.69 Lipid panel   W11.9 COMPLETE METABOLIC PANEL WITH GFR    Hemoglobin A1c   Has been well controlled on Victoza and Metformin  7. Atrial paroxysmal tachycardia ([place])  I47.1    Per cardiology  8. Hypothyroidism, unspecified type  E03.9 TSH   No symptoms - due for tsh recheck  9. Vertigo  R42 Ambulatory referral to ENT   Vertigo, suspect BPPV, on meclizine, reproducible with head movements - consult ENT - may be able to improve with ENT/or PT     Delsa Grana, PA-C 10/15/19 2:51 PM  [facility]

## 2019-10-10 ENCOUNTER — Encounter: Payer: Self-pay | Admitting: Family Medicine

## 2019-10-10 ENCOUNTER — Ambulatory Visit (INDEPENDENT_AMBULATORY_CARE_PROVIDER_SITE_OTHER): Payer: BC Managed Care – PPO | Admitting: Family Medicine

## 2019-10-10 ENCOUNTER — Other Ambulatory Visit: Payer: Self-pay

## 2019-10-10 VITALS — BP 124/82 | HR 65 | Temp 98.3°F | Resp 16 | Ht 64.0 in | Wt 257.5 lb

## 2019-10-10 DIAGNOSIS — Z0000 Encounter for general adult medical examination without abnormal findings: Secondary | ICD-10-CM | POA: Diagnosis not present

## 2019-10-10 DIAGNOSIS — Z1231 Encounter for screening mammogram for malignant neoplasm of breast: Secondary | ICD-10-CM

## 2019-10-10 DIAGNOSIS — E1169 Type 2 diabetes mellitus with other specified complication: Secondary | ICD-10-CM

## 2019-10-10 DIAGNOSIS — Z1211 Encounter for screening for malignant neoplasm of colon: Secondary | ICD-10-CM

## 2019-10-10 DIAGNOSIS — E782 Mixed hyperlipidemia: Secondary | ICD-10-CM | POA: Diagnosis not present

## 2019-10-10 DIAGNOSIS — I10 Essential (primary) hypertension: Secondary | ICD-10-CM

## 2019-10-10 DIAGNOSIS — R42 Dizziness and giddiness: Secondary | ICD-10-CM

## 2019-10-10 DIAGNOSIS — E039 Hypothyroidism, unspecified: Secondary | ICD-10-CM

## 2019-10-10 DIAGNOSIS — E669 Obesity, unspecified: Secondary | ICD-10-CM

## 2019-10-10 DIAGNOSIS — I471 Supraventricular tachycardia: Secondary | ICD-10-CM

## 2019-10-10 NOTE — Patient Instructions (Addendum)

## 2019-10-11 LAB — CBC WITH DIFFERENTIAL/PLATELET
Absolute Monocytes: 470 cells/uL (ref 200–950)
Basophils Absolute: 49 cells/uL (ref 0–200)
Basophils Relative: 0.9 %
Eosinophils Absolute: 151 cells/uL (ref 15–500)
Eosinophils Relative: 2.8 %
HCT: 42.7 % (ref 35.0–45.0)
Hemoglobin: 14.1 g/dL (ref 11.7–15.5)
Lymphs Abs: 2419 cells/uL (ref 850–3900)
MCH: 29.9 pg (ref 27.0–33.0)
MCHC: 33 g/dL (ref 32.0–36.0)
MCV: 90.5 fL (ref 80.0–100.0)
MPV: 8.9 fL (ref 7.5–12.5)
Monocytes Relative: 8.7 %
Neutro Abs: 2311 cells/uL (ref 1500–7800)
Neutrophils Relative %: 42.8 %
Platelets: 256 10*3/uL (ref 140–400)
RBC: 4.72 10*6/uL (ref 3.80–5.10)
RDW: 13.6 % (ref 11.0–15.0)
Total Lymphocyte: 44.8 %
WBC: 5.4 10*3/uL (ref 3.8–10.8)

## 2019-10-11 LAB — TSH: TSH: 3.28 mIU/L (ref 0.40–4.50)

## 2019-10-11 LAB — COMPLETE METABOLIC PANEL WITH GFR
AG Ratio: 1.3 (calc) (ref 1.0–2.5)
ALT: 17 U/L (ref 6–29)
AST: 18 U/L (ref 10–35)
Albumin: 4.2 g/dL (ref 3.6–5.1)
Alkaline phosphatase (APISO): 75 U/L (ref 37–153)
BUN: 8 mg/dL (ref 7–25)
CO2: 27 mmol/L (ref 20–32)
Calcium: 9 mg/dL (ref 8.6–10.4)
Chloride: 105 mmol/L (ref 98–110)
Creat: 0.85 mg/dL (ref 0.50–1.05)
GFR, Est African American: 88 mL/min/{1.73_m2} (ref >=60)
GFR, Est Non African American: 76 mL/min/{1.73_m2} (ref >=60)
Globulin: 3.3 g/dL (calc) (ref 1.9–3.7)
Glucose, Bld: 94 mg/dL (ref 65–99)
Potassium: 4 mmol/L (ref 3.5–5.3)
Sodium: 140 mmol/L (ref 135–146)
Total Bilirubin: 0.4 mg/dL (ref 0.2–1.2)
Total Protein: 7.5 g/dL (ref 6.1–8.1)

## 2019-10-11 LAB — LIPID PANEL
Cholesterol: 104 mg/dL (ref <200)
HDL: 42 mg/dL — ABNORMAL LOW (ref >=50)
LDL Cholesterol (Calc): 45 mg/dL (calc)
Non-HDL Cholesterol (Calc): 62 mg/dL (calc) (ref <130)
Total CHOL/HDL Ratio: 2.5 (calc) (ref <5.0)
Triglycerides: 90 mg/dL (ref <150)

## 2019-10-11 LAB — HEMOGLOBIN A1C
Hgb A1c MFr Bld: 6.1 % of total Hgb — ABNORMAL HIGH (ref <5.7)
Mean Plasma Glucose: 128 (calc)
eAG (mmol/L): 7.1 (calc)

## 2019-10-14 ENCOUNTER — Other Ambulatory Visit: Payer: Self-pay | Admitting: Family Medicine

## 2019-10-14 MED ORDER — AMLODIPINE BESYLATE-VALSARTAN 10-160 MG PO TABS: ORAL_TABLET | ORAL | 3 refills | Status: DC

## 2019-10-23 ENCOUNTER — Other Ambulatory Visit: Payer: Self-pay | Admitting: Family Medicine

## 2019-10-23 DIAGNOSIS — R42 Dizziness and giddiness: Secondary | ICD-10-CM

## 2019-10-23 NOTE — Telephone Encounter (Signed)
Requested medication (s) are due for refill today: yes  Requested medication (s) are on the active medication list: Yes  Last refill:  08/26/19  Future visit scheduled: yes  Notes to clinic:  not delegated    Requested Prescriptions  Pending Prescriptions Disp Refills   meclizine (ANTIVERT) 25 MG tablet [Pharmacy Med Name: meclizine 25 mg tablet (ANTIVERT)] 30 tablet 1    Sig: Take 1 tablet (25 mg total) by mouth Three (3) times a day as needed for dizziness.      Not Delegated - Gastroenterology: Antiemetics Failed - 10/23/2019  7:07 AM      Failed - This refill cannot be delegated      Passed - Valid encounter within last 6 months    Recent Outpatient Visits           1 week ago Annual physical exam   [facility] Delsa Grana, PA-C   6 months ago Mixed hyperlipidemia   [facility] Delsa Grana, PA-C   11 months ago Diabetes mellitus type 2 in obese [facility])   [facility] Delsa Grana, PA-C   1 year ago Essential hypertension   [place], NP   1 year ago Essential hypertension, benign   [place], [place], NP       Future Appointments             In 1 month Agbor-Etang, Aaron Edelman, MD [facility], [place]   In 5 months Delsa Grana, PA-C [facility], [facility]

## 2019-11-20 ENCOUNTER — Other Ambulatory Visit: Payer: Self-pay | Admitting: Family Medicine

## 2019-11-20 DIAGNOSIS — E785 Hyperlipidemia, unspecified: Secondary | ICD-10-CM

## 2019-11-27 ENCOUNTER — Other Ambulatory Visit: Payer: Self-pay

## 2019-11-27 ENCOUNTER — Ambulatory Visit: Payer: BC Managed Care – PPO | Admitting: Cardiology

## 2019-11-27 ENCOUNTER — Encounter: Payer: Self-pay | Admitting: Cardiology

## 2019-11-27 VITALS — BP 110/70 | HR 58 | Ht 64.0 in | Wt 262.4 lb

## 2019-11-27 DIAGNOSIS — E785 Hyperlipidemia, unspecified: Secondary | ICD-10-CM

## 2019-11-27 DIAGNOSIS — I2510 Atherosclerotic heart disease of native coronary artery without angina pectoris: Secondary | ICD-10-CM

## 2019-11-27 DIAGNOSIS — I10 Essential (primary) hypertension: Secondary | ICD-10-CM

## 2019-11-27 DIAGNOSIS — I4719 Other supraventricular tachycardia: Secondary | ICD-10-CM

## 2019-11-27 DIAGNOSIS — I471 Supraventricular tachycardia: Secondary | ICD-10-CM

## 2019-11-27 MED ORDER — FUROSEMIDE 20 MG PO TABS
ORAL_TABLET | ORAL | 3 refills | Status: DC
Start: 2019-11-27 — End: 2022-02-24

## 2019-11-27 MED ORDER — PRALUENT 75 MG/ML ~~LOC~~ SOAJ: 75.0000 mg | SUBCUTANEOUS | 3 refills | Status: DC

## 2019-11-27 NOTE — Patient Instructions (Addendum)
Medication Instructions:  Your physician recommends that you continue on your current medications as directed. Please refer to the Current Medication list given to you today.  Take your Lasix only when needed.  *If you need a refill on your cardiac medications before your next appointment, please call your pharmacy*   Lab Work: None Ordered If you have labs (blood work) drawn today and your tests are completely normal, you will receive your results only by: Marland Kitchen MyChart Message (if you have MyChart) OR . A paper copy in the mail If you have any lab test that is abnormal or we need to change your treatment, we will call you to review the results.   Testing/Procedures: None Ordered   Follow-Up: At [facility], you and your health needs are our priority.  As part of our continuing mission to provide you with exceptional heart care, we have created designated Provider Care Teams.  These Care Teams include your primary Cardiologist (physician) and Advanced Practice Providers (APPs -  Physician Assistants and Nurse Practitioners) who all work together to provide you with the care you need, when you need it.  We recommend signing up for the patient portal called "MyChart".  Sign up information is provided on this After Visit Summary.  MyChart is used to connect with patients for Virtual Visits (Telemedicine).  Patients are able to view lab/test results, encounter notes, upcoming appointments, etc.  Non-urgent messages can be sent to your provider as well.   To learn more about what you can do with MyChart, go to [email or web address].    Your next appointment:   1 year(s)  The format for your next appointment:   In Person  Provider:   Kate Sable, MD   Other Instructions

## 2019-11-27 NOTE — Progress Notes (Signed)
Cardiology Office Note:    Date:  11/27/2019   ID:  Elizabeth Russo, DOB September 21, 1960, MRN [id number]  PCP:  Delsa Grana, PA-C  Cardiologist:  Kate Sable, MD  Electrophysiologist:  None   Referring MD: Delsa Grana, PA-C    Chief Complaint  Patient presents with  . other    6 month f/u no complaints today. Meds reviewed verbally with pt.    History of Present Illness:    Elizabeth Russo is a 59 y.o. female with a hx of atrial tachycardia, hypertension, CAD,(NSTEMI with PCI/DES to OM2, mid RCA occ, January 2013),  who presents for follow-up.    Tolerating medications as prescribed.  Currently on Coreg for atrial tachycardia.  Did not tolerate verapamil in the past.  Denies any chest pain or shortness of breath.  Rarely has symptoms of palpitations.  Feels well, has no concerns at this time.  Last lipid panel was about a month ago with LDL well controlled.  Prior notes Patient used to follow-up at [facility].  She had bilateral arm and neck pain followed by dyspnea on exertion in January 2013.  She was diagnosed with NSTEMI.  Left heart cath was performed which showed an occluded mid RCA, 90% stenosis in a small OM1 branch.  50% proximal RCA, distal LAD large OM 2.  PCI was performed to the OM 2 branch with a drug-eluting stent.  In 2018, she had a stress echocardiogram, exercising for 6 minutes achieving 10.1 METS with no wall motion abnormalities.  Last echocardiogram in 2013 showed EF of 45%, with mild concentric LVH.  She has a history of myalgia on statin.  She is currently taking Zetia and Praluent for hyperlipidemia..  Echo 12/2018 showed preserved ejection fraction, EF 60 to 65%, impaired relaxation.  Did not tolerate verapamil in the past, Coreg was started. .  Past Medical History:  Diagnosis Date  . Atrial fibrillation ([place]) 02/13/2011  . Breast mass, right 04/25/2014  . CAD (coronary artery disease)   . Diabetes mellitus without complication ([place])   .  Diverticulosis   . Family history of colon cancer in father   . Goiter 03/19/2014  . Hearing loss 08/25/2011  . Hypertension   . Hypothyroid   . Localized enlarged lymph nodes 10/23/2014   cervical   . Low back pain 12/25/2014  . Morbid obesity ([place]) 06/26/2014  . Myalgia due to statin 04/13/2017   Intolerant to three different statins  . Personal history of colonic polyps   . Vertigo     Past Surgical History:  Procedure Laterality Date  . ABDOMINAL HYSTERECTOMY    . COLONOSCOPY WITH PROPOFOL N/A 08/13/2017   Procedure: COLONOSCOPY WITH PROPOFOL;  Surgeon: Lin Landsman, MD;  Location: [facility] ENDOSCOPY;  Service: Gastroenterology;  Laterality: N/A;  . heart stent  2012    Current Medications: Current Meds  Medication Sig  . amLODipine-valsartan (EXFORGE) 10-160 MG tablet Take 1 tablet by mouth daily.  Marland Kitchen aspirin EC 81 MG tablet Take 81 mg by mouth daily.   . carvedilol (COREG) 25 MG tablet Take 0.5 tablets (12.5 mg total) by mouth 2 (two) times daily with a meal.  . diclofenac sodium (VOLTAREN) 1 % GEL Apply 2 g topically as needed.   . ezetimibe (ZETIA) 10 MG tablet Take 1 tablet (10 mg total) by mouth daily.  . famotidine (PEPCID) 20 MG tablet Take 1 tablet (20 mg total) by mouth 2 (two) times daily.  . fluticasone (FLONASE) 50 MCG/ACT nasal  spray Place 2 sprays into both nostrils daily. (Patient taking differently: Place 2 sprays into both nostrils as needed. )  . furosemide (LASIX) 20 MG tablet Take when needed for Edema.  Marland Kitchen glucose blood test strip Use as instructed  . Insulin Pen Needle (BD PEN NEEDLE NANO U/F) 32G X 4 MM MISC Use for insulin administration four times daily.  Marland Kitchen levothyroxine (SYNTHROID) 137 MCG tablet Take 1 tablet by mouth daily before breakfast on Tuesday, Thursday, Saturday and Sunday  . levothyroxine (SYNTHROID) 150 MCG tablet Take 1 tablet by mouth once daily on Monday, Wednesday and Friday and take 137 mcg on the other days.  . meclizine (ANTIVERT) 25  MG tablet Take 1 tablet (25 mg total) by mouth Three (3) times a day as needed for dizziness.  . metFORMIN (GLUCOPHAGE-XR) 500 MG 24 hr tablet Take 1 tablet (500 mg total) by mouth daily with breakfast.  . olopatadine (PATANOL) 0.1 % ophthalmic solution Place 1 drop into both eyes as needed for allergies.  Marland Kitchen VICTOZA 18 MG/3ML SOPN Inject 1.2 mg into the skin daily.  . [DISCONTINUED] furosemide (LASIX) 20 MG tablet Take 1 tablet (20 mg) by mouth once daily for 5 days, then take 1 tablet (20 mg) once every other day (Patient taking differently: Take 1 tablet (20 mg) once every other day)     Allergies:   Atorvastatin, Hydralazine, Hydrochlorothiazide, Lisinopril, Pravastatin, and Rosuvastatin   Social History   Socioeconomic History  . Marital status: Widowed    Spouse name: Not on file  . Number of children: 2  . Years of education: Not on file  . Highest education level: High school graduate  Occupational History  . Not on file  Tobacco Use  . Smoking status: Former Smoker    Packs/day: 0.50    Years: 30.00    Pack years: 15.00    Types: Cigarettes    Start date: 01/09/1978    Quit date: 03/09/2013    Years since quitting: 6.7  . Smokeless tobacco: Never Used  Vaping Use  . Vaping Use: Never used  Substance and Sexual Activity  . Alcohol use: Yes    Alcohol/week: 0.0 standard drinks    Comment: Socially   . Drug use: No  . Sexual activity: Yes    Birth control/protection: Post-menopausal  Other Topics Concern  . Not on file  Social History Narrative  . Not on file   Social Determinants of Health   Financial Resource Strain: Low Risk   . Difficulty of Paying Living Expenses: Not hard at all  Food Insecurity: No Food Insecurity  . Worried About Charity fundraiser in the Last Year: Never true  . Ran Out of Food in the Last Year: Never true  Transportation Needs: No Transportation Needs  . Lack of Transportation (Medical): No  . Lack of Transportation (Non-Medical): No    Physical Activity: Inactive  . Days of Exercise per Week: 0 days  . Minutes of Exercise per Session: 0 min  Stress: No Stress Concern Present  . Feeling of Stress : Not at all  Social Connections: Socially Isolated  . Frequency of Communication with Friends and Family: Once a week  . Frequency of Social Gatherings with Friends and Family: Once a week  . Attends Religious Services: More than 4 times per year  . Active Member of Clubs or Organizations: No  . Attends Archivist Meetings: Never  . Marital Status: Widowed     Family History:  The patient's family history includes Arthritis in her sister; Cancer in her father and maternal grandfather; Diabetes in her father, mother, sister, and sister; Heart attack in her paternal grandfather; Heart disease in her mother, sister, sister, and sister; Hyperlipidemia in her mother and sister; Hypertension in her father, mother, and sister; Kidney disease in her sister; Thyroid disease in her mother.  ROS:   Please see the history of present illness.     All other systems reviewed and are negative.  EKGs/Labs/Other Studies Reviewed:     TTE 12/17/2018  1. Left ventricular ejection fraction, by visual estimation, is 60 to 65%. The left ventricle has normal function. There is mildly increased left ventricular hypertrophy.  2. Left ventricular diastolic parameters are consistent with Grade I diastolic dysfunction (impaired relaxation).  3. Mildly dilated left ventricular internal cavity size.  4. The left ventricle has no regional wall motion abnormalities.  5. Global right ventricle has normal systolic function.The right ventricular size is normal. No increase in right ventricular wall thickness.  6. Left atrial size was normal.  7. Right atrial size was normal.  8. Trivial pericardial effusion is present.  9. The mitral valve is normal in structure. Trace mitral valve regurgitation. 10. The tricuspid valve is normal in structure.  Tricuspid valve regurgitation is mild. 11. The aortic valve was not well visualized. Aortic valve regurgitation is not visualized. No evidence of aortic valve sclerosis or stenosis. 12. The pulmonic valve was grossly normal. Pulmonic valve regurgitation is trivial. 13. Normal pulmonary artery systolic pressure. 14. The inferior vena cava is normal in size with <50% respiratory variability, suggesting right atrial pressure of 8 mmHg. 15. The interatrial septum was not well visualized.  Cardiac monitor 11/18/2018 Patient had a min HR of 50 bpm, max HR of 190 bpm, and avg HR of 66 bpm. Predominant underlying rhythm was Sinus Rhythm. Slight P wave morphology changes were noted. 46 Supraventricular Tachycardia runs occurred, the run with the fastest interval lasting 4 beats with a max rate of 190 bpm, the longest lasting 14.6 secs (26 beats) with an avg rate of 141 bpm. Some episodes of Supraventricular Tachycardia may be possible Atrial Tachycardia with variable block. Supraventricular Tachycardia was detected within +/- 45 seconds of symptomatic patient event(s). Isolated SVEs were rare (<1.0%), SVE Couplets were rare (<1.0%), and SVE Triplets were rare (<1.0%).  Stress echocardiogram 09/20/2016 INDETERMINATE. NORMAL LA PRESSURES WITH DIASTOLIC DYSFUNCTION VALVULAR REGURGITATION: TRIVIAL MR, TRIVIAL PR NO VALVULAR STENOSIS Note: NO WMA AT SUBTARGET HR OF 126bpm(TARGET HR=140bpm) Maximum workload of 10.10 METs was achieved during exercise. RESTING HYPERTENSION - APPROPRIATE RESPONSE  02/2011 2D Echo: EF 45%, mild concentric LVH, DD gr. I, LAE 4.2cm,mild TR (PRVP 66mmHg), ascending aorta dilated to 3.9cm.  01/2016 Stress Echo: EF>55%, moderate LVH, DD gr. I, No VHD  Jan. 2013 Cardiac Cath: occluded mRCA, 90% stenosis in sm. OM1 and Large OM2, 50% stenosis in pRCA, dLAD and Large OM2. PCI of the OM2 w/ DES.    EKG:  EKG is  ordered today.  The ekg ordered today demonstrates normal sinus  bradycardia.  Recent Labs: 10/10/2019: ALT 17; BUN 8; Creat 0.85; Hemoglobin 14.1; Platelets 256; Potassium 4.0; Sodium 140; TSH 3.28  Recent Lipid Panel    Component Value Date/Time   CHOL 104 10/10/2019 1004   CHOL 184 05/04/2015 0858   TRIG 90 10/10/2019 1004   HDL 42 (L) 10/10/2019 1004   HDL 38 (L) 05/04/2015 0858   CHOLHDL 2.5 10/10/2019 1004  VLDL 21 04/04/2016 0932   LDLCALC 45 10/10/2019 1004    Physical Exam:    VS:  BP 110/70 (BP Location: Left Arm, Patient Position: Sitting, Cuff Size: Large)   Pulse (!) 58   Ht 5\' 4"  (1.626 m)   Wt 262 lb 6 oz (119 kg)   LMP  (LMP Unknown)   SpO2 99%   BMI 45.04 kg/m     Wt Readings from Last 3 Encounters:  11/27/19 262 lb 6 oz (119 kg)  10/10/19 257 lb 8 oz (116.8 kg)  05/26/19 263 lb (119.3 kg)     GEN:  Well nourished, well developed in no acute distress, obese HEENT: Normal NECK: No JVD; No carotid bruits LYMPHATICS: No lymphadenopathy CARDIAC: RRR, no murmurs, rubs, gallops RESPIRATORY:  Clear to auscultation without rales, wheezing or rhonchi  ABDOMEN: Soft, non-tender, non-distended MUSCULOSKELETAL:  No edema; No deformity  SKIN: Warm and dry NEUROLOGIC:  Alert and oriented x 3 PSYCHIATRIC:  Normal affect   ASSESSMENT:    1. Atrial tachycardia ([place])   2. Essential hypertension   3. Coronary artery disease involving native coronary artery of native heart without angina pectoris   4. Hyperlipidemia, unspecified   5. Morbid obesity ([place])    PLAN:    In order of problems listed above:  1. Patient with history of atrial tachycardia.  Very rare palpitations.  Tolerating Coreg.  Last Echo EF 60 to 65%, continue Coreg 12.5 mg twice daily. 2. History of hypertension, BP well controlled.  Continue current BP meds. 3.  CAD and drug-eluting stent to OM1.  LDL at goal.  Aspirin, Praluent 4. Hyperlipidemia, not tolerant to statins.   Zetia and Praluent. 5. Patient is morbidly obese.  Weight loss advised.  Okay to  take Lasix as needed  Follow-up in 12 months.  Total encounter time more than 35 minutes  Greater than 50% was spent in counseling and coordination of care with the patient   This note was generated in part or whole with voice recognition software. Voice recognition is usually quite accurate but there are transcription errors that can and very often do occur. I apologize for any typographical errors that were not detected and corrected.   Medication Adjustments/Labs and Tests Ordered: Current medicines are reviewed at length with the patient today.  Concerns regarding medicines are outlined above.  Orders Placed This Encounter  Procedures  . EKG 12-Lead   Meds ordered this encounter  Medications  . Alirocumab (PRALUENT) 75 MG/ML SOAJ    Sig: Inject 75 mg into the skin every 14 (fourteen) days.    Dispense:  2 mL    Refill:  3  . furosemide (LASIX) 20 MG tablet    Sig: Take when needed for Edema.    Dispense:  90 tablet    Refill:  3    Patient Instructions  Medication Instructions:  Your physician recommends that you continue on your current medications as directed. Please refer to the Current Medication list given to you today.  Take your Lasix only when needed.  *If you need a refill on your cardiac medications before your next appointment, please call your pharmacy*   Lab Work: None Ordered If you have labs (blood work) drawn today and your tests are completely normal, you will receive your results only by: Marland Kitchen MyChart Message (if you have MyChart) OR . A paper copy in the mail If you have any lab test that is abnormal or we need to change your treatment,  we will call you to review the results.   Testing/Procedures: None Ordered   Follow-Up: At [facility], you and your health needs are our priority.  As part of our continuing mission to provide you with exceptional heart care, we have created designated Provider Care Teams.  These Care Teams include your  primary Cardiologist (physician) and Advanced Practice Providers (APPs -  Physician Assistants and Nurse Practitioners) who all work together to provide you with the care you need, when you need it.  We recommend signing up for the patient portal called "MyChart".  Sign up information is provided on this After Visit Summary.  MyChart is used to connect with patients for Virtual Visits (Telemedicine).  Patients are able to view lab/test results, encounter notes, upcoming appointments, etc.  Non-urgent messages can be sent to your provider as well.   To learn more about what you can do with MyChart, go to [email or web address].    Your next appointment:   1 year(s)  The format for your next appointment:   In Person  Provider:   Kate Sable, MD   Other Instructions      Signed, Kate Sable, MD  11/27/2019 12:47 PM    [place]

## 2019-12-03 ENCOUNTER — Ambulatory Visit
Admission: RE | Admit: 2019-12-03 | Discharge: 2019-12-03 | Disposition: A | Discharge status: Home / Self Care | Payer: BC Managed Care – PPO | Source: Ambulatory Visit | Attending: Family Medicine | Admitting: Family Medicine

## 2019-12-03 ENCOUNTER — Encounter: Payer: Self-pay | Admitting: Family Medicine

## 2019-12-03 ENCOUNTER — Ambulatory Visit: Payer: BC Managed Care – PPO | Admitting: Family Medicine

## 2019-12-03 ENCOUNTER — Other Ambulatory Visit: Payer: Self-pay

## 2019-12-03 VITALS — BP 124/72 | HR 86 | Temp 98.5°F | Resp 16 | Ht 64.0 in | Wt 255.8 lb

## 2019-12-03 DIAGNOSIS — K8000 Calculus of gallbladder with acute cholecystitis without obstruction: Secondary | ICD-10-CM | POA: Diagnosis not present

## 2019-12-03 DIAGNOSIS — R1010 Upper abdominal pain, unspecified: Secondary | ICD-10-CM | POA: Diagnosis not present

## 2019-12-03 LAB — POCT URINALYSIS DIPSTICK
Bilirubin, UA: NEGATIVE
Glucose, UA: NEGATIVE
Ketones, UA: NEGATIVE
Nitrite, UA: NEGATIVE
Protein, UA: POSITIVE — AB
Spec Grav, UA: 1.015 (ref 1.010–1.025)
Urobilinogen, UA: 0.2 E.U./dL
pH, UA: 6 (ref 5.0–8.0)

## 2019-12-03 MED ORDER — METOCLOPRAMIDE HCL 10 MG PO TABS: 10.0000 mg | ORAL_TABLET | Freq: Three times a day (TID) | ORAL | 1 refills | Status: DC | PRN

## 2019-12-03 MED ORDER — PANTOPRAZOLE SODIUM 40 MG PO TBEC: 40.0000 mg | DELAYED_RELEASE_TABLET | Freq: Every day | ORAL | 1 refills | Status: DC

## 2019-12-03 MED ORDER — ONDANSETRON 4 MG PO TBDP: 4.0000 mg | ORAL_TABLET | Freq: Three times a day (TID) | ORAL | 1 refills | Status: DC | PRN

## 2019-12-03 MED ORDER — PANTOPRAZOLE SODIUM 40 MG PO TBEC: 40.0000 mg | DELAYED_RELEASE_TABLET | Freq: Two times a day (BID) | ORAL | 1 refills | Status: DC

## 2019-12-03 MED ORDER — HYDROCODONE-ACETAMINOPHEN 5-325 MG PO TABS: 0.5000 | ORAL_TABLET | Freq: Three times a day (TID) | ORAL | 0 refills | Status: DC | PRN

## 2019-12-03 NOTE — Patient Instructions (Signed)
Abdominal Pain, Adult Many things can cause belly (abdominal) pain. Most times, belly pain is not dangerous. Many cases of belly pain can be watched and treated at home. Sometimes, though, belly pain is serious. Your doctor will try to find the cause of your belly pain. Follow these instructions at home:  Medicines  Take over-the-counter and prescription medicines only as told by your doctor.  Do not take medicines that help you poop (laxatives) unless told by your doctor. General instructions  Watch your belly pain for any changes.  Drink enough fluid to keep your pee (urine) pale yellow.  Keep all follow-up visits as told by your doctor. This is important. Contact a doctor if:  Your belly pain changes or gets worse.  You are not hungry, or you lose weight without trying.  You are having trouble pooping (constipated) or have watery poop (diarrhea) for more than 2-3 days.  You have pain when you pee or poop.  Your belly pain wakes you up at night.  Your pain gets worse with meals, after eating, or with certain foods.  You are vomiting and cannot keep anything down.  You have a fever.  You have blood in your pee. Get help right away if:  Your pain does not go away as soon as your doctor says it should.  You cannot stop vomiting.  Your pain is only in areas of your belly, such as the right side or the left lower part of the belly.  You have bloody or black poop, or poop that looks like tar.  You have very bad pain, cramping, or bloating in your belly.  You have signs of not having enough fluid or water in your body (dehydration), such as: ? Dark pee, very little pee, or no pee. ? Cracked lips. ? Dry mouth. ? Sunken eyes. ? Sleepiness. ? Weakness.  You have trouble breathing or chest pain. Summary  Many cases of belly pain can be watched and treated at home.  Watch your belly pain for any changes.  Take over-the-counter and prescription medicines only as  told by your doctor.  Contact a doctor if your belly pain changes or gets worse.  Get help right away if you have very bad pain, cramping, or bloating in your belly. This information is not intended to replace advice given to you by your health care provider. Make sure you discuss any questions you have with your health care provider. Document Revised: 05/06/2018 Document Reviewed: 05/06/2018 Elsevier Patient Education  [place].   Cholelithiasis  Cholelithiasis is also called "gallstones." It is a kind of gallbladder disease. The gallbladder is an organ that stores a liquid (bile) that helps you digest fat. Gallstones may not cause symptoms (may be silent gallstones) until they cause a blockage, and then they can cause pain (gallbladder attack). Follow these instructions at home:  Take over-the-counter and prescription medicines only as told by your doctor.  Stay at a healthy weight.  Eat healthy foods. This includes: ? Eating fewer fatty foods, like fried foods. ? Eating fewer refined carbs (refined carbohydrates). Refined carbs are breads and grains that are highly processed, like white bread and white rice. Instead, choose whole grains like whole-wheat bread and brown rice. ? Eating more fiber. Almonds, fresh fruit, and beans are healthy sources of fiber.  Keep all follow-up visits as told by your doctor. This is important. Contact a doctor if:  You have sudden pain in the upper right side of your belly (  abdomen). Pain might spread to your right shoulder or your chest. This may be a sign of a gallbladder attack.  You feel sick to your stomach (are nauseous).  You throw up (vomit).  You have been diagnosed with gallstones that have no symptoms and you get: ? Belly pain. ? Discomfort, burning, or fullness in the upper part of your belly (indigestion). Get help right away if:  You have sudden pain in the upper right side of your belly, and it lasts for more than 2  hours.  You have belly pain that lasts for more than 5 hours.  You have a fever or chills.  You keep feeling sick to your stomach or you keep throwing up.  Your skin or the whites of your eyes turn yellow (jaundice).  You have dark-colored pee (urine).  You have light-colored poop (stool). Summary  Cholelithiasis is also called "gallstones."  The gallbladder is an organ that stores a liquid (bile) that helps you digest fat.  Silent gallstones are gallstones that do not cause symptoms.  A gallbladder attack may cause sudden pain in the upper right side of your belly. Pain might spread to your right shoulder or your chest. If this happens, contact your doctor.  If you have sudden pain in the upper right side of your belly that lasts for more than 2 hours, get help right away. This information is not intended to replace advice given to you by your health care provider. Make sure you discuss any questions you have with your health care provider. Document Revised: 12/08/2016 Document Reviewed: 09/12/2015 Elsevier Patient Education  2020 [employer].

## 2019-12-03 NOTE — Progress Notes (Signed)
Patient ID: Elizabeth Russo, female    DOB: March 13, 1960, 59 y.o.   MRN: [id number]  PCP: Delsa Grana, PA-C  Chief Complaint  Patient presents with  . Abdominal Pain    for 6 days    Subjective:   Elizabeth Russo is a 59 y.o. female, presents to clinic with CC of the following:  Hxc of well controlled DM - no recent med changes, hx of GERD on pepcid  Abdominal Pain This is a new problem. The current episode started in the past 7 days. The onset quality is sudden. The problem occurs constantly. The most recent episode lasted 6 days. The problem has been gradually improving. The pain is located in the generalized abdominal region and RUQ. The pain is at a severity of 4/10. Pain severity now: was severe 8/10, has improved a little to 4/10. The quality of the pain is aching. Pain radiation: around right upper side and up right chest. Associated symptoms include belching, a fever (100.4 sunday, 100.2 Monday, normal last 2 days) and nausea. Pertinent negatives include no anorexia, arthralgias, constipation, diarrhea, dysuria, flatus, frequency, headaches, hematochezia, hematuria, melena, myalgias, vomiting or weight loss. Associated symptoms comments: Associated nausea indigestion some reflux and increased belching and stomach rumbling, bowel movements are normal denies any urinary symptoms denies flank pain. The pain is aggravated by eating (And sitting or leaning forward). Relieved by: Standing up. She has tried nothing for the symptoms. Her past medical history is significant for GERD. There is no history of abdominal surgery, gallstones, irritable bowel syndrome, pancreatitis or PUD.      Patient Active Problem List   Diagnosis Date Noted  . Statin intolerance 03/31/2019  . Heart failure with preserved left ventricular function (HFpEF) ([place]) 03/31/2019  . Atrial paroxysmal tachycardia ([place]) 03/31/2019  . 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 Urinary incontinence 03/31/2019  . Myalgia due to statin 04/13/2017  . Venous stasis 04/13/2017  . History of non-ST elevation myocardial infarction (NSTEMI) 02/05/2015  . Hx of percutaneous transluminal coronary angioplasty 02/05/2015  . Gout of knee 12/25/2014  . Coronary atherosclerosis 12/19/2013  . Hyperlipidemia 12/19/2013  . Essential hypertension 12/18/2013  . Diabetes mellitus type 2 in obese ([place]) 12/18/2013  . Hypothyroidism 12/18/2013      Current Outpatient Medications:  .  Alirocumab (PRALUENT) 75 MG/ML SOAJ, Inject 75 mg into the skin every 14 (fourteen) days., Disp: 2 mL, Rfl: 3 .  amLODipine-valsartan (EXFORGE) 10-160 MG tablet, Take 1 tablet by mouth daily., Disp: 90 tablet, Rfl: 3 .  aspirin EC 81 MG tablet, Take 81 mg by mouth daily. , Disp: , Rfl:  .  carvedilol (COREG) 25 MG tablet, Take 0.5 tablets (12.5 mg total) by mouth 2 (two) times daily with a meal., Disp: 90 tablet, Rfl: 1 .  diclofenac sodium (VOLTAREN) 1 % GEL, Apply 2 g topically as needed. , Disp: , Rfl:  .  ezetimibe (ZETIA) 10 MG tablet, Take 1 tablet (10 mg total) by mouth daily., Disp: 30 tablet, Rfl: 11 .  famotidine (PEPCID) 20 MG tablet, Take 1 tablet (20 mg total) by mouth 2 (two) times daily., Disp: 180 tablet, Rfl: 1 .  fluticasone (FLONASE) 50 MCG/ACT nasal spray, Place 2 sprays into both nostrils daily. (Patient taking differently: Place 2 sprays into both nostrils as needed. ), Disp: 16 g, Rfl: 2 .  furosemide (LASIX) 20 MG tablet, Take  when needed for Edema., Disp: 90 tablet, Rfl: 3 .  glucose blood test strip, Use as instructed, Disp: 100 each, Rfl: 12 .  Insulin Pen Needle (BD PEN NEEDLE NANO U/F) 32G X 4 MM MISC, Use for insulin administration four times daily., Disp: 200 each, Rfl: 1 .  levothyroxine (SYNTHROID) 137 MCG tablet, Take 1 tablet by mouth daily before breakfast on Tuesday, Thursday, Saturday and Sunday, Disp: 17 tablet, Rfl: 11 .   levothyroxine (SYNTHROID) 150 MCG tablet, Take 1 tablet by mouth once daily on Monday, Wednesday and Friday and take 137 mcg on the other days., Disp: 78 tablet, Rfl: 1 .  meclizine (ANTIVERT) 25 MG tablet, Take 1 tablet (25 mg total) by mouth Three (3) times a day as needed for dizziness., Disp: 30 tablet, Rfl: 1 .  metFORMIN (GLUCOPHAGE-XR) 500 MG 24 hr tablet, Take 1 tablet (500 mg total) by mouth daily with breakfast., Disp: 90 tablet, Rfl: 1 .  olopatadine (PATANOL) 0.1 % ophthalmic solution, Place 1 drop into both eyes as needed for allergies., Disp: , Rfl:  .  VICTOZA 18 MG/3ML SOPN, Inject 1.2 mg into the skin daily., Disp: 6 mL, Rfl: 2   Allergies  Allergen Reactions  . Atorvastatin Other (See Comments)    Other reaction(s): Muscle Pain  . Hydralazine Other (See Comments)    Aggravated gout Aggravated gout  . Hydrochlorothiazide Other (See Comments)    Aggravated gout  . Lisinopril Cough  . Pravastatin Other (See Comments)    Other reaction(s): Muscle Pain  . Rosuvastatin Other (See Comments)    Other reaction(s): Muscle Pain     Social History   Tobacco Use  . Smoking status: Former Smoker    Packs/day: 0.50    Years: 30.00    Pack years: 15.00    Types: Cigarettes    Start date: 01/09/1978    Quit date: 03/09/2013    Years since quitting: 6.7  . Smokeless tobacco: Never Used  Vaping Use  . Vaping Use: Never used  Substance Use Topics  . Alcohol use: Yes    Alcohol/week: 0.0 standard drinks    Comment: Socially   . Drug use: No      Chart Review Today: I personally reviewed active problem list, medication list, allergies, family history, social history, health maintenance, notes from last encounter, lab results, imaging with the patient/caregiver today.   Review of Systems  Constitutional: Positive for fever (100.4 sunday, 100.2 Monday, normal last 2 days). Negative for weight loss.  Gastrointestinal: Positive for abdominal pain and nausea. Negative for  anorexia, constipation, diarrhea, flatus, hematochezia, melena and vomiting.  Genitourinary: Negative for dysuria, frequency and hematuria.  Musculoskeletal: Negative for arthralgias and myalgias.  Neurological: Negative for headaches.       Objective:   Vitals:   12/03/19 1052  BP: 124/72  Pulse: 86  Resp: 16  Temp: 98.5 F (36.9 C)  TempSrc: Oral  SpO2: 98%  Weight: 255 lb 12.8 oz (116 kg)  Height: 5\' 4"  (1.626 m)    Body mass index is 43.91 kg/m.  Physical Exam Vitals and nursing note reviewed.  Constitutional:      General: She is not in acute distress.    Appearance: Normal appearance. She is well-developed. She is obese. She is not ill-appearing, toxic-appearing or diaphoretic.     Interventions: Face mask in place.     Comments: Well appearing female  HENT:     Head: Normocephalic and atraumatic.     Right  Ear: External ear normal.     Left Ear: External ear normal.  Eyes:     General: Lids are normal. No scleral icterus.       Right eye: No discharge.        Left eye: No discharge.     Conjunctiva/sclera: Conjunctivae normal.  Neck:     Trachea: Phonation normal. No tracheal deviation.  Cardiovascular:     Rate and Rhythm: Normal rate and regular rhythm.     Pulses: Normal pulses.          Radial pulses are 2+ on the right side and 2+ on the left side.       Posterior tibial pulses are 2+ on the right side and 2+ on the left side.     Heart sounds: Normal heart sounds. No murmur heard.  No friction rub. No gallop.   Pulmonary:     Effort: Pulmonary effort is normal. No respiratory distress.     Breath sounds: Normal breath sounds. No stridor. No wheezing, rhonchi or rales.  Chest:     Chest wall: No tenderness.  Abdominal:     General: Abdomen is protuberant. Bowel sounds are normal. There is no distension.     Palpations: Abdomen is soft.     Tenderness: There is abdominal tenderness in the right upper quadrant and epigastric area. There is guarding.  There is no right CVA tenderness, left CVA tenderness or rebound. Positive signs include Murphy's sign. Negative signs include McBurney's sign.     Hernia: No hernia is present.  Musculoskeletal:     Right lower leg: No edema.     Left lower leg: No edema.  Skin:    General: Skin is warm and dry.     Coloration: Skin is not jaundiced or pale.     Findings: No rash.  Neurological:     Mental Status: She is alert.     Motor: No abnormal muscle tone.     Gait: Gait normal.  Psychiatric:        Mood and Affect: Mood normal.        Speech: Speech normal.        Behavior: Behavior normal.      Results for orders placed or performed in visit on 10/10/19  Lipid panel  Result Value Ref Range   Cholesterol 104 <200 mg/dL   HDL 42 (L) > OR = 50 mg/dL   Triglycerides 90 <150 mg/dL   LDL Cholesterol (Calc) 45 mg/dL (calc)   Total CHOL/HDL Ratio 2.5 <5.0 (calc)   Non-HDL Cholesterol (Calc) 62 <130 mg/dL (calc)  COMPLETE METABOLIC PANEL WITH GFR  Result Value Ref Range   Glucose, Bld 94 65 - 99 mg/dL   BUN 8 7 - 25 mg/dL   Creat 0.85 0.50 - 1.05 mg/dL   GFR, Est Non African American 76 > OR = 60 mL/min/1.80m2   GFR, Est African American 88 > OR = 60 mL/min/1.81m2   BUN/Creatinine Ratio NOT APPLICABLE 6 - 22 (calc)   Sodium 140 135 - 146 mmol/L   Potassium 4.0 3.5 - 5.3 mmol/L   Chloride 105 98 - 110 mmol/L   CO2 27 20 - 32 mmol/L   Calcium 9.0 8.6 - 10.4 mg/dL   Total Protein 7.5 6.1 - 8.1 g/dL   Albumin 4.2 3.6 - 5.1 g/dL   Globulin 3.3 1.9 - 3.7 g/dL (calc)   AG Ratio 1.3 1.0 - 2.5 (calc)   Total Bilirubin 0.4 0.2 -  1.2 mg/dL   Alkaline phosphatase (APISO) 75 37 - 153 U/L   AST 18 10 - 35 U/L   ALT 17 6 - 29 U/L  CBC w/Diff/Platelet  Result Value Ref Range   WBC 5.4 3.8 - 10.8 Thousand/uL   RBC 4.72 3.80 - 5.10 Million/uL   Hemoglobin 14.1 11.7 - 15.5 g/dL   HCT 42.7 35 - 45 %   MCV 90.5 80.0 - 100.0 fL   MCH 29.9 27.0 - 33.0 pg   MCHC 33.0 32.0 - 36.0 g/dL   RDW 13.6  11.0 - 15.0 %   Platelets 256 140 - 400 Thousand/uL   MPV 8.9 7.5 - 12.5 fL   Neutro Abs 2,311 1,500 - 7,800 cells/uL   Lymphs Abs 2,419 850 - 3,900 cells/uL   Absolute Monocytes 470 200 - 950 cells/uL   Eosinophils Absolute 151 15.0 - 500.0 cells/uL   Basophils Absolute 49 0.0 - 200.0 cells/uL   Neutrophils Relative % 42.8 %   Total Lymphocyte 44.8 %   Monocytes Relative 8.7 %   Eosinophils Relative 2.8 %   Basophils Relative 0.9 %  Hemoglobin A1c  Result Value Ref Range   Hgb A1c MFr Bld 6.1 (H) <5.7 % of total Hgb   Mean Plasma Glucose 128 (calc)   eAG (mmol/L) 7.1 (calc)  TSH  Result Value Ref Range   TSH 3.28 0.40 - 4.50 mIU/L       Assessment & Plan:     ICD-10-CM   1. Pain of upper abdomen  R10.10 POCT urinalysis dipstick    US ABDOMEN LIMITED RUQ (LIVER/GB)    metoCLOPramide (REGLAN) 10 MG tablet    COMPLETE METABOLIC PANEL WITH GFR    CBC with Differential/Platelet    pantoprazole (PROTONIX) 40 MG tablet    Ambulatory referral to General Surgery    CANCELED: Ambulatory referral to General Surgery   sudden onset 6 d ago, then constant epigastric to RUQ abd pain, worse with eating, ddx cholelithiasis, vs GERD/gastritis/PUD  2. Acute cholecystitis due to biliary calculus  K80.00 US ABDOMEN LIMITED RUQ (LIVER/GB)    COMPLETE METABOLIC PANEL WITH GFR    CBC with Differential/Platelet    HYDROcodone-acetaminophen (NORCO/VICODIN) 5-325 MG tablet    ondansetron (ZOFRAN ODT) 4 MG disintegrating tablet    Ambulatory referral to General Surgery    CANCELED: Ambulatory referral to General Surgery   called pt, reviewed US findings, avoid fatty foods, antiemetics and pain meds to manage, we'll get outpt surgical f/up set up, ER precautions reviewed   I received a call from radiology with RUQ [place] results: Narrative & Impression  CLINICAL DATA:  Right upper quadrant abdominal pain  EXAM: ULTRASOUND ABDOMEN LIMITED RIGHT UPPER QUADRANT  COMPARISON:  Ultrasound  01/10/2013  FINDINGS: Gallbladder:  Numerous small layering gallstones in the gallbladder with associated acoustic shadowing. The largest calculus measures 4 mm. There is also gallbladder wall thickening and a small amount of pericholecystic fluid suggesting acute cholecystitis. Negative sonographic Murphy sign.  Common bile duct:  Diameter: 3.0 mm  Liver:  There is diffuse increased echogenicity of the liver and decreased through transmission consistent with fatty infiltration. No focal lesions or biliary dilatation. Portal vein is patent on color Doppler imaging with normal direction of blood flow towards the liver.  Other: None.  IMPRESSION: 1. Cholelithiasis along with gallbladder wall thickening and pericholecystic fluid suggesting acute cholecystitis. Nuclear medicine hepatobiliary scan may be helpful for further evaluation. 2. Fatty infiltration of the liver. 3. No intra  or extrahepatic biliary dilatation.   Electronically Signed   By: Marijo Sanes M.D.   On: 12/03/2019 13:21    Reviewed 1:32 PM did speak with pt still at imaging center Reviewed outpatient management, reviewed ER precautions She currently feels like she is tolerating the pain fine and did not want pain medicine however encouraged her to have available antiemetics and pain medicine so that she does not have to go to the ER encouraged her only to go the ER if she has uncontrollable nausea and vomiting or acute worsening of pain with nausea vomiting not improved with meds  Explained that I will get her to a general surgeon we could manage the colicky symptoms and hopefully they will be able to get her scheduled for outpatient cholecystectomy  Her labs are pending She was tender on exam today, otherwise well-appearing, had no signs of jaundice and she was tolerating POs  Encouraged her to still try ppi once daily, reglan or zofran for nausea, push clear fluids, eat bland foods, avoid  fatty, fried, greasy foods.  She was given a handout on gallstones on AVS when she left clinic - encouraged her to look over it   For any referrals after work up [facility] General [facility] Pt contact info: [phone]     Greater than 50% of this visit was spent in direct face-to-face counseling, obtaining history and physical, discussing and educating pt on treatment plan.  In office time exceeded 45 min.  Total time of this visit was 60+ min.  Working up acute abdominal pain and arranging and obtaining stat imaging today and then reviewing results and follow-up plan with the patient both during to have exam and afterwards until 1:35 PM  Today  Remainder of time involved but was not limited to reviewing chart (recent and pertinent OV notes and labs), documentation in EMR, and coordinating care and treatment plan.   Delsa Grana, PA-C 12/03/19 10:53 AM

## 2019-12-04 LAB — COMPLETE METABOLIC PANEL WITH GFR
AG Ratio: 1 (calc) (ref 1.0–2.5)
ALT: 16 U/L (ref 6–29)
AST: 16 U/L (ref 10–35)
Albumin: 3.4 g/dL — ABNORMAL LOW (ref 3.6–5.1)
Alkaline phosphatase (APISO): 63 U/L (ref 37–153)
BUN: 11 mg/dL (ref 7–25)
CO2: 28 mmol/L (ref 20–32)
Calcium: 8.7 mg/dL (ref 8.6–10.4)
Chloride: 101 mmol/L (ref 98–110)
Creat: 0.87 mg/dL (ref 0.50–1.05)
GFR, Est African American: 85 mL/min/{1.73_m2} (ref >=60)
GFR, Est Non African American: 73 mL/min/{1.73_m2} (ref >=60)
Globulin: 3.4 g/dL (calc) (ref 1.9–3.7)
Glucose, Bld: 100 mg/dL — ABNORMAL HIGH (ref 65–99)
Potassium: 3.6 mmol/L (ref 3.5–5.3)
Sodium: 136 mmol/L (ref 135–146)
Total Bilirubin: 0.5 mg/dL (ref 0.2–1.2)
Total Protein: 6.8 g/dL (ref 6.1–8.1)

## 2019-12-04 LAB — CBC WITH DIFFERENTIAL/PLATELET
Absolute Monocytes: 1119 cells/uL — ABNORMAL HIGH (ref 200–950)
Basophils Absolute: 46 cells/uL (ref 0–200)
Basophils Relative: 0.5 %
Eosinophils Absolute: 146 cells/uL (ref 15–500)
Eosinophils Relative: 1.6 %
HCT: 38.5 % (ref 35.0–45.0)
Hemoglobin: 12.7 g/dL (ref 11.7–15.5)
Lymphs Abs: 2521 cells/uL (ref 850–3900)
MCH: 29 pg (ref 27.0–33.0)
MCHC: 33 g/dL (ref 32.0–36.0)
MCV: 87.9 fL (ref 80.0–100.0)
MPV: 9.1 fL (ref 7.5–12.5)
Monocytes Relative: 12.3 %
Neutro Abs: 5269 cells/uL (ref 1500–7800)
Neutrophils Relative %: 57.9 %
Platelets: 265 10*3/uL (ref 140–400)
RBC: 4.38 10*6/uL (ref 3.80–5.10)
RDW: 12.6 % (ref 11.0–15.0)
Total Lymphocyte: 27.7 %
WBC: 9.1 10*3/uL (ref 3.8–10.8)

## 2019-12-08 ENCOUNTER — Other Ambulatory Visit: Payer: Self-pay | Admitting: Family Medicine

## 2019-12-09 ENCOUNTER — Telehealth: Payer: Self-pay

## 2019-12-09 NOTE — Telephone Encounter (Signed)
Prior Authorization initiated for Praluent 75mg /mL through [email or web address] KEY: [id number] RESPONSE:   Your demographic data has been sent to [facility] successfully!  [employer] typically takes 5-10 minutes to respond, but it may take a little longer in some cases. You will be notified by email when available. You can also check for an update later by opening this request from your dashboard. Please do not fax or call [employer] to resubmit this request. If you need assistance, please chat with CoverMyMeds or call us at [phone].

## 2019-12-12 ENCOUNTER — Telehealth: Payer: Self-pay | Admitting: Family Medicine

## 2019-12-12 MED ORDER — FLUCONAZOLE 150 MG PO TABS: 150.0000 mg | ORAL_TABLET | ORAL | 0 refills | Status: DC | PRN

## 2019-12-12 NOTE — Telephone Encounter (Signed)
Per fax received from [place], coverage for Praluent has been approved from 12/12/2019 through 12/11/2020 as long as patient remains covered by the [facility] and there are no changes to her benefits.

## 2019-12-12 NOTE — Telephone Encounter (Signed)
Pt states the abx Leisa put her on has caused her to have a yeast infection. Pt requesting abx sent to  [place], [place] Phone:  [phone]  Fax:  [phone]

## 2019-12-12 NOTE — Telephone Encounter (Signed)
Need something for a yeast infection due to antibiotic. Please send to [place] [facility]. Patient no longer at work today

## 2019-12-19 ENCOUNTER — Other Ambulatory Visit: Payer: Self-pay

## 2019-12-19 ENCOUNTER — Encounter: Payer: Self-pay | Admitting: Surgery

## 2019-12-19 ENCOUNTER — Ambulatory Visit: Payer: BC Managed Care – PPO | Admitting: Surgery

## 2019-12-19 ENCOUNTER — Telehealth: Payer: Self-pay | Admitting: Cardiology

## 2019-12-19 VITALS — BP 138/80 | HR 60 | Temp 98.7°F | Ht 64.0 in | Wt 254.4 lb

## 2019-12-19 DIAGNOSIS — K8000 Calculus of gallbladder with acute cholecystitis without obstruction: Secondary | ICD-10-CM

## 2019-12-19 NOTE — Telephone Encounter (Signed)
° °  [facility] Pre-operative Risk Assessment    HEARTCARE STAFF: - Please ensure there is not already an duplicate clearance open for this procedure. - Under Visit Info/Reason for Call, type in Other and utilize the format Clearance MM/DD/YY or Clearance TBD. Do not use dashes or single digits. - If request is for dental extraction, please clarify the # of teeth to be extracted.  Request for surgical clearance:  1. What type of surgery is being performed? Lap chole   2. When is this surgery scheduled? tbd   3. What type of clearance is required (medical clearance vs. Pharmacy clearance to hold med vs. Both)? Both   4. Are there any medications that need to be held prior to surgery and how long? Not noted   5. Practice name and name of physician performing surgery? [facility]  Dr Hampton Abbot   6. What is the office phone number? [place]   7.   What is the office fax number? [phone]  8.   Anesthesia type (None, local, MAC, general) ? Not noted    Clarisse Gouge 12/19/2019, 11:27 AM  _________________________________________________________________   (provider comments below)

## 2019-12-19 NOTE — Patient Instructions (Addendum)
We will get you scheduled for surgery. Our surgery scheduler will be in contact with you within 24-48 hours. If you have not heard anything by Tuesday, please give [place] a call. Have the blue paper with you when she calls. Please remember to stop aspirin up to 5 days before your surgery. I have faxed Delsa Grana and Dr. Thereasa Solo office for a surgery clearance. If you have any concerns or questions, please call our office.    Laparoscopic Cholecystectomy Laparoscopic cholecystectomy is surgery to remove the gallbladder. The gallbladder is a pear-shaped organ that lies beneath the liver on the right side of the body. The gallbladder stores bile, which is a fluid that helps the body to digest fats. Cholecystectomy is often done for inflammation of the gallbladder (cholecystitis). This condition is usually caused by a buildup of gallstones (cholelithiasis) in the gallbladder. Gallstones can block the flow of bile, which can result in inflammation and pain. In severe cases, emergency surgery may be required. This procedure is done though small incisions in your abdomen (laparoscopic surgery). A thin scope with a camera (laparoscope) is inserted through one incision. Thin surgical instruments are inserted through the other incisions. In some cases, a laparoscopic procedure may be turned into a type of surgery that is done through a larger incision (open surgery). Tell a health care provider about:  Any allergies you have.  All medicines you are taking, including vitamins, herbs, eye drops, creams, and over-the-counter medicines.  Any problems you or family members have had with anesthetic medicines.  Any blood disorders you have.  Any surgeries you have had.  Any medical conditions you have.  Whether you are pregnant or may be pregnant. What are the risks? Generally, this is a safe procedure. However, problems may occur, including:  Infection.  Bleeding.  Allergic reactions to  medicines.  Damage to other structures or organs.  A stone remaining in the common bile duct. The common bile duct carries bile from the gallbladder into the small intestine.  A bile leak from the cyst duct that is clipped when your gallbladder is removed. What happens before the procedure? Staying hydrated Follow instructions from your health care provider about hydration, which may include:  Up to 2 hours before the procedure - you may continue to drink clear liquids, such as water, clear fruit juice, black coffee, and plain tea. Eating and drinking restrictions Follow instructions from your health care provider about eating and drinking, which may include:  8 hours before the procedure - stop eating heavy meals or foods such as meat, fried foods, or fatty foods.  6 hours before the procedure - stop eating light meals or foods, such as toast or cereal.  6 hours before the procedure - stop drinking milk or drinks that contain milk.  2 hours before the procedure - stop drinking clear liquids. Medicines  Ask your health care provider about: ? Changing or stopping your regular medicines. This is especially important if you are taking diabetes medicines or blood thinners. ? Taking medicines such as aspirin and ibuprofen. These medicines can thin your blood. Do not take these medicines before your procedure if your health care provider instructs you not to.  You may be given antibiotic medicine to help prevent infection. General instructions  Let your health care provider know if you develop a cold or an infection before surgery.  Plan to have someone take you home from the hospital or clinic.  Ask your health care provider how your surgical  site will be marked or identified. What happens during the procedure?   To reduce your risk of infection: ? Your health care team will wash or sanitize their hands. ? Your skin will be washed with soap. ? Hair may be removed from the  surgical area.  An IV tube may be inserted into one of your veins.  You will be given one or more of the following: ? A medicine to help you relax (sedative). ? A medicine to make you fall asleep (general anesthetic).  A breathing tube will be placed in your mouth.  Your surgeon will make several small cuts (incisions) in your abdomen.  The laparoscope will be inserted through one of the small incisions. The camera on the laparoscope will send images to a TV screen (monitor) in the operating room. This lets your surgeon see inside your abdomen.  Air-like gas will be pumped into your abdomen. This will expand your abdomen to give the surgeon more room to perform the surgery.  Other tools that are needed for the procedure will be inserted through the other incisions. The gallbladder will be removed through one of the incisions.  Your common bile duct may be examined. If stones are found in the common bile duct, they may be removed.  After your gallbladder has been removed, the incisions will be closed with stitches (sutures), staples, or skin glue.  Your incisions may be covered with a bandage (dressing). The procedure may vary among health care providers and hospitals. What happens after the procedure?  Your blood pressure, heart rate, breathing rate, and blood oxygen level will be monitored until the medicines you were given have worn off.  You will be given medicines as needed to control your pain.  Do not drive for 24 hours if you were given a sedative. This information is not intended to replace advice given to you by your health care provider. Make sure you discuss any questions you have with your health care provider. Document Revised: 12/08/2016 Document Reviewed: 06/14/2015 Elsevier Patient Education  [place] If you have a gallbladder condition, you may have trouble digesting fats. Eating a low-fat diet can help reduce your  symptoms, and may be helpful before and after having surgery to remove your gallbladder (cholecystectomy). Your health care provider may recommend that you work with a diet and nutrition specialist (dietitian) to help you reduce the amount of fat in your diet. What are tips for following this plan? General guidelines  Limit your fat intake to less than 30% of your total daily calories. If you eat around 1,800 calories each day, this is less than 60 grams (g) of fat per day.  Fat is an important part of a healthy diet. Eating a low-fat diet can make it hard to maintain a healthy body weight. Ask your dietitian how much fat, calories, and other nutrients you need each day.  Eat small, frequent meals throughout the day instead of three large meals.  Drink at least 8-10 cups of fluid a day. Drink enough fluid to keep your urine clear or pale yellow.  Limit alcohol intake to no more than 1 drink a day for nonpregnant women and 2 drinks a day for men. One drink equals 12 oz of beer, 5 oz of wine, or 1 oz of hard liquor. Reading food labels  Check Nutrition Facts on food labels for the amount of fat per serving. Choose foods with less than 3  grams of fat per serving. Shopping  Choose nonfat and low-fat healthy foods. Look for the words "nonfat," "low fat," or "fat free."  Avoid buying processed or prepackaged foods. Cooking  Cook using low-fat methods, such as baking, broiling, grilling, or boiling.  Cook with small amounts of healthy fats, such as olive oil, grapeseed oil, canola oil, or sunflower oil. What foods are recommended?   All fresh, frozen, or canned fruits and vegetables.  Whole grains.  Low-fat or non-fat (skim) milk and yogurt.  Lean meat, skinless poultry, fish, eggs, and beans.  Low-fat protein supplement powders or drinks.  Spices and herbs. What foods are not recommended?  High-fat foods. These include baked goods, fast food, fatty cuts of meat, ice cream, french  toast, sweet rolls, pizza, cheese bread, foods covered with butter, creamy sauces, or cheese.  Fried foods. These include french fries, tempura, battered fish, breaded chicken, fried breads, and sweets.  Foods with strong odors.  Foods that cause bloating and gas. Summary  A low-fat diet can be helpful if you have a gallbladder condition, or before and after gallbladder surgery.  Limit your fat intake to less than 30% of your total daily calories. This is about 60 g of fat if you eat 1,800 calories each day.  Eat small, frequent meals throughout the day instead of three large meals. This information is not intended to replace advice given to you by your health care provider. Make sure you discuss any questions you have with your health care provider. Document Revised: 04/18/2018 Document Reviewed: 02/03/2016 Elsevier Patient Education  [place].     Cholecystitis  Cholecystitis is irritation and swelling (inflammation) of the gallbladder. The gallbladder is an organ that is shaped like a pear. It is under the liver on the right side of the body. This organ stores bile. Bile helps the body break down (digest) the fats in food. This condition can occur all of a sudden. It needs to be treated. What are the causes? This condition may be caused by stones or lumps that form in the gallbladder (gallstones). Gallstones can block the tube (duct) that carries bile out of your gallbladder. Other causes are:  Damage to the gallbladder due to less blood flow.  Germs in the bile ducts.  Scars or kinks in the bile ducts.  Abnormal growths (tumors) in the liver, pancreas, or gallbladder. What increases the risk? You are more likely to develop this condition if:  You have sickle cell disease.  You take birth control pills.  You use estrogen.  You have alcoholic liver disease.  You have liver cirrhosis.  You are being fed through a vein.  You are very ill.  You do not  eat or drink for a long time. This is also called "fasting."  You are overweight (obese).  You lose weight too fast.  You are pregnant.  You have high levels of fat in the blood (triglycerides).  You have irritation and swelling of the pancreas (pancreatitis). What are the signs or symptoms? Symptoms of this condition include:  Pain in the belly (abdomen). Pain is often in the upper right area of the belly.  Tenderness or bloating in the belly.  Feeling sick to your stomach (nauseous).  Throwing up (vomiting).  Fever.  Chills. How is this diagnosed? This condition may be diagnosed with a medical history and exam. You may also have other tests, such as:  Imaging tests. This may include: ? Ultrasound. ? CT scan of  the belly. ? Nuclear scan. This is also called a HIDA scan. This scan lets your doctor see the bile as it moves in your body. ? MRI.  Blood tests. These are done to check: ? Your blood count. The white blood cell count may be higher than normal. ? How well your liver works. How is this treated? This condition may be treated with:  Surgery to take out your gallbladder.  Antibiotic medicines to treat illnesses caused by germs.  Going without food for some time.  Giving fluids through an IV tube.  Medicines to treat pain or throwing up. Follow these instructions at home:  If you had surgery, follow instructions from your doctor about how to care for yourself after you go home. Medicines   Take over-the-counter and prescription medicines only as told by your doctor.  If you were prescribed an antibiotic medicine, take it as told by your doctor. Do not stop taking it even if you start to feel better. General instructions  Follow instructions from your doctor about what to eat or drink. Do not eat or drink anything that makes you sick again.  Do not lift anything that is heavier than 10 lb (4.5 kg) until your doctor says that it is safe.  Do not use  any products that contain nicotine or tobacco, such as cigarettes and e-cigarettes. If you need help quitting, ask your doctor.  Keep all follow-up visits as told by your doctor. This is important. Contact a doctor if:  You have pain and your medicine does not help.  You have a fever. Get help right away if:  Your pain moves to: ? Another part of your belly. ? Your back.  Your symptoms do not go away.  You have new symptoms. Summary  Cholecystitis is swelling and irritation of the gallbladder.  This condition may be caused by stones or lumps that form in the gallbladder (gallstones).  Common symptoms are pain in the belly. You may feel sick to your stomach and start throwing up. You may also have a fever and chills.  This condition may be treated with surgery to take out the gallbladder. It may also be treated with medicines, fasting, and fluids through an IV tube.  Follow what you are told about eating and drinking. Do not eat things that make you sick again. This information is not intended to replace advice given to you by your health care provider. Make sure you discuss any questions you have with your health care provider. Document Revised: 05/04/2017 Document Reviewed: 05/04/2017 Elsevier Patient Education  [place].

## 2019-12-19 NOTE — Progress Notes (Signed)
12/19/2019  Reason for Visit:  Acute cholecystitis  Referring Provider:  Delsa Grana, PA-C  History of Present Illness: Elizabeth Russo is a 59 y.o. female presenting for evaluation of acute cholecystitis.  The patient saw her PCP on 11/24 due to abdominal pain in RUQ and epigastric areas.  She had been having pain for about 7 days at that point.  The patient reports it was associated with nausea but no emesis.  She also reports having low grade fevers leading up to her visit.  She had labwork and U/S done as outpatient.  Her laboratory workup was overall unremarkable with a normal WBC of 9.1 and normal LFTs.  However, her U/S showed cholelithiasis, with gallbladder wall thickening of 4 mm and pericholecystic fluid.  By the time of the U/S, the patient reported that her pain was already improving, and so conservative management was followed and she was given a prescription for Augmentin for her cholecystitis.  She was given strict precautions to return to the ER and also recommended a low fat diet.  Today, the patient reports that she's pain free and has not had issues since about 2 days after her PCP's visit.  She is eating well and denies any nausea or vomiting.  She is following a low fat diet.  Has not had fevers and she also completed the antibiotic course.   Of note, she does have a history of NSTEMI in 2013 and has coronary stent in place.  Also has hx of DM which appears well controlled with A1c of 6.1 on 10/10/19.  Past Medical History: Past Medical History:  Diagnosis Date  . Atrial fibrillation ([place]) 02/13/2011  . Breast mass, right 04/25/2014  . CAD (coronary artery disease)   . Diabetes mellitus without complication ([place])   . Diverticulosis   . Family history of colon cancer in father   . Goiter 03/19/2014  . Hearing loss 08/25/2011  . Hypertension   . Hypothyroid   . Localized enlarged lymph nodes 10/23/2014   cervical   . Low back pain 12/25/2014  . Morbid obesity ([place])  06/26/2014  . Myalgia due to statin 04/13/2017   Intolerant to three different statins  . Personal history of colonic polyps   . Vertigo      Past Surgical History: Past Surgical History:  Procedure Laterality Date  . ABDOMINAL HYSTERECTOMY    . COLONOSCOPY WITH PROPOFOL N/A 08/13/2017   Procedure: COLONOSCOPY WITH PROPOFOL;  Surgeon: Lin Landsman, MD;  Location: [facility] ENDOSCOPY;  Service: Gastroenterology;  Laterality: N/A;  . heart stent  2012    Home Medications: Prior to Admission medications   Medication Sig Start Date End Date Taking? Authorizing Provider  Alirocumab (PRALUENT) 75 MG/ML SOAJ Inject 75 mg into the skin every 14 (fourteen) days. 11/27/19  Yes Agbor-Etang, Aaron Edelman, MD  amLODipine-valsartan (EXFORGE) 10-160 MG tablet Take 1 tablet by mouth daily. 10/14/19  Yes Delsa Grana, PA-C  aspirin EC 81 MG tablet Take 81 mg by mouth daily.    Yes [provider]  carvedilol (COREG) 25 MG tablet Take 0.5 tablets (12.5 mg total) by mouth 2 (two) times daily with a meal. 06/19/19  Yes Delsa Grana, PA-C  diclofenac sodium (VOLTAREN) 1 % GEL Apply 2 g topically as needed.  06/09/15  Yes [provider]  ezetimibe (ZETIA) 10 MG tablet Take 1 tablet (10 mg total) by mouth daily. 07/02/19  Yes Delsa Grana, PA-C  famotidine (PEPCID) 20 MG tablet Take 1 tablet (20 mg  total) by mouth 2 (two) times daily. 02/11/19  Yes Delsa Grana, PA-C  fluconazole (DIFLUCAN) 150 MG tablet Take 1 tablet (150 mg total) by mouth every 3 (three) days as needed (for vaginal itching/yeast infection sx). 12/12/19  Yes Delsa Grana, PA-C  fluticasone (FLONASE) 50 MCG/ACT nasal spray Place 2 sprays into both nostrils daily. Patient taking differently: Place 2 sprays into both nostrils as needed. 09/20/17  Yes Poulose, Bethel Born, NP  furosemide (LASIX) 20 MG tablet Take when needed for Edema. 11/27/19  Yes Agbor-Etang, Aaron Edelman, MD  glucose blood test strip Use as instructed 09/12/18  Yes Poulose,  Bethel Born, NP  HYDROcodone-acetaminophen (NORCO/VICODIN) 5-325 MG tablet Take 0.5-1 tablets by mouth 3 (three) times daily as needed for severe pain. 12/03/19  Yes Delsa Grana, PA-C  Insulin Pen Needle (BD PEN NEEDLE NANO U/F) 32G X 4 MM MISC Use for insulin administration four times daily. 01/27/15  Yes Arnetha Courser, MD  levothyroxine (SYNTHROID) 137 MCG tablet Take 1 tablet by mouth daily before breakfast on Tuesday, Thursday, Saturday and Sunday 04/03/19  Yes Delsa Grana, PA-C  levothyroxine (SYNTHROID) 150 MCG tablet Take 1 tablet by mouth once daily on Monday, Wednesday and Friday and take 137 mcg on the other days. 07/29/19  Yes Delsa Grana, PA-C  meclizine (ANTIVERT) 25 MG tablet Take 1 tablet (25 mg total) by mouth Three (3) times a day as needed for dizziness. 10/24/19  Yes Delsa Grana, PA-C  metFORMIN (GLUCOPHAGE-XR) 500 MG 24 hr tablet Take 1 tablet (500 mg total) by mouth daily with breakfast. 07/29/19  Yes Delsa Grana, PA-C  metoCLOPramide (REGLAN) 10 MG tablet Take 1 tablet (10 mg total) by mouth 3 (three) times daily as needed for nausea (headache / nausea). 12/03/19  Yes Delsa Grana, PA-C  ondansetron (ZOFRAN ODT) 4 MG disintegrating tablet Take 1-2 tablets (4-8 mg total) by mouth every 8 (eight) hours as needed for nausea or vomiting. 12/03/19  Yes Delsa Grana, PA-C  pantoprazole (PROTONIX) 40 MG tablet Take 1 tablet (40 mg total) by mouth daily. 12/03/19  Yes Delsa Grana, PA-C  VICTOZA 18 MG/3ML SOPN Inject 1.2 mg into the skin daily. 12/08/19  Yes Delsa Grana, PA-C    Allergies: Allergies  Allergen Reactions  . Atorvastatin Other (See Comments)    Other reaction(s): Muscle Pain  . Hydralazine Other (See Comments)    Aggravated gout Aggravated gout  . Hydrochlorothiazide Other (See Comments)    Aggravated gout  . Lisinopril Cough  . Pravastatin Other (See Comments)    Other reaction(s): Muscle Pain  . Rosuvastatin Other (See Comments)    Other reaction(s):  Muscle Pain    Social History:  reports that she quit smoking about 6 years ago. Her smoking use included cigarettes. She started smoking about 41 years ago. She has a 15.00 pack-year smoking history. She has never used smokeless tobacco. She reports current alcohol use. She reports that she does not use drugs.   Family History: Family History  Problem Relation Age of Onset  . Heart disease Mother   . Diabetes Mother   . Hypertension Mother   . Hyperlipidemia Mother   . Thyroid disease Mother   . Cancer Father        colon  . Diabetes Father   . Hypertension Father   . Diabetes Sister   . Hypertension Sister   . Heart disease Sister   . Hyperlipidemia Sister   . Kidney disease Sister   . Cancer Maternal Grandfather  lung  . Heart attack Paternal Grandfather   . Heart disease Sister   . Diabetes Sister   . Heart disease Sister   . Arthritis Sister     Review of Systems: Review of Systems  Constitutional: Positive for fever. Negative for chills.  HENT: Negative for hearing loss.   Respiratory: Negative for shortness of breath.   Cardiovascular: Negative for chest pain.  Gastrointestinal: Positive for abdominal pain and nausea. Negative for constipation, diarrhea and vomiting.  Genitourinary: Negative for dysuria.  Musculoskeletal: Negative for myalgias.  Skin: Negative for rash.  Neurological: Negative for dizziness.  Psychiatric/Behavioral: Negative for depression.    Physical Exam BP 138/80   Pulse 60   Temp 98.7 F (37.1 C) (Oral)   Ht 5\' 4"  (1.626 m)   Wt 254 lb 6.4 oz (115.4 kg)   LMP  (LMP Unknown)   SpO2 98%   BMI 43.67 kg/m  CONSTITUTIONAL: No acute distress HEENT:  Normocephalic, atraumatic, extraocular motion intact. NECK: Trachea is midline, and there is no jugular venous distension.  RESPIRATORY:  Normal respiratory effort without pathologic use of accessory muscles. CARDIOVASCULAR:  Regular rhythm and rate. GI: The abdomen is soft,  non-distended, with some residual soreness in epigastric and RUQ areas.  Negative Murphy's sign.  No peritonitis. Previous laparoscopic hysterectomy scars are well healed. MUSCULOSKELETAL:  Normal muscle strength and tone in all four extremities.  No peripheral edema or cyanosis. SKIN: Skin turgor is normal. There are no pathologic skin lesions.  NEUROLOGIC:  Motor and sensation is grossly normal.  Cranial nerves are grossly intact. PSYCH:  Alert and oriented to person, place and time. Affect is normal.  Laboratory Analysis: Labs from 12/03/19: Na 136, K 3.6, Cl 101, CO2 28, BUN 11, Cr 0.87.  Total bili 0.5, AST 16, ALT 16, AlkPhos 63.  WBC 9.1, Hgb 12.7, Hct 38.5, Plt 265  Imaging: U/S 12/03/19: IMPRESSION: 1. Cholelithiasis along with gallbladder wall thickening and pericholecystic fluid suggesting acute cholecystitis. Nuclear medicine hepatobiliary scan may be helpful for further evaluation. 2. Fatty infiltration of the liver. 3. No intra or extrahepatic biliary dilatation.  Assessment and Plan: This is a 59 y.o. female with recent history of acute cholecystitis, treated conservatively.  --Discussed with the patient the role of the gallbladder and how she got acute cholecystitis.  Her symptoms have subsided.  Discussed with her the potential options for management including conservative measures with low fat diet and surgical measures with cholecystectomy.  She is interested in surgery and does not want this episode to happen again.  Discussed with her the role for minimally invasive surgery, particularly robotic cholecystectomy.  Reviewed the surgery at length, including risks of bleeding, infection, and injury to surrounding structures.  Discussed also the use of ICG to better visualize the biliary anatomy intraoperatively.  Reviewed post-op instructions and activity restrictions.  She's willing to proceed.  We'll schedule her for mid January to allow for any residual inflammation from this  episode to subside.  In the meantime, continue with low fat diet.  Return precautions given.  Will also obtain medical and cardiology clearance for surgery.  Patient aware that she would have to stop her Aspirin for 5-7 days prior to surgery.  Face-to-face time spent with the patient and care providers was 60 minutes, with more than 50% of the time spent counseling, educating, and coordinating care of the patient.     Melvyn Neth, [facility]

## 2019-12-19 NOTE — Telephone Encounter (Signed)
Hi Dr. Garen Lah,  Elizabeth Russo has an upcoming lap chole planned. She is on Aspirin which I suspect the surgeon will want to hold (not specifically mentioned on clearance form). She has a history of CAD with NSTEMI in 2013 s/p DES to OM2 branch (also found to have occluded mid RCA at that time). She was recently seen by you on 11/27/2019 at which time she was doing well from a cardiac standpoint. She was advised to follow-up in 1 year. Can you please comment on if Aspirin can be held for upcoming procedure and if so for how long?  Please route response back to P CV DIV PREOP.  Thank you! [name]

## 2019-12-22 ENCOUNTER — Telehealth: Payer: Self-pay

## 2019-12-22 ENCOUNTER — Telehealth: Payer: Self-pay | Admitting: Surgery

## 2019-12-22 NOTE — Telephone Encounter (Signed)
   Primary Cardiologist: Kate Sable, MD  Chart reviewed as part of pre-operative protocol coverage. Patient was contacted 12/22/2019 in reference to pre-operative risk assessment for pending surgery as outlined below.  Elizabeth Russo was last seen on 11/27/19 by Dr. Garen Lah.  Since that day, RONDIA HIGGINBOTHAM has done well.  She can complete more than 4.0 METS without angina.  Per Dr. Garen Lah: Aspirin can be held for upcoming procedure. Restart as soon as safely possible after.   Therefore, based on ACC/AHA guidelines, the patient would be at acceptable risk for the planned procedure without further cardiovascular testing.   The patient was advised that if she develops new symptoms prior to surgery to contact our office to arrange for a follow-up visit, and she verbalized understanding.  I will route this recommendation to the requesting party via Epic fax function and remove from pre-op pool. Please call with questions.  Tami Lin Emelie Newsom, PA 12/22/2019, 2:38 PM

## 2019-12-22 NOTE — Telephone Encounter (Signed)
Cardiac Clearance obtained from Dr.Agbor-Etang -patient would be at acceptable risk for the planned procedure without further cardiovascular testing.  Aspirin can be held for the upcoming procedure Restart as soon as possible after the procedure.

## 2019-12-22 NOTE — Telephone Encounter (Signed)
Patient has been advised of Pre-Admission date/time, COVID Testing date and Surgery date.  Surgery Date: 01/22/20 Preadmission Testing Date: 01/14/20 (phone 8a-1) Covid Testing Date: 01/20/20 - patient advised to go to the [facility] between 8a-1p   Patient has been made aware to call [phone], between 1-3:00pm the day before surgery, to find out what time to arrive for surgery.

## 2019-12-24 ENCOUNTER — Telehealth: Payer: Self-pay

## 2019-12-24 ENCOUNTER — Other Ambulatory Visit: Payer: Self-pay | Admitting: Family Medicine

## 2019-12-24 DIAGNOSIS — I10 Essential (primary) hypertension: Secondary | ICD-10-CM

## 2019-12-24 DIAGNOSIS — I480 Paroxysmal atrial fibrillation: Secondary | ICD-10-CM

## 2019-12-24 NOTE — Telephone Encounter (Signed)
Medical Clearance has been faxed to Delsa Grana, Uncertain at [phone].

## 2019-12-31 ENCOUNTER — Telehealth: Payer: Self-pay

## 2019-12-31 NOTE — Telephone Encounter (Signed)
Walked across the hall to [employer] office to regard about the medical clearance faxed on 12/15. Pt has an appt with Leisa on 01/08/2020 and the medical will be done at that time. Pt is scheduled for surgery on 01/22/2020.

## 2020-01-08 ENCOUNTER — Telehealth: Payer: Self-pay

## 2020-01-08 ENCOUNTER — Ambulatory Visit: Payer: BC Managed Care – PPO | Admitting: Family Medicine

## 2020-01-08 ENCOUNTER — Encounter: Payer: Self-pay | Admitting: Family Medicine

## 2020-01-08 ENCOUNTER — Other Ambulatory Visit: Payer: Self-pay

## 2020-01-08 VITALS — BP 124/72 | HR 66 | Temp 98.1°F | Resp 18 | Ht 64.0 in | Wt 255.1 lb

## 2020-01-08 DIAGNOSIS — I2510 Atherosclerotic heart disease of native coronary artery without angina pectoris: Secondary | ICD-10-CM

## 2020-01-08 DIAGNOSIS — E1169 Type 2 diabetes mellitus with other specified complication: Secondary | ICD-10-CM

## 2020-01-08 DIAGNOSIS — I5030 Unspecified diastolic (congestive) heart failure: Secondary | ICD-10-CM

## 2020-01-08 DIAGNOSIS — Z01818 Encounter for other preprocedural examination: Secondary | ICD-10-CM | POA: Diagnosis not present

## 2020-01-08 DIAGNOSIS — I252 Old myocardial infarction: Secondary | ICD-10-CM

## 2020-01-08 DIAGNOSIS — E661 Drug-induced obesity: Secondary | ICD-10-CM

## 2020-01-08 DIAGNOSIS — I10 Essential (primary) hypertension: Secondary | ICD-10-CM | POA: Diagnosis not present

## 2020-01-08 DIAGNOSIS — I471 Supraventricular tachycardia: Secondary | ICD-10-CM

## 2020-01-08 DIAGNOSIS — K8000 Calculus of gallbladder with acute cholecystitis without obstruction: Secondary | ICD-10-CM

## 2020-01-08 DIAGNOSIS — E669 Obesity, unspecified: Secondary | ICD-10-CM

## 2020-01-08 DIAGNOSIS — R0981 Nasal congestion: Secondary | ICD-10-CM

## 2020-01-08 DIAGNOSIS — Z760 Encounter for issue of repeat prescription: Secondary | ICD-10-CM

## 2020-01-08 DIAGNOSIS — H6982 Other specified disorders of Eustachian tube, left ear: Secondary | ICD-10-CM

## 2020-01-08 DIAGNOSIS — Z6841 Body Mass Index (BMI) 40.0 and over, adult: Secondary | ICD-10-CM

## 2020-01-08 LAB — POCT GLYCOSYLATED HEMOGLOBIN (HGB A1C): Hemoglobin A1C: 6 % — AB (ref 4.0–5.6)

## 2020-01-08 MED ORDER — FLUTICASONE PROPIONATE 50 MCG/ACT NA SUSP: 2.0000 | Freq: Every day | NASAL | 2 refills | Status: DC

## 2020-01-08 NOTE — Telephone Encounter (Signed)
Medical Clearance received from [facility] -optimized for surgery from a medical standpoint HgB A1C 6.1 resulted today-

## 2020-01-08 NOTE — Progress Notes (Signed)
Patient ID: ANAMTA ALLEGRA, female    DOB: 03-27-1960, 59 y.o.   MRN: [id number]  PCP: Delsa Grana, PA-C  Chief Complaint  Patient presents with  . Procedure    Surgical Clearance for gallbladder removal on 01/22/2020 [facility]    Subjective:   DESAREE PETRY is a 59 y.o. female, presents to clinic with CC of the following:  HPI  Preop clearance for lap chole Fairly significant cardiac hx - HFpEF, last EKG reviewed 11/27/2019, bradycardia, est with cardiology: She was recently seen by cardiology for routine f/up in Nov, reviewed chart and Dr. Garen Lah and Cardiology PA did do clearance for her: Primary Cardiologist: Kate Sable, MD  Chart reviewed as part of pre-operative protocol coverage. Patient was contacted 12/22/2019 in reference to pre-operative risk assessment for pending surgery as outlined below.  DEBRA GUIA was last seen on 11/27/19 by Dr. Garen Lah.  Since that day, SARESA CAPUCHINO has done well.  She can complete more than 4.0 METS without angina.  Per Dr. Garen Lah: Aspirin can be held for upcoming procedure. Restart as soon as safely possible after.   Therefore, based on ACC/AHA guidelines, the patient would be at acceptable risk for the planned procedure without further cardiovascular testing.   The patient was advised that if she develops new symptoms prior to surgery to contact our office to arrange for a follow-up visit, and she verbalized understanding.  I will route this recommendation to the requesting party via Epic fax function and remove from pre-op pool. Please call with questions.  Ledora Bottcher, PA 12/22/2019, 2:38 PM   Pt scheduled for lap chole: Low intrinsic cardiac risk Other risk factors other than cardiac - morbid obesity, T2DM DM was well controlled as of last routine OV to check  She notes blood sugars have been well controlled, but not checking very often her battery died on meter - will get A1C  today (only a few days early before 3 mont recheck) on victoza and metformin Hx of DM - well controlled Lab Results  Component Value Date   HGBA1C 6.1 (H) 10/10/2019   Renal function recent labs: Lab Results  Component Value Date   GFRAA 85 12/03/2019   GFRAA 88 10/10/2019   GFRAA 80 03/31/2019   Lab Results  Component Value Date   CREATININE 0.87 12/03/2019   BUN 11 12/03/2019   NA 136 12/03/2019   K 3.6 12/03/2019   CL 101 12/03/2019   CO2 28 12/03/2019   No hx of anemia Hemoglobin  Date Value Ref Range Status  12/03/2019 12.7 11.7 - 15.5 g/dL Final  10/10/2019 14.1 11.7 - 15.5 g/dL Final  03/31/2019 13.8 11.7 - 15.5 g/dL Final  01/30/2019 13.8 11.1 - 15.9 g/dL Final  11/08/2018 13.3 11.7 - 15.5 g/dL Final  12/25/2014 14.3 11.1 - 15.9 g/dL Final  10/23/2014 13.7 11.1 - 15.9 g/dL Final  08/19/2014 13.7 11.1 - 15.9 g/dL Final   Good functional status - and previously cleared by cardiology Hx of PAfib, HR RRR today, she has intermittent palpitations      Patient Active Problem List   Diagnosis Date Noted  . Statin intolerance 03/31/2019  . Heart failure with preserved left ventricular function (HFpEF) ([place]) 03/31/2019  . Atrial paroxysmal tachycardia ([place]) 03/31/2019  . 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 Urinary incontinence 03/31/2019  . Myalgia due to statin 04/13/2017  .  Venous stasis 04/13/2017  . History of non-ST elevation myocardial infarction (NSTEMI) 02/05/2015  . Hx of percutaneous transluminal coronary angioplasty 02/05/2015  . Gout of knee 12/25/2014  . Coronary atherosclerosis 12/19/2013  . Hyperlipidemia 12/19/2013  . Essential hypertension 12/18/2013  . Diabetes mellitus type 2 in obese (HCC) 12/18/2013  . Hypothyroidism 12/18/2013      Current Outpatient Medications:  .  Alirocumab (PRALUENT) 75 MG/ML SOAJ, Inject 75 mg into the skin every 14  (fourteen) days., Disp: 2 mL, Rfl: 3 .  amLODipine-valsartan (EXFORGE) 10-160 MG tablet, Take 1 tablet by mouth daily., Disp: 90 tablet, Rfl: 3 .  aspirin EC 81 MG tablet, Take 81 mg by mouth daily. , Disp: , Rfl:  .  carvedilol (COREG) 25 MG tablet, Take 0.5 tablets (12.5 mg total) by mouth 2 (two) times a day with meals. (Patient taking differently: Take 12.5 mg by mouth 2 (two) times daily with a meal.), Disp: 90 tablet, Rfl: 1 .  diclofenac sodium (VOLTAREN) 1 % GEL, Apply 2 g topically daily as needed (pain)., Disp: , Rfl:  .  ezetimibe (ZETIA) 10 MG tablet, Take 1 tablet (10 mg total) by mouth daily. (Patient taking differently: Take 10 mg by mouth daily.), Disp: 30 tablet, Rfl: 11 .  famotidine (PEPCID) 20 MG tablet, Take 1 tablet (20 mg total) by mouth 2 (two) times daily., Disp: 180 tablet, Rfl: 1 .  furosemide (LASIX) 20 MG tablet, Take when needed for Edema. (Patient taking differently: Take 20 mg by mouth daily as needed for edema. Take when needed for Edema.), Disp: 90 tablet, Rfl: 3 .  HYDROcodone-acetaminophen (NORCO/VICODIN) 5-325 MG tablet, Take 0.5-1 tablets by mouth 3 (three) times daily as needed for severe pain., Disp: 12 tablet, Rfl: 0 .  levothyroxine (SYNTHROID) 137 MCG tablet, Take 1 tablet by mouth daily before breakfast on Tuesday, Thursday, Saturday and Sunday (Patient taking differently: Take 137 mcg by mouth See admin instructions. Take 1 tablet by mouth daily before breakfast on Tuesday, Thursday, Saturday and Sunday), Disp: 17 tablet, Rfl: 11 .  levothyroxine (SYNTHROID) 150 MCG tablet, Take 1 tablet by mouth once daily on Monday, Wednesday and Friday and take 137 mcg on the other days. (Patient taking differently: Take 150 mcg by mouth every Monday, Wednesday, and Friday.), Disp: 78 tablet, Rfl: 1 .  meclizine (ANTIVERT) 25 MG tablet, Take 1 tablet (25 mg total) by mouth Three (3) times a day as needed for dizziness. (Patient taking differently: Take 25 mg by mouth 3  (three) times daily as needed for dizziness.), Disp: 30 tablet, Rfl: 1 .  metFORMIN (GLUCOPHAGE-XR) 500 MG 24 hr tablet, Take 1 tablet (500 mg total) by mouth daily with breakfast. (Patient taking differently: Take 500 mg by mouth daily with breakfast. Take 1 tablet (500 mg total) by mouth daily with breakfast.), Disp: 90 tablet, Rfl: 1 .  metoCLOPramide (REGLAN) 10 MG tablet, Take 1 tablet (10 mg total) by mouth 3 (three) times daily as needed for nausea (headache / nausea)., Disp: 20 tablet, Rfl: 1 .  ondansetron (ZOFRAN ODT) 4 MG disintegrating tablet, Take 1-2 tablets (4-8 mg total) by mouth every 8 (eight) hours as needed for nausea or vomiting., Disp: 20 tablet, Rfl: 1 .  pantoprazole (PROTONIX) 40 MG tablet, Take 1 tablet (40 mg total) by mouth daily., Disp: 60 tablet, Rfl: 1 .  VICTOZA 18 MG/3ML SOPN, Inject 1.2 mg into the skin daily. (Patient taking differently: Inject 1.2 mg into the skin at bedtime.), Disp: 6  mL, Rfl: 2 .  fluticasone (FLONASE) 50 MCG/ACT nasal spray, Place 2 sprays into both nostrils daily. (Patient not taking: Reported on 01/08/2020), Disp: 16 g, Rfl: 2 .  glucose blood test strip, Use as instructed (Patient not taking: Reported on 01/08/2020), Disp: 100 each, Rfl: 12 .  Insulin Pen Needle (BD PEN NEEDLE NANO U/F) 32G X 4 MM MISC, Use for insulin administration four times daily. (Patient not taking: Reported on 01/08/2020), Disp: 200 each, Rfl: 1   Allergies  Allergen Reactions  . Atorvastatin Other (See Comments)    Other reaction(s): Muscle Pain  . Hydralazine Other (See Comments)    Aggravated gout Aggravated gout  . Hydrochlorothiazide Other (See Comments)    Aggravated gout  . Lisinopril Cough  . Pravastatin Other (See Comments)    Other reaction(s): Muscle Pain  . Rosuvastatin Other (See Comments)    Other reaction(s): Muscle Pain     Social History   Tobacco Use  . Smoking status: Former Smoker    Packs/day: 0.50    Years: 30.00    Pack years:  15.00    Types: Cigarettes    Start date: 01/09/1978    Quit date: 03/09/2013    Years since quitting: 6.8  . Smokeless tobacco: Never Used  Vaping Use  . Vaping Use: Never used  Substance Use Topics  . Alcohol use: Yes    Alcohol/week: 0.0 standard drinks    Comment: Socially   . Drug use: No      Chart Review Today: I personally reviewed active problem list, medication list, allergies, family history, social history, health maintenance, notes from last encounter, lab results, imaging with the patient/caregiver today. Personally reviewed labs from October, November this year, cardiology OV and most recent ECG and other cardiac testing - spend more than 10 min today with chart review  Review of Systems Al other Systems reviewed and are negative for acute change except as noted in the HPI.     Objective:   Vitals:   01/08/20 1312  BP: 124/72  Pulse: 66  Resp: 18  Temp: 98.1 F (36.7 C)  TempSrc: Oral  SpO2: 97%  Weight: 255 lb 1.6 oz (115.7 kg)  Height: 5\' 4"  (1.626 m)    Body mass index is 43.79 kg/m.  Physical Exam Vitals and nursing note reviewed.  Constitutional:      General: She is not in acute distress.    Appearance: Normal appearance. She is well-developed. She is obese. She is not ill-appearing, toxic-appearing or diaphoretic.     Interventions: Face mask in place.  HENT:     Head: Normocephalic and atraumatic.     Right Ear: External ear normal.     Left Ear: External ear normal.     Nose: Congestion present.     Mouth/Throat:     Mouth: Mucous membranes are moist.     Pharynx: Oropharynx is clear. No oropharyngeal exudate or posterior oropharyngeal erythema.  Eyes:     General: Lids are normal. No scleral icterus.       Right eye: No discharge.        Left eye: No discharge.     Conjunctiva/sclera: Conjunctivae normal.     Pupils: Pupils are equal, round, and reactive to light.  Neck:     Trachea: Phonation normal. No tracheal deviation.   Cardiovascular:     Rate and Rhythm: Normal rate and regular rhythm.     Pulses: Normal pulses.  Radial pulses are 2+ on the right side and 2+ on the left side.       Posterior tibial pulses are 2+ on the right side and 2+ on the left side.     Heart sounds: Normal heart sounds. No murmur heard. No friction rub. No gallop.   Pulmonary:     Effort: Pulmonary effort is normal. No respiratory distress.     Breath sounds: Normal breath sounds. No stridor. No wheezing, rhonchi or rales.  Chest:     Chest wall: No tenderness.  Abdominal:     General: Bowel sounds are normal. There is no distension.     Palpations: Abdomen is soft.     Tenderness: There is no abdominal tenderness. There is no guarding or rebound.  Musculoskeletal:     Right lower leg: No edema.     Left lower leg: No edema.  Skin:    General: Skin is warm and dry.     Capillary Refill: Capillary refill takes less than 2 seconds.     Coloration: Skin is not jaundiced or pale.     Findings: No rash.  Neurological:     Mental Status: She is alert. Mental status is at baseline.     Motor: No abnormal muscle tone.     Gait: Gait normal.  Psychiatric:        Mood and Affect: Mood normal.        Speech: Speech normal.        Behavior: Behavior normal.      Results for orders placed or performed in visit on 12/03/19  COMPLETE METABOLIC PANEL WITH GFR  Result Value Ref Range   Glucose, Bld 100 (H) 65 - 99 mg/dL   BUN 11 7 - 25 mg/dL   Creat 0.87 0.50 - 1.05 mg/dL   GFR, Est Non African American 73 > OR = 60 mL/min/1.26m2   GFR, Est African American 85 > OR = 60 mL/min/1.27m2   BUN/Creatinine Ratio NOT APPLICABLE 6 - 22 (calc)   Sodium 136 135 - 146 mmol/L   Potassium 3.6 3.5 - 5.3 mmol/L   Chloride 101 98 - 110 mmol/L   CO2 28 20 - 32 mmol/L   Calcium 8.7 8.6 - 10.4 mg/dL   Total Protein 6.8 6.1 - 8.1 g/dL   Albumin 3.4 (L) 3.6 - 5.1 g/dL   Globulin 3.4 1.9 - 3.7 g/dL (calc)   AG Ratio 1.0 1.0 - 2.5  (calc)   Total Bilirubin 0.5 0.2 - 1.2 mg/dL   Alkaline phosphatase (APISO) 63 37 - 153 U/L   AST 16 10 - 35 U/L   ALT 16 6 - 29 U/L  CBC with Differential/Platelet  Result Value Ref Range   WBC 9.1 3.8 - 10.8 Thousand/uL   RBC 4.38 3.80 - 5.10 Million/uL   Hemoglobin 12.7 11.7 - 15.5 g/dL   HCT 38.5 35.0 - 45.0 %   MCV 87.9 80.0 - 100.0 fL   MCH 29.0 27.0 - 33.0 pg   MCHC 33.0 32.0 - 36.0 g/dL   RDW 12.6 11.0 - 15.0 %   Platelets 265 140 - 400 Thousand/uL   MPV 9.1 7.5 - 12.5 fL   Neutro Abs 5,269 1,500 - 7,800 cells/uL   Lymphs Abs 2,521 850 - 3,900 cells/uL   Absolute Monocytes 1,119 (H) 200 - 950 cells/uL   Eosinophils Absolute 146 15 - 500 cells/uL   Basophils Absolute 46 0 - 200 cells/uL   Neutrophils Relative % 57.9 %  Total Lymphocyte 27.7 %   Monocytes Relative 12.3 %   Eosinophils Relative 1.6 %   Basophils Relative 0.5 %  POCT urinalysis dipstick  Result Value Ref Range   Color, UA gold    Clarity, UA cloudy    Glucose, UA Negative Negative   Bilirubin, UA negative    Ketones, UA negative    Spec Grav, UA 1.015 1.010 - 1.025   Blood, UA large    pH, UA 6.0 5.0 - 8.0   Protein, UA Positive (A) Negative   Urobilinogen, UA 0.2 0.2 or 1.0 E.U./dL   Nitrite, UA negative    Leukocytes, UA Small (1+) (A) Negative   Appearance murky    Odor strong        Assessment & Plan:   1. Preoperative examination Done today with PE, review of recent labs, cardiac testing, recent OV and documentation/letter Deemed acceptable risk - BMI >40  2. Acute cholecystitis due to biliary calculus Lap chole 1/13 with Dr. Hampton Abbot Only some mild intermittent sx over the holidays  3. Essential hypertension Stable, well controlled  4. Diabetes mellitus type 2 in obese (HCC) Stable, well controlled and at goal Hold metformin day of surgery - POCT glycosylated hemoglobin (Hb A1C)  5. Atherosclerosis of native coronary artery of native heart without angina pectoris See below  for #5-9  6. History of non-ST elevation myocardial infarction (NSTEMI)   7. Heart failure with preserved left ventricular function (HFpEF) ([place])   8. Atrial paroxysmal tachycardia (HCC)   9. 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 Medication refill  - fluticasone (FLONASE) 50 MCG/ACT nasal spray; Place 2 sprays into both nostrils daily.  Dispense: 16 g; Refill: 2  11. Eustachian tube dysfunction, left  - fluticasone (FLONASE) 50 MCG/ACT nasal spray; Place 2 sprays into both nostrils daily.  Dispense: 16 g; Refill: 2  12. Nasal congestion  - fluticasone (FLONASE) 50 MCG/ACT nasal spray; Place 2 sprays into both nostrils daily.  Dispense: 16 g; Refill: 2   Pt deemed acceptable risk for surgery today in addition to cardiology clearance for her noted Dx above #5-9 Dm well controlled, good renal function, no anemia     Delsa Grana, PA-C 01/08/20 1:30 PM

## 2020-01-14 ENCOUNTER — Other Ambulatory Visit: Payer: Self-pay

## 2020-01-14 ENCOUNTER — Other Ambulatory Visit
Admission: RE | Admit: 2020-01-14 | Discharge: 2020-01-14 | Disposition: A | Discharge status: Home / Self Care | Payer: BC Managed Care – PPO | Source: Ambulatory Visit | Attending: Surgery | Admitting: Surgery

## 2020-01-14 NOTE — Patient Instructions (Signed)
Your procedure is scheduled on: Thursday 01/22/20.  Report to THE FIRST FLOOR REGISTRATION DESK IN THE MEDICAL MALL ON THE MORNING OF SURGERY FIRST, THEN YOU WILL CHECK IN AT THE SURGERY INFORMATION DESK LOCATED OUTSIDE THE SAME DAY SURGERY DEPARTMENT LOCATED ON 2ND FLOOR MEDICAL MALL ENTRANCE.  To find out your arrival time please call [phone] between 1PM - 3PM on Wednesday 01/21/20.   Remember: Instructions that are not followed completely may result in serious medical risk, up to and including death, or upon the discretion of your surgeon and anesthesiologist your surgery may need to be rescheduled.     __X__ 1. Do not eat food after midnight the night before your procedure.                 No gum chewing or hard candies. You may drink SUGAR FREE clear liquids up to 2 hours                 before you are scheduled to arrive for your surgery- DO NOT drink clear                 liquids within 2 hours of the start of your surgery.                         __X__2.  On the morning of surgery brush your teeth with toothpaste and water, you may rinse your mouth with mouthwash if you wish.  Do not swallow any toothpaste or mouthwash.    __X__ 3.  No Alcohol for 24 hours before or after surgery.  __X__ 4.  Do Not Smoke or use e-cigarettes For 24 Hours Prior to Your Surgery.                 Do not use any chewable tobacco products for at least 6 hours prior to                 surgery.  __X__5.  Notify your doctor if there is any change in your medical condition      (cold, fever, infections).      Do NOT wear jewelry, make-up, hairpins, clips or nail polish. Do NOT wear lotions, powders, or perfumes.  Do NOT shave 48 hours prior to surgery. Men may shave face and neck. Do NOT bring valuables to the hospital.     [facility] is not responsible for any belongings or valuables.   Contacts, dentures/partials or body piercings may not be worn into surgery. Bring a case for your contacts,  glasses or hearing aids, a denture cup will be supplied.    Patients discharged the day of surgery will not be allowed to drive home.     __X__ Take these medicines the morning of surgery with A SIP OF WATER:     1. carvedilol (COREG)  2. famotidine (PEPCID) OR pantoprazole (PROTONIX)  3. levothyroxine (SYNTHROID)   4. HYDROcodone-acetaminophen (NORCO/VICODIN) if needed     __X__ Use CHG Soap as directed.  __X__ Stop Metformin 2 days prior to surgery. Your last dose will be on Monday 01/19/20.   __X__ Stop Blood Thinners: Aspirin. Your last dose will be on Friday 01/16/20.  __X__ Stop Anti-inflammatories 7 days before surgery such as Advil, Ibuprofen, Motrin, BC or Goodies Powder, Naprosyn, Naproxen, Aleve, Aspirin, Meloxicam. May take Tylenol if needed for pain or discomfort.   __X__Do not start taking any new herbal supplements or vitamins prior to your  procedure.     Wear comfortable clothing (specific to your surgery type) to the hospital.  Plan for stool softeners for home use; pain medications have a tendency to cause constipation. You can also help prevent constipation by eating foods high in fiber such as fruits and vegetables and drinking plenty of fluids as your diet allows.  After surgery, you can prevent lung complications by doing breathing exercises.Take deep breaths and cough every 1-2 hours. Your doctor may order a device called an Incentive Spirometer to help you take deep breaths.  Please call the Pre-Admissions Testing Department at [phone] if you have any questions about these instructions.

## 2020-01-19 ENCOUNTER — Encounter: Payer: Self-pay | Admitting: Surgery

## 2020-01-19 NOTE — Progress Notes (Signed)
[facility] Perioperative Services  Pre-Admission/Anesthesia Testing Clinical Review  Date: 01/20/20  Patient Demographics:  Name: Elizabeth Russo DOB:   1960/03/26 MRN:   [id number]  Planned Surgical Procedure(s):    Case: [id number] Date/Time: 01/22/20 0715   Procedures:      XI ROBOTIC ASSISTED LAPAROSCOPIC CHOLECYSTECTOMY (N/A )     INDOCYANINE GREEN FLUORESCENCE IMAGING (ICG) (N/A )   Anesthesia type: General   Pre-op diagnosis: acute cholecystitis   Location: [facility] OR ROOM 04 / [facility] ORS FOR ANESTHESIA GROUP   Surgeons: Olean Ree, MD    NOTE: Available PAT nursing documentation and vital signs have been reviewed. Clinical nursing staff has updated patient's PMH/PSHx, current medication list, and drug allergies/intolerances to ensure comprehensive history available to assist in medical decision making as it pertains to the aforementioned surgical procedure and anticipated anesthetic course.   Clinical Discussion:  Elizabeth Russo is a 60 y.o. female who is submitted for pre-surgical anesthesia review and clearance prior to her undergoing the above procedure. Patient is a Former Smoker (15 pack years; quit 03/2013). Pertinent PMH includes: CAD, NSTEMI (01/2011), HFpEF, atrial fibrillation, HTN, HLD, T2DM, hypothyroidism, morbid obesity (last documented BMI 43.79 kg/m).  Patient is followed by cardiology Garen Lah, MD). She was last seen in the cardiology clinic on 11/27/2019; notes reviewed.  At the time of her clinic visit, patient doing well overall from a cardiovascular perspective.  She denied any episodes of chest pain or shortness of breath, she reported rare episodes of palpitations in the setting of known atrial tachycardia for which she takes carvedilol.  Patient felt well with no acute complaints.  She denied PND, orthopnea, peripheral edema, vertiginous symptoms, and presyncope/syncope.  Patient with past medical history significant for NSTEMI in  01/2011.  Diagnostic left heart catheterization that revealed three-vessel CAD; LVEF by ventriculogram was 45%.  Patient underwent DES placement x1 for 90% stenosis to the OM 2.  Stress echocardiogram performed on 01/25/2016 revealed normal LA pressures with diastolic dysfunction; trivial valvular insufficiency, and a maximum workload of 10.10 METS during stress. Long-term event monitor study was performed on 11/18/2018 revealing a predominantly sinus rhythm with frequent episodes of SVT despite beta-blocker twice daily.  Last TTE performed on 12/17/2018 revealed normal left ventricular systolic function with an LVEF of 60-65%.  Myocardial perfusion imaging study performed on 02/21/2019 revealed no evidence of significant ischemia; LVEF 60% (see full interpretation of cardiovascular testing and intervention below).  Patient on GDMT for her HTN and HLD diagnoses.  Blood pressure well controlled at 110/70 on current beta-blocker, diuretic, CCB, and ARB therapy.  Patient is intolerant of statin.  HLD managed with ezetimibe and alirocumab.  No significant changes to her overall health history.  No significant changes were made to patient's medication regimen.  Patient to follow-up with outpatient cardiology in 1 year or sooner if needed.  Patient scheduled to undergo laparoscopic cholecystectomy on 01/22/2020 with Dr. Ardath Sax.  Given patient's past medical history significant for cardiovascular issues and multiple comorbidities, presurgical cardiac and internal medicine clearances were sought by the patient's attending surgeons office and PAT team.  Requested clearances issued as follows:   Per internal/family medicine, "patient optimized for surgery from our perspective.  She is received clearance from her cardiologist.  She is deemed to be an overall ACCEPTABLE risk for surgery".   Per cardiology, "patient doing well overall.  She can complete more than 4 METS without angina/anginal equivalent symptoms.   Therefore, based on ACC/AHA guidelines, the  patient would be at an overall ACCEPTABLE risk for the planned procedure without further cardiovascular testing".  This patient is on daily antiplatelet therapy.  She has been instructed on recommendations from cardiology and surgery for holding her daily low-dose ASA for 5 days prior to her procedure and resuming as soon as deemed to be safe to do so postoperatively.  The patient has been instructed that her last dose of her ASA will be on 01/16/2020.  She denies previous perioperative complications with anesthesia. She underwent a general anesthetic course here (ASA III) in 08/2017 with no documented complications.   Vitals with BMI 01/08/2020 12/19/2019 12/03/2019  Height 5' 4"  5' 4"  5' 4"   Weight 255 lbs 2 oz 254 lbs 6 oz 255 lbs 13 oz  BMI 43.77 76.54 65.03  Systolic 546 568 127  Diastolic 72 80 72  Pulse 66 60 86    Providers/Specialists:   NOTE: Primary physician provider listed below. Patient may have been seen by APP or partner within same practice.   PROVIDER ROLE / SPECIALTY LAST Delight Stare, MD General Surgery  12/19/2019  Delsa Grana, PA-C Primary Care Provider  01/08/2020  Claudius Sis, MD Cardiology  11/27/2019   Allergies:  Atorvastatin, Hydralazine, Hydrochlorothiazide, Lisinopril, Pravastatin, and Rosuvastatin  Current Home Medications:   No current facility-administered medications for this encounter.   . Alirocumab (PRALUENT) 75 MG/ML SOAJ  . amLODipine-valsartan (EXFORGE) 10-160 MG tablet  . aspirin EC 81 MG tablet  . carvedilol (COREG) 25 MG tablet  . diclofenac sodium (VOLTAREN) 1 % GEL  . ezetimibe (ZETIA) 10 MG tablet  . famotidine (PEPCID) 20 MG tablet  . furosemide (LASIX) 20 MG tablet  . HYDROcodone-acetaminophen (NORCO/VICODIN) 5-325 MG tablet  . levothyroxine (SYNTHROID) 137 MCG tablet  . levothyroxine (SYNTHROID) 150 MCG tablet  . meclizine (ANTIVERT) 25 MG tablet  . metFORMIN  (GLUCOPHAGE-XR) 500 MG 24 hr tablet  . metoCLOPramide (REGLAN) 10 MG tablet  . ondansetron (ZOFRAN ODT) 4 MG disintegrating tablet  . pantoprazole (PROTONIX) 40 MG tablet  . VICTOZA 18 MG/3ML SOPN  . fluticasone (FLONASE) 50 MCG/ACT nasal spray  . glucose blood test strip  . Insulin Pen Needle (BD PEN NEEDLE NANO U/F) 32G X 4 MM MISC   History:   Past Medical History:  Diagnosis Date  . (HFpEF) heart failure with preserved ejection fraction ([place])   . Atrial fibrillation ([place]) 02/13/2011  . Breast mass, right 04/25/2014  . CAD (coronary artery disease)   . Diabetes mellitus without complication ([place])   . Diverticulosis   . Family history of colon cancer in father   . Goiter 03/19/2014  . Hearing loss 08/25/2011  . HLD (hyperlipidemia)   . Hypertension   . Hypothyroid   . Localized enlarged lymph nodes 10/23/2014   cervical   . Low back pain 12/25/2014  . Morbid obesity ([place]) 06/26/2014  . Myalgia due to statin 04/13/2017   Intolerant to three different statins  . NSTEMI (non-ST elevated myocardial infarction) ([place]) 01/2011   Subendocardial  . Personal history of colonic polyps   . Vertigo    Past Surgical History:  Procedure Laterality Date  . ABDOMINAL HYSTERECTOMY    . CARDIAC CATHETERIZATION    . COLONOSCOPY WITH PROPOFOL N/A 08/13/2017   Procedure: COLONOSCOPY WITH PROPOFOL;  Surgeon: Lin Landsman, MD;  Location: [facility] ENDOSCOPY;  Service: Gastroenterology;  Laterality: N/A;  . heart stent  2012   Family History  Problem Relation Age of Onset  . Heart  disease Mother   . Diabetes Mother   . Hypertension Mother   . Hyperlipidemia Mother   . Thyroid disease Mother   . Cancer Father        colon  . Diabetes Father   . Hypertension Father   . Diabetes Sister   . Hypertension Sister   . Heart disease Sister   . Hyperlipidemia Sister   . Kidney disease Sister   . Cancer Maternal Grandfather        lung  . Heart attack Paternal Grandfather   . Heart disease  Sister   . Diabetes Sister   . Heart disease Sister   . Arthritis Sister    Social History   Tobacco Use  . Smoking status: Former Smoker    Packs/day: 0.50    Years: 30.00    Pack years: 15.00    Types: Cigarettes    Start date: 01/09/1978    Quit date: 03/09/2013    Years since quitting: 6.8  . Smokeless tobacco: Never Used  Vaping Use  . Vaping Use: Never used  Substance Use Topics  . Alcohol use: Yes    Alcohol/week: 0.0 standard drinks    Comment: Socially   . Drug use: No    Pertinent Clinical Results:  LABS: Labs reviewed: Acceptable for surgery.  Hospital Outpatient Visit on 01/20/2020  Component Date Value Ref Range Status  . Sodium 01/20/2020 138  135 - 145 mmol/L Final  . Potassium 01/20/2020 3.8  3.5 - 5.1 mmol/L Final  . Chloride 01/20/2020 103  98 - 111 mmol/L Final  . CO2 01/20/2020 26  22 - 32 mmol/L Final  . Glucose, Bld 01/20/2020 105* 70 - 99 mg/dL Final   Glucose reference range applies only to samples taken after fasting for at least 8 hours.  . BUN 01/20/2020 10  6 - 20 mg/dL Final  . Creatinine, Ser 01/20/2020 0.89  0.44 - 1.00 mg/dL Final  . Calcium 01/20/2020 9.0  8.9 - 10.3 mg/dL Final  . GFR, Estimated 01/20/2020 >60  >60 mL/min Final   Comment: (NOTE) Calculated using the CKD-EPI Creatinine Equation (2021)   . Anion gap 01/20/2020 9  5 - 15 Final   Performed at [facility], [address] [place], [place] [address]  Office Visit on 01/08/2020  Component Date Value Ref Range Status  . Hemoglobin A1C 01/08/2020 6.0* 4.0 - 5.6 % Final  Office Visit on 12/03/2019  Component Date Value Ref Range Status  . Color, UA 12/03/2019 gold   Final  . Clarity, UA 12/03/2019 cloudy   Final  . Glucose, UA 12/03/2019 Negative  Negative Final  . Bilirubin, UA 12/03/2019 negative   Final  . Ketones, UA 12/03/2019 negative   Final  . Spec Grav, UA 12/03/2019 1.015  1.010 - 1.025 Final  . Blood, UA 12/03/2019 large   Final  . pH, UA 12/03/2019  6.0  5.0 - 8.0 Final  . Protein, UA 12/03/2019 Positive* Negative Final  . Urobilinogen, UA 12/03/2019 0.2  0.2 or 1.0 E.U./dL Final  . Nitrite, UA 12/03/2019 negative   Final  . Leukocytes, UA 12/03/2019 Small (1+)* Negative Final  . Appearance 12/03/2019 murky   Final  . Odor 12/03/2019 strong   Final  . Glucose, Bld 12/03/2019 100* 65 - 99 mg/dL Final   Comment: .            Fasting reference interval . For someone without known diabetes, a glucose value between 100 and 125  mg/dL is consistent with prediabetes and should be confirmed with a follow-up test. .   . BUN 12/03/2019 11  7 - 25 mg/dL Final  . Creat 12/03/2019 0.87  0.50 - 1.05 mg/dL Final   Comment: For patients >68 years of age, the reference limit for Creatinine is approximately 13% higher for people identified as African-American. .   . GFR, Est Non African American 12/03/2019 73  > OR = 60 mL/min/1.49m Final  . GFR, Est African American 12/03/2019 85  > OR = 60 mL/min/1.762mFinal  . BUN/Creatinine Ratio 1124/40/1027OT APPLICABLE  6 - 22 (calc) Final  . Sodium 12/03/2019 136  135 - 146 mmol/L Final  . Potassium 12/03/2019 3.6  3.5 - 5.3 mmol/L Final  . Chloride 12/03/2019 101  98 - 110 mmol/L Final  . CO2 12/03/2019 28  20 - 32 mmol/L Final  . Calcium 12/03/2019 8.7  8.6 - 10.4 mg/dL Final  . Total Protein 12/03/2019 6.8  6.1 - 8.1 g/dL Final  . Albumin 12/03/2019 3.4* 3.6 - 5.1 g/dL Final  . Globulin 12/03/2019 3.4  1.9 - 3.7 g/dL (calc) Final  . AG Ratio 12/03/2019 1.0  1.0 - 2.5 (calc) Final  . Total Bilirubin 12/03/2019 0.5  0.2 - 1.2 mg/dL Final  . Alkaline phosphatase (APISO) 12/03/2019 63  37 - 153 U/L Final  . AST 12/03/2019 16  10 - 35 U/L Final  . ALT 12/03/2019 16  6 - 29 U/L Final  . WBC 12/03/2019 9.1  3.8 - 10.8 Thousand/uL Final  . RBC 12/03/2019 4.38  3.80 - 5.10 Million/uL Final  . Hemoglobin 12/03/2019 12.7  11.7 - 15.5 g/dL Final  . HCT 12/03/2019 38.5  35.0 - 45.0 % Final  . MCV  12/03/2019 87.9  80.0 - 100.0 fL Final  . MCH 12/03/2019 29.0  27.0 - 33.0 pg Final  . MCHC 12/03/2019 33.0  32.0 - 36.0 g/dL Final  . RDW 12/03/2019 12.6  11.0 - 15.0 % Final  . Platelets 12/03/2019 265  140 - 400 Thousand/uL Final  . MPV 12/03/2019 9.1  7.5 - 12.5 fL Final  . Neutro Abs 12/03/2019 5,269  1,500 - 7,800 cells/uL Final  . Lymphs Abs 12/03/2019 2,521  850 - 3,900 cells/uL Final  . Absolute Monocytes 12/03/2019 1,119* 200 - 950 cells/uL Final  . Eosinophils Absolute 12/03/2019 146  15 - 500 cells/uL Final  . Basophils Absolute 12/03/2019 46  0 - 200 cells/uL Final  . Neutrophils Relative % 12/03/2019 57.9  % Final  . Total Lymphocyte 12/03/2019 27.7  % Final  . Monocytes Relative 12/03/2019 12.3  % Final  . Eosinophils Relative 12/03/2019 1.6  % Final  . Basophils Relative 12/03/2019 0.5  % Final    ECG: Date: 11/27/2019 Time ECG obtained: 0810 AM Rate: 55 bpm Rhythm: sinus bradycardia Axis (leads I and aVF): Normal Intervals: PR 208 ms. QRS 84 ms. QTc 361 ms. ST segment and T wave changes: Nonspecific anterior precordial ST and T wave abnormality  Comparison: Similar to previous tracing obtained on 05/26/2019   IMAGING / PROCEDURES: LEXISCAN performed on 02/21/2019 1. LVEF 60% 2. No regional wall motion abnormalities 3. No EKG changes concerning for ischemia at peak stress during recovery 4. Resting EKG with nonspecific T wave abnormality to the anterior precordial leads 5. No evidence of significant myocardial ischemia 6. CT attenuation correction images with mild aortic atherosclerosis, moderate 2 vessel coronary calcification LAD and LCx, mild calcification ostial/proximal RCA 7. Low risk  scan  ECHOCARDIOGRAM performed on 12/17/2018 1. Left ventricular ejection fraction, by visual estimation, is 60 to 65%.  2. The left ventricle has normal function. 3. There is mildly increased left ventricular hypertrophy.  4. Left ventricular diastolic parameters are  consistent with Grade I diastolic dysfunction (impaired relaxation).  5. Mildly dilated left ventricular internal cavity size. 6. That being the first event that other 1 is for like next week the left ventricle has no regional wall motion abnormalities.  7. Global right ventricle has normal systolic function. 8. The right  ventricular size is normal. No increase in right ventricular wall  thickness.  9. Left atrial size was normal.  10. Right atrial size was normal.  11. Trivial pericardial effusion is present. The mitral valve is normal in structure. Trace mitral valve regurgitation.  12. The tricuspid valve is normal in structure. Tricuspid valve regurgitation is mild.  13. The aortic valve was not well visualized. Aortic valve regurgitation is not visualized. No evidence of aortic valve sclerosis or stenosis.  14. The pulmonic valve was grossly normal. Pulmonic valve regurgitation is trivial.  15. Normal pulmonary artery systolic pressure.  16. The inferior vena cava is normal in size with <50% respiratory variability, suggesting right atrial pressure of 8 mmHg.  17. The interatrial septum was not well visualized.   LONG TERM CARDIAC EVENT MONITOR (ZIO PATCH) STUDY performed on 11/18/2018 1. Minimum HR of 50 bpm, max HR of 190 bpm, and avg HR of 66 bpm. 2. Predominant underlying rhythm was Sinus Rhythm.   3. Slight P wave morphology changes were noted.  4. 46 Supraventricular Tachycardia runs occurred, the run with the fastest interval lasting 4 beats with a max rate of 190 bpm, the longest lasting 14.6 secs (26 beats) with an avg rate of 141 bpm.  5. Some episodes of Supraventricular Tachycardia may be possible Atrial Tachycardia with variable block. 6. Supraventricular Tachycardia was detected within +/- 45 seconds of symptomatic patient event(s).  7. Isolated SVEs were rare (<1.0%), SVE Couplets were rare (<1.0%), and SVE Triplets were rare (<1.0%).  LEFT HEART CATHETERIZATION AND  CORONARY ANGIOGRAPHY performed on 01/19/18/2013 1. LVEF by ventriculogram 45% 2. Proximal RCA 30% stenosis 3. Proximal RCA 50% stenosis extending diffuse to mid RCA 4. Mid RCA 100% stenosis 5. Mid LCx 20% stenosis 6. Distal LCx 30% stenosis 7. OM1 90% stenosis 8. OM2 90% stenosis 9. OM2 50% stenosis 10. OM2 50% stenosis 11. Ramus intermedius 30% stenosis 12. Mid LAD 20% stenosis 13. Mid LAD 30% stenosis 14. Distal LAD 50% stenosis 15. Left main normal  16. Pre-dilated with 2.75 x 47m Sprinter at 8 atm. 2.75 x 135mXience drug eluting stent at 13 atm. Excellent angiographic result    Impression and Plan:  Elizabeth STAPLESas been referred for pre-anesthesia review and clearance prior to her undergoing the planned anesthetic and procedural courses. Available labs, pertinent testing, and imaging results were personally reviewed by me. This patient has been appropriately cleared by cardiology (AGaren LahMD) and internal/family medicine (TLucio EdwardPA-C).   Based on clinical review performed today (01/20/20), barring any significant acute changes in the patient's overall condition, it is anticipated that she will be able to proceed with the planned surgical intervention. Any acute changes in clinical condition may necessitate her procedure being postponed and/or cancelled. Pre-surgical instructions were reviewed with the patient during her PAT appointment and questions were fielded by PAT clinical staff.  BrHonor LohMSN, APRN, FNP-C, CEN CoKind[facility] Hospital Spring  Peri-operative Services Nurse Practitioner Phone: [phone] 01/20/20 11:50 AM  NOTE: This note has been prepared using Dragon dictation software. Despite my best ability to proofread, there is always the potential that unintentional transcriptional errors may still occur from this process.

## 2020-01-20 ENCOUNTER — Other Ambulatory Visit
Admission: RE | Admit: 2020-01-20 | Discharge: 2020-01-20 | Disposition: A | Discharge status: Home / Self Care | Payer: BC Managed Care – PPO | Source: Ambulatory Visit | Attending: Surgery | Admitting: Surgery

## 2020-01-20 ENCOUNTER — Other Ambulatory Visit: Payer: Self-pay

## 2020-01-20 DIAGNOSIS — Z20822 Contact with and (suspected) exposure to covid-19: Secondary | ICD-10-CM | POA: Insufficient documentation

## 2020-01-20 DIAGNOSIS — Z01812 Encounter for preprocedural laboratory examination: Secondary | ICD-10-CM | POA: Insufficient documentation

## 2020-01-20 LAB — BASIC METABOLIC PANEL
Anion gap: 9 (ref 5–15)
BUN: 10 mg/dL (ref 6–20)
CO2: 26 mmol/L (ref 22–32)
Calcium: 9 mg/dL (ref 8.9–10.3)
Chloride: 103 mmol/L (ref 98–111)
Creatinine, Ser: 0.89 mg/dL (ref 0.44–1.00)
GFR, Estimated: >60 mL/min (ref >60)
Glucose, Bld: 105 mg/dL — ABNORMAL HIGH (ref 70–99)
Potassium: 3.8 mmol/L (ref 3.5–5.1)
Sodium: 138 mmol/L (ref 135–145)

## 2020-01-20 LAB — SARS CORONAVIRUS 2 (TAT 6-24 HRS): SARS Coronavirus 2: NEGATIVE

## 2020-01-22 ENCOUNTER — Ambulatory Visit: Payer: BC Managed Care – PPO | Admitting: Urgent Care

## 2020-01-22 ENCOUNTER — Other Ambulatory Visit: Payer: Self-pay

## 2020-01-22 ENCOUNTER — Encounter
Admission: RE | Disposition: A | Discharge status: Home / Self Care | Payer: Self-pay | Source: Home / Self Care | Attending: Surgery

## 2020-01-22 ENCOUNTER — Observation Stay
Admission: RE | Admit: 2020-01-22 | Discharge: 2020-01-23 | Disposition: A | Discharge status: Home / Self Care | Payer: BC Managed Care – PPO | Attending: Surgery | Admitting: Surgery

## 2020-01-22 ENCOUNTER — Encounter: Payer: Self-pay | Admitting: Surgery

## 2020-01-22 DIAGNOSIS — I2510 Atherosclerotic heart disease of native coronary artery without angina pectoris: Secondary | ICD-10-CM | POA: Diagnosis not present

## 2020-01-22 DIAGNOSIS — Z87891 Personal history of nicotine dependence: Secondary | ICD-10-CM | POA: Insufficient documentation

## 2020-01-22 DIAGNOSIS — Z7982 Long term (current) use of aspirin: Secondary | ICD-10-CM | POA: Insufficient documentation

## 2020-01-22 DIAGNOSIS — K653 Choleperitonitis: Secondary | ICD-10-CM

## 2020-01-22 DIAGNOSIS — I5030 Unspecified diastolic (congestive) heart failure: Secondary | ICD-10-CM | POA: Insufficient documentation

## 2020-01-22 DIAGNOSIS — E039 Hypothyroidism, unspecified: Secondary | ICD-10-CM | POA: Diagnosis not present

## 2020-01-22 DIAGNOSIS — Z79899 Other long term (current) drug therapy: Secondary | ICD-10-CM | POA: Insufficient documentation

## 2020-01-22 DIAGNOSIS — K8000 Calculus of gallbladder with acute cholecystitis without obstruction: Secondary | ICD-10-CM | POA: Diagnosis present

## 2020-01-22 DIAGNOSIS — Z7984 Long term (current) use of oral hypoglycemic drugs: Secondary | ICD-10-CM | POA: Insufficient documentation

## 2020-01-22 DIAGNOSIS — E119 Type 2 diabetes mellitus without complications: Secondary | ICD-10-CM | POA: Diagnosis not present

## 2020-01-22 DIAGNOSIS — I110 Hypertensive heart disease with heart failure: Secondary | ICD-10-CM | POA: Insufficient documentation

## 2020-01-22 DIAGNOSIS — Z9049 Acquired absence of other specified parts of digestive tract: Secondary | ICD-10-CM

## 2020-01-22 HISTORY — DX: Hyperlipidemia, unspecified: E78.5

## 2020-01-22 HISTORY — DX: Unspecified diastolic (congestive) heart failure: I50.30

## 2020-01-22 LAB — GLUCOSE, CAPILLARY
Glucose-Capillary: 110 mg/dL — ABNORMAL HIGH (ref 70–99)
Glucose-Capillary: 156 mg/dL — ABNORMAL HIGH (ref 70–99)
Glucose-Capillary: 120 mg/dL — ABNORMAL HIGH (ref 70–99)
Glucose-Capillary: 168 mg/dL — ABNORMAL HIGH (ref 70–99)

## 2020-01-22 SURGERY — CHOLECYSTECTOMY, ROBOT-ASSISTED, LAPAROSCOPIC
Anesthesia: General

## 2020-01-22 MED ORDER — CARVEDILOL 12.5 MG PO TABS: 12.5000 mg | ORAL_TABLET | Freq: Two times a day (BID) | ORAL | Status: DC

## 2020-01-22 MED ORDER — INSULIN ASPART 100 UNIT/ML ~~LOC~~ SOLN
0.0000 [IU] | Freq: Every day | SUBCUTANEOUS | Status: DC
  Administered 2020-01-22: 4 [IU] via SUBCUTANEOUS

## 2020-01-22 MED ORDER — ACETAMINOPHEN 500 MG PO TABS
ORAL_TABLET | ORAL | Status: AC
  Administered 2020-01-22: 1000 mg via ORAL
  Filled 2020-01-22: qty 2

## 2020-01-22 MED ORDER — INSULIN ASPART 100 UNIT/ML ~~LOC~~ SOLN
0.0000 [IU] | Freq: Three times a day (TID) | SUBCUTANEOUS | Status: DC
  Administered 2020-01-23: 3 [IU] via SUBCUTANEOUS

## 2020-01-22 MED ORDER — KETOROLAC TROMETHAMINE 30 MG/ML IJ SOLN
INTRAMUSCULAR | Status: AC
  Administered 2020-01-22: 30 mg via INTRAVENOUS
  Filled 2020-01-22: qty 1

## 2020-01-22 MED ORDER — GLYCOPYRROLATE 0.2 MG/ML IJ SOLN
INTRAMUSCULAR | Status: DC | PRN
  Administered 2020-01-22: .2 mg via INTRAVENOUS

## 2020-01-22 MED ORDER — ONDANSETRON HCL 4 MG/2ML IJ SOLN
4.0000 mg | Freq: Once | INTRAMUSCULAR | Status: AC
  Administered 2020-01-22: 4 mg via INTRAVENOUS

## 2020-01-22 MED ORDER — ROCURONIUM BROMIDE 100 MG/10ML IV SOLN
INTRAVENOUS | Status: DC | PRN
  Administered 2020-01-22: 30 mg via INTRAVENOUS
  Administered 2020-01-22 (×3): 10 mg via INTRAVENOUS
  Administered 2020-01-22: 5 mg via INTRAVENOUS
  Administered 2020-01-22: 50 mg via INTRAVENOUS
  Administered 2020-01-22: 20 mg via INTRAVENOUS

## 2020-01-22 MED ORDER — BUPIVACAINE-EPINEPHRINE (PF) 0.25% -1:200000 IJ SOLN
INTRAMUSCULAR | Status: AC
  Filled 2020-01-22: qty 30

## 2020-01-22 MED ORDER — PIPERACILLIN-TAZOBACTAM 3.375 G IVPB
3.3750 g | Freq: Three times a day (TID) | INTRAVENOUS | Status: DC
  Administered 2020-01-23: 3.375 g via INTRAVENOUS
  Filled 2020-01-22: qty 50

## 2020-01-22 MED ORDER — ACETAMINOPHEN 500 MG PO TABS: 1000.0000 mg | ORAL_TABLET | ORAL | Status: AC

## 2020-01-22 MED ORDER — LEVOTHYROXINE SODIUM 150 MCG PO TABS
150.0000 ug | ORAL_TABLET | ORAL | Status: DC
  Administered 2020-01-23: 150 ug via ORAL
  Filled 2020-01-22 (×2): qty 1

## 2020-01-22 MED ORDER — CHLORHEXIDINE GLUCONATE 0.12 % MT SOLN
OROMUCOSAL | Status: AC
  Administered 2020-01-22: 15 mL via OROMUCOSAL
  Filled 2020-01-22: qty 15

## 2020-01-22 MED ORDER — HYDROMORPHONE HCL 1 MG/ML IJ SOLN
INTRAMUSCULAR | Status: AC
  Filled 2020-01-22: qty 0.5

## 2020-01-22 MED ORDER — PIPERACILLIN-TAZOBACTAM 3.375 G IVPB
INTRAVENOUS | Status: AC
  Administered 2020-01-22: 3.375 g via INTRAVENOUS
  Filled 2020-01-22: qty 50

## 2020-01-22 MED ORDER — PHENYLEPHRINE HCL (PRESSORS) 10 MG/ML IV SOLN
INTRAVENOUS | Status: DC | PRN
  Administered 2020-01-22 (×2): 200 ug via INTRAVENOUS
  Administered 2020-01-22: 100 ug via INTRAVENOUS
  Administered 2020-01-22: 200 ug via INTRAVENOUS
  Administered 2020-01-22: 100 ug via INTRAVENOUS

## 2020-01-22 MED ORDER — SUGAMMADEX SODIUM 500 MG/5ML IV SOLN
INTRAVENOUS | Status: DC | PRN
  Administered 2020-01-22: 100 mg via INTRAVENOUS
  Administered 2020-01-22: 20 mg via INTRAVENOUS
  Administered 2020-01-22: 30 mg via INTRAVENOUS
  Administered 2020-01-22: 100 mg via INTRAVENOUS
  Administered 2020-01-22: 50 mg via INTRAVENOUS
  Administered 2020-01-22 (×2): 100 mg via INTRAVENOUS

## 2020-01-22 MED ORDER — CARVEDILOL 12.5 MG PO TABS
ORAL_TABLET | ORAL | Status: AC
  Administered 2020-01-22: 12.5 mg via ORAL
  Filled 2020-01-22: qty 1

## 2020-01-22 MED ORDER — INDOCYANINE GREEN 25 MG IV SOLR
2.5000 mg | INTRAVENOUS | Status: AC
  Administered 2020-01-22: 2.5 mg via INTRAVENOUS
  Filled 2020-01-22: qty 1

## 2020-01-22 MED ORDER — HYDROMORPHONE HCL 1 MG/ML IJ SOLN
0.5000 mg | INTRAMUSCULAR | Status: DC | PRN
  Administered 2020-01-22: 0.5 mg via INTRAVENOUS

## 2020-01-22 MED ORDER — SODIUM CHLORIDE 0.9 % IV SOLN: INTRAVENOUS | Status: DC

## 2020-01-22 MED ORDER — CEFAZOLIN SODIUM-DEXTROSE 2-4 GM/100ML-% IV SOLN
2.0000 g | INTRAVENOUS | Status: AC
  Administered 2020-01-22 (×2): 2 g via INTRAVENOUS

## 2020-01-22 MED ORDER — ONDANSETRON HCL 4 MG/2ML IJ SOLN
INTRAMUSCULAR | Status: AC
  Filled 2020-01-22: qty 2

## 2020-01-22 MED ORDER — ACETAMINOPHEN 500 MG PO TABS: 1000.0000 mg | ORAL_TABLET | Freq: Four times a day (QID) | ORAL | Status: DC

## 2020-01-22 MED ORDER — ONDANSETRON HCL 4 MG/2ML IJ SOLN
INTRAMUSCULAR | Status: DC | PRN
  Administered 2020-01-22: 4 mg via INTRAVENOUS

## 2020-01-22 MED ORDER — LACTATED RINGERS IV SOLN: INTRAVENOUS | Status: DC

## 2020-01-22 MED ORDER — LEVOTHYROXINE SODIUM 137 MCG PO TABS: 137.0000 ug | ORAL_TABLET | ORAL | Status: DC

## 2020-01-22 MED ORDER — IRBESARTAN 150 MG PO TABS
150.0000 mg | ORAL_TABLET | Freq: Every day | ORAL | Status: DC
  Filled 2020-01-22: qty 1

## 2020-01-22 MED ORDER — SUGAMMADEX SODIUM 500 MG/5ML IV SOLN
INTRAVENOUS | Status: AC
  Filled 2020-01-22: qty 5

## 2020-01-22 MED ORDER — FENTANYL CITRATE (PF) 250 MCG/5ML IJ SOLN
INTRAMUSCULAR | Status: AC
  Filled 2020-01-22: qty 5

## 2020-01-22 MED ORDER — PROPOFOL 10 MG/ML IV BOLUS
INTRAVENOUS | Status: AC
  Filled 2020-01-22: qty 40

## 2020-01-22 MED ORDER — GABAPENTIN 300 MG PO CAPS
ORAL_CAPSULE | ORAL | Status: AC
  Administered 2020-01-22: 300 mg via ORAL
  Filled 2020-01-22: qty 1

## 2020-01-22 MED ORDER — CEFAZOLIN SODIUM 1 G IJ SOLR
INTRAMUSCULAR | Status: AC
  Filled 2020-01-22: qty 20

## 2020-01-22 MED ORDER — CHLORHEXIDINE GLUCONATE 0.12 % MT SOLN: 15.0000 mL | Freq: Once | OROMUCOSAL | Status: AC

## 2020-01-22 MED ORDER — MIDAZOLAM HCL 2 MG/2ML IJ SOLN
INTRAMUSCULAR | Status: AC
  Filled 2020-01-22: qty 2

## 2020-01-22 MED ORDER — FENTANYL CITRATE (PF) 100 MCG/2ML IJ SOLN
INTRAMUSCULAR | Status: AC
  Filled 2020-01-22: qty 2

## 2020-01-22 MED ORDER — KETOROLAC TROMETHAMINE 30 MG/ML IJ SOLN: 30.0000 mg | Freq: Four times a day (QID) | INTRAMUSCULAR | Status: DC

## 2020-01-22 MED ORDER — SODIUM CHLORIDE 0.9 % IV SOLN: INTRAVENOUS | Status: DC | PRN

## 2020-01-22 MED ORDER — PROPOFOL 10 MG/ML IV BOLUS
INTRAVENOUS | Status: DC | PRN
  Administered 2020-01-22: 150 mg via INTRAVENOUS
  Administered 2020-01-22: 20 mg via INTRAVENOUS

## 2020-01-22 MED ORDER — LIDOCAINE HCL (PF) 2 % IJ SOLN
INTRAMUSCULAR | Status: AC
  Filled 2020-01-22: qty 5

## 2020-01-22 MED ORDER — HYDROMORPHONE HCL 1 MG/ML IJ SOLN
INTRAMUSCULAR | Status: AC
  Administered 2020-01-22: 0.5 mg via INTRAVENOUS
  Filled 2020-01-22: qty 0.5

## 2020-01-22 MED ORDER — CHLORHEXIDINE GLUCONATE CLOTH 2 % EX PADS: 6.0000 | MEDICATED_PAD | Freq: Once | CUTANEOUS | Status: DC

## 2020-01-22 MED ORDER — CEFAZOLIN SODIUM-DEXTROSE 2-4 GM/100ML-% IV SOLN
INTRAVENOUS | Status: AC
  Filled 2020-01-22: qty 100

## 2020-01-22 MED ORDER — ROCURONIUM BROMIDE 10 MG/ML (PF) SYRINGE
PREFILLED_SYRINGE | INTRAVENOUS | Status: AC
  Filled 2020-01-22: qty 10

## 2020-01-22 MED ORDER — OXYCODONE HCL 5 MG/5ML PO SOLN: 5.0000 mg | Freq: Once | ORAL | Status: DC | PRN

## 2020-01-22 MED ORDER — ENOXAPARIN SODIUM 40 MG/0.4ML ~~LOC~~ SOLN: 40.0000 mg | SUBCUTANEOUS | Status: DC

## 2020-01-22 MED ORDER — OXYCODONE HCL 5 MG PO TABS: 5.0000 mg | ORAL_TABLET | ORAL | Status: DC | PRN

## 2020-01-22 MED ORDER — EPHEDRINE SULFATE 50 MG/ML IJ SOLN
INTRAMUSCULAR | Status: DC | PRN
  Administered 2020-01-22: 10 mg via INTRAVENOUS

## 2020-01-22 MED ORDER — MIDAZOLAM HCL 2 MG/2ML IJ SOLN
INTRAMUSCULAR | Status: DC | PRN
  Administered 2020-01-22: 1 mg via INTRAVENOUS

## 2020-01-22 MED ORDER — SIMETHICONE 80 MG PO CHEW
80.0000 mg | CHEWABLE_TABLET | Freq: Four times a day (QID) | ORAL | Status: DC | PRN
  Administered 2020-01-22: 80 mg via ORAL
  Filled 2020-01-22 (×3): qty 1

## 2020-01-22 MED ORDER — FENTANYL CITRATE (PF) 100 MCG/2ML IJ SOLN
25.0000 ug | INTRAMUSCULAR | Status: DC | PRN
  Administered 2020-01-22: 25 ug via INTRAVENOUS

## 2020-01-22 MED ORDER — ONDANSETRON 4 MG PO TBDP: 4.0000 mg | ORAL_TABLET | Freq: Four times a day (QID) | ORAL | Status: DC | PRN

## 2020-01-22 MED ORDER — ORAL CARE MOUTH RINSE: 15.0000 mL | Freq: Once | OROMUCOSAL | Status: AC

## 2020-01-22 MED ORDER — FLUTICASONE PROPIONATE 50 MCG/ACT NA SUSP
2.0000 | Freq: Every day | NASAL | Status: DC
  Filled 2020-01-22: qty 16

## 2020-01-22 MED ORDER — SODIUM CHLORIDE 0.9 % IV SOLN
INTRAVENOUS | Status: DC | PRN
  Administered 2020-01-22: 40 ug/min via INTRAVENOUS

## 2020-01-22 MED ORDER — LIDOCAINE HCL (CARDIAC) PF 100 MG/5ML IV SOSY
PREFILLED_SYRINGE | INTRAVENOUS | Status: DC | PRN
  Administered 2020-01-22: 100 mg via INTRAVENOUS

## 2020-01-22 MED ORDER — GLYCOPYRROLATE 0.2 MG/ML IJ SOLN
INTRAMUSCULAR | Status: AC
  Filled 2020-01-22: qty 1

## 2020-01-22 MED ORDER — EZETIMIBE 10 MG PO TABS
10.0000 mg | ORAL_TABLET | Freq: Every day | ORAL | Status: DC
  Filled 2020-01-22: qty 1

## 2020-01-22 MED ORDER — FENTANYL CITRATE (PF) 100 MCG/2ML IJ SOLN
INTRAMUSCULAR | Status: DC | PRN
  Administered 2020-01-22 (×5): 50 ug via INTRAVENOUS

## 2020-01-22 MED ORDER — GABAPENTIN 300 MG PO CAPS: 300.0000 mg | ORAL_CAPSULE | ORAL | Status: AC

## 2020-01-22 MED ORDER — EPHEDRINE 5 MG/ML INJ
INTRAVENOUS | Status: AC
  Filled 2020-01-22: qty 10

## 2020-01-22 MED ORDER — ONDANSETRON HCL 4 MG/2ML IJ SOLN: 4.0000 mg | Freq: Four times a day (QID) | INTRAMUSCULAR | Status: DC | PRN

## 2020-01-22 MED ORDER — BUPIVACAINE-EPINEPHRINE (PF) 0.25% -1:200000 IJ SOLN
INTRAMUSCULAR | Status: DC | PRN
  Administered 2020-01-22: 30 mL

## 2020-01-22 MED ORDER — PHENYLEPHRINE HCL (PRESSORS) 10 MG/ML IV SOLN
INTRAVENOUS | Status: AC
  Filled 2020-01-22: qty 1

## 2020-01-22 MED ORDER — PANTOPRAZOLE SODIUM 40 MG IV SOLR
40.0000 mg | Freq: Every day | INTRAVENOUS | Status: DC
  Administered 2020-01-22: 40 mg via INTRAVENOUS
  Filled 2020-01-22 (×2): qty 40

## 2020-01-22 MED ORDER — AMLODIPINE BESYLATE 10 MG PO TABS
10.0000 mg | ORAL_TABLET | Freq: Every day | ORAL | Status: DC
  Filled 2020-01-22: qty 1

## 2020-01-22 MED ORDER — POLYETHYLENE GLYCOL 3350 17 G PO PACK
17.0000 g | PACK | Freq: Every day | ORAL | Status: DC | PRN
  Filled 2020-01-22: qty 1

## 2020-01-22 MED ORDER — AMLODIPINE BESYLATE-VALSARTAN 10-160 MG PO TABS: 1.0000 | ORAL_TABLET | Freq: Every day | ORAL | Status: DC

## 2020-01-22 MED ORDER — LACTATED RINGERS IV SOLN: 125.0000 mL/h | INTRAVENOUS | Status: DC

## 2020-01-22 MED ORDER — MECLIZINE HCL 25 MG PO TABS
25.0000 mg | ORAL_TABLET | Freq: Three times a day (TID) | ORAL | Status: DC | PRN
  Filled 2020-01-22: qty 1

## 2020-01-22 MED ORDER — OXYCODONE HCL 5 MG PO TABS: 5.0000 mg | ORAL_TABLET | Freq: Once | ORAL | Status: DC | PRN

## 2020-01-22 SURGICAL SUPPLY — 64 items
BAG INFUSER PRESSURE 100CC (MISCELLANEOUS) ×2 IMPLANT
BULB RESERV EVAC DRAIN JP 100C (MISCELLANEOUS) ×2 IMPLANT
CANISTER SUCT 1200ML W/VALVE (MISCELLANEOUS) IMPLANT
CANNULA REDUC XI 12-8 STAPL (CANNULA) ×1
CANNULA REDUCER 12-8 DVNC XI (CANNULA) ×1 IMPLANT
CHLORAPREP W/TINT 26 (MISCELLANEOUS) ×2 IMPLANT
CLIP VESOLOCK MED LG 6/CT (CLIP) ×2 IMPLANT
COVER TIP SHEARS 8 DVNC (MISCELLANEOUS) ×1 IMPLANT
COVER TIP SHEARS 8MM DA VINCI (MISCELLANEOUS) ×1
COVER WAND RF STERILE (DRAPES) ×2 IMPLANT
CUP MEDICINE 2OZ PLAST GRAD ST (MISCELLANEOUS) ×2 IMPLANT
DECANTER SPIKE VIAL GLASS SM (MISCELLANEOUS) ×2 IMPLANT
DEFOGGER SCOPE WARMER CLEARIFY (MISCELLANEOUS) ×2 IMPLANT
DERMABOND ADVANCED (GAUZE/BANDAGES/DRESSINGS) ×1
DERMABOND ADVANCED .7 DNX12 (GAUZE/BANDAGES/DRESSINGS) ×1 IMPLANT
DRAIN CHANNEL JP 19F (MISCELLANEOUS) ×2 IMPLANT
DRAPE ARM DVNC X/XI (DISPOSABLE) ×4 IMPLANT
DRAPE COLUMN DVNC XI (DISPOSABLE) ×1 IMPLANT
DRAPE DA VINCI XI ARM (DISPOSABLE) ×4
DRAPE DA VINCI XI COLUMN (DISPOSABLE) ×1
ELECT CAUTERY BLADE TIP 2.5 (TIP) ×2
ELECT REM PT RETURN 9FT ADLT (ELECTROSURGICAL) ×2
ELECTRODE CAUTERY BLDE TIP 2.5 (TIP) ×1 IMPLANT
ELECTRODE REM PT RTRN 9FT ADLT (ELECTROSURGICAL) ×1 IMPLANT
GLOVE SURG SYN 7.0 (GLOVE) ×4 IMPLANT
GLOVE SURG SYN 7.5  E (GLOVE) ×2
GLOVE SURG SYN 7.5 E (GLOVE) ×2 IMPLANT
GOWN STRL REUS W/ TWL LRG LVL3 (GOWN DISPOSABLE) ×4 IMPLANT
GOWN STRL REUS W/TWL LRG LVL3 (GOWN DISPOSABLE) ×4
IRRIGATOR SUCT 8 DISP DVNC XI (IRRIGATION / IRRIGATOR) ×1 IMPLANT
IRRIGATOR SUCTION 8MM XI DISP (IRRIGATION / IRRIGATOR) ×1
IV NS 1000ML (IV SOLUTION) ×1
IV NS 1000ML BAXH (IV SOLUTION) ×1 IMPLANT
KIT PINK PAD W/HEAD ARE REST (MISCELLANEOUS) ×2
KIT PINK PAD W/HEAD ARM REST (MISCELLANEOUS) ×1 IMPLANT
LABEL OR SOLS (LABEL) ×2 IMPLANT
MANIFOLD NEPTUNE II (INSTRUMENTS) ×2 IMPLANT
NEEDLE HYPO 22GX1.5 SAFETY (NEEDLE) ×2 IMPLANT
NS IRRIG 500ML POUR BTL (IV SOLUTION) ×2 IMPLANT
OBTURATOR OPTICAL STANDARD 8MM (TROCAR) ×1
OBTURATOR OPTICAL STND 8 DVNC (TROCAR) ×1
OBTURATOR OPTICALSTD 8 DVNC (TROCAR) ×1 IMPLANT
PACK LAP CHOLECYSTECTOMY (MISCELLANEOUS) ×2 IMPLANT
PENCIL ELECTRO HAND CTR (MISCELLANEOUS) ×2 IMPLANT
POUCH SPECIMEN RETRIEVAL 10MM (ENDOMECHANICALS) ×2 IMPLANT
RELOAD STAPLER 3.5X60 BLU DVNC (STAPLE) ×1 IMPLANT
SEAL CANN UNIV 5-8 DVNC XI (MISCELLANEOUS) ×4 IMPLANT
SEAL XI 5MM-8MM UNIVERSAL (MISCELLANEOUS) ×4
SET TUBE SMOKE EVAC HIGH FLOW (TUBING) ×2 IMPLANT
SOLUTION ELECTROLUBE (MISCELLANEOUS) ×2 IMPLANT
SPONGE LAP 18X18 RF (DISPOSABLE) IMPLANT
SPONGE LAP 4X18 RFD (DISPOSABLE) ×2 IMPLANT
STAPLER 60 DA VINCI SURE FORM (STAPLE) ×1
STAPLER 60 SUREFORM DVNC (STAPLE) ×1 IMPLANT
STAPLER CANNULA SEAL DVNC XI (STAPLE) ×1 IMPLANT
STAPLER CANNULA SEAL XI (STAPLE) ×1
STAPLER RELOAD 3.5X60 BLU DVNC (STAPLE) ×1
STAPLER RELOAD 3.5X60 BLUE (STAPLE) ×1
SUT ETHILON 3-0 FS-10 30 BLK (SUTURE) ×2
SUT MNCRL AB 4-0 PS2 18 (SUTURE) ×2 IMPLANT
SUT VICRYL 0 AB UR-6 (SUTURE) ×4 IMPLANT
SUTURE EHLN 3-0 FS-10 30 BLK (SUTURE) ×1 IMPLANT
TAPE TRANSPORE STRL 2 31045 (GAUZE/BANDAGES/DRESSINGS) ×2 IMPLANT
TROCAR BALLN GELPORT 12X130M (ENDOMECHANICALS) ×2 IMPLANT

## 2020-01-22 NOTE — Transfer of Care (Signed)
Immediate Anesthesia Transfer of Care Note  Patient: Elizabeth Russo  Procedure(s) Performed: XI ROBOTIC ASSISTED LAPAROSCOPIC CHOLECYSTECTOMY (N/A ) INDOCYANINE GREEN FLUORESCENCE IMAGING (ICG) (N/A )  Patient Location: PACU  Anesthesia Type:General  Level of Consciousness: drowsy, patient cooperative and responds to stimulation  Airway & Oxygen Therapy: Patient Spontanous Breathing and Patient connected to face mask oxygen  Post-op Assessment: Report given to RN  Post vital signs: Reviewed and stable  Last Vitals:  Vitals Value Taken Time  BP 112/72 01/22/20 1254  Temp 36.1 C 01/22/20 1254  Pulse 60 01/22/20 1258  Resp 22 01/22/20 1258  SpO2 99 % 01/22/20 1258  Vitals shown include unvalidated device data.  Last Pain:  Vitals:   01/22/20 1254  TempSrc:   PainSc: Asleep         Complications: No complications documented.

## 2020-01-22 NOTE — Op Note (Addendum)
Procedure Date:  01/22/2020  Pre-operative Diagnosis:  Acute cholecystitis  Post-operative Diagnosis:  Acute cholecystitis  Procedure:   1.  Robotic assisted cholecystectomy with ICG FireFly cholangiogram 2.  Extensive robotic assisted lysis of adhesions  Surgeon:  Melvyn Neth, MD  Assistant:  Clent Ridges, PA-S; Caroleen Hamman, MD.  Anesthesia:  General endotracheal  Estimated Blood Loss:  100 ml  Specimens:  Gallbladder and gallstones  Complications:  None  Indications for Procedure:  This is a 60 y.o. female with a history of acute cholecystitis on 12/03/19, who presents now for cholecystectomy.  The benefits, complications, treatment options, and expected outcomes were discussed with the patient. The risks of bleeding, infection, recurrence of symptoms, failure to resolve symptoms, bile duct damage, bile duct leak, retained common bile duct stone, bowel injury, and need for further procedures were all discussed with the patient and she was willing to proceed.  Description of Procedure: The patient was correctly identified in the preoperative area and brought into the operating room.  The patient was placed supine with VTE prophylaxis in place.  Appropriate time-outs were performed.  Anesthesia was induced and the patient was intubated.  Appropriate antibiotics were infused.  The abdomen was prepped and draped in a sterile fashion. An infraumbilical incision was made. A cutdown technique was used to enter the abdominal cavity without injury, and a 12 mm robotic port was inserted.  Pneumoperitoneum was obtained with appropriate opening pressures.  Three 8-mm ports were placed in the mid abdomen at the level of the umbilicus under direct visualization.  The DaVinci platform was docked, camera targeted, and instruments were placed under direct visualization.  Upon initial inspection, the gallbladder was not readily identified, as the omentum was adhered over it.  We started  with lysis of adhesions, freeing up the gallbladder fundus.  The gallbladder was also very distended and attempt was needle decompression was only partially successful.  We continued with lysis of adhesions.  The omentum, distal stomach and proximal duodenum were adhered towards the gallbladder.  This required careful dissection to avoid any injury.  This took approximately 2 hrs to complete.  Once the gallbladder was fully dissected, we were able to retract it cephalad for better exposure.  The gallbladder was still distended and I was unable to grasp it by the neck.  We attempted needle decompression again, but also was only partially unsuccessful.  We then proceeded with dissection of the cystic duct and cystic artery.  FireFly cholangiogram was obtained, and we could identify the distal cystic duct and common bile duct.  However, there was a lot of scarring over the cystic duct, and it was unclear where the neck of the gallbladder and cystic duct met.  Given that, it was decided to dissect the cystic artery first.  The artery was clipped twice proximally and once distally, cutting in between.  I called Dr. Dahlia Byes to come into the operating room at that time to get a second opinion about the best place to transect the gallbladder.  He confirmed with me that transecting at the distal gallbladder neck would be best given the amount of inflammation.  We then dissected the distal portion of the gallbladder at its neck off the liver bed on both sides.  After doing this, a blue load 60 mm stapler was used to staple across the neck of the gallbladder, as distal as possible.  FireFly cholangiogram was done again to confirm that the stapler was not too close to the common  bile duct.  Once completed, the gallbladder was then taken off the liver bed using cautery dissection.  While doing this, two gallbladder injuries were created, spilling stones and purulent fluid.  After completion of the dissection, the gallbladder was  placed in an EndoCatch bag.  Robotic suction-irrigator was used to irrigate and suction all the purulent fluid off the RUQ.  An additional EndoCatch bag was used to place all the spilled stones into the bag.  Following this, further irrigation was done to clean the area.  Cautery was used for hemostasis and to cauterize the liver bed given all the inflammation.  The right upper quadrant was then inspected again revealing intact clips and staple line, no bleeding, and no ductal injury.  The DaVinci platform was undocked.  A 19 Fr. Blake drain was placed through the right lateral port site towards the gallbladder fossa.  The 8 mm ports were removed under direct visualization and the 12 mm port was removed.  The Endocatch bags were brought out via the umbilical incision which needed to be extended in order to accommodate the gallbladder. The fascial opening was closed using 0 vicryl sutures.  Local anesthetic was infused in all incisions and the incisions were closed with 4-0 Monocryl.  The drain was secured using 3-0 Nylon.  The wounds were cleaned and sealed with DermaBond.  The drain site was dressed with 4x4 gauze and TegaDerm.  The patient was emerged from anesthesia and extubated and brought to the recovery room for further management.  The patient tolerated the procedure well and all counts were correct at the end of the case.   Melvyn Neth, MD

## 2020-01-22 NOTE — Anesthesia Procedure Notes (Signed)
Procedure Name: Intubation Date/Time: 01/22/2020 7:41 AM Performed by: Gayland Curry, CRNA Pre-anesthesia Checklist: Patient identified, Emergency Drugs available, Suction available and Patient being monitored Patient Re-evaluated:Patient Re-evaluated prior to induction Oxygen Delivery Method: Circle system utilized Preoxygenation: Pre-oxygenation with 100% oxygen Induction Type: IV induction Ventilation: Mask ventilation without difficulty Laryngoscope Size: Mac and 3 Grade View: Grade I Tube type: Oral Tube size: 7.0 mm Number of attempts: 1 Placement Confirmation: ETT inserted through vocal cords under direct vision,  positive ETCO2 and breath sounds checked- equal and bilateral Secured at: 22 cm Tube secured with: Tape Dental Injury: Teeth and Oropharynx as per pre-operative assessment  Comments: OA would improve mask ventilation

## 2020-01-22 NOTE — Anesthesia Preprocedure Evaluation (Addendum)
Anesthesia Evaluation  Patient identified by MRN, date of birth, ID band Patient awake    Reviewed: Allergy & Precautions, H&P , NPO status , Patient's Chart, lab work & pertinent test results  History of Anesthesia Complications Negative for: history of anesthetic complications  Airway Mallampati: III  TM Distance: >3 FB Neck ROM: limited    Dental  (+) Chipped, Poor Dentition, Missing   Pulmonary neg shortness of breath, former smoker,    Pulmonary exam normal        Cardiovascular Exercise Tolerance: Good hypertension, (-) angina+ CAD, + Past MI and + Cardiac Stents  (-) DOE Normal cardiovascular exam+ dysrhythmias Atrial Fibrillation      Neuro/Psych negative neurological ROS  negative psych ROS   GI/Hepatic negative GI ROS, Neg liver ROS, neg GERD  ,  Endo/Other  diabetes, Type 2Hypothyroidism   Renal/GU      Musculoskeletal  (+) Arthritis ,   Abdominal   Peds  Hematology negative hematology ROS (+)   Anesthesia Other Findings Patient has cardiac clearance for this procedure.   Past Medical History: No date: (HFpEF) heart failure with preserved ejection fraction ([place]) 02/13/2011: Atrial fibrillation ([place]) 04/25/2014: Breast mass, right No date: CAD (coronary artery disease) No date: Diabetes mellitus without complication ([place]) No date: Diverticulosis No date: Family history of colon cancer in father 03/19/2014: Goiter 08/25/2011: Hearing loss No date: HLD (hyperlipidemia) No date: Hypertension No date: Hypothyroid 10/23/2014: Localized enlarged lymph nodes     Comment:  cervical  12/25/2014: Low back pain 06/26/2014: Morbid obesity ([place]) 04/13/2017: Myalgia due to statin     Comment:  Intolerant to three different statins 01/2011: NSTEMI (non-ST elevated myocardial infarction) ([place])     Comment:  Subendocardial No date: Personal history of colonic polyps No date: Vertigo  Past Surgical History: No  date: ABDOMINAL HYSTERECTOMY No date: CARDIAC CATHETERIZATION 08/13/2017: COLONOSCOPY WITH PROPOFOL; N/A     Comment:  Procedure: COLONOSCOPY WITH PROPOFOL;  Surgeon: Lin Landsman, MD;  Location: [facility] ENDOSCOPY;  Service:               Gastroenterology;  Laterality: N/A; 2012: heart stent  BMI    Body Mass Index: 43.78 kg/m      Reproductive/Obstetrics negative OB ROS                            Anesthesia Physical Anesthesia Plan  ASA: III  Anesthesia Plan: General ETT   Post-op Pain Management:    Induction: Intravenous  PONV Risk Score and Plan: Ondansetron, Dexamethasone, Midazolam and Treatment may vary due to age or medical condition  Airway Management Planned: Oral ETT  Additional Equipment:   Intra-op Plan:   Post-operative Plan: Extubation in OR  Informed Consent: I have reviewed the patients History and Physical, chart, labs and discussed the procedure including the risks, benefits and alternatives for the proposed anesthesia with the patient or authorized representative who has indicated his/her understanding and acceptance.     Dental Advisory Given  Plan Discussed with: Anesthesiologist, CRNA and Surgeon  Anesthesia Plan Comments: (Patient consented for risks of anesthesia including but not limited to:  - adverse reactions to medications - damage to eyes, teeth, lips or other oral mucosa - nerve damage due to positioning  - sore throat or hoarseness - Damage to heart, brain, nerves, lungs, other parts of body or loss of  life  Patient voiced understanding.)        Anesthesia Quick Evaluation

## 2020-01-22 NOTE — H&P (Signed)
Date:  01/22/2020  History of Present Illness: Elizabeth Russo is a 60 y.o. female with history of acute cholecystitis treated conservatively on 12/03/19.  She was seen by me on 12/19/19 and was doing well, without any further pain.  She presents today for robotic assisted cholecystectomy with ICG cholangiogram.  She denies any abdominal pain, nausea, or vomiting.  COVID test was negative.  Past Medical History: Past Medical History:  Diagnosis Date  . (HFpEF) heart failure with preserved ejection fraction ([place])   . Atrial fibrillation ([place]) 02/13/2011  . Breast mass, right 04/25/2014  . CAD (coronary artery disease)   . Diabetes mellitus without complication ([place])   . Diverticulosis   . Family history of colon cancer in father   . Goiter 03/19/2014  . Hearing loss 08/25/2011  . HLD (hyperlipidemia)   . Hypertension   . Hypothyroid   . Localized enlarged lymph nodes 10/23/2014   cervical   . Low back pain 12/25/2014  . Morbid obesity ([place]) 06/26/2014  . Myalgia due to statin 04/13/2017   Intolerant to three different statins  . NSTEMI (non-ST elevated myocardial infarction) ([place]) 01/2011   Subendocardial  . Personal history of colonic polyps   . Vertigo      Past Surgical History: Past Surgical History:  Procedure Laterality Date  . ABDOMINAL HYSTERECTOMY    . CARDIAC CATHETERIZATION    . COLONOSCOPY WITH PROPOFOL N/A 08/13/2017   Procedure: COLONOSCOPY WITH PROPOFOL;  Surgeon: Lin Landsman, MD;  Location: [facility] ENDOSCOPY;  Service: Gastroenterology;  Laterality: N/A;  . heart stent  2012    Home Medications: Prior to Admission medications   Medication Sig Start Date End Date Taking? Authorizing Provider  amLODipine-valsartan (EXFORGE) 10-160 MG tablet Take 1 tablet by mouth daily. 10/14/19  Yes Delsa Grana, PA-C  carvedilol (COREG) 25 MG tablet Take 0.5 tablets (12.5 mg total) by mouth 2 (two) times a day with meals. Patient taking differently: Take 12.5 mg by mouth 2  (two) times daily with a meal. 12/25/19  Yes Delsa Grana, PA-C  diclofenac sodium (VOLTAREN) 1 % GEL Apply 2 g topically daily as needed (pain). 06/09/15  Yes [provider]  ezetimibe (ZETIA) 10 MG tablet Take 1 tablet (10 mg total) by mouth daily. Patient taking differently: Take 10 mg by mouth daily. 07/02/19  Yes Delsa Grana, PA-C  famotidine (PEPCID) 20 MG tablet Take 1 tablet (20 mg total) by mouth 2 (two) times daily. 02/11/19  Yes Delsa Grana, PA-C  fluticasone (FLONASE) 50 MCG/ACT nasal spray Place 2 sprays into both nostrils daily. 01/08/20  Yes Delsa Grana, PA-C  furosemide (LASIX) 20 MG tablet Take when needed for Edema. Patient taking differently: Take 20 mg by mouth daily as needed for edema. Take when needed for Edema. 11/27/19  Yes Kate Sable, MD  HYDROcodone-acetaminophen (NORCO/VICODIN) 5-325 MG tablet Take 0.5-1 tablets by mouth 3 (three) times daily as needed for severe pain. 12/03/19  Yes Delsa Grana, PA-C  levothyroxine (SYNTHROID) 137 MCG tablet Take 1 tablet by mouth daily before breakfast on Tuesday, Thursday, Saturday and Sunday Patient taking differently: Take 137 mcg by mouth See admin instructions. Take 1 tablet by mouth daily before breakfast on Tuesday, Thursday, Saturday and Sunday 04/03/19  Yes Delsa Grana, PA-C  levothyroxine (SYNTHROID) 150 MCG tablet Take 1 tablet by mouth once daily on Monday, Wednesday and Friday and take 137 mcg on the other days. Patient taking differently: Take 150 mcg by mouth every Monday, Wednesday, and Friday. 07/29/19  Yes Delsa Grana, PA-C  meclizine (ANTIVERT) 25 MG tablet Take 1 tablet (25 mg total) by mouth Three (3) times a day as needed for dizziness. Patient taking differently: Take 25 mg by mouth 3 (three) times daily as needed for dizziness. 10/24/19  Yes Delsa Grana, PA-C  metoCLOPramide (REGLAN) 10 MG tablet Take 1 tablet (10 mg total) by mouth 3 (three) times daily as needed for nausea (headache / nausea).  12/03/19  Yes Delsa Grana, PA-C  ondansetron (ZOFRAN ODT) 4 MG disintegrating tablet Take 1-2 tablets (4-8 mg total) by mouth every 8 (eight) hours as needed for nausea or vomiting. 12/03/19  Yes Delsa Grana, PA-C  pantoprazole (PROTONIX) 40 MG tablet Take 1 tablet (40 mg total) by mouth daily. 12/03/19  Yes Delsa Grana, PA-C  VICTOZA 18 MG/3ML SOPN Inject 1.2 mg into the skin daily. Patient taking differently: Inject 1.2 mg into the skin at bedtime. 12/08/19  Yes Delsa Grana, PA-C  Alirocumab (PRALUENT) 75 MG/ML SOAJ Inject 75 mg into the skin every 14 (fourteen) days. 11/27/19   Kate Sable, MD  aspirin EC 81 MG tablet Take 81 mg by mouth daily.     [provider]  glucose blood test strip Use as instructed Patient not taking: Reported on 01/08/2020 09/12/18   Fredderick Severance, NP  Insulin Pen Needle (BD PEN NEEDLE NANO U/F) 32G X 4 MM MISC Use for insulin administration four times daily. Patient not taking: Reported on 01/08/2020 01/27/15   Arnetha Courser, MD  metFORMIN (GLUCOPHAGE-XR) 500 MG 24 hr tablet Take 1 tablet (500 mg total) by mouth daily with breakfast. Patient taking differently: Take 500 mg by mouth daily with breakfast. Take 1 tablet (500 mg total) by mouth daily with breakfast. 07/29/19   Delsa Grana, PA-C    Allergies: Allergies  Allergen Reactions  . Atorvastatin Other (See Comments)    Other reaction(s): Muscle Pain  . Hydralazine Other (See Comments)    Aggravated gout Aggravated gout  . Hydrochlorothiazide Other (See Comments)    Aggravated gout  . Lisinopril Cough  . Pravastatin Other (See Comments)    Other reaction(s): Muscle Pain  . Rosuvastatin Other (See Comments)    Other reaction(s): Muscle Pain    Social History:  reports that she quit smoking about 6 years ago. Her smoking use included cigarettes. She started smoking about 42 years ago. She has a 15.00 pack-year smoking history. She has never used smokeless tobacco. She reports  current alcohol use. She reports that she does not use drugs.   Family History: Family History  Problem Relation Age of Onset  . Heart disease Mother   . Diabetes Mother   . Hypertension Mother   . Hyperlipidemia Mother   . Thyroid disease Mother   . Cancer Father        colon  . Diabetes Father   . Hypertension Father   . Diabetes Sister   . Hypertension Sister   . Heart disease Sister   . Hyperlipidemia Sister   . Kidney disease Sister   . Cancer Maternal Grandfather        lung  . Heart attack Paternal Grandfather   . Heart disease Sister   . Diabetes Sister   . Heart disease Sister   . Arthritis Sister     Review of Systems: Review of Systems  Constitutional: Negative for chills and fever.  Respiratory: Negative for shortness of breath.   Cardiovascular: Negative for chest pain.  Gastrointestinal: Negative for abdominal  pain, nausea and vomiting.    Physical Exam BP 126/85   Pulse (!) 58   Temp (!) 97.3 F (36.3 C) (Temporal)   Resp 18   Ht 5\' 4"  (1.626 m)   Wt 115.7 kg   LMP  (LMP Unknown)   SpO2 100%   BMI 43.78 kg/m  CONSTITUTIONAL: No acute distress HEENT:  Normocephalic, atraumatic, extraocular motion intact. NECK: Trachea is midline, and there is no jugular venous distension.  RESPIRATORY:  Normal respiratory effort without pathologic use of accessory muscles. CARDIOVASCULAR: Regular rhythm and rate. GI: The abdomen is soft, non-distended, non-tender.  MUSCULOSKELETAL:  Normal muscle strength and tone in all four extremities.  No peripheral edema or cyanosis. SKIN: Skin turgor is normal. There are no pathologic skin lesions.  NEUROLOGIC:  Motor and sensation is grossly normal.  Cranial nerves are grossly intact. PSYCH:  Alert and oriented to person, place and time. Affect is normal.  Laboratory Analysis: Results for orders placed or performed during the hospital encounter of 01/22/20 (from the past 24 hour(s))  Glucose, capillary     Status:  Abnormal   Collection Time: 01/22/20  6:36 AM  Result Value Ref Range   Glucose-Capillary 110 (H) 70 - 99 mg/dL    Imaging: No results found.  Assessment and Plan: This is a 60 y.o. female with history of acute cholecystitis.  --Patient was cleared by both cardiology and her PCP.  She stopped her Aspirin as indicated and is ready for surgery today. --Discussed again the plan for robotic assisted cholecystectomy with ICG cholangiogram to better visualize the biliary structures.  All questions answered.   Melvyn Neth, MD [facility] Pg:  [phone]

## 2020-01-22 NOTE — Anesthesia Postprocedure Evaluation (Signed)
Anesthesia Post Note  Patient: Elizabeth Russo  Procedure(s) Performed: XI ROBOTIC ASSISTED LAPAROSCOPIC CHOLECYSTECTOMY (N/A ) INDOCYANINE GREEN FLUORESCENCE IMAGING (ICG) (N/A )  Patient location during evaluation: PACU Anesthesia Type: General Level of consciousness: awake and alert Pain management: pain level controlled Vital Signs Assessment: post-procedure vital signs reviewed and stable Respiratory status: spontaneous breathing and respiratory function stable Cardiovascular status: stable Anesthetic complications: no   No complications documented.   Last Vitals:  Vitals:   01/22/20 0617 01/22/20 1254  BP: 126/85 112/72  Pulse: (!) 58 69  Resp: 18 16  Temp: (!) 36.3 C (!) 36.1 C  SpO2: 100% 98%    Last Pain:  Vitals:   01/22/20 1254  TempSrc:   PainSc: Asleep                 Shyia Fillingim K

## 2020-01-23 DIAGNOSIS — K8000 Calculus of gallbladder with acute cholecystitis without obstruction: Secondary | ICD-10-CM | POA: Diagnosis not present

## 2020-01-23 LAB — SURGICAL PATHOLOGY

## 2020-01-23 LAB — COMPREHENSIVE METABOLIC PANEL
ALT: 36 U/L (ref 0–44)
AST: 42 U/L — ABNORMAL HIGH (ref 15–41)
Albumin: 3.1 g/dL — ABNORMAL LOW (ref 3.5–5.0)
Alkaline Phosphatase: 56 U/L (ref 38–126)
Anion gap: 6 (ref 5–15)
BUN: 10 mg/dL (ref 6–20)
CO2: 24 mmol/L (ref 22–32)
Calcium: 8 mg/dL — ABNORMAL LOW (ref 8.9–10.3)
Chloride: 103 mmol/L (ref 98–111)
Creatinine, Ser: 1.06 mg/dL — ABNORMAL HIGH (ref 0.44–1.00)
GFR, Estimated: >60 mL/min (ref >60)
Glucose, Bld: 151 mg/dL — ABNORMAL HIGH (ref 70–99)
Potassium: 3.6 mmol/L (ref 3.5–5.1)
Sodium: 133 mmol/L — ABNORMAL LOW (ref 135–145)
Total Bilirubin: 0.9 mg/dL (ref 0.3–1.2)
Total Protein: 6.7 g/dL (ref 6.5–8.1)

## 2020-01-23 LAB — CBC WITH DIFFERENTIAL/PLATELET
Abs Immature Granulocytes: 0.06 10*3/uL (ref 0.00–0.07)
Basophils Absolute: 0.1 10*3/uL (ref 0.0–0.1)
Basophils Relative: 0 %
Eosinophils Absolute: 0.1 10*3/uL (ref 0.0–0.5)
Eosinophils Relative: 1 %
HCT: 36 % (ref 36.0–46.0)
Hemoglobin: 12.2 g/dL (ref 12.0–15.0)
Immature Granulocytes: 1 %
Lymphocytes Relative: 18 %
Lymphs Abs: 2.2 10*3/uL (ref 0.7–4.0)
MCH: 29.4 pg (ref 26.0–34.0)
MCHC: 33.9 g/dL (ref 30.0–36.0)
MCV: 86.7 fL (ref 80.0–100.0)
Monocytes Absolute: 1 10*3/uL (ref 0.1–1.0)
Monocytes Relative: 8 %
Neutro Abs: 8.9 10*3/uL — ABNORMAL HIGH (ref 1.7–7.7)
Neutrophils Relative %: 72 %
Platelets: 219 10*3/uL (ref 150–400)
RBC: 4.15 MIL/uL (ref 3.87–5.11)
RDW: 14.4 % (ref 11.5–15.5)
WBC: 12.3 10*3/uL — ABNORMAL HIGH (ref 4.0–10.5)
nRBC: 0 % (ref 0.0–0.2)

## 2020-01-23 LAB — HIV ANTIBODY (ROUTINE TESTING W REFLEX): HIV Screen 4th Generation wRfx: NONREACTIVE

## 2020-01-23 LAB — GLUCOSE, CAPILLARY
Glucose-Capillary: 163 mg/dL — ABNORMAL HIGH (ref 70–99)
Glucose-Capillary: 132 mg/dL — ABNORMAL HIGH (ref 70–99)
Glucose-Capillary: 138 mg/dL — ABNORMAL HIGH (ref 70–99)

## 2020-01-23 LAB — MAGNESIUM: Magnesium: 2.2 mg/dL (ref 1.7–2.4)

## 2020-01-23 MED ORDER — CARVEDILOL 12.5 MG PO TABS
ORAL_TABLET | ORAL | Status: AC
  Administered 2020-01-23: 12.5 mg via ORAL
  Filled 2020-01-23: qty 1

## 2020-01-23 MED ORDER — INSULIN ASPART 100 UNIT/ML ~~LOC~~ SOLN
SUBCUTANEOUS | Status: AC
  Filled 2020-01-23: qty 1

## 2020-01-23 MED ORDER — OXYCODONE HCL 5 MG PO TABS: 5.0000 mg | ORAL_TABLET | ORAL | 0 refills | Status: DC | PRN

## 2020-01-23 MED ORDER — ACETAMINOPHEN 500 MG PO TABS
ORAL_TABLET | ORAL | Status: AC
  Administered 2020-01-23: 1000 mg via ORAL
  Filled 2020-01-23: qty 2

## 2020-01-23 MED ORDER — INSULIN ASPART 100 UNIT/ML ~~LOC~~ SOLN
SUBCUTANEOUS | Status: AC
  Administered 2020-01-23: 3 [IU] via SUBCUTANEOUS
  Filled 2020-01-23: qty 1

## 2020-01-23 MED ORDER — KETOROLAC TROMETHAMINE 30 MG/ML IJ SOLN
INTRAMUSCULAR | Status: AC
  Administered 2020-01-23: 30 mg via INTRAVENOUS
  Filled 2020-01-23: qty 1

## 2020-01-23 MED ORDER — IBUPROFEN 600 MG PO TABS: 600.0000 mg | ORAL_TABLET | Freq: Four times a day (QID) | ORAL | 0 refills | Status: DC | PRN

## 2020-01-23 MED ORDER — AMOXICILLIN-POT CLAVULANATE 875-125 MG PO TABS: 1.0000 | ORAL_TABLET | Freq: Two times a day (BID) | ORAL | 0 refills | Status: AC

## 2020-01-23 MED ORDER — ENOXAPARIN SODIUM 40 MG/0.4ML ~~LOC~~ SOLN
SUBCUTANEOUS | Status: AC
  Administered 2020-01-23: 40 mg via SUBCUTANEOUS
  Filled 2020-01-23: qty 0.4

## 2020-01-23 MED ORDER — LEVOTHYROXINE SODIUM 150 MCG PO TABS: 150.0000 ug | ORAL_TABLET | ORAL | Status: DC

## 2020-01-23 NOTE — Plan of Care (Signed)
Patient met and exceeded expectations for discharge home with family support.

## 2020-01-23 NOTE — Discharge Instructions (Signed)
In addition to included general post-operative instructions,  Diet: Resume home diet. Recommend limiting or avoiding fatty foods for the first few days after surgery. If you do eat these, you may (or may not) notice diarrhea. This is expected while your body is adjusting to not having a gallbladder and typically resolves with time.   Activity: No heavy lifting >20 pounds (children, pets, laundry, garbage) for 4 weeks, but light activity and walking are encouraged. Do not drive or drink alcohol if taking narcotic pain medications or having pain that might distract from driving.  Wound care: 2 days after surgery (01/15), you may shower/get incision wet with soapy water and pat dry (do not rub incisions), but no baths or submerging incision underwater until follow-up. Keep drain site covered.   Medications: Resume all home medications. For mild to moderate pain: acetaminophen (Tylenol) or ibuprofen/naproxen (if no kidney disease). Combining Tylenol with alcohol can substantially increase your risk of causing liver disease. Narcotic pain medications, if prescribed, can be used for severe pain, though may cause nausea, constipation, and drowsiness. Do not combine Tylenol and Percocet (or similar) within a 6 hour period as Percocet (and similar) contain(s) Tylenol. If you do not need the narcotic pain medication, you do not need to fill the prescription.  Call office [phone] / [phone]) at any time if any questions, worsening pain, fevers/chills, bleeding, drainage from incision site, or other concerns.

## 2020-01-23 NOTE — Discharge Summary (Signed)
[facility] SURGICAL DISCHARGE SUMMARY  Patient ID: Elizabeth Russo MRN: [id number] DOB/AGE: 60-Feb-1962 60 y.o.  Admit date: 01/22/2020 Discharge date: 01/23/2020  Discharge Diagnoses Patient Active Problem List   Diagnosis Date Noted  . S/P cholecystectomy 01/22/2020  . Calculus of gallbladder with acute cholecystitis without obstruction     Consultants None  Procedures 01/21/2019:  Robotic assisted laparoscopic cholecystectomy   HPI: Elizabeth Russo is a 60 y.o. female with a history of cholecystitis managed conservatively in the past who present to [facility] on 01/13 for schuled cholecystectomy with Dr Hampton Abbot.   Hospital Course: Informed consent was obtained and documented, and patient underwent uneventful, but challenging and long, robotic assisted laparoscopic cholecystectomy (Dr Hampton Abbot, 01/22/2020). She was admitted post-operatively given the length of operative time. Post-operatively, patient's pain improved/resolved and advancement of patient's diet and ambulation were well-tolerated. The remainder of patient's hospital course was essentially unremarkable, and discharge planning was initiated accordingly with patient safely able to be discharged home with appropriate discharge instructions, antibiotics (Augmentin x7 days), pain control, and outpatient follow-up after all of her questions were answered to her expressed satisfaction.   Discharge Condition: Good   Physical Examination:  Constitutional: well appearing female, NAD Pulmonary: Normal effort, no respiratory distress Gastrointestinal: Soft, incisional soreness, non-distended, no rebound/guarding. Surgical drain in RUQ with serosanguinous output Skin: Laparoscopic incisions are CDI with dermabond, no erythema or drainage    Allergies as of 01/23/2020      Reactions   Atorvastatin Other (See Comments)   Other reaction(s): Muscle Pain   Hydralazine Other (See Comments)   Aggravated gout Aggravated  gout   Hydrochlorothiazide Other (See Comments)   Aggravated gout   Lisinopril Cough   Pravastatin Other (See Comments)   Other reaction(s): Muscle Pain   Rosuvastatin Other (See Comments)   Other reaction(s): Muscle Pain      Medication List    TAKE these medications   amLODipine-valsartan 10-160 MG tablet Commonly known as: EXFORGE Take 1 tablet by mouth daily.   amoxicillin-clavulanate 875-125 MG tablet Commonly known as: Augmentin Take 1 tablet by mouth 2 (two) times daily for 7 days.   aspirin EC 81 MG tablet Take 81 mg by mouth daily.   carvedilol 25 MG tablet Commonly known as: COREG Take 0.5 tablets (12.5 mg total) by mouth 2 (two) times a day with meals. What changed: See the new instructions.   diclofenac sodium 1 % Gel Commonly known as: VOLTAREN Apply 2 g topically daily as needed (pain).   ezetimibe 10 MG tablet Commonly known as: ZETIA Take 1 tablet (10 mg total) by mouth daily. What changed: See the new instructions.   famotidine 20 MG tablet Commonly known as: PEPCID Take 1 tablet (20 mg total) by mouth 2 (two) times daily.   fluticasone 50 MCG/ACT nasal spray Commonly known as: FLONASE Place 2 sprays into both nostrils daily.   furosemide 20 MG tablet Commonly known as: LASIX Take when needed for Edema. What changed:   how much to take  how to take this  when to take this  reasons to take this   glucose blood test strip Use as instructed   HYDROcodone-acetaminophen 5-325 MG tablet Commonly known as: NORCO/VICODIN Take 0.5-1 tablets by mouth 3 (three) times daily as needed for severe pain.   ibuprofen 600 MG tablet Commonly known as: ADVIL Take 1 tablet (600 mg total) by mouth every 6 (six) hours as needed.   Insulin Pen Needle 32G X 4 MM Misc  Commonly known as: BD Pen Needle Nano U/F Use for insulin administration four times daily.   levothyroxine 137 MCG tablet Commonly known as: SYNTHROID Take 1 tablet by mouth daily  before breakfast on Tuesday, Thursday, Saturday and Sunday What changed: See the new instructions.   levothyroxine 150 MCG tablet Commonly known as: SYNTHROID Take 1 tablet by mouth once daily on Monday, Wednesday and Friday and take 137 mcg on the other days. What changed: See the new instructions.   meclizine 25 MG tablet Commonly known as: ANTIVERT Take 1 tablet (25 mg total) by mouth Three (3) times a day as needed for dizziness. What changed: See the new instructions.   metFORMIN 500 MG 24 hr tablet Commonly known as: GLUCOPHAGE-XR Take 1 tablet (500 mg total) by mouth daily with breakfast. What changed: See the new instructions.   metoCLOPramide 10 MG tablet Commonly known as: Reglan Take 1 tablet (10 mg total) by mouth 3 (three) times daily as needed for nausea (headache / nausea).   ondansetron 4 MG disintegrating tablet Commonly known as: Zofran ODT Take 1-2 tablets (4-8 mg total) by mouth every 8 (eight) hours as needed for nausea or vomiting.   oxyCODONE 5 MG immediate release tablet Commonly known as: Oxy IR/ROXICODONE Take 1-2 tablets (5-10 mg total) by mouth every 4 (four) hours as needed for moderate pain.   pantoprazole 40 MG tablet Commonly known as: Protonix Take 1 tablet (40 mg total) by mouth daily.   Praluent 75 MG/ML Soaj Generic drug: Alirocumab Inject 75 mg into the skin every 14 (fourteen) days.   Victoza 18 MG/3ML Sopn Generic drug: liraglutide Inject 1.2 mg into the skin daily. What changed: See the new instructions.         Follow-up Information    Olean Ree, MD. Schedule an appointment as soon as possible for a visit on 01/26/2020.   Specialty: General Surgery Why: follow up next week, s/p lap cholecystectomy, has drain  Contact information: [address] [place] [address] [phone]                Time spent on discharge management including discussion of hospital course, clinical condition,  outpatient instructions, prescriptions, and follow up with the patient and members of the medical team: >30 minutes  -- Edison Simon , PA-C [facility]  01/23/2020, 8:38 AM [phone] M-F: 7am - 4pm

## 2020-01-28 ENCOUNTER — Ambulatory Visit (INDEPENDENT_AMBULATORY_CARE_PROVIDER_SITE_OTHER): Payer: BC Managed Care – PPO | Admitting: Surgery

## 2020-01-28 ENCOUNTER — Other Ambulatory Visit: Payer: Self-pay

## 2020-01-28 ENCOUNTER — Encounter: Payer: Self-pay | Admitting: Surgery

## 2020-01-28 VITALS — BP 144/78 | HR 62 | Temp 98.2°F | Ht 64.0 in | Wt 255.0 lb

## 2020-01-28 DIAGNOSIS — Z09 Encounter for follow-up examination after completed treatment for conditions other than malignant neoplasm: Secondary | ICD-10-CM

## 2020-01-28 DIAGNOSIS — K8000 Calculus of gallbladder with acute cholecystitis without obstruction: Secondary | ICD-10-CM

## 2020-01-28 NOTE — Progress Notes (Signed)
FMLA medical certificate completed and has been faxed to [facility].

## 2020-01-28 NOTE — Progress Notes (Signed)
01/28/2020  HPI: Elizabeth Russo is a 60 y.o. female s/p robotic assisted cholecystectomy on 01/22/20.  Her gallbladder was very inflamed full of small stones and purulent fluid, and there were significant adhesions from the omentum onto the gallbladder.  A drain was left in place and she was discharged on 1/14 after overnight observation.  Today, she reports she's doing well.  There is still soreness in the RUQ but has been improving.  She's taking her antibiotic without side effects.  Her drain has been having low output.  Vital signs: BP (!) 144/78   Pulse 62   Temp 98.2 F (36.8 C)   Ht 5\' 4"  (1.626 m)   Wt 255 lb (115.7 kg)   LMP  (LMP Unknown)   SpO2 97%   BMI 43.77 kg/m    Physical Exam: Constitutional: No acute distress Abdomen:  Soft, non-distended, appropriately tender to palpation.  Incisions are clean, dry, intact.  Blake drain with serosanguinous fluid.  Assessment/Plan: This is a 60 y.o. female s/p robotic assisted cholecystectomy.  --Removed drain today without complications.  Dry gauze dressing applied and instructed on daily dressing change until drain site fully healed. --Continue antibiotics until course completed. --OK to return to work on 02/09/20 with lighter duties.  No heavy pushing/lifting restrictions for 4 weeks total to end on 02/19/20 --Follow up in 2 weeks.   Melvyn Neth, [facility]

## 2020-02-03 ENCOUNTER — Encounter: Payer: Self-pay | Admitting: Surgery

## 2020-02-05 ENCOUNTER — Other Ambulatory Visit: Payer: Self-pay | Admitting: Family Medicine

## 2020-02-05 MED ORDER — METFORMIN HCL ER 500 MG PO TB24: 500.0000 mg | ORAL_TABLET | Freq: Every day | ORAL | 1 refills | Status: DC

## 2020-02-05 NOTE — Telephone Encounter (Signed)
Please see previous encounter

## 2020-02-05 NOTE — Telephone Encounter (Signed)
Copied from [place] [id number]. Topic: Quick Communication - Rx Refill/Question >> Feb 05, 2020  8:45 AM Leward Quan A wrote: Medication: fluconazole (DIFLUCAN) 150 MG tablet, metFORMIN (GLUCOPHAGE-XR) 500 MG 24 hr tablet  Patient would like Rx sent today all out on antibiotics again Has the patient contacted their pharmacy? Yes.   (Agent: If no, request that the patient contact the pharmacy for the refill.) (Agent: If yes, when and what did the pharmacy advise?)  Preferred Pharmacy (with phone number or street name): [place], [place]  Phone:  [phone] Fax:  [phone]     Agent: Please be advised that RX refills may take up to 3 business days. We ask that you follow-up with your pharmacy.

## 2020-02-11 ENCOUNTER — Encounter: Payer: Self-pay | Admitting: Surgery

## 2020-02-11 ENCOUNTER — Ambulatory Visit (INDEPENDENT_AMBULATORY_CARE_PROVIDER_SITE_OTHER): Payer: BC Managed Care – PPO | Admitting: Surgery

## 2020-02-11 ENCOUNTER — Other Ambulatory Visit: Payer: Self-pay

## 2020-02-11 VITALS — BP 152/88 | HR 58 | Temp 98.2°F | Ht 64.0 in | Wt 253.0 lb

## 2020-02-11 DIAGNOSIS — K8000 Calculus of gallbladder with acute cholecystitis without obstruction: Secondary | ICD-10-CM

## 2020-02-11 DIAGNOSIS — Z09 Encounter for follow-up examination after completed treatment for conditions other than malignant neoplasm: Secondary | ICD-10-CM

## 2020-02-11 NOTE — Progress Notes (Signed)
02/11/2020  HPI: Elizabeth Russo is a 60 y.o. female s/p robotic assisted cholecystectomy with ICG cholangiogram on 01/22/20.  Drain was left in place due to significant inflammation and use of stapler to go across the neck of the gallbladder.  Drain was removed on 1/19 without issues.  Today reports that she has nausea in the morning, about once daily, associated with increased burping.  Denies any nausea with po intake, and reports that she's eating well, though has a decreased appetite.  Normal bowel function.  Vital signs: BP (!) 152/88   Pulse (!) 58   Temp 98.2 F (36.8 C) (Oral)   Ht 5\' 4"  (1.626 m)   Wt 253 lb (114.8 kg)   LMP  (LMP Unknown)   SpO2 97%   BMI 43.43 kg/m    Physical Exam: Constitutional: No acute distress Abdomen:  Soft, non-distended, appropriately sore to palpation.  Incisions are healing well, clean, dry, intact.  Assessment/Plan: This is a 60 y.o. female s/p robotic assisted cholecystectomy.  --Discussed with the patient that her symptoms could be related to her body still adjusting to not having a gallbladder.  This could potentially also be related to GERD symptoms, but she mentioned that her antiacid medication did not seem to help the symptoms. --Advised the patient that if her symptoms continue in a month, to call us so we can evaluate her and determine if any imaging or medications are needed. --Follow up prn.   Melvyn Neth, [facility]

## 2020-02-11 NOTE — Patient Instructions (Signed)
Please call with any questions or concerns. You may resume your normal activities on  02/19/20.

## 2020-02-27 ENCOUNTER — Encounter: Payer: Self-pay | Admitting: Emergency Medicine

## 2020-02-27 ENCOUNTER — Ambulatory Visit
Admission: EM | Admit: 2020-02-27 | Discharge: 2020-02-27 | Disposition: A | Discharge status: Home / Self Care | Payer: BC Managed Care – PPO | Attending: Emergency Medicine | Admitting: Emergency Medicine

## 2020-02-27 ENCOUNTER — Other Ambulatory Visit: Payer: Self-pay

## 2020-02-27 DIAGNOSIS — R21 Rash and other nonspecific skin eruption: Secondary | ICD-10-CM | POA: Diagnosis not present

## 2020-02-27 MED ORDER — CLOTRIMAZOLE-BETAMETHASONE 1-0.05 % EX CREA: TOPICAL_CREAM | CUTANEOUS | 0 refills | Status: DC

## 2020-02-27 NOTE — ED Provider Notes (Signed)
Elizabeth Russo    CSN: [id number] Arrival date & time: 02/27/20  0801      History   Chief Complaint Chief Complaint  Patient presents with  . Rash    Folds of abdomen    HPI Elizabeth Russo is a 60 y.o. female.   The history is provided by the patient.  Rash Location:  Torso Torso rash location:  Abd LLQ and abd RLQ Quality: itchiness and scaling   Onset quality:  Sudden Duration:  2 weeks Progression:  Partially resolved Chronicity:  New Relieved by:  Anti-itch cream Worsened by:  Moisture Ineffective treatments:  Moisturizers Associated symptoms: no abdominal pain, no fever and no induration     Past Medical History:  Diagnosis Date  . (HFpEF) heart failure with preserved ejection fraction ([place])   . Atrial fibrillation ([place]) 02/13/2011  . Breast mass, right 04/25/2014  . CAD (coronary artery disease)   . Diabetes mellitus without complication ([place])   . Diverticulosis   . Family history of colon cancer in father   . Goiter 03/19/2014  . Hearing loss 08/25/2011  . HLD (hyperlipidemia)   . Hypertension   . Hypothyroid   . Localized enlarged lymph nodes 10/23/2014   cervical   . Low back pain 12/25/2014  . Morbid obesity ([place]) 06/26/2014  . Myalgia due to statin 04/13/2017   Intolerant to three different statins  . NSTEMI (non-ST elevated myocardial infarction) ([place]) 01/2011   Subendocardial  . Personal history of colonic polyps   . Vertigo     Patient Active Problem List   Diagnosis Date Noted  . S/P cholecystectomy 01/22/2020  . Calculus of gallbladder with acute cholecystitis without obstruction   . Statin intolerance 03/31/2019  . Heart failure with preserved left ventricular function (HFpEF) ([place]) 03/31/2019  . Atrial paroxysmal tachycardia ([place]) 03/31/2019  . 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 with body mass index (BMI) of 45.0 to 49.9 in adult [facility]) 03/31/2019  . Urinary incontinence 03/31/2019  . Myalgia due to statin  04/13/2017  . Venous stasis 04/13/2017  . History of non-ST elevation myocardial infarction (NSTEMI) 02/05/2015  . Hx of percutaneous transluminal coronary angioplasty 02/05/2015  . Gout of knee 12/25/2014  . Chronic combined systolic and diastolic heart failure ([place]) 05/24/2014  . Coronary atherosclerosis 12/19/2013  . Hyperlipidemia 12/19/2013  . Essential hypertension 12/18/2013  . Diabetes mellitus type 2 in obese ([place]) 12/18/2013  . Hypothyroidism 12/18/2013  . Dyslipidemia 02/13/2011  . Tobacco use disorder 02/13/2011    Past Surgical History:  Procedure Laterality Date  . ABDOMINAL HYSTERECTOMY    . CARDIAC CATHETERIZATION    . CHOLECYSTECTOMY    . COLONOSCOPY WITH PROPOFOL N/A 08/13/2017   Procedure: COLONOSCOPY WITH PROPOFOL;  Surgeon: Lin Landsman, MD;  Location: [facility] ENDOSCOPY;  Service: Gastroenterology;  Laterality: N/A;  . heart stent  2012    OB History    Gravida  2   Para      Term      Preterm      AB      Living  2     SAB      IAB      Ectopic      Multiple      Live Births           Obstetric Comments  1st Menstrual Cycle:  11 1st Pregnancy:  22          Home Medications    Prior to  Admission medications   Medication Sig Start Date End Date Taking? Authorizing Provider  clotrimazole-betamethasone (LOTRISONE) cream Apply to affected area 2 times daily prn 02/27/20  Yes Pearson Forster, NP  Alirocumab (PRALUENT) 75 MG/ML SOAJ Inject 75 mg into the skin every 14 (fourteen) days. 11/27/19   Kate Sable, MD  amLODipine-valsartan (EXFORGE) 10-160 MG tablet Take 1 tablet by mouth daily. 10/14/19   Delsa Grana, PA-C  aspirin EC 81 MG tablet Take 81 mg by mouth daily.     [provider]  carvedilol (COREG) 25 MG tablet Take 0.5 tablets (12.5 mg total) by mouth 2 (two) times a day with meals. Patient taking differently: Take 12.5 mg by mouth 2 (two) times daily with a meal. 12/25/19   Delsa Grana, PA-C  diclofenac  sodium (VOLTAREN) 1 % GEL Apply 2 g topically daily as needed (pain). 06/09/15   [provider]  ezetimibe (ZETIA) 10 MG tablet Take 1 tablet (10 mg total) by mouth daily. Patient taking differently: Take 10 mg by mouth daily. 07/02/19   Delsa Grana, PA-C  famotidine (PEPCID) 20 MG tablet Take 1 tablet (20 mg total) by mouth 2 (two) times daily. 02/11/19   Delsa Grana, PA-C  fluticasone (FLONASE) 50 MCG/ACT nasal spray Place 2 sprays into both nostrils daily. 01/08/20   Delsa Grana, PA-C  furosemide (LASIX) 20 MG tablet Take when needed for Edema. Patient taking differently: Take 20 mg by mouth daily as needed for edema. Take when needed for Edema. 11/27/19   Kate Sable, MD  glucose blood test strip Use as instructed 09/12/18   Poulose, Bethel Born, NP  HYDROcodone-acetaminophen (NORCO/VICODIN) 5-325 MG tablet Take 0.5-1 tablets by mouth 3 (three) times daily as needed for severe pain. 12/03/19   Delsa Grana, PA-C  ibuprofen (ADVIL) 600 MG tablet Take 1 tablet (600 mg total) by mouth every 6 (six) hours as needed. 01/23/20   Tylene Fantasia, PA-C  Insulin Pen Needle (BD PEN NEEDLE NANO U/F) 32G X 4 MM MISC Use for insulin administration four times daily. 01/27/15   Arnetha Courser, MD  levothyroxine (SYNTHROID) 137 MCG tablet Take 1 tablet by mouth daily before breakfast on Tuesday, Thursday, Saturday and Sunday Patient taking differently: Take 137 mcg by mouth See admin instructions. Take 1 tablet by mouth daily before breakfast on Tuesday, Thursday, Saturday and Sunday 04/03/19   Delsa Grana, PA-C  levothyroxine (SYNTHROID) 150 MCG tablet Take 1 tablet by mouth once daily on Monday, Wednesday and Friday and take 137 mcg on the other days. Patient taking differently: Take 150 mcg by mouth every Monday, Wednesday, and Friday. 07/29/19   Delsa Grana, PA-C  meclizine (ANTIVERT) 25 MG tablet Take 1 tablet (25 mg total) by mouth Three (3) times a day as needed for dizziness. Patient  taking differently: Take 25 mg by mouth 3 (three) times daily as needed for dizziness. 10/24/19   Delsa Grana, PA-C  metFORMIN (GLUCOPHAGE-XR) 500 MG 24 hr tablet Take 1 tablet (500 mg total) by mouth daily with breakfast. 02/05/20   Delsa Grana, PA-C  metoCLOPramide (REGLAN) 10 MG tablet Take 1 tablet (10 mg total) by mouth 3 (three) times daily as needed for nausea (headache / nausea). 12/03/19   Delsa Grana, PA-C  ondansetron (ZOFRAN ODT) 4 MG disintegrating tablet Take 1-2 tablets (4-8 mg total) by mouth every 8 (eight) hours as needed for nausea or vomiting. 12/03/19   Delsa Grana, PA-C  pantoprazole (PROTONIX) 40 MG tablet Take 1 tablet (40  mg total) by mouth daily. 12/03/19   Delsa Grana, PA-C  VICTOZA 18 MG/3ML SOPN Inject 1.2 mg into the skin daily. Patient taking differently: Inject 1.2 mg into the skin at bedtime. 12/08/19   Delsa Grana, PA-C    Family History Family History  Problem Relation Age of Onset  . Heart disease Mother   . Diabetes Mother   . Hypertension Mother   . Hyperlipidemia Mother   . Thyroid disease Mother   . Cancer Father        colon  . Diabetes Father   . Hypertension Father   . Diabetes Sister   . Hypertension Sister   . Heart disease Sister   . Hyperlipidemia Sister   . Kidney disease Sister   . Cancer Maternal Grandfather        lung  . Heart attack Paternal Grandfather   . Heart disease Sister   . Diabetes Sister   . Heart disease Sister   . Arthritis Sister     Social History Social History   Tobacco Use  . Smoking status: Former Smoker    Packs/day: 0.50    Years: 30.00    Pack years: 15.00    Types: Cigarettes    Start date: 01/09/1978    Quit date: 03/09/2013    Years since quitting: 6.9  . Smokeless tobacco: Never Used  Vaping Use  . Vaping Use: Never used  Substance Use Topics  . Alcohol use: Yes    Alcohol/week: 0.0 standard drinks    Comment: Socially   . Drug use: No     Allergies   Atorvastatin, Hydralazine,  Hydrochlorothiazide, Lisinopril, Pravastatin, and Rosuvastatin   Review of Systems Review of Systems  Constitutional: Negative for fever.  Gastrointestinal: Negative for abdominal pain.  Skin: Positive for rash.     Physical Exam Triage Vital Signs ED Triage Vitals  Enc Vitals Group     BP 02/27/20 0809 127/85     Pulse Rate 02/27/20 0809 67     Resp 02/27/20 0809 18     Temp 02/27/20 0809 98.2 F (36.8 C)     Temp Source 02/27/20 0809 Oral     SpO2 02/27/20 0809 96 %     Weight --      Height --      Head Circumference --      Peak Flow --      Pain Score 02/27/20 0812 0     Pain Loc --      Pain Edu? --      Excl. in [place]? --    No data found.  Updated Vital Signs BP 127/85 (BP Location: Left Arm)   Pulse 67   Temp 98.2 F (36.8 C) (Oral)   Resp 18   LMP  (LMP Unknown)   SpO2 96%   Visual Acuity Right Eye Distance:   Left Eye Distance:   Bilateral Distance:    Right Eye Near:   Left Eye Near:    Bilateral Near:     Physical Exam Constitutional:      General: She is not in acute distress.    Appearance: She is not ill-appearing or diaphoretic.  Eyes:     Conjunctiva/sclera: Conjunctivae normal.  Cardiovascular:     Pulses: Normal pulses.  Pulmonary:     Effort: Pulmonary effort is normal.  Musculoskeletal:        General: Normal range of motion.     Cervical back: Normal range of motion.  Skin:  General: Skin is warm.     Findings: Erythema and rash present. Rash is scaling.       Neurological:     Mental Status: She is alert.  Psychiatric:        Mood and Affect: Mood normal.      UC Treatments / Results  Labs (all labs ordered are listed, but only abnormal results are displayed) Labs Reviewed - No data to display  EKG   Radiology No results found.  Procedures Procedures (including critical care time)  Medications Ordered in UC Medications - No data to display  Initial Impression / Assessment and Plan / UC Course  I have  reviewed the triage vital signs and the nursing notes.  Pertinent labs & imaging results that were available during my care of the patient were reviewed by me and considered in my medical decision making (see chart for details).     Fungal infection of the panus.  Assessment negative for other concerns or red flags.   Patient may continue to use powder for symptoms relief.  Prescription for Lotrisone cream given.  Patient instructed to keep area dry and avoid tight fitting clothing.   Final Clinical Impressions(s) / UC Diagnoses   Final diagnoses:  None     Discharge Instructions     You have a yeast function.  Apply cream to area twice a day until symptoms resolve.  You may continue to use powder as well. Keep infected area dry and avoid tight fitting clothing.    Follow up if symptoms worsen or do not improve in a few days.     ED Prescriptions    Medication Sig Dispense Auth. Provider   clotrimazole-betamethasone (LOTRISONE) cream Apply to affected area 2 times daily prn 15 g Pearson Forster, NP     PDMP not reviewed this encounter.   Pearson Forster, NP 02/27/20 [phone]

## 2020-02-27 NOTE — ED Triage Notes (Signed)
Pt presents today with rash in folds of abdomen, she does report recently finishing antibiotic s/p gallbladder surgery. +itching

## 2020-02-27 NOTE — Discharge Instructions (Addendum)
You have a yeast function.  Apply cream to area twice a day until symptoms resolve.  You may continue to use powder as well. Keep infected area dry and avoid tight fitting clothing.    Follow up if symptoms worsen or do not improve in a few days.

## 2020-03-11 ENCOUNTER — Other Ambulatory Visit: Payer: Self-pay | Admitting: Family Medicine

## 2020-03-12 ENCOUNTER — Other Ambulatory Visit: Payer: Self-pay

## 2020-03-12 DIAGNOSIS — E785 Hyperlipidemia, unspecified: Secondary | ICD-10-CM

## 2020-03-12 MED ORDER — PRALUENT 75 MG/ML ~~LOC~~ SOAJ: 75.0000 mg | SUBCUTANEOUS | 3 refills | Status: DC

## 2020-03-23 ENCOUNTER — Other Ambulatory Visit: Payer: Self-pay | Admitting: Family Medicine

## 2020-03-23 DIAGNOSIS — E119 Type 2 diabetes mellitus without complications: Secondary | ICD-10-CM

## 2020-03-29 ENCOUNTER — Telehealth: Payer: Self-pay | Admitting: Family Medicine

## 2020-03-30 NOTE — Telephone Encounter (Signed)
Pt has an appt on 04/09/20

## 2020-04-09 ENCOUNTER — Ambulatory Visit (INDEPENDENT_AMBULATORY_CARE_PROVIDER_SITE_OTHER): Payer: BC Managed Care – PPO | Admitting: Family Medicine

## 2020-04-09 ENCOUNTER — Encounter: Payer: Self-pay | Admitting: Family Medicine

## 2020-04-09 ENCOUNTER — Other Ambulatory Visit: Payer: Self-pay

## 2020-04-09 VITALS — BP 130/82 | HR 67 | Temp 98.1°F | Resp 16 | Ht 64.0 in | Wt 253.0 lb

## 2020-04-09 DIAGNOSIS — E1169 Type 2 diabetes mellitus with other specified complication: Secondary | ICD-10-CM

## 2020-04-09 DIAGNOSIS — I10 Essential (primary) hypertension: Secondary | ICD-10-CM | POA: Diagnosis not present

## 2020-04-09 DIAGNOSIS — Z5181 Encounter for therapeutic drug level monitoring: Secondary | ICD-10-CM

## 2020-04-09 DIAGNOSIS — I5030 Unspecified diastolic (congestive) heart failure: Secondary | ICD-10-CM

## 2020-04-09 DIAGNOSIS — R0600 Dyspnea, unspecified: Secondary | ICD-10-CM

## 2020-04-09 DIAGNOSIS — Z23 Encounter for immunization: Secondary | ICD-10-CM | POA: Diagnosis not present

## 2020-04-09 DIAGNOSIS — E039 Hypothyroidism, unspecified: Secondary | ICD-10-CM

## 2020-04-09 DIAGNOSIS — R0609 Other forms of dyspnea: Secondary | ICD-10-CM

## 2020-04-09 DIAGNOSIS — E661 Drug-induced obesity: Secondary | ICD-10-CM

## 2020-04-09 DIAGNOSIS — I2510 Atherosclerotic heart disease of native coronary artery without angina pectoris: Secondary | ICD-10-CM

## 2020-04-09 DIAGNOSIS — B372 Candidiasis of skin and nail: Secondary | ICD-10-CM

## 2020-04-09 DIAGNOSIS — K219 Gastro-esophageal reflux disease without esophagitis: Secondary | ICD-10-CM

## 2020-04-09 DIAGNOSIS — E782 Mixed hyperlipidemia: Secondary | ICD-10-CM

## 2020-04-09 DIAGNOSIS — I471 Supraventricular tachycardia: Secondary | ICD-10-CM

## 2020-04-09 DIAGNOSIS — E669 Obesity, unspecified: Secondary | ICD-10-CM

## 2020-04-09 DIAGNOSIS — Z6841 Body Mass Index (BMI) 40.0 and over, adult: Secondary | ICD-10-CM

## 2020-04-09 MED ORDER — FAMOTIDINE 20 MG PO TABS: 20.0000 mg | ORAL_TABLET | Freq: Two times a day (BID) | ORAL | 3 refills | Status: DC

## 2020-04-09 MED ORDER — CLOTRIMAZOLE-BETAMETHASONE 1-0.05 % EX CREA: TOPICAL_CREAM | CUTANEOUS | 3 refills | Status: DC

## 2020-04-09 NOTE — Patient Instructions (Addendum)
Ask insurance if there are other covered med options for managing your Diabetes:   GLP-1  Choice of therapy--When a decision has been made to use a GLP-1 receptor agonist, exenatide (two daily injections or one weekly injection), lixisenatide (once-daily injection), liraglutide - Victoza (once-daily injection), dulaglutide - Trulicity (once-weekly injection), or semaglutide - Ozempic  (once-weekly injection or once-daily oral tablet) are available options.    Health Maintenance  Topic Date Due  . Complete foot exam   03/30/2020  . Eye exam for diabetics  05/19/2020  . Hemoglobin A1C  07/08/2020  . Flu Shot  08/09/2020  . Mammogram  03/20/2021  . Colon Cancer Screening  08/14/2022  . Tetanus Vaccine  04/10/2030  . Pneumococcal vaccine  Completed  . COVID-19 Vaccine  Completed  .  Hepatitis C: One time screening is recommended by Center for Disease Control  (CDC) for  adults born from [birth year] through 1965.   Completed  . HIV Screening  Completed  . HPV Vaccine  Aged Out

## 2020-04-09 NOTE — Progress Notes (Signed)
Name: Elizabeth Russo   MRN: [id number]    DOB: November 02, 1960   Date:04/09/2020       Progress Note  Chief Complaint  Patient presents with  . Diabetes  . Hypothyroidism  . Hyperlipidemia     Subjective:   Elizabeth Russo is a 60 y.o. female, presents to clinic for routine f/up   DM- hx of being well controlled, dx more than 5 years ago Metformin 500 mg xr once daily - no sensitivity to it, never tried higher dose victoza 1.2 mg dose daily - interested in changing this med Checks sugars occasionally last night was 140's no highs or lows to report Denies: Polyuria, polydipsia, vision changes, neuropathy, hypoglycemia Recent pertinent labs: Lab Results  Component Value Date   HGBA1C 6.0 (A) 01/08/2020   HGBA1C 6.1 (H) 10/10/2019   HGBA1C 6.3 (H) 03/31/2019   Standard of care and health maintenance: ACEI/ARB: yes Statin:  Intolerant - on injectable per cardiology  HTN, atrial tachycardia, hx of NSTEMI, CAD, CHF - no exertional CP She sees cardiology regularly On coreg 12.5 mg BID - no palpitations, near syncope - feels HR increase if she gets upset or anxious but no palpitations otherwise On amlodipine-valsartan and asa BP well controlled, not monitoring at home BP Readings from Last 3 Encounters:  04/09/20 130/82  02/27/20 127/85  02/11/20 (!) 152/88  CHF - not using lasix hardly at all, weight stable, no orthopnea, PND, LE edema She has some DOE but just feels like she has to breath harder and is out of shape, no distress, near syncope - she doesn't have to rest and she has no associated diaphoresis, HA, vision changes, CP  GERD- well controlled pepcid bid  Intertrigo got cream from UC and wants a refill - clotirmazole - betamethasone- works well in groin and breast skin folds when rash occurs  Former smoker for more than 20 years - no hx of asthma or COPD, doesn't get bronchitis, no chronic cough, no hx of pneumonia  DOE - worse than she would expect - walking for  2-3 min she will start feeling like she's breathing heavy Can go up a few flights of stairs with SOB but not distress no having to rest   Hypothyroidism: Current Medication Regimen: 137 mcg 4d a week and 150 mcg 3 d a week-  Levothyroxine  Takes medicine as prescribed Current Symptoms: denies fatigue, weight changes, heat/cold intolerance, bowel/skin changes or CVS symptoms Most recent results are below; we will be repeating labs today. Lab Results  Component Value Date   TSH 3.28 10/10/2019      Current Outpatient Medications:  .  Alirocumab (PRALUENT) 75 MG/ML SOAJ, Inject 75 mg into the skin every 14 (fourteen) days., Disp: 6 mL, Rfl: 3 .  amLODipine-valsartan (EXFORGE) 10-160 MG tablet, Take 1 tablet by mouth daily., Disp: 90 tablet, Rfl: 3 .  aspirin EC 81 MG tablet, Take 81 mg by mouth daily. , Disp: , Rfl:  .  carvedilol (COREG) 25 MG tablet, Take 0.5 tablets (12.5 mg total) by mouth 2 (two) times a day with meals. (Patient taking differently: Take 12.5 mg by mouth 2 (two) times daily with a meal.), Disp: 90 tablet, Rfl: 1 .  clotrimazole-betamethasone (LOTRISONE) cream, Apply to affected area 2 times daily prn, Disp: 15 g, Rfl: 0 .  ezetimibe (ZETIA) 10 MG tablet, Take 1 tablet (10 mg total) by mouth daily. (Patient taking differently: Take 10 mg by mouth daily.), Disp: 30 tablet, Rfl:  11 .  famotidine (PEPCID) 20 MG tablet, Take 1 tablet (20 mg total) by mouth 2 (two) times daily., Disp: 180 tablet, Rfl: 1 .  fluticasone (FLONASE) 50 MCG/ACT nasal spray, Place 2 sprays into both nostrils daily., Disp: 16 g, Rfl: 2 .  levothyroxine (SYNTHROID) 137 MCG tablet, Take 1 tablet (137 mcg total) by mouth before breakfast on Tuesdays, Thursdays, Saturdays, and Sundays., Disp: 48 tablet, Rfl: 0 .  levothyroxine (SYNTHROID) 150 MCG tablet, Take 1 tablet by mouth once daily on Monday, Wednesday and Friday and take 137 mcg on the other days. (Patient taking differently: Take 150 mcg by mouth  every Monday, Wednesday, and Friday.), Disp: 78 tablet, Rfl: 1 .  metFORMIN (GLUCOPHAGE-XR) 500 MG 24 hr tablet, Take 1 tablet (500 mg total) by mouth daily with breakfast., Disp: 90 tablet, Rfl: 1 .  VICTOZA 18 MG/3ML SOPN, Inject 1.2 mg into the skin daily., Disp: 6 mL, Rfl: 2 .  diclofenac sodium (VOLTAREN) 1 % GEL, Apply 2 g topically daily as needed (pain). (Patient not taking: Reported on 04/09/2020), Disp: , Rfl:  .  furosemide (LASIX) 20 MG tablet, Take when needed for Edema. (Patient not taking: Reported on 04/09/2020), Disp: 90 tablet, Rfl: 3 .  glucose blood test strip, Test tid (Patient not taking: Reported on 04/09/2020), Disp: 100 each, Rfl: 12 .  ibuprofen (ADVIL) 600 MG tablet, Take 1 tablet (600 mg total) by mouth every 6 (six) hours as needed. (Patient not taking: Reported on 04/09/2020), Disp: 30 tablet, Rfl: 0 .  Insulin Pen Needle 32G X 4 MM MISC, Use as directed (Patient not taking: Reported on 04/09/2020), Disp: 100 each, Rfl: PRN .  meclizine (ANTIVERT) 25 MG tablet, Take 1 tablet (25 mg total) by mouth Three (3) times a day as needed for dizziness. (Patient not taking: Reported on 04/09/2020), Disp: 30 tablet, Rfl: 1 .  ondansetron (ZOFRAN ODT) 4 MG disintegrating tablet, Take 1-2 tablets (4-8 mg total) by mouth every 8 (eight) hours as needed for nausea or vomiting. (Patient not taking: No sig reported), Disp: 20 tablet, Rfl: 1  Patient Active Problem List   Diagnosis Date Noted  . S/P cholecystectomy 01/22/2020  . Calculus of gallbladder with acute cholecystitis without obstruction   . Statin intolerance 03/31/2019  . Heart failure with preserved left ventricular function (HFpEF) ([place]) 03/31/2019  . Atrial paroxysmal tachycardia ([place]) 03/31/2019  . 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 Urinary incontinence 03/31/2019  . Myalgia due to statin 04/13/2017  . Venous stasis 04/13/2017  . History  of non-ST elevation myocardial infarction (NSTEMI) 02/05/2015  . Hx of percutaneous transluminal coronary angioplasty 02/05/2015  . Gout of knee 12/25/2014  . Chronic combined systolic and diastolic heart failure ([place]) 05/24/2014  . Coronary atherosclerosis 12/19/2013  . Hyperlipidemia 12/19/2013  . Essential hypertension 12/18/2013  . Diabetes mellitus type 2 in obese ([place]) 12/18/2013  . Hypothyroidism 12/18/2013  . Dyslipidemia 02/13/2011  . Tobacco use disorder 02/13/2011    Past Surgical History:  Procedure Laterality Date  . ABDOMINAL HYSTERECTOMY    . CARDIAC CATHETERIZATION    . CHOLECYSTECTOMY    . COLONOSCOPY WITH PROPOFOL N/A 08/13/2017   Procedure: COLONOSCOPY WITH PROPOFOL;  Surgeon: Lin Landsman, MD;  Location: [facility] ENDOSCOPY;  Service: Gastroenterology;  Laterality: N/A;  . heart stent  2012    Family History  Problem Relation Age of Onset  . Heart disease  Mother   . Diabetes Mother   . Hypertension Mother   . Hyperlipidemia Mother   . Thyroid disease Mother   . Cancer Father        colon  . Diabetes Father   . Hypertension Father   . Diabetes Sister   . Hypertension Sister   . Heart disease Sister   . Hyperlipidemia Sister   . Kidney disease Sister   . Cancer Maternal Grandfather        lung  . Heart attack Paternal Grandfather   . Heart disease Sister   . Diabetes Sister   . Heart disease Sister   . Arthritis Sister     Social History   Tobacco Use  . Smoking status: Former Smoker    Packs/day: 0.50    Years: 30.00    Pack years: 15.00    Types: Cigarettes    Start date: 01/09/1978    Quit date: 03/09/2013    Years since quitting: 7.0  . Smokeless tobacco: Never Used  Vaping Use  . Vaping Use: Never used  Substance Use Topics  . Alcohol use: Yes    Alcohol/week: 0.0 standard drinks    Comment: Socially   . Drug use: No     Allergies  Allergen Reactions  . Atorvastatin Other (See Comments)    Other reaction(s): Muscle Pain  .  Hydralazine Other (See Comments)    Aggravated gout Aggravated gout  . Hydrochlorothiazide Other (See Comments)    Aggravated gout  . Lisinopril Cough  . Pravastatin Other (See Comments)    Other reaction(s): Muscle Pain  . Rosuvastatin Other (See Comments)    Other reaction(s): Muscle Pain    Health Maintenance  Topic Date Due  . FOOT EXAM  03/30/2020  . OPHTHALMOLOGY EXAM  05/19/2020  . HEMOGLOBIN A1C  07/08/2020  . INFLUENZA VACCINE  08/09/2020  . MAMMOGRAM  03/20/2021  . COLONOSCOPY (Pts 45-68yrs Insurance coverage will need to be confirmed)  08/14/2022  . TETANUS/TDAP  04/10/2030  . PNEUMOCOCCAL POLYSACCHARIDE VACCINE AGE 83-64 HIGH RISK  Completed  . COVID-19 Vaccine  Completed  . Hepatitis C Screening  Completed  . HIV Screening  Completed  . HPV VACCINES  Aged Out    Chart Review Today: I personally reviewed active problem list, medication list, allergies, family history, social history, health maintenance, notes from last encounter, lab results, imaging with the patient/caregiver today.   Review of Systems  Constitutional: Negative.   HENT: Negative.   Eyes: Negative.   Respiratory: Negative.   Cardiovascular: Negative.   Gastrointestinal: Negative.   Endocrine: Negative.   Genitourinary: Negative.   Musculoskeletal: Negative.   Skin: Negative.   Allergic/Immunologic: Negative.   Neurological: Negative.   Hematological: Negative.   Psychiatric/Behavioral: Negative.   All other systems reviewed and are negative.       Objective:   Vitals:   04/09/20 0827  BP: 130/82  Pulse: 67  Resp: 16  Temp: 98.1 F (36.7 C)  TempSrc: Oral  SpO2: 99%  Weight: 253 lb (114.8 kg)  Height: 5\' 4"  (1.626 m)    Body mass index is 43.43 kg/m.  Physical Exam Vitals and nursing note reviewed.  Constitutional:      General: She is not in acute distress.    Appearance: Normal appearance. She is well-developed. She is not ill-appearing, toxic-appearing or  diaphoretic.     Interventions: Face mask in place.  HENT:     Head: Normocephalic and atraumatic.  Right Ear: External ear normal.     Left Ear: External ear normal.  Eyes:     General: Lids are normal. No scleral icterus.       Right eye: No discharge.        Left eye: No discharge.     Conjunctiva/sclera: Conjunctivae normal.  Neck:     Trachea: Phonation normal. No tracheal deviation.  Cardiovascular:     Rate and Rhythm: Normal rate and regular rhythm.     Pulses: Normal pulses.          Radial pulses are 2+ on the right side and 2+ on the left side.       Posterior tibial pulses are 2+ on the right side and 2+ on the left side.     Heart sounds: Normal heart sounds. No murmur heard. No friction rub. No gallop.   Pulmonary:     Effort: Pulmonary effort is normal. No respiratory distress.     Breath sounds: Normal breath sounds. No stridor. No wheezing, rhonchi or rales.  Chest:     Chest wall: No tenderness.  Abdominal:     General: Bowel sounds are normal. There is no distension.     Palpations: Abdomen is soft.  Musculoskeletal:     Right lower leg: No edema.     Left lower leg: No edema.  Skin:    General: Skin is warm and dry.     Coloration: Skin is not jaundiced or pale.     Findings: No rash.  Neurological:     Mental Status: She is alert.     Motor: No abnormal muscle tone.     Gait: Gait normal.  Psychiatric:        Mood and Affect: Mood normal.        Speech: Speech normal.        Behavior: Behavior normal.         Assessment & Plan:   1. Diabetes mellitus type 2 in obese (HCC) DM- hx of being well controlled, dx more than 5 years ago Metformin 500 mg xr once daily - no sensitivity to it, never tried higher dose victoza 1.2 mg dose daily - interested in changing this med Checks sugars occasionally last night was 140's no highs or lows to report Denies: Polyuria, polydipsia, vision changes, neuropathy, hypoglycemia Recent pertinent labs: Lab  Results  Component Value Date   HGBA1C 6.0 (A) 01/08/2020   HGBA1C 6.1 (H) 10/10/2019   HGBA1C 6.3 (H) 03/31/2019   Standard of care and health maintenance: ACEI/ARB: yes Statin:  Intolerant - on injectable per cardiology - COMPLETE METABOLIC PANEL WITH GFR - Hemoglobin A1C  2. Essential hypertension On coreg 12.5 mg BID - no palpitations, near syncope - feels HR increase if she gets upset or anxious but no palpitations otherwise On amlodipine-valsartan and asa BP well controlled, not monitoring at home BP Readings from Last 3 Encounters:  04/09/20 130/82  02/27/20 127/85  02/11/20 (!) 152/88  - COMPLETE METABOLIC PANEL WITH GFR  3. Atherosclerosis of native coronary artery of native heart without angina pectoris Not on statin, per cardiology  4. 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 Mixed hyperlipidemia Not on statin - COMPLETE METABOLIC PANEL WITH GFR  6. Encounter for medication monitoring - COMPLETE METABOLIC PANEL WITH GFR - Hemoglobin A1C - CBC with Differential/Platelet - TSH  7.  Hypothyroidism, unspecified type Hypothyroidism: Current Medication Regimen: 137 mcg 4d a week and 150 mcg 3 d a week-  Levothyroxine  Takes medicine as prescribed Current Symptoms: denies fatigue, weight changes, heat/cold intolerance, bowel/skin changes or CVS symptoms Most recent results are below; we will be repeating labs today. Lab Results  Component Value Date   TSH 3.28 10/10/2019   - TSH  8. Need for tetanus booster - Tdap vaccine greater than or equal to 7yo IM  9. Atrial paroxysmal tachycardia [facility]) She sees cardiology regularly On coreg 12.5 mg BID - no palpitations, near syncope - feels HR increase if she gets upset or anxious but no palpitations otherwise Heart today - RRR, VSS  10. Gastroesophageal reflux disease, unspecified whether esophagitis  present GERD- well controlled pepcid bid - famotidine (PEPCID) 20 MG tablet; Take 1 tablet (20 mg total) by mouth 2 (two) times daily.  Dispense: 180 tablet; Refill: 3  11. Heart failure with preserved left ventricular function (HFpEF) ([place]) Per cardiology CHF - not using lasix hardly at all, weight stable, no orthopnea, PND, LE edema She has some DOE but just feels like she has to breath harder and is out of shape, no distress, near syncope - she doesn't have to rest and she has no associated diaphoresis, HA, vision changes, CP  12. Dyspnea on exertion Worse than her normal w/o increased weight, lower extremity edema, chest pain, patient appears euvolemic, may be some deconditioning Former smoker for more than 20 years - no hx of asthma or COPD, doesn't get bronchitis, no chronic cough, no hx of pneumonia  DOE - worse than she would expect - walking for 2-3 min she will start feeling like she's breathing heavy Can go up a few flights of stairs with SOB but not distress no having to rest   Encourage patient to work on conditioning and exercise endurance as tolerated and may want to consult with pulmonology or do PFTs  13. Intertriginous candidiasis  Intertrigo got cream from UC and wants a refill - clotirmazole - betamethasone- works well in groin and breast skin folds when rash occurs - clotrimazole-betamethasone (LOTRISONE) cream; Apply to affected area 2 times daily prn for skin fold rashes  Dispense: 15 g; Refill: 3       Return in about 6 months (around 10/09/2020) for Annual Physical.   Delsa Grana, PA-C 04/09/20 8:55 AM

## 2020-04-10 LAB — CBC WITH DIFFERENTIAL/PLATELET
Absolute Monocytes: 445 cells/uL (ref 200–950)
Basophils Absolute: 50 cells/uL (ref 0–200)
Basophils Relative: 1 %
Eosinophils Absolute: 150 cells/uL (ref 15–500)
Eosinophils Relative: 3 %
HCT: 41.8 % (ref 35.0–45.0)
Hemoglobin: 13.7 g/dL (ref 11.7–15.5)
Lymphs Abs: 2485 cells/uL (ref 850–3900)
MCH: 28.8 pg (ref 27.0–33.0)
MCHC: 32.8 g/dL (ref 32.0–36.0)
MCV: 88 fL (ref 80.0–100.0)
MPV: 8.7 fL (ref 7.5–12.5)
Monocytes Relative: 8.9 %
Neutro Abs: 1870 cells/uL (ref 1500–7800)
Neutrophils Relative %: 37.4 %
Platelets: 236 10*3/uL (ref 140–400)
RBC: 4.75 10*6/uL (ref 3.80–5.10)
RDW: 13.7 % (ref 11.0–15.0)
Total Lymphocyte: 49.7 %
WBC: 5 10*3/uL (ref 3.8–10.8)

## 2020-04-10 LAB — COMPLETE METABOLIC PANEL WITH GFR
AG Ratio: 1.2 (calc) (ref 1.0–2.5)
ALT: 15 U/L (ref 6–29)
AST: 17 U/L (ref 10–35)
Albumin: 4 g/dL (ref 3.6–5.1)
Alkaline phosphatase (APISO): 86 U/L (ref 37–153)
BUN: 11 mg/dL (ref 7–25)
CO2: 23 mmol/L (ref 20–32)
Calcium: 9 mg/dL (ref 8.6–10.4)
Chloride: 107 mmol/L (ref 98–110)
Creat: 0.81 mg/dL (ref 0.50–1.05)
GFR, Est African American: 92 mL/min/{1.73_m2} (ref >=60)
GFR, Est Non African American: 79 mL/min/{1.73_m2} (ref >=60)
Globulin: 3.3 g/dL (calc) (ref 1.9–3.7)
Glucose, Bld: 115 mg/dL — ABNORMAL HIGH (ref 65–99)
Potassium: 4.1 mmol/L (ref 3.5–5.3)
Sodium: 140 mmol/L (ref 135–146)
Total Bilirubin: 0.2 mg/dL (ref 0.2–1.2)
Total Protein: 7.3 g/dL (ref 6.1–8.1)

## 2020-04-10 LAB — TSH: TSH: 2.27 mIU/L (ref 0.40–4.50)

## 2020-04-10 LAB — HEMOGLOBIN A1C
Hgb A1c MFr Bld: 6 % of total Hgb — ABNORMAL HIGH (ref <5.7)
Mean Plasma Glucose: 126 mg/dL
eAG (mmol/L): 7 mmol/L

## 2020-04-16 ENCOUNTER — Encounter: Payer: Self-pay | Admitting: Family Medicine

## 2020-04-20 ENCOUNTER — Encounter: Payer: Self-pay | Admitting: Family Medicine

## 2020-04-20 ENCOUNTER — Telehealth: Payer: Self-pay

## 2020-04-20 DIAGNOSIS — E1169 Type 2 diabetes mellitus with other specified complication: Secondary | ICD-10-CM

## 2020-04-20 DIAGNOSIS — E661 Drug-induced obesity: Secondary | ICD-10-CM

## 2020-04-20 MED ORDER — TRULICITY 1.5 MG/0.5ML ~~LOC~~ SOAJ: 1.5000 mg | SUBCUTANEOUS | 1 refills | Status: DC

## 2020-04-20 MED ORDER — TRULICITY 0.75 MG/0.5ML ~~LOC~~ SOAJ: 0.7500 mg | SUBCUTANEOUS | 0 refills | Status: AC

## 2020-04-20 NOTE — Telephone Encounter (Signed)
Patient wanted to add she has 3 days left of Victozia and would like PCP to review her My Chart Requesting alternates that her insurance will cover.    [place], [place] Phone:  [phone]  Fax:  [phone]

## 2020-05-10 ENCOUNTER — Other Ambulatory Visit: Payer: Self-pay | Admitting: Family Medicine

## 2020-05-10 MED ORDER — AMLODIPINE BESYLATE-VALSARTAN 10-160 MG PO TABS: ORAL_TABLET | ORAL | 1 refills | Status: DC

## 2020-05-10 MED ORDER — METFORMIN HCL ER 500 MG PO TB24: 500.0000 mg | ORAL_TABLET | Freq: Every day | ORAL | 1 refills | Status: DC

## 2020-05-10 NOTE — Telephone Encounter (Signed)
Medication Refill - Medication:   amLODipine-valsartan (EXFORGE) 10-160 MG tablet   metFORMIN (GLUCOPHAGE-XR) 500 MG 24 hr tablet    Has the patient contacted their pharmacy? Yes.  contact pcp office .   Preferred Pharmacy (with phone number or street name):   [facility], [place] [address]  [address] [place] [address]  Phone: [phone] Fax: [phone]   Agent: Please be advised that RX refills may take up to 3 business days. We ask that you follow-up with your pharmacy.

## 2020-06-21 ENCOUNTER — Other Ambulatory Visit: Payer: Self-pay | Admitting: Family Medicine

## 2020-06-29 NOTE — Telephone Encounter (Signed)
Pt has an appt on 10/15/20

## 2020-06-29 NOTE — Addendum Note (Signed)
Addended by: Jefferson Fuel on: 06/29/2020 10:09 AM   Modules accepted: Orders

## 2020-06-29 NOTE — Telephone Encounter (Addendum)
Pt also needs refill for levothyroxine (SYNTHROID) 150 MCG tablet / she takes both a 175mcg and this 179mcg / please send to [facility] [place], [place]  [address], [place] [address]  Phone:  [phone]  Fax:  [phone]   Pharmacy received the RX for 14mcg but need the Rx for 125mcg as well

## 2020-06-29 NOTE — Telephone Encounter (Signed)
   Notes to clinic: review for continued use of both doses of thyroid medication    Requested Prescriptions  Pending Prescriptions Disp Refills   levothyroxine (SYNTHROID) 150 MCG tablet 78 tablet 1    Sig: Take 1 tablet by mouth once daily on Monday, Wednesday and Friday and take 137 mcg on the other days.      Endocrinology:  Hypothyroid Agents Failed - 06/29/2020 10:09 AM      Failed - TSH needs to be rechecked within 3 months after an abnormal result. Refill until TSH is due.      Passed - TSH in normal range and within 360 days    TSH  Date Value Ref Range Status  04/09/2020 2.27 0.40 - 4.50 mIU/L Final          Passed - Valid encounter within last 12 months    Recent Outpatient Visits           2 months ago Diabetes mellitus type 2 in obese [facility])   [facility] Delsa Grana, PA-C   5 months ago Preoperative examination   [facility] Delsa Grana, PA-C   6 months ago Pain of upper abdomen   [facility] Delsa Grana, PA-C   8 months ago Annual physical exam   [facility] Delsa Grana, PA-C   1 year ago Mixed hyperlipidemia   [facility] Delsa Grana, [place]       Future Appointments             In 3 months Delsa Grana, PA-C [facility], [place]              Signed Prescriptions Disp Refills   levothyroxine (SYNTHROID) 137 MCG tablet 48 tablet 3    Sig: Take 1 tablet (137 mcg total) by mouth before breakfast on Tuesdays, Thursdays, Saturdays, and Sundays.      Endocrinology:  Hypothyroid Agents Failed - 06/21/2020  7:22 AM      Failed - TSH needs to be rechecked within 3 months after an abnormal result. Refill until TSH is due.      Passed - TSH in normal range and within 360 days    TSH  Date Value Ref Range Status  04/09/2020 2.27 0.40 - 4.50 mIU/L Final          Passed - Valid encounter within last 12 months    Recent Outpatient Visits            2 months ago Diabetes mellitus type 2 in obese [facility])   [facility] Delsa Grana, PA-C   5 months ago Preoperative examination   [facility] Delsa Grana, PA-C   6 months ago Pain of upper abdomen   [facility] Delsa Grana, PA-C   8 months ago Annual physical exam   [facility] Delsa Grana, PA-C   1 year ago Mixed hyperlipidemia   [facility] Delsa Grana, PA-C       Future Appointments             In 3 months Delsa Grana, PA-C [facility], [facility]

## 2020-06-30 ENCOUNTER — Other Ambulatory Visit: Payer: Self-pay | Admitting: Emergency Medicine

## 2020-06-30 LAB — HM DIABETES EYE EXAM

## 2020-06-30 MED ORDER — LEVOTHYROXINE SODIUM 150 MCG PO TABS: ORAL_TABLET | ORAL | 1 refills | Status: DC

## 2020-07-01 ENCOUNTER — Other Ambulatory Visit: Payer: Self-pay

## 2020-07-02 ENCOUNTER — Encounter: Payer: Self-pay | Admitting: Family Medicine

## 2020-09-01 NOTE — Telephone Encounter (Signed)
Close chart

## 2020-09-15 ENCOUNTER — Other Ambulatory Visit: Payer: Self-pay | Admitting: Family Medicine

## 2020-09-15 DIAGNOSIS — I480 Paroxysmal atrial fibrillation: Secondary | ICD-10-CM

## 2020-09-15 DIAGNOSIS — I10 Essential (primary) hypertension: Secondary | ICD-10-CM

## 2020-10-15 ENCOUNTER — Encounter: Payer: BC Managed Care – PPO | Admitting: Family Medicine

## 2020-10-22 ENCOUNTER — Other Ambulatory Visit: Payer: Self-pay | Admitting: Family Medicine

## 2020-10-22 DIAGNOSIS — I10 Essential (primary) hypertension: Secondary | ICD-10-CM

## 2020-10-22 DIAGNOSIS — I480 Paroxysmal atrial fibrillation: Secondary | ICD-10-CM

## 2020-11-01 ENCOUNTER — Other Ambulatory Visit (HOSPITAL_COMMUNITY)
Admission: RE | Admit: 2020-11-01 | Discharge: 2020-11-01 | Disposition: A | Discharge status: Home / Self Care | Payer: BC Managed Care – PPO | Source: Ambulatory Visit | Attending: Family Medicine | Admitting: Family Medicine

## 2020-11-01 ENCOUNTER — Other Ambulatory Visit: Payer: Self-pay

## 2020-11-01 ENCOUNTER — Encounter: Payer: Self-pay | Admitting: Family Medicine

## 2020-11-01 ENCOUNTER — Ambulatory Visit (INDEPENDENT_AMBULATORY_CARE_PROVIDER_SITE_OTHER): Payer: BC Managed Care – PPO | Admitting: Family Medicine

## 2020-11-01 VITALS — BP 132/78 | HR 86 | Temp 97.7°F | Resp 16 | Ht 64.0 in | Wt 254.4 lb

## 2020-11-01 DIAGNOSIS — Z113 Encounter for screening for infections with a predominantly sexual mode of transmission: Secondary | ICD-10-CM | POA: Insufficient documentation

## 2020-11-01 DIAGNOSIS — E1169 Type 2 diabetes mellitus with other specified complication: Secondary | ICD-10-CM | POA: Diagnosis not present

## 2020-11-01 DIAGNOSIS — E119 Type 2 diabetes mellitus without complications: Secondary | ICD-10-CM

## 2020-11-01 DIAGNOSIS — Z6841 Body Mass Index (BMI) 40.0 and over, adult: Secondary | ICD-10-CM

## 2020-11-01 DIAGNOSIS — E039 Hypothyroidism, unspecified: Secondary | ICD-10-CM

## 2020-11-01 DIAGNOSIS — I4719 Other supraventricular tachycardia: Secondary | ICD-10-CM

## 2020-11-01 DIAGNOSIS — E669 Obesity, unspecified: Secondary | ICD-10-CM

## 2020-11-01 DIAGNOSIS — E782 Mixed hyperlipidemia: Secondary | ICD-10-CM

## 2020-11-01 DIAGNOSIS — E2839 Other primary ovarian failure: Secondary | ICD-10-CM

## 2020-11-01 DIAGNOSIS — I10 Essential (primary) hypertension: Secondary | ICD-10-CM | POA: Diagnosis not present

## 2020-11-01 DIAGNOSIS — E661 Drug-induced obesity: Secondary | ICD-10-CM

## 2020-11-01 DIAGNOSIS — I480 Paroxysmal atrial fibrillation: Secondary | ICD-10-CM | POA: Diagnosis not present

## 2020-11-01 DIAGNOSIS — I471 Supraventricular tachycardia: Secondary | ICD-10-CM

## 2020-11-01 DIAGNOSIS — R0981 Nasal congestion: Secondary | ICD-10-CM

## 2020-11-01 DIAGNOSIS — H6982 Other specified disorders of Eustachian tube, left ear: Secondary | ICD-10-CM

## 2020-11-01 DIAGNOSIS — I5030 Unspecified diastolic (congestive) heart failure: Secondary | ICD-10-CM

## 2020-11-01 DIAGNOSIS — H6992 Unspecified Eustachian tube disorder, left ear: Secondary | ICD-10-CM

## 2020-11-01 DIAGNOSIS — I5042 Chronic combined systolic (congestive) and diastolic (congestive) heart failure: Secondary | ICD-10-CM

## 2020-11-01 DIAGNOSIS — Z760 Encounter for issue of repeat prescription: Secondary | ICD-10-CM

## 2020-11-01 DIAGNOSIS — E785 Hyperlipidemia, unspecified: Secondary | ICD-10-CM

## 2020-11-01 MED ORDER — METFORMIN HCL ER 500 MG PO TB24: 500.0000 mg | ORAL_TABLET | Freq: Every day | ORAL | 3 refills | Status: DC

## 2020-11-01 MED ORDER — INSULIN PEN NEEDLE 32G X 4 MM MISC: 99 refills | Status: DC

## 2020-11-01 MED ORDER — CARVEDILOL 25 MG PO TABS: ORAL_TABLET | ORAL | 3 refills | Status: DC

## 2020-11-01 MED ORDER — TRULICITY 1.5 MG/0.5ML ~~LOC~~ SOAJ: 1.5000 mg | SUBCUTANEOUS | 3 refills | Status: DC

## 2020-11-01 MED ORDER — GLUCOSE BLOOD VI STRP: ORAL_STRIP | 12 refills | Status: DC

## 2020-11-01 MED ORDER — AMLODIPINE BESYLATE-VALSARTAN 10-160 MG PO TABS: ORAL_TABLET | ORAL | 3 refills | Status: DC

## 2020-11-01 MED ORDER — FLUTICASONE PROPIONATE 50 MCG/ACT NA SUSP: 2.0000 | Freq: Every day | NASAL | 2 refills | Status: DC

## 2020-11-01 NOTE — Assessment & Plan Note (Signed)
At goal, no changes made today. Obtaining labs.

## 2020-11-01 NOTE — Progress Notes (Signed)
BP 132/78   Pulse 86   Temp 97.7 F (36.5 C)   Resp 16   Ht 5\' 4"  (1.626 m)   Wt 254 lb 6.4 oz (115.4 kg)   LMP  (LMP Unknown)   SpO2 99%   BMI 43.67 kg/m    Subjective:    Patient ID: Elizabeth Russo, female    DOB: Oct 27, 1960, 60 y.o.   MRN: [id number]  HPI: Elizabeth Russo is a 60 y.o. female presenting on 11/01/2020 for comprehensive medical examination. Current medical complaints include:none  Hypertension, HFpEF, h/o NSTEMI: - Medications: amlodipine-valsartan, coreg, lasix prn, ASA - Compliance: not on lasix (been off for about a year) - Checking BP at home: yes, doesn't remember numbers - Denies any SOB, CP, vision changes, LE edema, medication SEs, or symptoms of hypotension - endorses some palpitations - intolerant to statin  Diabetes, Type 2 - Last A1c 6.0 04/2020 - Medications: metformin, trulicity - Compliance: good - Checking BG at home: yes, 110-140s. One last week 22. - Eye exam: UTD - Foot exam: due - Microalbumin: due - Statin: no - PNA vaccine: due - Denies symptoms of hypoglycemia, polyuria, polydipsia, numbness extremities, foot ulcers/trauma  Hypothyroidism - Medications: Synthroid 137, 122mcg (alternates eod) - Current symptoms:  none - Denies diarrhea, heat / cold intolerance, and weight changes - Symptoms have been well-controlled  Gout - last flare years ago. Just one episode. Previously had in big toe and knee.  - no issues currently  H/o Tobacco use - quit 7 years ago. 0.5ppd for 20 years.  GERD - on pepcid prn, depends on what she eats.  She currently lives with: sister Menopausal Symptoms:  every once in a while  Depression Screen done today and results listed below:  Depression screen [facility] 2/9 11/01/2020 04/09/2020 01/08/2020 12/03/2019 10/10/2019  Decreased Interest 0 0 0 0 0  Down, Depressed, Hopeless 0 0 0 0 0  PHQ - 2 Score 0 0 0 0 0  Altered sleeping 0 - - - -  Tired, decreased energy 0 - - - -  Change in appetite 0 - -  - -  Feeling bad or failure about yourself  0 - - - -  Trouble concentrating 0 - - - -  Moving slowly or fidgety/restless 0 - - - -  Suicidal thoughts 0 - - - -  PHQ-9 Score 0 - - - -  Difficult doing work/chores Not difficult at all - - - -  Some recent data might be hidden    The patient does not have a history of falls. I did not complete a risk assessment for falls. A plan of care for falls was not documented.   Past Medical History:  Past Medical History:  Diagnosis Date   (HFpEF) heart failure with preserved ejection fraction (HCC)    Atrial fibrillation ([place]) 02/13/2011   Breast mass, right 04/25/2014   CAD (coronary artery disease)    Diabetes mellitus without complication ([place])    Diverticulosis    Family history of colon cancer in father    Goiter 03/19/2014   Hearing loss 08/25/2011   HLD (hyperlipidemia)    Hypertension    Hypothyroid    Localized enlarged lymph nodes 10/23/2014   cervical    Low back pain 12/25/2014   Morbid obesity ([place]) 06/26/2014   Myalgia due to statin 04/13/2017   Intolerant to three different statins   NSTEMI (non-ST elevated myocardial infarction) ([place]) 01/2011   Subendocardial  Personal history of colonic polyps    Vertigo     Surgical History:  Past Surgical History:  Procedure Laterality Date   ABDOMINAL HYSTERECTOMY     CARDIAC CATHETERIZATION     CHOLECYSTECTOMY     COLONOSCOPY WITH PROPOFOL N/A 08/13/2017   Procedure: COLONOSCOPY WITH PROPOFOL;  Surgeon: Lin Landsman, MD;  Location: [facility] ENDOSCOPY;  Service: Gastroenterology;  Laterality: N/A;   heart stent  2012    Medications:  Current Outpatient Medications on File Prior to Visit  Medication Sig   Alirocumab (PRALUENT) 75 MG/ML SOAJ Inject 75 mg into the skin every 14 (fourteen) days.   aspirin EC 81 MG tablet Take 81 mg by mouth daily.    clotrimazole-betamethasone (LOTRISONE) cream Apply to affected area 2 times daily prn for skin fold rashes   diclofenac sodium  (VOLTAREN) 1 % GEL Apply 2 g topically daily as needed (pain).   famotidine (PEPCID) 20 MG tablet Take 1 tablet (20 mg total) by mouth 2 (two) times daily.   furosemide (LASIX) 20 MG tablet Take when needed for Edema.   levothyroxine (SYNTHROID) 137 MCG tablet Take 1 tablet (137 mcg total) by mouth before breakfast on Tuesdays, Thursdays, Saturdays, and Sundays.   levothyroxine (SYNTHROID) 150 MCG tablet Take 1 tablet by mouth once daily on Monday, Wednesday and Friday and take 137 mcg on the other days.   meclizine (ANTIVERT) 25 MG tablet Take 1 tablet (25 mg total) by mouth Three (3) times a day as needed for dizziness.   No current facility-administered medications on file prior to visit.    Allergies:  Allergies  Allergen Reactions   Atorvastatin Other (See Comments)    Other reaction(s): Muscle Pain   Hydralazine Other (See Comments)    Aggravated gout Aggravated gout   Hydrochlorothiazide Other (See Comments)    Aggravated gout   Lisinopril Cough   Pravastatin Other (See Comments)    Other reaction(s): Muscle Pain   Rosuvastatin Other (See Comments)    Other reaction(s): Muscle Pain    Social History:  Social History   Socioeconomic History   Marital status: Widowed    Spouse name: Not on file   Number of children: 2   Years of education: Not on file   Highest education level: High school graduate  Occupational History   Not on file  Tobacco Use   Smoking status: Former    Packs/day: 0.50    Years: 30.00    Pack years: 15.00    Types: Cigarettes    Start date: 01/09/1978    Quit date: 03/09/2013    Years since quitting: 7.6   Smokeless tobacco: Never  Vaping Use   Vaping Use: Never used  Substance and Sexual Activity   Alcohol use: Yes    Alcohol/week: 0.0 standard drinks    Comment: Socially    Drug use: No   Sexual activity: Yes    Birth control/protection: Post-menopausal  Other Topics Concern   Not on file  Social History Narrative   Not on file    Social Determinants of Health   Financial Resource Strain: Low Risk    Difficulty of Paying Living Expenses: Not hard at all  Food Insecurity: No Food Insecurity   Worried About Charity fundraiser in the Last Year: Never true   [place] in the Last Year: Never true  Transportation Needs: No Transportation Needs   Lack of Transportation (Medical): No   Lack of Transportation (Non-Medical): No  Physical Activity: Insufficiently Active   Days of Exercise per Week: 2 days   Minutes of Exercise per Session: 20 min  Stress: No Stress Concern Present   Feeling of Stress : Only a little  Social Connections: Moderately Isolated   Frequency of Communication with Friends and Family: More than three times a week   Frequency of Social Gatherings with Friends and Family: Once a week   Attends Religious Services: 1 to 4 times per year   Active Member of [employer] or Organizations: No   Attends Archivist Meetings: Never   Marital Status: Widowed  Human resources officer Violence: Not At Risk   Fear of Current or Ex-Partner: No   Emotionally Abused: No   Physically Abused: No   Sexually Abused: No   Social History   Tobacco Use  Smoking Status Former   Packs/day: 0.50   Years: 30.00   Pack years: 15.00   Types: Cigarettes   Start date: 01/09/1978   Quit date: 03/09/2013   Years since quitting: 7.6  Smokeless Tobacco Never   Social History   Substance and Sexual Activity  Alcohol Use Yes   Alcohol/week: 0.0 standard drinks   Comment: Socially     Family History:  Family History  Problem Relation Age of Onset   Heart disease Mother    Diabetes Mother    Hypertension Mother    Hyperlipidemia Mother    Thyroid disease Mother    Cancer Father        colon   Diabetes Father    Hypertension Father    Diabetes Sister    Hypertension Sister    Heart disease Sister    Hyperlipidemia Sister    Kidney disease Sister    Cancer Maternal Grandfather        lung   Heart  attack Paternal Grandfather    Heart disease Sister    Diabetes Sister    Heart disease Sister    Arthritis Sister     Past medical history, surgical history, medications, allergies, family history and social history reviewed with patient today and changes made to appropriate areas of the chart.      Objective:    BP 132/78   Pulse 86   Temp 97.7 F (36.5 C)   Resp 16   Ht 5\' 4"  (1.626 m)   Wt 254 lb 6.4 oz (115.4 kg)   LMP  (LMP Unknown)   SpO2 99%   BMI 43.67 kg/m   Wt Readings from Last 3 Encounters:  11/01/20 254 lb 6.4 oz (115.4 kg)  04/09/20 253 lb (114.8 kg)  02/11/20 253 lb (114.8 kg)    Physical Exam Constitutional:      Appearance: She is obese. She is not ill-appearing.  HENT:     Head: Normocephalic.     Right Ear: External ear normal.     Left Ear: External ear normal.  Cardiovascular:     Rate and Rhythm: Normal rate and regular rhythm.     Pulses: Normal pulses.     Heart sounds: Normal heart sounds. No murmur heard. Pulmonary:     Effort: Pulmonary effort is normal.     Breath sounds: Normal breath sounds.  Abdominal:     General: Bowel sounds are normal.     Palpations: Abdomen is soft.     Tenderness: There is no abdominal tenderness.  Musculoskeletal:        General: Normal range of motion.     Right lower leg:  No edema.     Left lower leg: No edema.  Skin:    General: Skin is warm and dry.  Neurological:     Mental Status: She is alert and oriented to person, place, and time. Mental status is at baseline.  Psychiatric:        Mood and Affect: Mood normal.        Behavior: Behavior normal.    Results for orders placed or performed in visit on 07/02/20  HM DIABETES EYE EXAM  Result Value Ref Range   HM Diabetic Eye Exam No Retinopathy No Retinopathy      Assessment & Plan:   Problem List Items Addressed This Visit       Cardiovascular and Mediastinum   Essential hypertension - Primary    At goal, no changes made today.  Obtaining labs.       Relevant Medications   carvedilol (COREG) 25 MG tablet   amLODipine-valsartan (EXFORGE) 10-160 MG tablet   Other Relevant Orders   Lipid panel   Heart failure with preserved left ventricular function (HFpEF) (HCC)    Asymptomatic, euvolemic today. Continue to follow with Cardiology, due for f/u next month.      Relevant Medications   carvedilol (COREG) 25 MG tablet   amLODipine-valsartan (EXFORGE) 10-160 MG tablet   Atrial paroxysmal tachycardia (HCC)   Relevant Medications   carvedilol (COREG) 25 MG tablet   amLODipine-valsartan (EXFORGE) 10-160 MG tablet   Chronic combined systolic and diastolic heart failure (HCC)   Relevant Medications   carvedilol (COREG) 25 MG tablet   amLODipine-valsartan (EXFORGE) 10-160 MG tablet     Endocrine   Hypothyroidism (Chronic)    Recheck labs today.      Relevant Medications   carvedilol (COREG) 25 MG tablet   Diabetes mellitus type 2 in obese (HCC)    Compliant with regimen. Recheck A1c and adjust as needed. PNA vaccine given today. Foot exam performed today, with callouses, buffs at home, declines podiatry referral or shaving today. F/u in 6 months.      Relevant Medications   metFORMIN (GLUCOPHAGE-XR) 500 MG 24 hr tablet   Dulaglutide (TRULICITY) 1.5 LT/9.0ZE SOPN   amLODipine-valsartan (EXFORGE) 10-160 MG tablet   Insulin Pen Needle 32G X 4 MM MISC   Other Relevant Orders   Hemoglobin S9Q   Basic Metabolic Panel (BMET)   Urine Microalbumin w/creat. 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 Recommend weight loss through diet and exercise.       Relevant Medications   metFORMIN  (GLUCOPHAGE-XR) 500 MG 24 hr tablet   Dulaglutide (TRULICITY) 1.5 ZR/0.0TM SOPN   Dyslipidemia    Recheck labs today.      Other Visit Diagnoses     Estrogen deficiency       Relevant Orders   DG Bone Density   Essential hypertension, benign  (Chronic)      Relevant Medications   carvedilol (COREG) 25 MG tablet   amLODipine-valsartan (EXFORGE) 10-160 MG tablet   Paroxysmal atrial fibrillation (HCC)       Relevant Medications   carvedilol (COREG) 25 MG tablet   amLODipine-valsartan (EXFORGE) 10-160 MG  tablet   Type 2 diabetes mellitus not at goal [facility])       Relevant Medications   metFORMIN (GLUCOPHAGE-XR) 500 MG 24 hr tablet   Dulaglutide (TRULICITY) 1.5 QQ/5.9DG SOPN   amLODipine-valsartan (EXFORGE) 10-160 MG tablet   Insulin Pen Needle 32G X 4 MM MISC   Medication refill       Relevant Medications   fluticasone (FLONASE) 50 MCG/ACT nasal spray   Eustachian tube dysfunction, left       Relevant Medications   fluticasone (FLONASE) 50 MCG/ACT nasal spray   Nasal congestion       Relevant Medications   fluticasone (FLONASE) 50 MCG/ACT nasal spray   Screen for STD (sexually transmitted disease)       Relevant Orders   Urine cytology ancillary only        Follow up plan: No follow-ups on file.   LABORATORY TESTING:  - Pap smear: not applicable, s/p hysterectomy  IMMUNIZATIONS:   - Tdap: Tetanus vaccination status reviewed: last tetanus booster within 10 years. - Influenza: Refused, will get at work - Pneumococcal: Administered today - HPV: Not applicable - Shingrix vaccine: Up to date - COVID vaccine: has received 4 doses of mRNA vaccine  SCREENING: -Mammogram: Up to date  - Colonoscopy: Up to date  - Bone Density: Ordered today  - Lung Cancer Screening: Not applicable   Hep C Screening: UTD STD testing and prevention (HIV/chl/gon/syphilis): ordered today Sexual History: currently sexually active, does not use condoms. Menstrual History/LMP/Abnormal  Bleeding: none, post-menopausal Incontinence Symptoms: none  Osteoporosis: Discussed high calcium and vitamin D supplementation, weight bearing exercises  Advanced Care Planning: A voluntary discussion about advance care planning including the explanation and discussion of advance directives.  Discussed health care proxy and Living will, and the patient was able to identify a health care proxy as daughter, Sandria Manly.  Patient does not have a living will at present time. If patient does have living will, I have requested they bring this to the clinic to be scanned in to their chart.  PATIENT COUNSELING:   Advised to take 1 mg of folate supplement per day if capable of pregnancy.   Sexuality: Discussed sexually transmitted diseases, partner selection, use of condoms, avoidance of unintended pregnancy  and contraceptive alternatives.   Advised to avoid cigarette smoking.  I discussed with the patient that most people either abstain from alcohol or drink within safe limits (<=14/week and <=4 drinks/occasion for males, <=7/weeks and <= 3 drinks/occasion for females) and that the risk for alcohol disorders and other health effects rises proportionally with the number of drinks per week and how often a drinker exceeds daily limits.  Discussed cessation/primary prevention of drug use and availability of treatment for abuse.   Diet: Encouraged to adjust caloric intake to maintain  or achieve ideal body weight, to reduce intake of dietary saturated fat and total fat, to limit sodium intake by avoiding high sodium foods and not adding table salt, and to maintain adequate dietary potassium and calcium preferably from fresh fruits, vegetables, and low-fat dairy products.    stressed the importance of regular exercise  Injury prevention: Discussed safety belts, safety helmets, smoke detector, smoking near bedding or upholstery.   Dental health: Discussed importance of regular tooth brushing,  flossing, and dental visits.    NEXT PREVENTATIVE PHYSICAL DUE IN 1 YEAR. No follow-ups on file.

## 2020-11-01 NOTE — Assessment & Plan Note (Signed)
Recheck labs today. 

## 2020-11-01 NOTE — Assessment & Plan Note (Signed)
Asymptomatic, euvolemic today. Continue to follow with Cardiology, due for f/u next month.

## 2020-11-01 NOTE — Assessment & Plan Note (Signed)
Contributing to DM, HTN. Recommend weight loss through diet and exercise.

## 2020-11-01 NOTE — Assessment & Plan Note (Signed)
Compliant with regimen. Recheck A1c and adjust as needed. PNA vaccine given today. Foot exam performed today, with callouses, buffs at home, declines podiatry referral or shaving today. F/u in 6 months.

## 2020-11-02 ENCOUNTER — Telehealth: Payer: Self-pay | Admitting: Family Medicine

## 2020-11-02 LAB — BASIC METABOLIC PANEL
BUN: 9 mg/dL (ref 7–25)
CO2: 25 mmol/L (ref 20–32)
Calcium: 8.8 mg/dL (ref 8.6–10.4)
Chloride: 107 mmol/L (ref 98–110)
Creat: 0.85 mg/dL (ref 0.50–1.03)
Glucose, Bld: 100 mg/dL — ABNORMAL HIGH (ref 65–99)
Potassium: 4 mmol/L (ref 3.5–5.3)
Sodium: 139 mmol/L (ref 135–146)

## 2020-11-02 LAB — LIPID PANEL
Cholesterol: 101 mg/dL (ref <200)
HDL: 39 mg/dL — ABNORMAL LOW (ref >=50)
LDL Cholesterol (Calc): 43 mg/dL (calc)
Non-HDL Cholesterol (Calc): 62 mg/dL (calc) (ref <130)
Total CHOL/HDL Ratio: 2.6 (calc) (ref <5.0)
Triglycerides: 103 mg/dL (ref <150)

## 2020-11-02 LAB — MICROALBUMIN / CREATININE URINE RATIO
Creatinine, Urine: 88 mg/dL (ref 20–275)
Microalb Creat Ratio: 5 mcg/mg creat (ref <30)
Microalb, Ur: 0.4 mg/dL

## 2020-11-02 LAB — HEMOGLOBIN A1C
Hgb A1c MFr Bld: 5.8 % of total Hgb — ABNORMAL HIGH (ref <5.7)
Mean Plasma Glucose: 120 mg/dL
eAG (mmol/L): 6.6 mmol/L

## 2020-11-02 NOTE — Telephone Encounter (Signed)
Request for refill of med that was already sent to [facility].  [place] and spoke with Benjamine Mola who stated the med is there and is ready for pt to pick up.  Refusing duplicate request. Requested Prescriptions  Pending Prescriptions Disp Refills   TRULICITY 5.78 IO/9.6EX SOPN [Pharmacy Med Name: Trulicity 5.28 UX/3.2 mL subcutaneous pen injector (dulaglutide)] 2 mL 0    Sig: Inject 0.75 mg into the skin once a week for 4 doses then fill rx for 1.5 mg     Endocrinology:  Diabetes - GLP-1 Receptor Agonists Passed - 11/02/2020  7:39 AM      Passed - HBA1C is between 0 and 7.9 and within 180 days    HbA1c, POC (prediabetic range)  Date Value Ref Range Status  04/22/2018 5.9 5.7 - 6.4 % Final   HbA1c, POC (controlled diabetic range)  Date Value Ref Range Status  04/22/2018 5.9 0.0 - 7.0 % Final   HbA1c POC (<> result, manual entry)  Date Value Ref Range Status  04/22/2018 5.9 4.0 - 5.6 % Final   Hgb A1c MFr Bld  Date Value Ref Range Status  11/01/2020 5.8 (H) <5.7 % of total Hgb Final    Comment:    For someone without known diabetes, a hemoglobin  A1c value between 5.7% and 6.4% is consistent with prediabetes and should be confirmed with a  follow-up test. . For someone with known diabetes, a value <7% indicates that their diabetes is well controlled. A1c targets should be individualized based on duration of diabetes, age, comorbid conditions, and other considerations. . This assay result is consistent with an increased risk of diabetes. . Currently, no consensus exists regarding use of hemoglobin A1c for diagnosis of diabetes for children. Renella Cunas - Valid encounter within last 6 months    Recent Outpatient Visits           Yesterday Essential hypertension   [facility] Myles Gip, DO   6 months ago Diabetes mellitus type 2 in obese [facility])   [facility] Delsa Grana, PA-C   9 months ago Preoperative  examination   [facility] Delsa Grana, PA-C   11 months ago Pain of upper abdomen   [facility] Delsa Grana, PA-C   1 year ago Annual physical exam   [facility] Delsa Grana, [place]

## 2020-11-02 NOTE — Telephone Encounter (Signed)
Pt was informed refills were sent to her [facility].

## 2020-11-03 LAB — URINE CYTOLOGY ANCILLARY ONLY
Chlamydia: NEGATIVE
Comment: NEGATIVE
Comment: NEGATIVE
Comment: NORMAL
Neisseria Gonorrhea: NEGATIVE
Trichomonas: NEGATIVE

## 2020-11-11 ENCOUNTER — Telehealth: Payer: Self-pay | Admitting: *Deleted

## 2020-11-11 NOTE — Telephone Encounter (Signed)
Prior Authorization for Praluent is approved from 11/11/2020 to 11/11/2021.  Patient notified of approval.

## 2020-11-11 NOTE — Telephone Encounter (Signed)
PA required for Praluent 75 mg/ml inj. PA has been submitted via paper fax to [phone]. Awaiting approval.  Telephone:[phone]

## 2020-12-06 ENCOUNTER — Other Ambulatory Visit: Payer: Self-pay

## 2020-12-06 ENCOUNTER — Encounter: Payer: Self-pay | Admitting: Cardiology

## 2020-12-06 ENCOUNTER — Ambulatory Visit (INDEPENDENT_AMBULATORY_CARE_PROVIDER_SITE_OTHER): Payer: BC Managed Care – PPO | Admitting: Cardiology

## 2020-12-06 VITALS — BP 136/70 | HR 52 | Ht 64.0 in | Wt 257.0 lb

## 2020-12-06 DIAGNOSIS — E7800 Pure hypercholesterolemia, unspecified: Secondary | ICD-10-CM

## 2020-12-06 DIAGNOSIS — I2510 Atherosclerotic heart disease of native coronary artery without angina pectoris: Secondary | ICD-10-CM

## 2020-12-06 DIAGNOSIS — I10 Essential (primary) hypertension: Secondary | ICD-10-CM

## 2020-12-06 DIAGNOSIS — I471 Supraventricular tachycardia: Secondary | ICD-10-CM

## 2020-12-06 NOTE — Patient Instructions (Signed)
Medication Instructions:   Your physician recommends that you continue on your current medications as directed. Please refer to the Current Medication list given to you today.  *If you need a refill on your cardiac medications before your next appointment, please call your pharmacy*   Lab Work:  None ordered  If you have labs (blood work) drawn today and your tests are completely normal, you will receive your results only by: May (if you have MyChart) OR A paper copy in the mail If you have any lab test that is abnormal or we need to change your treatment, we will call you to review the results.   Testing/Procedures:  None ordered   Follow-Up: At [facility], you and your health needs are our priority.  As part of our continuing mission to provide you with exceptional heart care, we have created designated Provider Care Teams.  These Care Teams include your primary Cardiologist (physician) and Advanced Practice Providers (APPs -  Physician Assistants and Nurse Practitioners) who all work together to provide you with the care you need, when you need it.  We recommend signing up for the patient portal called "MyChart".  Sign up information is provided on this After Visit Summary.  MyChart is used to connect with patients for Virtual Visits (Telemedicine).  Patients are able to view lab/test results, encounter notes, upcoming appointments, etc.  Non-urgent messages can be sent to your provider as well.   To learn more about what you can do with MyChart, go to [email or web address].    Your next appointment:   1 week(s)  The format for your next appointment:   In Person  Provider:   You may see Kate Sable, MD or one of the following Advanced Practice Providers on your designated Care Team:   Murray Hodgkins, NP Christell Faith, PA-C Cadence Kathlen Mody, [place]    Other Instructions

## 2020-12-06 NOTE — Progress Notes (Signed)
Cardiology Office Note:    Date:  12/06/2020   ID:  [name], DOB [birth year], MRN [id number]  PCP:  Elizabeth Grana, PA-C  Cardiologist:  Elizabeth Sable, MD  Electrophysiologist:  None   Referring MD: Elizabeth Grana, PA-C    Chief Complaint  Patient presents with   Other    12 month follow up -- Meds reviewed verbally with patient.     History of Present Illness:    Elizabeth Russo is a 60 y.o. female with a hx of atrial tachycardia, hypertension, CAD,(NSTEMI with PCI/DES to OM2, mid RCA occ, January 2013),  who presents for follow-up.    Being seen due to history of atrial tachycardia and CAD.  Denies chest pain.  States having occasional palpitations tolerating medications accordingly maybe once a week, no dizziness, syncope.  Working on losing weight.  Tolerating all medications as prescribed has no other concerns at this time.  Prior notes Previously follow-up at [facility].  diagnosed with NSTEMI 2013. Left heart cath was performed which showed an occluded mid RCA, 90% stenosis in a small OM1 branch.  50% proximal RCA, distal LAD large OM 2.  PCI was performed to the OM 2 branch with a drug-eluting stent.   In 2018, she had a stress echocardiogram, exercising for 6 minutes achieving 10.1 METS with no wall motion abnormalities.  Last echocardiogram in 2013 showed EF of 45%, with mild concentric LVH.   She has a history of myalgia on statin.  She is currently taking Zetia and Praluent for hyperlipidemia..   Echo 12/2018 showed preserved ejection fraction, EF 60 to 65%, impaired relaxation.  Did not tolerate verapamil in the past, Coreg was started. Lexiscan Myoview 2021 showed no ischemia. .  Past Medical History:  Diagnosis Date   (HFpEF) heart failure with preserved ejection fraction (HCC)    Atrial fibrillation ([place]) 02/13/2011   Breast mass, right 04/25/2014   CAD (coronary artery disease)    Diabetes mellitus without complication ([place])    Diverticulosis     Family history of colon cancer in father    Goiter 03/19/2014   Hearing loss 08/25/2011   HLD (hyperlipidemia)    Hypertension    Hypothyroid    Localized enlarged lymph nodes 10/23/2014   cervical    Low back pain 12/25/2014   Morbid obesity ([place]) 06/26/2014   Myalgia due to statin 04/13/2017   Intolerant to three different statins   NSTEMI (non-ST elevated myocardial infarction) ([place]) 01/2011   Subendocardial   Personal history of colonic polyps    Vertigo     Past Surgical History:  Procedure Laterality Date   ABDOMINAL HYSTERECTOMY     CARDIAC CATHETERIZATION     CHOLECYSTECTOMY     COLONOSCOPY WITH PROPOFOL N/A 08/13/2017   Procedure: COLONOSCOPY WITH PROPOFOL;  Surgeon: Elizabeth Landsman, MD;  Location: [facility] ENDOSCOPY;  Service: Gastroenterology;  Laterality: N/A;   heart stent  2012    Current Medications: Current Meds  Medication Sig   Alirocumab (PRALUENT) 75 MG/ML SOAJ Inject 75 mg into the skin every 14 (fourteen) days.   amLODipine-valsartan (EXFORGE) 10-160 MG tablet Take 1 tablet by mouth daily.   aspirin EC 81 MG tablet Take 81 mg by mouth daily.    carvedilol (COREG) 25 MG tablet Take 0.5 tablets (12.5 mg total) by mouth Two (2) times a day with meals.   clotrimazole-betamethasone (LOTRISONE) cream Apply to affected area 2 times daily prn for skin fold rashes   diclofenac sodium (  VOLTAREN) 1 % GEL Apply 2 g topically daily as needed (pain).   Dulaglutide (TRULICITY) 1.5 UK/0.2RK SOPN Inject 1.5 mg into the skin once a week.   famotidine (PEPCID) 20 MG tablet Take 1 tablet (20 mg total) by mouth 2 (two) times daily.   fluticasone (FLONASE) 50 MCG/ACT nasal spray Place 2 sprays into both nostrils daily.   furosemide (LASIX) 20 MG tablet Take when needed for Edema.   glucose blood test strip Test tid   Insulin Pen Needle 32G X 4 MM MISC Use as directed   levothyroxine (SYNTHROID) 137 MCG tablet Take 1 tablet (137 mcg total) by mouth before breakfast on Tuesdays,  Thursdays, Saturdays, and Sundays.   levothyroxine (SYNTHROID) 150 MCG tablet Take 1 tablet by mouth once daily on Monday, Wednesday and Friday and take 137 mcg on the other days.   meclizine (ANTIVERT) 25 MG tablet Take 1 tablet (25 mg total) by mouth Three (3) times a day as needed for dizziness.   metFORMIN (GLUCOPHAGE-XR) 500 MG 24 hr tablet Take 1 tablet (500 mg total) by mouth daily with breakfast.     Allergies:   Atorvastatin, Hydralazine, Hydrochlorothiazide, Lisinopril, Pravastatin, and Rosuvastatin   Social History   Socioeconomic History   Marital status: Widowed    Spouse name: Not on file   Number of children: 2   Years of education: Not on file   Highest education level: High school graduate  Occupational History   Not on file  Tobacco Use   Smoking status: Former    Packs/day: 0.50    Years: 30.00    Pack years: 15.00    Types: Cigarettes    Start date: 01/09/1978    Quit date: 03/09/2013    Years since quitting: 7.7   Smokeless tobacco: Never  Vaping Use   Vaping Use: Never used  Substance and Sexual Activity   Alcohol use: Yes    Alcohol/week: 0.0 standard drinks    Comment: Socially    Drug use: No   Sexual activity: Yes    Birth control/protection: Post-menopausal  Other Topics Concern   Not on file  Social History Narrative   Not on file   Social Determinants of Health   Financial Resource Strain: Low Risk    Difficulty of Paying Living Expenses: Not hard at all  Food Insecurity: No Food Insecurity   Worried About Charity fundraiser in the Last Year: Never true   [place] in the Last Year: Never true  Transportation Needs: No Transportation Needs   Lack of Transportation (Medical): No   Lack of Transportation (Non-Medical): No  Physical Activity: Insufficiently Active   Days of Exercise per Week: 2 days   Minutes of Exercise per Session: 20 min  Stress: No Stress Concern Present   Feeling of Stress : Only a little  Social Connections:  Moderately Isolated   Frequency of Communication with Friends and Family: More than three times a week   Frequency of Social Gatherings with Friends and Family: Once a week   Attends Religious Services: 1 to 4 times per year   Active Member of [employer] or Organizations: No   Attends Archivist Meetings: Never   Marital Status: Widowed     Family History: The patient's family history includes Arthritis in her sister; Cancer in her father and maternal grandfather; Diabetes in her father, mother, sister, and sister; Heart attack in her paternal grandfather; Heart disease in her mother, sister, sister, and  sister; Hyperlipidemia in her mother and sister; Hypertension in her father, mother, and sister; Kidney disease in her sister; Thyroid disease in her mother.  ROS:   Please see the history of present illness.     All other systems reviewed and are negative.  EKGs/Labs/Other Studies Reviewed:    EKG:  EKG is  ordered today.  The ekg ordered today demonstrates normal sinus bradycardia.  Recent Labs: 01/23/2020: Magnesium 2.2 04/09/2020: ALT 15; Hemoglobin 13.7; Platelets 236; TSH 2.27 11/01/2020: BUN 9; Creat 0.85; Potassium 4.0; Sodium 139  Recent Lipid Panel    Component Value Date/Time   CHOL 101 11/01/2020 0921   CHOL 184 05/04/2015 0858   TRIG 103 11/01/2020 0921   HDL 39 (L) 11/01/2020 0921   HDL 38 (L) 05/04/2015 0858   CHOLHDL 2.6 11/01/2020 0921   VLDL 21 04/04/2016 0932   LDLCALC 43 11/01/2020 0921    Physical Exam:    VS:  BP 136/70 (BP Location: Left Arm, Patient Position: Sitting, Cuff Size: Large)   Pulse (!) 52   Ht 5\' 4"  (1.626 m)   Wt 257 lb (116.6 kg)   LMP  (LMP Unknown)   SpO2 96%   BMI 44.11 kg/m     Wt Readings from Last 3 Encounters:  12/06/20 257 lb (116.6 kg)  11/01/20 254 lb 6.4 oz (115.4 kg)  04/09/20 253 lb (114.8 kg)     GEN:  Well nourished, well developed in no acute distress, obese HEENT: Normal NECK: No JVD; No carotid  bruits LYMPHATICS: No lymphadenopathy CARDIAC: RRR, no murmurs, rubs, gallops RESPIRATORY:  Clear to auscultation without rales, wheezing or rhonchi  ABDOMEN: Soft, non-tender, non-distended MUSCULOSKELETAL:  No edema; No deformity  SKIN: Warm and dry NEUROLOGIC:  Alert and oriented x 3 PSYCHIATRIC:  Normal affect   ASSESSMENT:    1. Atrial tachycardia ([place])   2. Primary hypertension   3. Coronary artery disease involving native coronary artery of native heart without angina pectoris   4. Pure hypercholesterolemia   5. Morbid obesity ([place])     PLAN:    In order of problems listed above:  Atrial tachycardia, symptoms controlled on carvedilol.  Continue carvedilol 12.5 mg twice daily. hypertension, BP controlled.  Continue previously followed up at [facility].  Amlodipine-valsartan combo, Coreg. CAD/DES to [facility] 2013.  LDL at goal.  EF 60 to 65%,Lexiscan Myoview 2021 showed no ischemia.  Cont Aspirin, Praluent Hyperlipidemia, not tolerant to statins.  cont Praluent. Morbid obesity, low-calorie diet, weight loss recommended.  Follow-up in 12 months.  Total encounter time more than 35 minutes  Greater than 50% was spent in counseling and coordination of care with the patient   This note was generated in part or whole with voice recognition software. Voice recognition is usually quite accurate but there are transcription errors that can and very often do occur. I apologize for any typographical errors that were not detected and corrected.   Medication Adjustments/Labs and Tests Ordered: Current medicines are reviewed at length with the patient today.  Concerns regarding medicines are outlined above.  Orders Placed This Encounter  Procedures   EKG 12-Lead    No orders of the defined types were placed in this encounter.   Patient Instructions  Medication Instructions:   Your physician recommends that you continue on your current medications as directed. Please refer to the Current  Medication list given to you today.  *If you need a refill on your cardiac medications before your next appointment, please call  your pharmacy*   Lab Work:  None ordered  If you have labs (blood work) drawn today and your tests are completely normal, you will receive your results only by: [place] (if you have MyChart) OR A paper copy in the mail If you have any lab test that is abnormal or we need to change your treatment, we will call you to review the results.   Testing/Procedures:  None ordered   Follow-Up: At [facility], you and your health needs are our priority.  As part of our continuing mission to provide you with exceptional heart care, we have created designated Provider Care Teams.  These Care Teams include your primary Cardiologist (physician) and Advanced Practice Providers (APPs -  Physician Assistants and Nurse Practitioners) who all work together to provide you with the care you need, when you need it.  We recommend signing up for the patient portal called "MyChart".  Sign up information is provided on this After Visit Summary.  MyChart is used to connect with patients for Virtual Visits (Telemedicine).  Patients are able to view lab/test results, encounter notes, upcoming appointments, etc.  Non-urgent messages can be sent to your provider as well.   To learn more about what you can do with MyChart, go to [email or web address].    Your next appointment:   1 week(s)  The format for your next appointment:   In Person  Provider:   You may see Elizabeth Sable, MD or one of the following Advanced Practice Providers on your designated Care Team:   Murray Hodgkins, NP Christell Faith, PA-C Cadence Kathlen Mody, [place]    Other Instructions    Signed, Elizabeth Sable, MD  12/06/2020 10:14 AM    [place]

## 2021-02-03 ENCOUNTER — Other Ambulatory Visit: Payer: Self-pay | Admitting: *Deleted

## 2021-02-03 DIAGNOSIS — E785 Hyperlipidemia, unspecified: Secondary | ICD-10-CM

## 2021-02-03 MED ORDER — PRALUENT 75 MG/ML ~~LOC~~ SOAJ: 75.0000 mg | SUBCUTANEOUS | 3 refills | Status: DC

## 2021-05-18 ENCOUNTER — Other Ambulatory Visit: Payer: Self-pay | Admitting: Family Medicine

## 2021-06-03 ENCOUNTER — Encounter: Payer: Self-pay | Admitting: Family Medicine

## 2021-06-03 ENCOUNTER — Ambulatory Visit: Payer: BC Managed Care – PPO | Admitting: Family Medicine

## 2021-06-03 VITALS — BP 130/68 | HR 69 | Temp 98.1°F | Resp 16 | Ht 64.0 in | Wt 245.4 lb

## 2021-06-03 DIAGNOSIS — Z1231 Encounter for screening mammogram for malignant neoplasm of breast: Secondary | ICD-10-CM

## 2021-06-03 DIAGNOSIS — E1169 Type 2 diabetes mellitus with other specified complication: Secondary | ICD-10-CM

## 2021-06-03 DIAGNOSIS — E039 Hypothyroidism, unspecified: Secondary | ICD-10-CM

## 2021-06-03 DIAGNOSIS — R5383 Other fatigue: Secondary | ICD-10-CM

## 2021-06-03 DIAGNOSIS — I10 Essential (primary) hypertension: Secondary | ICD-10-CM

## 2021-06-03 DIAGNOSIS — E669 Obesity, unspecified: Secondary | ICD-10-CM

## 2021-06-03 NOTE — Progress Notes (Signed)
Patient ID: Elizabeth Russo, female    DOB: 17-Feb-1960, 61 y.o.   MRN: [id number]  PCP: Delsa Grana, PA-C  Chief Complaint  Patient presents with   Fatigue    TSH level concerns    Subjective:   Elizabeth Russo is a 61 y.o. female, presents to clinic with CC of the following:  HPI  Pt made appt for concerns of generalized fatigue She has been feeling "sluggish most of the time" even with trying to exercise.  She reports getting adequate sleep going to bed at 9 or 10 PM and waking up at 5 AM getting about 7 to 8 hours of sleep, she does not always feel rested, she does wake up 2-3 times a night to urinate.  She reports that her moods are good she has not had any increase stressors.  She does report some constipation, no hair or skin changes.  She does endorse some shortness of breath with her exercise efforts but no orthopnea, PND, lower extremity edema, palpitations, chest pain She has history of hypothyroid No history of sleep apnea or prior sleep study  Hypothyroidism: History: hypothyroid  -she takes medications correctly first in the morning on empty stomach without other meds or supplements Current Medication Regimen: 150 3 d a week and 137 4 days a week  Current Symptoms: fatigue, feeling cold and cold intolerance, constipation, and feeling slow Most recent results are below; we will be repeating labs today. Lab Results  Component Value Date   TSH 2.27 04/09/2020    She does see Dr. Garen Lah, last OV reviewed she is on amlodipine, valsartan and carvedilol we have noted bradycardia in the past she has not noticed any palpitations, rapid heart rate or to slow heart rate she denies any syncopal episodes Pulse Readings from Last 3 Encounters:  06/03/21 69  12/06/20 (!) 52  11/01/20 86   BP Readings from Last 3 Encounters:  06/03/21 130/68  12/06/20 136/70  11/01/20 132/78   Reoprts mood good, phq and gad7 reviewed and negative    06/03/2021    9:30 AM 11/01/2020     8:33 AM 04/09/2020    8:36 AM  Depression screen PHQ 2/9  Decreased Interest 0 0 0  Down, Depressed, Hopeless 0 0 0  PHQ - 2 Score 0 0 0  Altered sleeping 1 0   Tired, decreased energy 3 0   Change in appetite 0 0   Feeling bad or failure about yourself  0 0   Trouble concentrating 0 0   Moving slowly or fidgety/restless 0 0   Suicidal thoughts 0 0   PHQ-9 Score 4 0   Difficult doing work/chores Not difficult at all Not difficult at all       06/03/2021    9:31 AM  GAD 7 : Generalized Anxiety Score  Nervous, Anxious, on Edge 0  Control/stop worrying 0  Worry too much - different things 0  Trouble relaxing 0  Restless 0  Easily annoyed or irritable 0  Afraid - awful might happen 0  Total GAD 7 Score 0       Patient Active Problem List   Diagnosis Date Noted   S/P cholecystectomy 01/22/2020   Calculus of gallbladder with acute cholecystitis without obstruction    Statin intolerance 03/31/2019   Heart failure with preserved left ventricular function (HFpEF) ([place]) 03/31/2019   Atrial paroxysmal tachycardia ([place]) 03/31/2019   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 with body mass index (BMI)  of 45.0 to 49.9 in adult [facility]) 03/31/2019   Urinary incontinence 03/31/2019   Myalgia due to statin 04/13/2017   Venous stasis 04/13/2017   History of non-ST elevation myocardial infarction (NSTEMI) 02/05/2015   Hx of percutaneous transluminal coronary angioplasty 02/05/2015   Gout of knee 12/25/2014   Chronic combined systolic and diastolic heart failure ([place]) 05/24/2014   Coronary atherosclerosis 12/19/2013   Hyperlipidemia 12/19/2013   Essential hypertension 12/18/2013   Diabetes mellitus type 2 in obese ([place]) 12/18/2013   Hypothyroidism 12/18/2013   Dyslipidemia 02/13/2011   History of tobacco use 02/13/2011      Current Outpatient Medications:    Alirocumab (PRALUENT) 75 MG/ML SOAJ, Inject 75 mg into the skin every 14 (fourteen) days., Disp: 6 mL, Rfl:  3   amLODipine-valsartan (EXFORGE) 10-160 MG tablet, Take 1 tablet by mouth daily., Disp: 90 tablet, Rfl: 3   aspirin EC 81 MG tablet, Take 81 mg by mouth daily. , Disp: , Rfl:    carvedilol (COREG) 25 MG tablet, Take 0.5 tablets (12.5 mg total) by mouth Two (2) times a day with meals., Disp: 90 tablet, Rfl: 3   clotrimazole-betamethasone (LOTRISONE) cream, Apply to affected area 2 times daily prn for skin fold rashes, Disp: 15 g, Rfl: 3   diclofenac sodium (VOLTAREN) 1 % GEL, Apply 2 g topically daily as needed (pain)., Disp: , Rfl:    Dulaglutide (TRULICITY) 1.5 NU/2.7OZ SOPN, Inject 1.5 mg into the skin once a week., Disp: 6 mL, Rfl: 3   famotidine (PEPCID) 20 MG tablet, Take 1 tablet (20 mg total) by mouth 2 (two) times daily., Disp: 180 tablet, Rfl: 3   fluticasone (FLONASE) 50 MCG/ACT nasal spray, Place 2 sprays into both nostrils daily., Disp: 16 g, Rfl: 2   furosemide (LASIX) 20 MG tablet, Take when needed for Edema., Disp: 90 tablet, Rfl: 3   glucose blood test strip, Test tid, Disp: 100 each, Rfl: 12   Insulin Pen Needle 32G X 4 MM MISC, Use as directed, Disp: 100 each, Rfl: PRN   levothyroxine (SYNTHROID) 150 MCG tablet, Take 1 tablet by mouth once daily on Monday, Wednesday and Friday and take 137 mcg on the other days., Disp: 78 tablet, Rfl: 1   meclizine (ANTIVERT) 25 MG tablet, Take 1 tablet (25 mg total) by mouth Three (3) times a day as needed for dizziness., Disp: 30 tablet, Rfl: 1   metFORMIN (GLUCOPHAGE-XR) 500 MG 24 hr tablet, Take 1 tablet (500 mg total) by mouth daily with breakfast., Disp: 90 tablet, Rfl: 3   Allergies  Allergen Reactions   Atorvastatin Other (See Comments)    Other reaction(s): Muscle Pain   Hydralazine Other (See Comments)    Aggravated gout Aggravated gout   Hydrochlorothiazide Other (See Comments)    Aggravated gout   Lisinopril Cough   Pravastatin Other (See Comments)    Other reaction(s): Muscle Pain   Rosuvastatin Other (See Comments)     Other reaction(s): Muscle Pain     Social History   Tobacco Use   Smoking status: Former    Packs/day: 0.50    Years: 30.00    Pack years: 15.00    Types: Cigarettes    Start date: 01/09/1978    Quit date: 03/09/2013    Years since quitting: 8.2   Smokeless tobacco: Never  Vaping Use   Vaping Use: Never used  Substance Use Topics   Alcohol use: Yes    Alcohol/week: 0.0 standard drinks    Comment: Socially  Drug use: No      Chart Review Today: I personally reviewed active problem list, medication list, allergies, family history, social history, health maintenance, notes from last encounter, lab results, imaging with the patient/caregiver today.   Review of Systems  Constitutional: Negative.   HENT: Negative.    Eyes: Negative.   Respiratory: Negative.    Cardiovascular: Negative.   Gastrointestinal: Negative.   Endocrine: Negative.   Genitourinary: Negative.   Musculoskeletal: Negative.   Skin: Negative.   Allergic/Immunologic: Negative.   Neurological: Negative.   Hematological: Negative.   Psychiatric/Behavioral: Negative.    All other systems reviewed and are negative.     Objective:   Vitals:   06/03/21 0927  BP: 130/68  Pulse: 69  Resp: 16  Temp: 98.1 F (36.7 C)  TempSrc: Oral  SpO2: 98%  Weight: 245 lb 6.4 oz (111.3 kg)  Height: '5\' 4"'$  (1.626 m)    Body mass index is 42.12 kg/m.  Physical Exam Vitals and nursing note reviewed.  Constitutional:      General: She is not in acute distress.    Appearance: She is well-developed. She is obese. She is not ill-appearing, toxic-appearing or diaphoretic.  HENT:     Head: Normocephalic and atraumatic.     Right Ear: External ear normal.     Left Ear: External ear normal.     Nose: Nose normal.  Eyes:     General:        Right eye: No discharge.        Left eye: No discharge.     Conjunctiva/sclera: Conjunctivae normal.  Neck:     Trachea: No tracheal deviation.  Cardiovascular:     Rate and  Rhythm: Normal rate and regular rhythm.     Pulses: Normal pulses.     Heart sounds: Normal heart sounds. No murmur heard.   No friction rub. No gallop.  Pulmonary:     Effort: Pulmonary effort is normal. No respiratory distress.     Breath sounds: No stridor.  Skin:    General: Skin is warm and dry.     Findings: No rash.  Neurological:     Mental Status: She is alert.     Motor: No abnormal muscle tone.     Coordination: Coordination normal.  Psychiatric:        Mood and Affect: Mood normal.        Behavior: Behavior normal.     Results for orders placed or performed in visit on 11/01/20  Lipid panel  Result Value Ref Range   Cholesterol 101 <200 mg/dL   HDL 39 (L) > OR = 50 mg/dL   Triglycerides 103 <150 mg/dL   LDL Cholesterol (Calc) 43 mg/dL (calc)   Total CHOL/HDL Ratio 2.6 <5.0 (calc)   Non-HDL Cholesterol (Calc) 62 <130 mg/dL (calc)  Microalbumin / creatinine urine ratio  Result Value Ref Range   Creatinine, Urine 88 20 - 275 mg/dL   Microalb, Ur 0.4 mg/dL   Microalb Creat Ratio 5 <30 mcg/mg creat  Basic metabolic panel  Result Value Ref Range   Glucose, Bld 100 (H) 65 - 99 mg/dL   BUN 9 7 - 25 mg/dL   Creat 0.85 0.50 - 1.03 mg/dL   BUN/Creatinine Ratio NOT APPLICABLE 6 - 22 (calc)   Sodium 139 135 - 146 mmol/L   Potassium 4.0 3.5 - 5.3 mmol/L   Chloride 107 98 - 110 mmol/L   CO2 25 20 - 32 mmol/L  Calcium 8.8 8.6 - 10.4 mg/dL  Hemoglobin A1c  Result Value Ref Range   Hgb A1c MFr Bld 5.8 (H) <5.7 % of total Hgb   Mean Plasma Glucose 120 mg/dL   eAG (mmol/L) 6.6 mmol/L  Urine cytology ancillary only  Result Value Ref Range   Neisseria Gonorrhea Negative    Chlamydia Negative    Trichomonas Negative    Comment Normal Reference [place] Chlamydia - Negative    Comment      Normal Reference Range Neisseria Gonorrhea - Negative   Comment Normal Reference Range Trichomonas - Negative        Assessment & Plan:   1. Fatigue, unspecified type Generalized  fatigue with slightly inadequate sleep regularly at night which is interrupted multiple times by having to urinate, she does not always feel rested, she is deconditioned but is trying to exercise having some fatigue and shortness of breath associated with those efforts. We will recheck TSH she is on high doses of levothyroxine if TSH increases to high range of normal we may be able to adjust those medicines Did ESS scale today consider sleep study Will rule out anemia and electrolyte derangement Sound like her blood sugars are well controlled she is on 500 mg once daily metformin I am not concerned about uncontrolled diabetes Her PHQ-9 and GAD-7 are negative and she reports having good mood no increased stress May also want to consider monitoring her heart rate I encouraged her to check her pulse occasionally especially when she is feeling very sluggish or fatigued and see if she is bradycardic she is on carvedilol 25 mg cut in half and taken twice a day we may want to ensure that this is not causing bradycardia or causing her symptoms She also has a history of heart failure may be able to follow-up on CHF with cardiology she currently appears euvolemic and does not have a concerning cardiac symptoms other than generalized fatigue and some mild shortness of breath with activity her blood pressure and heart rate are normal today   - TSH - COMPLETE METABOLIC PANEL WITH GFR - CBC with Differential  2. Diabetes mellitus type 2 in obese [facility]) Has been well controlled - COMPLETE METABOLIC PANEL WITH GFR - CBC with Differential - HgB A1c  3. Essential hypertension On multiple medications, cardiology managing some of them, her blood pressure is at goal today - COMPLETE METABOLIC PANEL WITH GFR - CBC with Differential  4. Hypothyroidism, unspecified type See above she is having some symptoms which could be chemical hypothyroid including fatigue, constipation, feeling cold easily - TSH - COMPLETE  METABOLIC PANEL WITH GFR - CBC with Differential  5. Encounter for screening mammogram for malignant neoplasm of breast  - MM Digital Screening; Future      Delsa Grana, PA-C 06/03/21 9:46 AM

## 2021-06-04 LAB — COMPLETE METABOLIC PANEL WITH GFR
AG Ratio: 1.2 (calc) (ref 1.0–2.5)
ALT: 15 U/L (ref 6–29)
AST: 17 U/L (ref 10–35)
Albumin: 4 g/dL (ref 3.6–5.1)
Alkaline phosphatase (APISO): 72 U/L (ref 37–153)
BUN: 9 mg/dL (ref 7–25)
CO2: 25 mmol/L (ref 20–32)
Calcium: 9.1 mg/dL (ref 8.6–10.4)
Chloride: 105 mmol/L (ref 98–110)
Creat: 0.85 mg/dL (ref 0.50–1.05)
Globulin: 3.3 g/dL (calc) (ref 1.9–3.7)
Glucose, Bld: 90 mg/dL (ref 65–99)
Potassium: 4.1 mmol/L (ref 3.5–5.3)
Sodium: 139 mmol/L (ref 135–146)
Total Bilirubin: 0.4 mg/dL (ref 0.2–1.2)
Total Protein: 7.3 g/dL (ref 6.1–8.1)
eGFR: 78 mL/min/{1.73_m2} (ref >=60)

## 2021-06-04 LAB — CBC WITH DIFFERENTIAL/PLATELET
Absolute Monocytes: 479 cells/uL (ref 200–950)
Basophils Absolute: 61 cells/uL (ref 0–200)
Basophils Relative: 1.2 %
Eosinophils Absolute: 158 cells/uL (ref 15–500)
Eosinophils Relative: 3.1 %
HCT: 42.1 % (ref 35.0–45.0)
Hemoglobin: 14 g/dL (ref 11.7–15.5)
Lymphs Abs: 2356 cells/uL (ref 850–3900)
MCH: 29.5 pg (ref 27.0–33.0)
MCHC: 33.3 g/dL (ref 32.0–36.0)
MCV: 88.8 fL (ref 80.0–100.0)
MPV: 9.6 fL (ref 7.5–12.5)
Monocytes Relative: 9.4 %
Neutro Abs: 2045 cells/uL (ref 1500–7800)
Neutrophils Relative %: 40.1 %
Platelets: 231 10*3/uL (ref 140–400)
RBC: 4.74 10*6/uL (ref 3.80–5.10)
RDW: 12.8 % (ref 11.0–15.0)
Total Lymphocyte: 46.2 %
WBC: 5.1 10*3/uL (ref 3.8–10.8)

## 2021-06-04 LAB — TSH: TSH: 2.66 mIU/L (ref 0.40–4.50)

## 2021-06-04 LAB — HEMOGLOBIN A1C
Hgb A1c MFr Bld: 5.7 % of total Hgb — ABNORMAL HIGH (ref <5.7)
Mean Plasma Glucose: 117 mg/dL
eAG (mmol/L): 6.5 mmol/L

## 2021-06-08 ENCOUNTER — Telehealth: Payer: Self-pay

## 2021-06-08 NOTE — Telephone Encounter (Signed)
Pt returned our call - Shared provider's result note with pt. No questions at this time.  Delsa Grana, PA-C  06/07/2021  3:56 PM EDT     Please notify pt of negative labwork Labs look great, A1C went down a little more, thyroid is normal, kidney/liver function, electrolytes normal, no anemia

## 2021-07-13 LAB — HM DIABETES EYE EXAM

## 2021-07-31 IMAGING — US US ABDOMEN LIMITED
1 series · 14 of 25 positions shown · non-contrast
Comparison: Ultrasound 01/10/2013

CLINICAL DATA: Right upper quadrant abdominal pain

EXAM:
ULTRASOUND ABDOMEN LIMITED RIGHT UPPER QUADRANT

[Series 1: us abdomen limited ruq (liver/gb) · 14 of 43 slices shown]
[im 1/43]
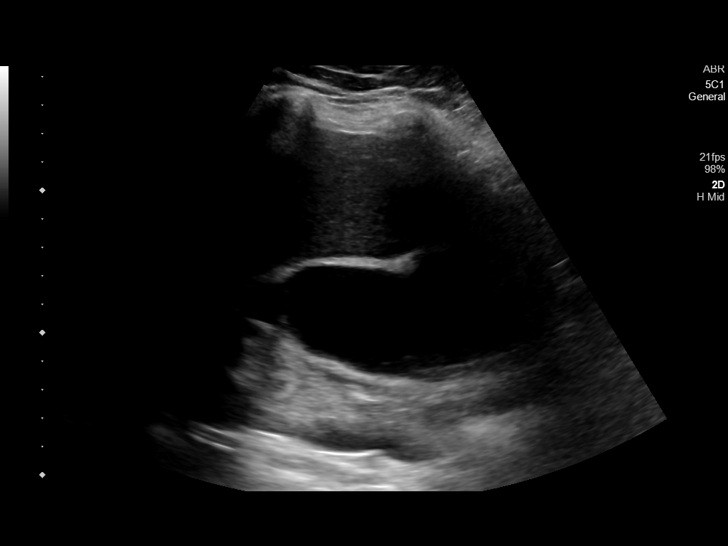
[im 4/43]
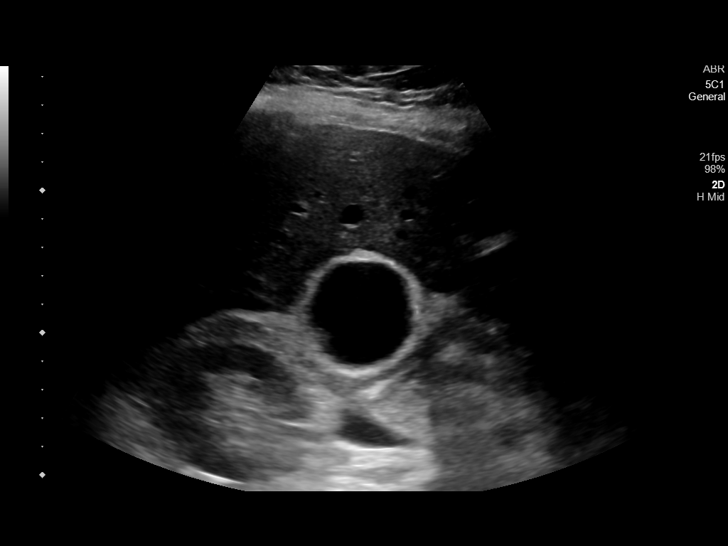
[im 8/43]
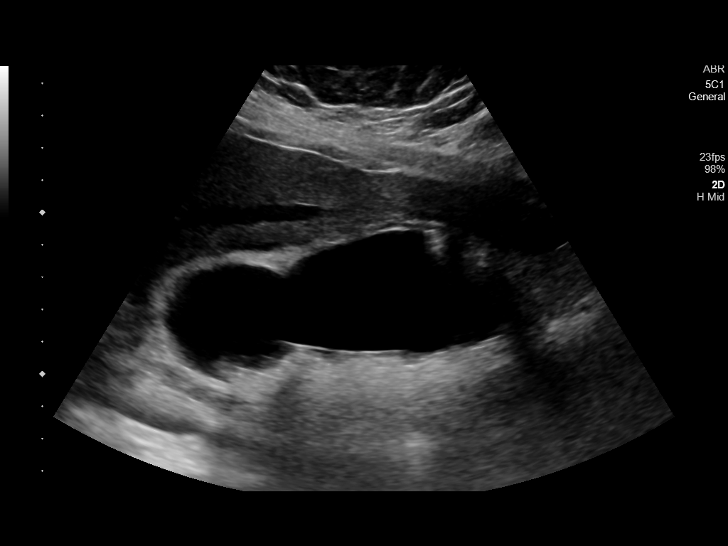
[im 11/43]
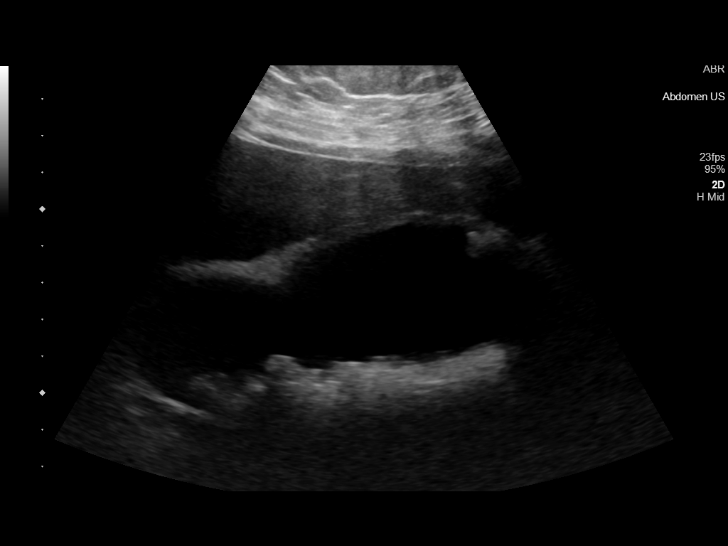
[im 15/43]
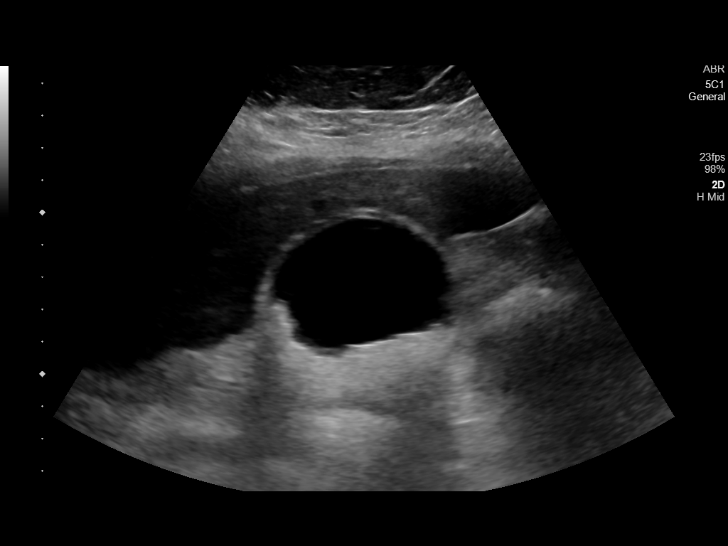
[im 16/43]
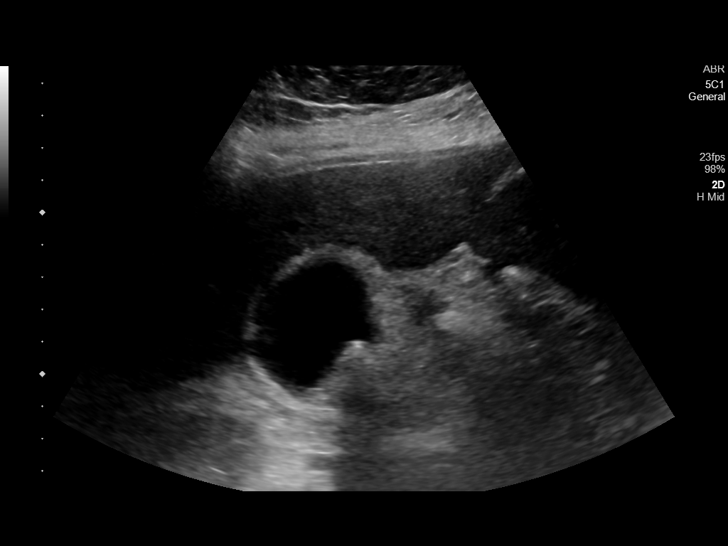
[im 20/43]
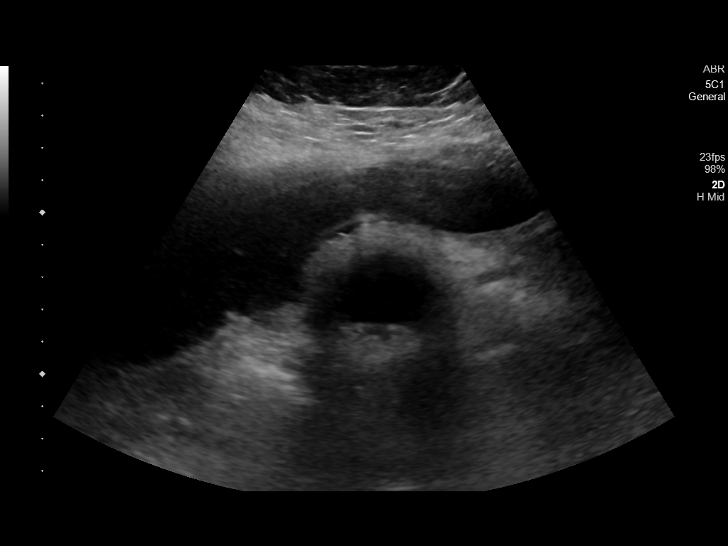
[im 23/43]
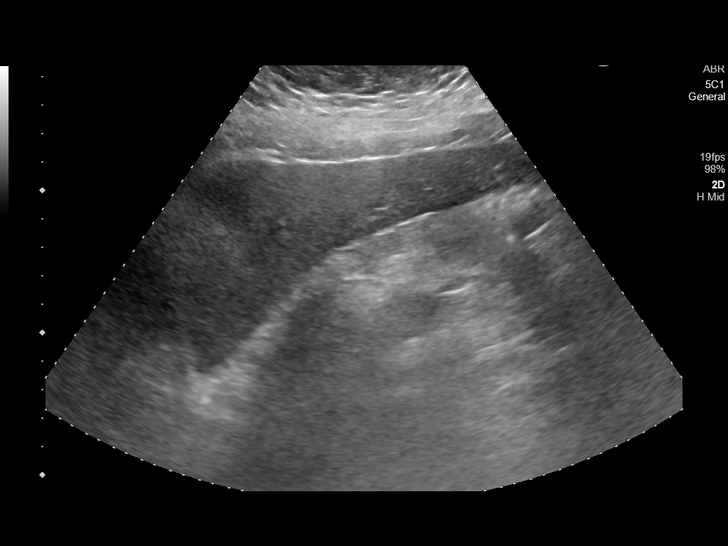
[im 27/43]
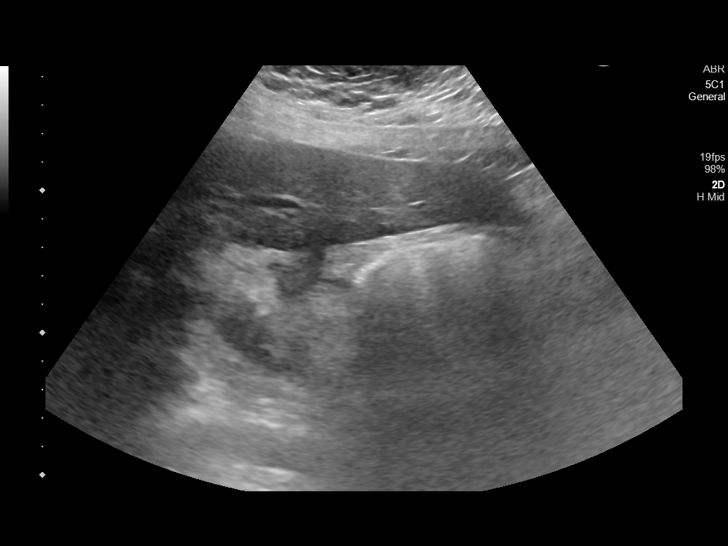
[im 29/43]
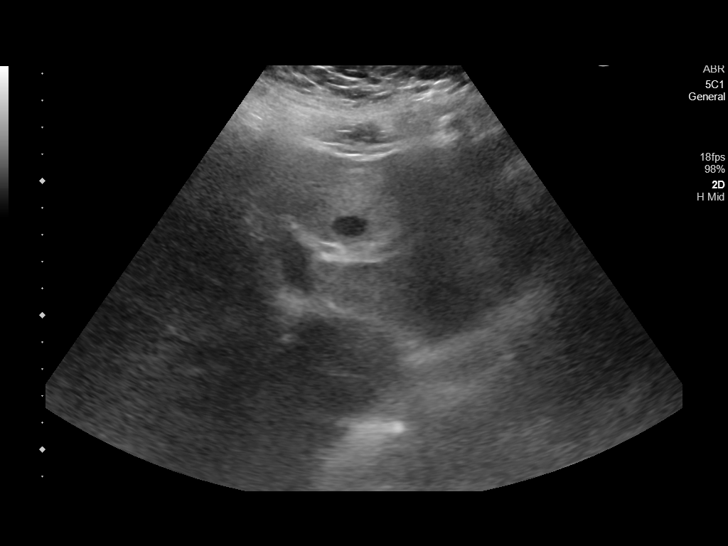
[im 32/43]
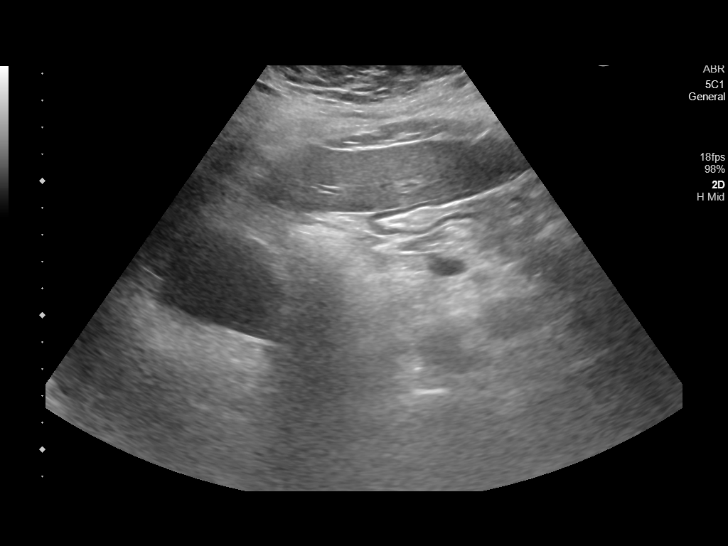
[im 36/43]
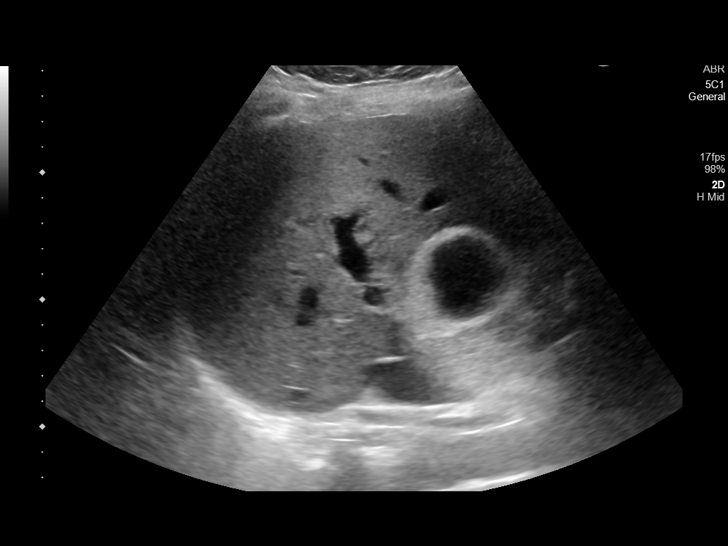
[im 39/43]
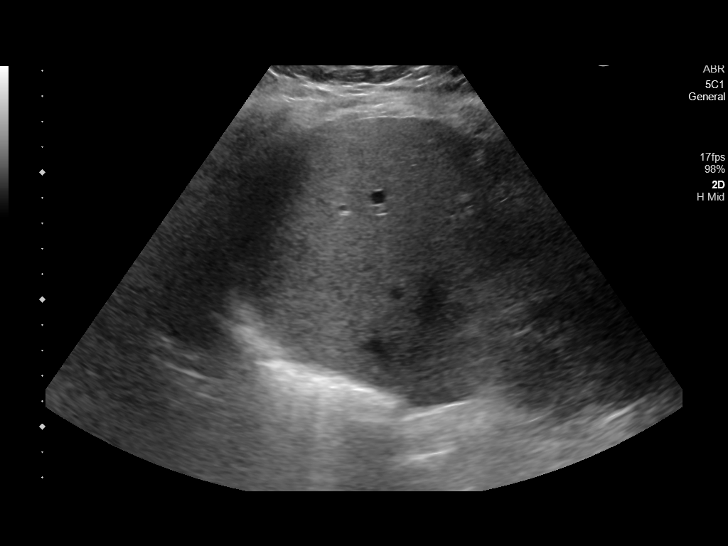
[im 43/43]
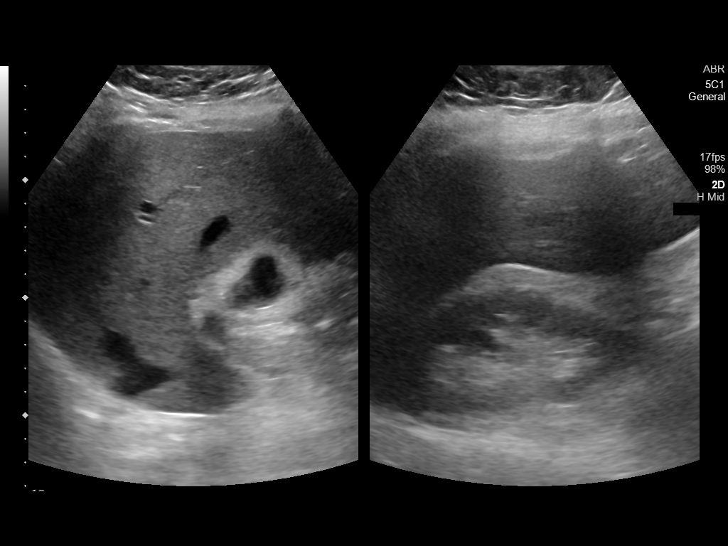

[14 of 25 positions shown; findings below may reference images not displayed]

FINDINGS: Gallbladder:

Numerous small layering gallstones in the gallbladder with
associated acoustic shadowing. The largest calculus measures 4 mm.
There is also gallbladder wall thickening and a small amount of
pericholecystic fluid suggesting acute cholecystitis. Negative
sonographic Murphy sign.

Common bile duct:

Diameter: 3.0 mm

Liver:

There is diffuse increased echogenicity of the liver and decreased
through transmission consistent with fatty infiltration. No focal
lesions or biliary dilatation. Portal vein is patent on color
Doppler imaging with normal direction of blood flow towards the
liver.

Other: None.
IMPRESSION: 1. Cholelithiasis along with gallbladder wall thickening and
pericholecystic fluid suggesting acute cholecystitis. Nuclear
medicine hepatobiliary scan may be helpful for further evaluation.
2. Fatty infiltration of the liver.
3. No intra or extrahepatic biliary dilatation.

## 2021-08-17 ENCOUNTER — Other Ambulatory Visit: Payer: Self-pay

## 2021-08-17 MED ORDER — REPATHA 140 MG/ML ~~LOC~~ SOSY: 140.0000 mg | PREFILLED_SYRINGE | SUBCUTANEOUS | 0 refills | Status: DC

## 2021-08-17 NOTE — Telephone Encounter (Signed)
Fax received from [place] is no longer covered by insurance. "Must use Repatha". Patient overdue for 1 week F/U appointment. Please schedule. Thank you!

## 2021-08-17 NOTE — Telephone Encounter (Signed)
Patient AVS at last visit states 1 week but recall was placed for 1 year.  Please clarify if patient is overdue or due in November.

## 2021-08-24 ENCOUNTER — Other Ambulatory Visit: Payer: Self-pay | Admitting: Family Medicine

## 2021-08-24 DIAGNOSIS — Z760 Encounter for issue of repeat prescription: Secondary | ICD-10-CM

## 2021-08-24 DIAGNOSIS — H6982 Other specified disorders of Eustachian tube, left ear: Secondary | ICD-10-CM

## 2021-08-24 DIAGNOSIS — R0981 Nasal congestion: Secondary | ICD-10-CM

## 2021-08-25 NOTE — Telephone Encounter (Signed)
Requested Prescriptions  Pending Prescriptions Disp Refills  . fluticasone (FLONASE) 50 MCG/ACT nasal spray [Pharmacy Med Name: fluticasone propionate 50 mcg/actuation nasal spray,suspension (FLONASE)] 16 g 2    Sig: Place 2 sprays into both nostrils daily.     Ear, Nose, and Throat: Nasal Preparations - Corticosteroids Passed - 08/24/2021 12:47 PM      Passed - Valid encounter within last 12 months    Recent Outpatient Visits          2 months ago Fatigue, unspecified type   [facility] Delsa Grana, PA-C   9 months ago Essential hypertension   [place], DO   1 year ago Diabetes mellitus type 2 in obese [facility])   [facility] Delsa Grana, [facility]-C   1 year ago Preoperative examination   [facility] Delsa Gr[facility], PA-C   1 year ago Pain of upper abdomen   [facility] Delsa Grana, PA-C      Future Appointments            In 3 months Agbor-Etang, Aaron Edelman, MD [facility], [employer]

## 2021-08-29 ENCOUNTER — Telehealth: Payer: Self-pay | Admitting: Cardiology

## 2021-08-29 ENCOUNTER — Other Ambulatory Visit: Payer: Self-pay

## 2021-08-29 MED ORDER — REPATHA 140 MG/ML ~~LOC~~ SOSY: 140.0000 mg | PREFILLED_SYRINGE | SUBCUTANEOUS | 2 refills | Status: DC

## 2021-08-29 NOTE — Telephone Encounter (Signed)
I did not need this encounter. °

## 2021-10-12 ENCOUNTER — Telehealth: Payer: Self-pay

## 2021-10-12 NOTE — Telephone Encounter (Signed)
Notified Maryan Char with [employer] that the patient is taking Repatha 140 MG/ML and not Praluent. Please disregard the fax that was faxed to our office for a prior authorization for Praluent.

## 2021-10-20 ENCOUNTER — Other Ambulatory Visit: Payer: Self-pay | Admitting: Family Medicine

## 2021-10-20 DIAGNOSIS — K219 Gastro-esophageal reflux disease without esophagitis: Secondary | ICD-10-CM

## 2021-10-20 NOTE — Telephone Encounter (Signed)
Requested Prescriptions  Pending Prescriptions Disp Refills  . TRULICITY 1.5 OZ/3.6UY SOPN [Pharmacy Med Name: Trulicity 1.5 QI/3.4 mL subcutaneous pen injector (dulaglutide)] 6 mL 0    Sig: Inject 1.5 mg into the skin once a week.     Endocrinology:  Diabetes - GLP-1 Receptor Agonists Passed - 10/20/2021  6:24 AM      Passed - HBA1C is between 0 and 7.9 and within 180 days    HbA1c, POC (prediabetic range)  Date Value Ref Range Status  04/22/2018 5.9 5.7 - 6.4 % Final   HbA1c, POC (controlled diabetic range)  Date Value Ref Range Status  04/22/2018 5.9 0.0 - 7.0 % Final   HbA1c POC (<> result, manual entry)  Date Value Ref Range Status  04/22/2018 5.9 4.0 - 5.6 % Final   Hgb A1c MFr Bld  Date Value Ref Range Status  06/03/2021 5.7 (H) <5.7 % of total Hgb Final    Comment:    For someone without known diabetes, a hemoglobin  A1c value between 5.7% and 6.4% is consistent with prediabetes and should be confirmed with a  follow-up test. . For someone with known diabetes, a value <7% indicates that their diabetes is well controlled. A1c targets should be individualized based on duration of diabetes, age, comorbid conditions, and other considerations. . This assay result is consistent with an increased risk of diabetes. . Currently, no consensus exists regarding use of hemoglobin A1c for diagnosis of diabetes for children. Renella Cunas - Valid encounter within last 6 months    Recent Outpatient Visits          4 months ago Fatigue, unspecified type   [facility] Delsa Grana, PA-C   11 months ago Essential hypertension   [place], DO   1 year ago Diabetes mellitus type 2 in obese [facility])   [facility] Delsa Grana, PA-C   1 year ago Preoperative examination   [facility] Delsa Grana, PA-C   1 year ago Pain of upper abdomen   [facility] Delsa Grana, PA-C      Future Appointments            In 1 month Kate Sable, MD [place]. [place]

## 2021-11-01 ENCOUNTER — Other Ambulatory Visit: Payer: Self-pay | Admitting: Family Medicine

## 2021-11-07 LAB — HM MAMMOGRAPHY

## 2021-11-21 ENCOUNTER — Other Ambulatory Visit: Payer: Self-pay | Admitting: Family Medicine

## 2021-11-21 DIAGNOSIS — I480 Paroxysmal atrial fibrillation: Secondary | ICD-10-CM

## 2021-11-21 DIAGNOSIS — I10 Essential (primary) hypertension: Secondary | ICD-10-CM

## 2021-11-22 NOTE — Telephone Encounter (Signed)
Requested Prescriptions  Pending Prescriptions Disp Refills   amLODipine-valsartan (EXFORGE) 10-160 MG tablet [Pharmacy Med Name: amLODIPine 10 mg-valsartan 160 mg tablet (EXFORGE)] 90 tablet 0    Sig: Take 1 tablet by mouth daily.     Cardiovascular: CCB + ARB Combos Passed - 11/21/2021  6:08 AM      Passed - K in normal range and within 180 days    Potassium  Date Value Ref Range Status  06/03/2021 4.1 3.5 - 5.3 mmol/L Final  01/19/2011 3.3 (L) 3.5 - 5.1 mmol/L Final         Passed - Cr in normal range and within 180 days    Creat  Date Value Ref Range Status  06/03/2021 0.85 0.50 - 1.05 mg/dL Final   Creatinine,U  Date Value Ref Range Status  12/18/2013 109.3 mg/dL Final   Creatinine, Urine  Date Value Ref Range Status  11/01/2020 88 20 - 275 mg/dL Final         Passed - Na in normal range and within 180 days    Sodium  Date Value Ref Range Status  06/03/2021 139 135 - 146 mmol/L Final  05/04/2015 139 134 - 144 mmol/L Final  01/19/2011 142 136 - 145 mmol/L Final         Passed - Patient is not pregnant      Passed - Last BP in normal range    BP Readings from Last 1 Encounters:  06/03/21 130/68         Passed - Valid encounter within last 6 months    Recent Outpatient Visits           5 months ago Fatigue, unspecified type   [facility] Delsa Grana, PA-C   1 year ago Essential hypertension   [place], [place], DO   1 year ago Diabetes mellitus type 2 in obese [facility])   [facility] Delsa Grana, PA-C   1 year ago Preoperative examination   [facility] Delsa Grana, PA-C   1 year ago Pain of upper abdomen   [facility] Delsa Grana, PA-C       Future Appointments             In 2 weeks Agbor-Etang, Aaron Edelman, MD [place]             carvedilol (COREG) 25 MG tablet [Pharmacy Med Name:  carvediloL 25 mg tablet (COREG)] 90 tablet 0    Sig: Take 0.5 tablet (12.5 mg total) by mouth Two (2) times a day with meals.     Cardiovascular: Beta Blockers 3 Passed - 11/21/2021  6:08 AM      Passed - Cr in normal range and within 360 days    Creat  Date Value Ref Range Status  06/03/2021 0.85 0.50 - 1.05 mg/dL Final   Creatinine,U  Date Value Ref Range Status  12/18/2013 109.3 mg/dL Final   Creatinine, Urine  Date Value Ref Range Status  11/01/2020 88 20 - 275 mg/dL Final         Passed - AST in normal range and within 360 days    AST  Date Value Ref Range Status  06/03/2021 17 10 - 35 U/L Final         Passed - ALT in normal range and within 360 days    ALT  Date Value Ref Range Status  06/03/2021 15 6 - 29 U/L Final         Passed - Last BP in normal range    BP Readings from Last 1 Encounters:  06/03/21 130/68         Passed - Last Heart Rate in normal range    Pulse Readings from Last 1 Encounters:  06/03/21 69         Passed - Valid encounter within last 6 months    Recent Outpatient Visits           5 months ago Fatigue, unspecified type   [facility] Delsa Grana, PA-C   1 year ago Essential hypertension   [place], DO   1 year ago Diabetes mellitus type 2 in obese [facility])   [facility] Delsa Grana, PA-C   1 year ago Preoperative examination   [facility] Delsa Grana, PA-C   1 year ago Pain of upper abdomen   [facility] Delsa Grana, PA-C       Future Appointments             In 2 weeks Agbor-Etang, Aaron Edelman, MD [place]. [place]

## 2021-11-22 NOTE — Progress Notes (Unsigned)
Patient ID: ANJELA Russo, female    DOB: [birth year], 61 y.o.   MRN: [id number]  PCP: Delsa Grana, PA-C  No chief complaint on file.   Subjective:   Elizabeth Russo is a 61 y.o. 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 ([place]) 03/31/2019   Urinary incontinence 03/31/2019   Myalgia due to statin 04/13/2017   Venous stasis 04/13/2017   History of non-ST elevation myocardial infarction (NSTEMI) 02/05/2015   Hx of percutaneous transluminal coronary angioplasty 02/05/2015   Gout of knee 12/25/2014   Chronic combined systolic and diastolic heart failure ([place]) 05/24/2014   Coronary atherosclerosis 12/19/2013   Hyperlipidemia 12/19/2013   Essential hypertension 12/18/2013   Diabetes mellitus type 2 in obese ([place]) 12/18/2013   Hypothyroidism 12/18/2013   Dyslipidemia 02/13/2011   History of tobacco use 02/13/2011      Current Outpatient Medications:    amLODipine-valsartan (EXFORGE) 10-160 MG tablet, Take 1 tablet by mouth daily., Disp: 90 tablet, Rfl: 0   aspirin EC 81 MG tablet, Take 81 mg by mouth daily. , Disp: , Rfl:    carvedilol (COREG) 25 MG tablet, Take 0.5 tablet (12.5 mg total) by mouth Two (2) times a day with meals., Disp: 90 tablet, Rfl: 0   clotrimazole-betamethasone (LOTRISONE) cream, Apply to affected area 2 times daily prn for skin fold rashes, Disp: 15 g, Rfl: 3   diclofenac sodium (VOLTAREN) 1 % GEL, Apply 2 g topically daily as needed (pain)., Disp: , Rfl:    Evolocumab (REPATHA) 140 MG/ML SOSY, Inject 140  mg into the skin every 14 (fourteen) days., Disp: 2 mL, Rfl: 2   famotidine (PEPCID) 20 MG tablet, Take 1 tablet (20 mg total) by mouth Two (2) times a day., Disp: 180 tablet, Rfl: 3   fluticasone (FLONASE) 50 MCG/ACT nasal spray, Place 2 sprays into both nostrils daily., Disp: 16 g, Rfl: 1   furosemide (LASIX) 20 MG tablet, Take when needed for Edema., Disp: 90 tablet, Rfl: 3   glucose blood test strip, Test tid, Disp: 100 each, Rfl: 12   Insulin Pen Needle 32G X 4 MM MISC, Use as directed, Disp: 100 each, Rfl: PRN   levothyroxine (SYNTHROID) 150 MCG tablet, Take 1 tablet by mouth once daily on Monday, Wednesday and Friday and take 137 mcg on the other days., Disp: 78 tablet, Rfl: 1   meclizine (ANTIVERT) 25 MG tablet, Take 1 tablet (25 mg total) by mouth Three (3) times a day as needed for dizziness., Disp: 30 tablet, Rfl: 1   metFORMIN (GLUCOPHAGE-XR) 500 MG 24 hr tablet, Take 1 tablet (500 mg total) by mouth daily with breakfast., Disp: 90 tablet, Rfl: 3   TRULICITY 1.5 YQ/6.5HQ SOPN, Inject 1.5 mg into the skin once a week., Disp: 6 mL, Rfl: 0   Allergies  Allergen Reactions   Atorvastatin Other (See Comments)    Other reaction(s): Muscle Pain   Hydralazine Other (See Comments)    Aggravated gout Aggravated gout   Hydrochlorothiazide Other (See Comments)    Aggravated gout  Lisinopril Cough   Pravastatin Other (See Comments)    Other reaction(s): Muscle Pain   Rosuvastatin Other (See Comments)    Other reaction(s): Muscle Pain     Social History   Tobacco Use   Smoking status: Former    Packs/day: 0.50    Years: 30.00    Total pack years: 15.00    Types: Cigarettes    Start date: 01/09/1978    Quit date: 03/09/2013    Years since quitting: 8.7   Smokeless tobacco: Never  Vaping Use   Vaping Use: Never used  Substance Use Topics   Alcohol use: Yes    Alcohol/week: 0.0 standard drinks of alcohol    Comment: Socially    Drug use: No      Chart Review  Today: ***  Review of Systems  Musculoskeletal:  Positive for back pain.       Objective:   There were no vitals filed for this visit.  There is no height or weight on file to calculate BMI.  Physical Exam   Results for orders placed or performed in visit on 06/03/21  TSH  Result Value Ref Range   TSH 2.66 0.40 - 4.50 mIU/L  COMPLETE METABOLIC PANEL WITH GFR  Result Value Ref Range   Glucose, Bld 90 65 - 99 mg/dL   BUN 9 7 - 25 mg/dL   Creat 0.85 0.50 - 1.05 mg/dL   eGFR 78 > OR = 60 mL/min/1.41m   BUN/Creatinine Ratio NOT APPLICABLE 6 - 22 (calc)   Sodium 139 135 - 146 mmol/L   Potassium 4.1 3.5 - 5.3 mmol/L   Chloride 105 98 - 110 mmol/L   CO2 25 20 - 32 mmol/L   Calcium 9.1 8.6 - 10.4 mg/dL   Total Protein 7.3 6.1 - 8.1 g/dL   Albumin 4.0 3.6 - 5.1 g/dL   Globulin 3.3 1.9 - 3.7 g/dL (calc)   AG Ratio 1.2 1.0 - 2.5 (calc)   Total Bilirubin 0.4 0.2 - 1.2 mg/dL   Alkaline phosphatase (APISO) 72 37 - 153 U/L   AST 17 10 - 35 U/L   ALT 15 6 - 29 U/L  CBC with Differential  Result Value Ref Range   WBC 5.1 3.8 - 10.8 Thousand/uL   RBC 4.74 3.80 - 5.10 Million/uL   Hemoglobin 14.0 11.7 - 15.5 g/dL   HCT 42.1 35.0 - 45.0 %   MCV 88.8 80.0 - 100.0 fL   MCH 29.5 27.0 - 33.0 pg   MCHC 33.3 32.0 - 36.0 g/dL   RDW 12.8 11.0 - 15.0 %   Platelets 231 140 - 400 Thousand/uL   MPV 9.6 7.5 - 12.5 fL   Neutro Abs 2,045 1,500 - 7,800 cells/uL   Lymphs Abs 2,356 850 - 3,900 cells/uL   Absolute Monocytes 479 200 - 950 cells/uL   Eosinophils Absolute 158 15 - 500 cells/uL   Basophils Absolute 61 0 - 200 cells/uL   Neutrophils Relative % 40.1 %   Total Lymphocyte 46.2 %   Monocytes Relative 9.4 %   Eosinophils Relative 3.1 %   Basophils Relative 1.2 %  HgB A1c  Result Value Ref Range   Hgb A1c MFr Bld 5.7 (H) <5.7 % of total Hgb   Mean Plasma Glucose 117 mg/dL   eAG (mmol/L) 6.5 mmol/L       Assessment & Plan:   ***     LDelsa Grana PA-C 11/22/21 4:54 PM

## 2021-11-23 ENCOUNTER — Encounter: Payer: Self-pay | Admitting: Family Medicine

## 2021-11-23 ENCOUNTER — Ambulatory Visit: Payer: BC Managed Care – PPO | Admitting: Family Medicine

## 2021-11-23 VITALS — BP 138/76 | HR 67 | Temp 98.3°F | Resp 16 | Ht 64.0 in | Wt 246.5 lb

## 2021-11-23 DIAGNOSIS — E782 Mixed hyperlipidemia: Secondary | ICD-10-CM

## 2021-11-23 DIAGNOSIS — M47818 Spondylosis without myelopathy or radiculopathy, sacral and sacrococcygeal region: Secondary | ICD-10-CM

## 2021-11-23 DIAGNOSIS — E669 Obesity, unspecified: Secondary | ICD-10-CM

## 2021-11-23 DIAGNOSIS — E039 Hypothyroidism, unspecified: Secondary | ICD-10-CM

## 2021-11-23 DIAGNOSIS — M5136 Other intervertebral disc degeneration, lumbar region: Secondary | ICD-10-CM

## 2021-11-23 DIAGNOSIS — Z5181 Encounter for therapeutic drug level monitoring: Secondary | ICD-10-CM

## 2021-11-23 DIAGNOSIS — I5030 Unspecified diastolic (congestive) heart failure: Secondary | ICD-10-CM

## 2021-11-23 DIAGNOSIS — R102 Pelvic and perineal pain: Secondary | ICD-10-CM

## 2021-11-23 DIAGNOSIS — E1169 Type 2 diabetes mellitus with other specified complication: Secondary | ICD-10-CM

## 2021-11-23 DIAGNOSIS — I10 Essential (primary) hypertension: Secondary | ICD-10-CM

## 2021-11-23 DIAGNOSIS — M1611 Unilateral primary osteoarthritis, right hip: Secondary | ICD-10-CM

## 2021-11-23 DIAGNOSIS — M545 Low back pain, unspecified: Secondary | ICD-10-CM | POA: Diagnosis not present

## 2021-11-23 DIAGNOSIS — M47816 Spondylosis without myelopathy or radiculopathy, lumbar region: Secondary | ICD-10-CM

## 2021-11-23 MED ORDER — MELOXICAM 15 MG PO TABS: 7.5000 mg | ORAL_TABLET | Freq: Every day | ORAL | 1 refills | Status: DC | PRN

## 2021-11-23 MED ORDER — METHOCARBAMOL 500 MG PO TABS: 500.0000 mg | ORAL_TABLET | Freq: Two times a day (BID) | ORAL | 1 refills | Status: DC | PRN

## 2021-11-24 LAB — CBC WITH DIFFERENTIAL/PLATELET
Absolute Monocytes: 488 cells/uL (ref 200–950)
Basophils Absolute: 48 cells/uL (ref 0–200)
Basophils Relative: 0.9 %
Eosinophils Absolute: 201 cells/uL (ref 15–500)
Eosinophils Relative: 3.8 %
HCT: 41.6 % (ref 35.0–45.0)
Hemoglobin: 14.1 g/dL (ref 11.7–15.5)
Lymphs Abs: 2730 cells/uL (ref 850–3900)
MCH: 29.7 pg (ref 27.0–33.0)
MCHC: 33.9 g/dL (ref 32.0–36.0)
MCV: 87.8 fL (ref 80.0–100.0)
MPV: 9.1 fL (ref 7.5–12.5)
Monocytes Relative: 9.2 %
Neutro Abs: 1834 cells/uL (ref 1500–7800)
Neutrophils Relative %: 34.6 %
Platelets: 225 10*3/uL (ref 140–400)
RBC: 4.74 10*6/uL (ref 3.80–5.10)
RDW: 12.7 % (ref 11.0–15.0)
Total Lymphocyte: 51.5 %
WBC: 5.3 10*3/uL (ref 3.8–10.8)

## 2021-11-24 LAB — URINALYSIS, ROUTINE W REFLEX MICROSCOPIC
Bacteria, UA: NONE SEEN /HPF
Bilirubin Urine: NEGATIVE
Glucose, UA: NEGATIVE
Hgb urine dipstick: NEGATIVE
Hyaline Cast: NONE SEEN /LPF
Ketones, ur: NEGATIVE
Nitrite: NEGATIVE
Protein, ur: NEGATIVE
RBC / HPF: NONE SEEN /HPF (ref 0–2)
Specific Gravity, Urine: 1.009 (ref 1.001–1.035)
pH: 5.5 (ref 5.0–8.0)

## 2021-11-24 LAB — HEMOGLOBIN A1C
Hgb A1c MFr Bld: 5.7 % of total Hgb — ABNORMAL HIGH (ref <5.7)
Mean Plasma Glucose: 117 mg/dL
eAG (mmol/L): 6.5 mmol/L

## 2021-11-24 LAB — COMPLETE METABOLIC PANEL WITH GFR
AG Ratio: 1.2 (calc) (ref 1.0–2.5)
ALT: 16 U/L (ref 6–29)
AST: 18 U/L (ref 10–35)
Albumin: 4 g/dL (ref 3.6–5.1)
Alkaline phosphatase (APISO): 81 U/L (ref 37–153)
BUN: 12 mg/dL (ref 7–25)
CO2: 29 mmol/L (ref 20–32)
Calcium: 9 mg/dL (ref 8.6–10.4)
Chloride: 107 mmol/L (ref 98–110)
Creat: 0.83 mg/dL (ref 0.50–1.05)
Globulin: 3.3 g/dL (calc) (ref 1.9–3.7)
Glucose, Bld: 88 mg/dL (ref 65–99)
Potassium: 4.4 mmol/L (ref 3.5–5.3)
Sodium: 140 mmol/L (ref 135–146)
Total Bilirubin: 0.3 mg/dL (ref 0.2–1.2)
Total Protein: 7.3 g/dL (ref 6.1–8.1)
eGFR: 81 mL/min/{1.73_m2} (ref >=60)

## 2021-11-24 LAB — MICROALBUMIN / CREATININE URINE RATIO
Creatinine, Urine: 60 mg/dL (ref 20–275)
Microalb, Ur: <0.2 mg/dL

## 2021-11-24 LAB — LIPID PANEL
Cholesterol: 95 mg/dL (ref <200)
HDL: 42 mg/dL — ABNORMAL LOW (ref >=50)
LDL Cholesterol (Calc): 36 mg/dL (calc)
Non-HDL Cholesterol (Calc): 53 mg/dL (calc) (ref <130)
Total CHOL/HDL Ratio: 2.3 (calc) (ref <5.0)
Triglycerides: 85 mg/dL (ref <150)

## 2021-11-24 LAB — TSH: TSH: 2.77 mIU/L (ref 0.40–4.50)

## 2021-11-25 ENCOUNTER — Ambulatory Visit
Admission: RE | Admit: 2021-11-25 | Discharge: 2021-11-25 | Disposition: A | Discharge status: Home / Self Care | Payer: BC Managed Care – PPO | Source: Ambulatory Visit | Attending: Family Medicine | Admitting: Family Medicine

## 2021-11-25 ENCOUNTER — Ambulatory Visit
Admission: RE | Admit: 2021-11-25 | Discharge: 2021-11-25 | Disposition: A | Discharge status: Home / Self Care | Payer: BC Managed Care – PPO | Attending: Family Medicine | Admitting: Family Medicine

## 2021-11-25 DIAGNOSIS — M545 Low back pain, unspecified: Secondary | ICD-10-CM

## 2021-11-30 NOTE — Addendum Note (Signed)
Addended by: Delsa Grana on: 11/30/2021 04:39 PM   Modules accepted: Orders

## 2021-12-09 ENCOUNTER — Encounter: Payer: Self-pay | Admitting: Cardiology

## 2021-12-09 ENCOUNTER — Ambulatory Visit: Payer: BC Managed Care – PPO | Attending: Cardiology | Admitting: Cardiology

## 2021-12-09 VITALS — BP 126/80 | HR 58 | Ht 64.0 in | Wt 247.4 lb

## 2021-12-09 DIAGNOSIS — I10 Essential (primary) hypertension: Secondary | ICD-10-CM | POA: Diagnosis not present

## 2021-12-09 DIAGNOSIS — E7800 Pure hypercholesterolemia, unspecified: Secondary | ICD-10-CM | POA: Diagnosis not present

## 2021-12-09 DIAGNOSIS — I4719 Other supraventricular tachycardia: Secondary | ICD-10-CM

## 2021-12-09 DIAGNOSIS — I2510 Atherosclerotic heart disease of native coronary artery without angina pectoris: Secondary | ICD-10-CM

## 2021-12-09 MED ORDER — MOUNJARO 2.5 MG/0.5ML ~~LOC~~ SOAJ: 2.5000 mg | SUBCUTANEOUS | 0 refills | Status: DC

## 2021-12-09 MED ORDER — MOUNJARO 5 MG/0.5ML ~~LOC~~ SOAJ: 5.0000 mg | SUBCUTANEOUS | 0 refills | Status: DC

## 2021-12-09 NOTE — Patient Instructions (Addendum)
Medication Instructions:   Your physician has recommended you make the following change in your medication:   START taking Tirzepatide (Mounjaro) 2.5 MG once a week for 4 weeks, then take 5 MG once a week for 4 weeks. (Call us when you have 2 weeks left at the 5 MG dose so we can send in the next dose).  2.   STOP taking your Trulicity.  *If you need a refill on your cardiac medications before your next appointment, please call your pharmacy*    Follow-Up: At [facility], you and your health needs are our priority.  As part of our continuing mission to provide you with exceptional heart care, we have created designated Provider Care Teams.  These Care Teams include your primary Cardiologist (physician) and Advanced Practice Providers (APPs -  Physician Assistants and Nurse Practitioners) who all work together to provide you with the care you need, when you need it.  We recommend signing up for the patient portal called "MyChart".  Sign up information is provided on this After Visit Summary.  MyChart is used to connect with patients for Virtual Visits (Telemedicine).  Patients are able to view lab/test results, encounter notes, upcoming appointments, etc.  Non-urgent messages can be sent to your provider as well.   To learn more about what you can do with MyChart, go to [email or web address].    Your next appointment:   3 month(s)  The format for your next appointment:   In Person  Provider:   You may see Kate Sable, MD ONLY  Other Instructions   Important Information About Sugar

## 2021-12-09 NOTE — Progress Notes (Signed)
Cardiology Office Note:    Date:  12/09/2021   ID:  Elizabeth Russo, DOB February 26, 1960, MRN [id number]  PCP:  Delsa Grana, PA-C  Cardiologist:  Kate Sable, MD  Electrophysiologist:  None   Referring MD: Delsa Grana, PA-C    Chief Complaint  Patient presents with   Follow-up    12 month follow up, having "fluttering this morning"    History of Present Illness:    Elizabeth Russo is a 61 y.o. female with a hx of atrial tachycardia, hypertension, CAD,(NSTEMI with PCI/DES to OM2, mid RCA occ, January 2013), diabetes, who presents for follow-up.    Being seen for atrial tachycardia, has been feeling well over the past year.  Heart a little bit of flutters lasting a few seconds this morning.  Otherwise doing okay, denies chest pain or shortness of breath.  Trying to lose weight, has not been very successful.  On Trulicity.   Prior notes Previously follow-up at [facility].  diagnosed with NSTEMI 2013. Left heart cath was performed which showed an occluded mid RCA, 90% stenosis in a small OM1 branch.  50% proximal RCA, distal LAD large OM 2.  PCI was performed to the OM 2 branch with a drug-eluting stent.   In 2018, she had a stress echocardiogram, exercising for 6 minutes achieving 10.1 METS with no wall motion abnormalities.  Last echocardiogram in 2013 showed EF of 45%, with mild concentric LVH.   She has a history of myalgia on statin.  She is currently taking Zetia and Praluent for hyperlipidemia..   Echo 12/2018 showed preserved ejection fraction, EF 60 to 65%, impaired relaxation.  Did not tolerate verapamil in the past, Coreg was started. Lexiscan Myoview 2021 showed no ischemia. .  Past Medical History:  Diagnosis Date   (HFpEF) heart failure with preserved ejection fraction (HCC)    Atrial fibrillation ([place]) 02/13/2011   Breast mass, right 04/25/2014   CAD (coronary artery disease)    Diabetes mellitus without complication ([place])    Diverticulosis    Family history  of colon cancer in father    Goiter 03/19/2014   Hearing loss 08/25/2011   HLD (hyperlipidemia)    Hypertension    Hypothyroid    Localized enlarged lymph nodes 10/23/2014   cervical    Low back pain 12/25/2014   Morbid obesity ([place]) 06/26/2014   Myalgia due to statin 04/13/2017   Intolerant to three different statins   NSTEMI (non-ST elevated myocardial infarction) ([place]) 01/2011   Subendocardial   Personal history of colonic polyps    Vertigo     Past Surgical History:  Procedure Laterality Date   ABDOMINAL HYSTERECTOMY     CARDIAC CATHETERIZATION     CHOLECYSTECTOMY     COLONOSCOPY WITH PROPOFOL N/A 08/13/2017   Procedure: COLONOSCOPY WITH PROPOFOL;  Surgeon: Lin Landsman, MD;  Location: [facility] ENDOSCOPY;  Service: Gastroenterology;  Laterality: N/A;   heart stent  2012    Current Medications: Current Meds  Medication Sig   amLODipine-valsartan (EXFORGE) 10-160 MG tablet Take 1 tablet by mouth daily.   aspirin EC 81 MG tablet Take 81 mg by mouth daily.    carvedilol (COREG) 25 MG tablet Take 0.5 tablet (12.5 mg total) by mouth Two (2) times a day with meals.   Evolocumab (REPATHA) 140 MG/ML SOSY Inject 140 mg into the skin every 14 (fourteen) days.   famotidine (PEPCID) 20 MG tablet Take 1 tablet (20 mg total) by mouth Two (2) times a day.  fluticasone (FLONASE) 50 MCG/ACT nasal spray Place 2 sprays into both nostrils daily.   glucose blood test strip Test tid   Insulin Pen Needle 32G X 4 MM MISC Use as directed   levothyroxine (SYNTHROID) 150 MCG tablet Take 1 tablet by mouth once daily on Monday, Wednesday and Friday and take 137 mcg on the other days.   meloxicam (MOBIC) 15 MG tablet Take 0.5-1 tablets (7.5-15 mg total) by mouth daily as needed for pain.   metFORMIN (GLUCOPHAGE-XR) 500 MG 24 hr tablet Take 1 tablet (500 mg total) by mouth daily with breakfast.   methocarbamol (ROBAXIN) 500 MG tablet Take 1 tablet (500 mg total) by mouth 2 (two) times daily as needed for  muscle spasms (muscle tightness/pan). Can be sedating, do not drive while taking medications   tirzepatide (MOUNJARO) 2.5 MG/0.5ML Pen Inject 2.5 mg into the skin once a week. Refill dose will be at 5 MG once a week.   tirzepatide [facility]) 5 MG/0.5ML Pen Inject 5 mg into the skin once a week. Call our office for next refill of next dose.   [DISCONTINUED] TRULICITY 1.5 JM/4.2AS SOPN Inject 1.5 mg into the skin once a week.     Allergies:   Atorvastatin, Hydralazine, Hydrochlorothiazide, Lisinopril, Pravastatin, and Rosuvastatin   Social History   Socioeconomic History   Marital status: Widowed    Spouse name: Not on file   Number of children: 2   Years of education: Not on file   Highest education level: High school graduate  Occupational History   Not on file  Tobacco Use   Smoking status: Former    Packs/day: 0.50    Years: 30.00    Total pack years: 15.00    Types: Cigarettes    Start date: 01/09/1978    Quit date: 03/09/2013    Years since quitting: 8.7   Smokeless tobacco: Never  Vaping Use   Vaping Use: Never used  Substance and Sexual Activity   Alcohol use: Yes    Alcohol/week: 0.0 standard drinks of alcohol    Comment: Socially    Drug use: No   Sexual activity: Yes    Birth control/protection: Post-menopausal  Other Topics Concern   Not on file  Social History Narrative   Not on file   Social Determinants of Health   Financial Resource Strain: Low Risk  (11/01/2020)   Overall Financial Resource Strain (CARDIA)    Difficulty of Paying Living Expenses: Not hard at all  Food Insecurity: No Food Insecurity (11/01/2020)   Hunger Vital Sign    Worried About Running Out of Food in the Last Year: Never true    Ran Out of Food in the Last Year: Never true  Transportation Needs: No Transportation Needs (11/01/2020)   PRAPARE - Hydrologist (Medical): No    Lack of Transportation (Non-Medical): No  Physical Activity: Insufficiently Active  (11/01/2020)   Exercise Vital Sign    Days of Exercise per Week: 2 days    Minutes of Exercise per Session: 20 min  Stress: No Stress Concern Present (11/01/2020)   [place]    Feeling of Stress : Only a little  Social Connections: Moderately Isolated (11/01/2020)   Social Connection and Isolation Panel [NHANES]    Frequency of Communication with Friends and Family: More than three times a week    Frequency of Social Gatherings with Friends and Family: Once a week  Attends Religious Services: 1 to 4 times per year    Active Member of Clubs or Organizations: No    Attends Archivist Meetings: Never    Marital Status: Widowed     Family History: The patient's family history includes Arthritis in her sister; Cancer in her father and maternal grandfather; Diabetes in her father, mother, sister, and sister; Heart attack in her paternal grandfather; Heart disease in her mother, sister, sister, and sister; Hyperlipidemia in her mother and sister; Hypertension in her father, mother, and sister; Kidney disease in her sister; Thyroid disease in her mother.  ROS:   Please see the history of present illness.     All other systems reviewed and are negative.  EKGs/Labs/Other Studies Reviewed:    EKG:  EKG is  ordered today.  The ekg ordered today demonstrates sinus bradycardia.  Recent Labs: 11/23/2021: ALT 16; BUN 12; Creat 0.83; Hemoglobin 14.1; Platelets 225; Potassium 4.4; Sodium 140; TSH 2.77  Recent Lipid Panel    Component Value Date/Time   CHOL 95 11/23/2021 0849   CHOL 184 05/04/2015 0858   TRIG 85 11/23/2021 0849   HDL 42 (L) 11/23/2021 0849   HDL 38 (L) 05/04/2015 0858   CHOLHDL 2.3 11/23/2021 0849   VLDL 21 04/04/2016 0932   LDLCALC 36 11/23/2021 0849    Physical Exam:    VS:  BP 126/80 (BP Location: Left Arm, Patient Position: Sitting, Cuff Size: Large)   Pulse (!) 58   Ht '5\' 4"'$  (1.626 m)    Wt 247 lb 6.4 oz (112.2 kg)   LMP  (LMP Unknown)   SpO2 99%   BMI 42.47 kg/m     Wt Readings from Last 3 Encounters:  12/09/21 247 lb 6.4 oz (112.2 kg)  11/23/21 246 lb 8 oz (111.8 kg)  06/03/21 245 lb 6.4 oz (111.3 kg)     GEN:  Well nourished, well developed in no acute distress, obese HEENT: Normal NECK: No JVD; No carotid bruits CARDIAC: RRR, no murmurs, rubs, gallops RESPIRATORY:  Clear to auscultation without rales, wheezing or rhonchi  ABDOMEN: Soft, non-tender, non-distended MUSCULOSKELETAL:  No edema; No deformity  SKIN: Warm and dry NEUROLOGIC:  Alert and oriented x 3 PSYCHIATRIC:  Normal affect   ASSESSMENT:    1. Atrial tachycardia   2. Primary hypertension   3. Coronary artery disease involving native coronary artery of native heart without angina pectoris   4. Pure hypercholesterolemia   5. Morbid obesity ([place])     PLAN:    In order of problems listed above:  Atrial tachycardia, symptoms better well controlled on carvedilol.  Continue carvedilol 12.5 mg twice daily. hypertension, BP controlled.  Continue  Amlodipine-valsartan combo, Coreg. CAD/DES to [facility] 2013.  LDL at goal.  Denies chest pain.  Last EF 60 to 65%. Cont Aspirin, Praluent Hyperlipidemia, bilateral controlled.  Not tolerant to statins.  cont Praluent. Morbid obesity, diabetic.  Stop Trulicity, start Mounjaro.  Follow-up in 3 months.  Total encounter time more than 35 minutes  Greater than 50% was spent in counseling and coordination of care with the patient   This note was generated in part or whole with voice recognition software. Voice recognition is usually quite accurate but there are transcription errors that can and very often do occur. I apologize for any typographical errors that were not detected and corrected.   Medication Adjustments/Labs and Tests Ordered: Current medicines are reviewed at length with the patient today.  Concerns regarding medicines are outlined  above.   Orders Placed This Encounter  Procedures   EKG 12-Lead    Meds ordered this encounter  Medications   tirzepatide (MOUNJARO) 2.5 MG/0.5ML Pen    Sig: Inject 2.5 mg into the skin once a week. Refill dose will be at 5 MG once a week.    Dispense:  2 mL    Refill:  0   tirzepatide (MOUNJARO) 5 MG/0.5ML Pen    Sig: Inject 5 mg into the skin once a week. Call our office for next refill of next dose.    Dispense:  2 mL    Refill:  0     Patient Instructions  Medication Instructions:   Your physician has recommended you make the following change in your medication:   START taking Tirzepatide (Mounjaro) 2.5 MG once a week for 4 weeks, then take 5 MG once a week for 4 weeks. (Call us when you have 2 weeks left at the 5 MG dose so we can send in the next dose).  2.   STOP taking your Trulicity.  *If you need a refill on your cardiac medications before your next appointment, please call your pharmacy*    Follow-Up: At [facility], you and your health needs are our priority.  As part of our continuing mission to provide you with exceptional heart care, we have created designated Provider Care Teams.  These Care Teams include your primary Cardiologist (physician) and Advanced Practice Providers (APPs -  Physician Assistants and Nurse Practitioners) who all work together to provide you with the care you need, when you need it.  We recommend signing up for the patient portal called "MyChart".  Sign up information is provided on this After Visit Summary.  MyChart is used to connect with patients for Virtual Visits (Telemedicine).  Patients are able to view lab/test results, encounter notes, upcoming appointments, etc.  Non-urgent messages can be sent to your provider as well.   To learn more about what you can do with MyChart, go to [email or web address].    Your next appointment:   3 month(s)  The format for your next appointment:   In Person  Provider:   You may see Kate Sable, MD ONLY  Other Instructions   Important Information About Sugar         Signed, Kate Sable, MD  12/09/2021 10:23 AM    [place]

## 2021-12-12 ENCOUNTER — Other Ambulatory Visit: Payer: Self-pay

## 2021-12-12 MED ORDER — REPATHA 140 MG/ML ~~LOC~~ SOSY: 140.0000 mg | PREFILLED_SYRINGE | SUBCUTANEOUS | 2 refills | Status: DC

## 2021-12-13 ENCOUNTER — Other Ambulatory Visit: Payer: Self-pay | Admitting: *Deleted

## 2021-12-13 MED ORDER — REPATHA 140 MG/ML ~~LOC~~ SOSY: 140.0000 mg | PREFILLED_SYRINGE | SUBCUTANEOUS | 3 refills | Status: DC

## 2021-12-14 ENCOUNTER — Telehealth: Payer: Self-pay | Admitting: Cardiology

## 2021-12-14 NOTE — Telephone Encounter (Signed)
Pt c/o medication issue:  1. Name of Medication:  Evolocumab (REPATHA) 140 MG/ML SOSY  tirzepatide (MOUNJARO) 5 MG/0.5ML Pen  2. How are you currently taking this medication (dosage and times per day)?   3. Are you having a reaction (difficulty breathing--STAT)?   4. What is your medication issue? Pt states the pharmacy has told her she needs prior auth for these medications and she is completely out of the repatha. Requesting this be called in as soon as possible.

## 2021-12-15 ENCOUNTER — Telehealth: Payer: Self-pay

## 2021-12-15 ENCOUNTER — Other Ambulatory Visit: Payer: Self-pay

## 2021-12-15 NOTE — Telephone Encounter (Signed)
PA started in covermymeds  Repatha SureClick '140MG'$ /ML auto-injectors Elianna Valentino (Key: [id number])  Wait for Questions [employer] NCPDP 2017 typically responds with questions in less than 15 minutes, but may take up to 24 hours.

## 2021-12-15 NOTE — Telephone Encounter (Signed)
Received fax from [facility] [employer], Repatha has been approved 12-15-2021 through 12-15-2022.   Approval letter faxed to [facility] [phone].

## 2021-12-15 NOTE — Telephone Encounter (Signed)
PA started in [employer] (Key: [id number]) - [id number] Need help? Call us at [phone] Status Sent to [employer] The plan will fax you a determination, typically within 1 to 5 business days.

## 2021-12-15 NOTE — Telephone Encounter (Signed)
PA started on both medications through covermymeds  See new encounter for determinations

## 2021-12-15 NOTE — Telephone Encounter (Signed)
PA started through [employer] (Key: [id number])  Repatha SureClick '140MG'$ /ML auto-injectors This request has received an approval. View the bottom of the request for an electronic copy of the approval letter.

## 2021-12-16 ENCOUNTER — Telehealth: Payer: Self-pay

## 2021-12-16 MED ORDER — TRULICITY 1.5 MG/0.5ML ~~LOC~~ SOAJ: 1.5000 mg | SUBCUTANEOUS | 0 refills | Status: DC

## 2021-12-16 NOTE — Addendum Note (Signed)
Addended by: Janan Ridge on: 12/16/2021 02:53 PM   Modules accepted: Orders

## 2021-12-16 NOTE — Telephone Encounter (Signed)
Called patient and made her aware of Dr. Thereasa Solo recommendations for the denial on Mounjaro.   Patient was agreeable.

## 2021-12-16 NOTE — Telephone Encounter (Signed)
PA started through covermymeds  Tabbitha Janvrin (Key: [id number]) - [id number] Darcel Bayley '5MG'$ /0.5ML pen-injectors  PA was denied due to patient A1C not being 6.5 or higher.

## 2021-12-29 ENCOUNTER — Ambulatory Visit (INDEPENDENT_AMBULATORY_CARE_PROVIDER_SITE_OTHER): Payer: BC Managed Care – PPO | Admitting: Family Medicine

## 2021-12-29 ENCOUNTER — Encounter: Payer: Self-pay | Admitting: Family Medicine

## 2021-12-29 DIAGNOSIS — J069 Acute upper respiratory infection, unspecified: Secondary | ICD-10-CM | POA: Diagnosis not present

## 2021-12-29 MED ORDER — LEVOCETIRIZINE DIHYDROCHLORIDE 5 MG PO TABS: 5.0000 mg | ORAL_TABLET | Freq: Every evening | ORAL | 2 refills | Status: DC

## 2021-12-29 MED ORDER — PROMETHAZINE-DM 6.25-15 MG/5ML PO SYRP: 2.5000 mL | ORAL_SOLUTION | Freq: Four times a day (QID) | ORAL | 0 refills | Status: DC | PRN

## 2021-12-29 MED ORDER — BENZONATATE 100 MG PO CAPS: 100.0000 mg | ORAL_CAPSULE | Freq: Three times a day (TID) | ORAL | 1 refills | Status: DC | PRN

## 2021-12-29 NOTE — Progress Notes (Signed)
Name: Elizabeth Russo   MRN: [id number]    DOB: 1960-02-14   Date:12/29/2021       Progress Note  Subjective:    Chief Complaint  Chief Complaint  Patient presents with   Cough    Coughing up phlegm, has tried mucinex syrup has not helped   Nasal Congestion    I connected with  DAGNY FIORENTINO on 12/29/21 at  9:40 AM EST by telephone and verified that I am speaking with the correct person using two identifiers.   I discussed the limitations, risks, security and privacy concerns of performing an evaluation and management service by telephone and the availability of in person appointments. Staff also discussed with the patient that there may be a patient responsible charge related to this service.  Patient verbalized understanding and agreed to proceed with encounter. Patient Location: home Provider Location: [facility] Additional Individuals present: none  Cough Pertinent negatives include no shortness of breath or wheezing.   Presents for URI, sx started 2 d ago Clearing throat, sore throat/scratchy/burning worse when she first wakes up, mostly concerned with chest congestion and productive cough, can hear her chest rattling, gets a little winded when talking but no DOE Tried mucinex dm cough syrup at night but it didn't help it go away No fever, sweats, CP     Patient Active Problem List   Diagnosis Date Noted   S/P cholecystectomy 01/22/2020   Calculus of gallbladder with acute cholecystitis without obstruction    Statin intolerance 03/31/2019   Heart failure with preserved left ventricular function (HFpEF) ([place]) 03/31/2019   Atrial paroxysmal tachycardia 03/31/2019   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 with body mass index (BMI) of 45.0 to 49.9 in adult ([place]) 03/31/2019   Urinary incontinence 03/31/2019   Myalgia due to statin 04/13/2017   Venous stasis 04/13/2017   History of non-ST elevation myocardial infarction (NSTEMI) 02/05/2015   Hx of  percutaneous transluminal coronary angioplasty 02/05/2015   Gout of knee 12/25/2014   Chronic combined systolic and diastolic heart failure ([place]) 05/24/2014   Coronary atherosclerosis 12/19/2013   Hyperlipidemia 12/19/2013   Essential hypertension 12/18/2013   Diabetes mellitus type 2 in obese ([place]) 12/18/2013   Hypothyroidism 12/18/2013   Dyslipidemia 02/13/2011   History of tobacco use 02/13/2011    Social History   Tobacco Use   Smoking status: Former    Packs/day: 0.50    Years: 30.00    Total pack years: 15.00    Types: Cigarettes    Start date: 01/09/1978    Quit date: 03/09/2013    Years since quitting: 8.8   Smokeless tobacco: Never  Substance Use Topics   Alcohol use: Yes    Alcohol/week: 0.0 standard drinks of alcohol    Comment: Socially      Current Outpatient Medications:    amLODipine-valsartan (EXFORGE) 10-160 MG tablet, Take 1 tablet by mouth daily., Disp: 90 tablet, Rfl: 0   aspirin EC 81 MG tablet, Take 81 mg by mouth daily. , Disp: , Rfl:    carvedilol (COREG) 25 MG tablet, Take 0.5 tablet (12.5 mg total) by mouth Two (2) times a day with meals., Disp: 90 tablet, Rfl: 0   Dulaglutide (TRULICITY) 1.5 DG/3.8VF SOPN, Inject 1.5 mg into the skin once a week., Disp: 2 mL, Rfl: 0   Evolocumab (REPATHA) 140 MG/ML SOSY, Inject 140 mg into the skin every 14 (fourteen) days., Disp: 2 mL, Rfl: 3   famotidine (PEPCID) 20 MG tablet,  Take 1 tablet (20 mg total) by mouth Two (2) times a day., Disp: 180 tablet, Rfl: 3   fluticasone (FLONASE) 50 MCG/ACT nasal spray, Place 2 sprays into both nostrils daily., Disp: 16 g, Rfl: 1   glucose blood test strip, Test tid, Disp: 100 each, Rfl: 12   Insulin Pen Needle 32G X 4 MM MISC, Use as directed, Disp: 100 each, Rfl: PRN   levothyroxine (SYNTHROID) 150 MCG tablet, Take 1 tablet by mouth once daily on Monday, Wednesday and Friday and take 137 mcg on the other days., Disp: 78 tablet, Rfl: 1   meloxicam (MOBIC) 15 MG tablet, Take 0.5-1  tablets (7.5-15 mg total) by mouth daily as needed for pain., Disp: 30 tablet, Rfl: 1   metFORMIN (GLUCOPHAGE-XR) 500 MG 24 hr tablet, Take 1 tablet (500 mg total) by mouth daily with breakfast., Disp: 90 tablet, Rfl: 3   methocarbamol (ROBAXIN) 500 MG tablet, Take 1 tablet (500 mg total) by mouth 2 (two) times daily as needed for muscle spasms (muscle tightness/pan). Can be sedating, do not drive while taking medications, Disp: 20 tablet, Rfl: 1   clotrimazole-betamethasone (LOTRISONE) cream, Apply to affected area 2 times daily prn for skin fold rashes (Patient not taking: Reported on 12/09/2021), Disp: 15 g, Rfl: 3   diclofenac sodium (VOLTAREN) 1 % GEL, Apply 2 g topically daily as needed (pain). (Patient not taking: Reported on 12/09/2021), Disp: , Rfl:    furosemide (LASIX) 20 MG tablet, Take when needed for Edema. (Patient not taking: Reported on 12/09/2021), Disp: 90 tablet, Rfl: 3   meclizine (ANTIVERT) 25 MG tablet, Take 1 tablet (25 mg total) by mouth Three (3) times a day as needed for dizziness. (Patient not taking: Reported on 12/09/2021), Disp: 30 tablet, Rfl: 1  Allergies  Allergen Reactions   Atorvastatin Other (See Comments)    Other reaction(s): Muscle Pain   Hydralazine Other (See Comments)    Aggravated gout Aggravated gout   Hydrochlorothiazide Other (See Comments)    Aggravated gout   Lisinopril Cough   Pravastatin Other (See Comments)    Other reaction(s): Muscle Pain   Rosuvastatin Other (See Comments)    Other reaction(s): Muscle Pain    Chart Review: I personally reviewed active problem list, medication list, allergies, family history, social history, health maintenance, notes from last encounter, lab results, imaging with the patient/caregiver today.   Review of Systems  Constitutional: Negative.   HENT: Negative.    Eyes: Negative.   Respiratory:  Positive for cough. Negative for chest tightness, shortness of breath and wheezing.   Cardiovascular: Negative.    Gastrointestinal: Negative.   Endocrine: Negative.   Genitourinary: Negative.   Musculoskeletal: Negative.   Skin: Negative.   Allergic/Immunologic: Negative.   Neurological: Negative.   Hematological: Negative.   Psychiatric/Behavioral: Negative.    All other systems reviewed and are negative.    Objective:    Virtual encounter, vitals limited, only able to obtain the following There were no vitals filed for this visit. There is no height or weight on file to calculate BMI. Nursing Note and Vital Signs reviewed.  Physical Exam Vitals reviewed.  Neck:     Trachea: Phonation normal.  Pulmonary:     Effort: No respiratory distress.  Neurological:     Mental Status: She is alert.  Psychiatric:        Mood and Affect: Mood normal.     PE limited by telephone encounter  No results found for this or any previous  visit (from the past 72 hour(s)).  Assessment and Plan:     ICD-10-CM   1. Upper respiratory tract infection, unspecified type  J06.9 promethazine-dextromethorphan (PROMETHAZINE-DM) 6.25-15 MG/5ML syrup    levocetirizine (XYZAL) 5 MG tablet    benzonatate (TESSALON) 100 MG capsule    Mild sx onset 2 days ago, no CP/SOB/fever, sweats Recommended she test at home for COVID Can otherwise do conservative management and tx sx with mucinex, and meds as noted above for postnasal drip and cough  -Red flags and when to present for emergency care or RTC including but not limited to new/worsening/un-resolving symptoms,  reviewed with patient at time of visit. Follow up and care instructions discussed and provided in AVS. - I discussed the assessment and treatment plan with the patient. The patient was provided an opportunity to ask questions and all were answered. The patient agreed with the plan and demonstrated an understanding of the instructions.  - The patient was advised to call back or seek an in-person evaluation if the symptoms worsen or if the condition fails to  improve as anticipated.  I provided 15 minutes of non-face-to-face time during this encounter.  Delsa Grana, PA-C 12/29/21 9:46 AM

## 2022-01-05 ENCOUNTER — Encounter: Payer: Self-pay | Admitting: Family Medicine

## 2022-01-05 ENCOUNTER — Telehealth (INDEPENDENT_AMBULATORY_CARE_PROVIDER_SITE_OTHER): Payer: BC Managed Care – PPO | Admitting: Family Medicine

## 2022-01-05 VITALS — Ht 64.0 in | Wt 246.0 lb

## 2022-01-05 DIAGNOSIS — J069 Acute upper respiratory infection, unspecified: Secondary | ICD-10-CM

## 2022-01-05 DIAGNOSIS — U071 COVID-19: Secondary | ICD-10-CM

## 2022-01-05 MED ORDER — NIRMATRELVIR/RITONAVIR (PAXLOVID)TABLET: 3.0000 | ORAL_TABLET | Freq: Two times a day (BID) | ORAL | 0 refills | Status: AC

## 2022-01-05 NOTE — Progress Notes (Signed)
Name: Elizabeth Russo   MRN: [id number]    DOB: Oct 11, 1960   Date:01/05/2022       Progress Note  Subjective:    Chief Complaint  Chief Complaint  Patient presents with   Covid Positive    Tested positive w/ home test yesterday   Cough    Sometimes dry, occasional phlegm. Cough syrup/pills didn't help.    I connected with  CATHRINE KRIZAN on 01/05/22 at  2:20 PM EST by telephone and verified that I am speaking with the correct person using two identifiers.   I discussed the limitations, risks, security and privacy concerns of performing an evaluation and management service by telephone and the availability of in person appointments. Staff also discussed with the patient that there may be a patient responsible charge related to this service.  Patient verbalized understanding and agreed to proceed with encounter. Patient Location:  home Provider Location: [facility]  Additional Individuals present:   Cough Associated symptoms include chills, a fever, postnasal drip, rhinorrhea and a sore throat. Pertinent negatives include no chest pain, shortness of breath or wheezing.   Pt had URI last week and started to improve She went to work this Monday, she had tested Sunday for COVID and it was negative Sore throat, HA, stuffy nose and some continued mild cough Fever - 101.7  Sx on 01/03/2022 got much worse, different than last week, body aches, chills, sore throat Tested positive yesterday 01/04/2022 Needs work note    Patient Active Problem List   Diagnosis Date Noted   S/P cholecystectomy 01/22/2020   Calculus of gallbladder with acute cholecystitis without obstruction    Statin intolerance 03/31/2019   Heart failure with preserved left ventricular function (HFpEF) ([place]) 03/31/2019   Atrial paroxysmal tachycardia 03/31/2019   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 with body mass index (BMI) of 45.0 to 49.9 in adult ([place]) 03/31/2019   Urinary incontinence 03/31/2019    Myalgia due to statin 04/13/2017   Venous stasis 04/13/2017   History of non-ST elevation myocardial infarction (NSTEMI) 02/05/2015   Hx of percutaneous transluminal coronary angioplasty 02/05/2015   Gout of knee 12/25/2014   Chronic combined systolic and diastolic heart failure ([place]) 05/24/2014   Coronary atherosclerosis 12/19/2013   Hyperlipidemia 12/19/2013   Essential hypertension 12/18/2013   Diabetes mellitus type 2 in obese ([place]) 12/18/2013   Hypothyroidism 12/18/2013   Dyslipidemia 02/13/2011   History of tobacco use 02/13/2011    Social History   Tobacco Use   Smoking status: Former    Packs/day: 0.50    Years: 30.00    Total pack years: 15.00    Types: Cigarettes    Start date: 01/09/1978    Quit date: 03/09/2013    Years since quitting: 8.8   Smokeless tobacco: Never  Substance Use Topics   Alcohol use: Yes    Alcohol/week: 0.0 standard drinks of alcohol    Comment: Socially      Current Outpatient Medications:    amLODipine-valsartan (EXFORGE) 10-160 MG tablet, Take 1 tablet by mouth daily., Disp: 90 tablet, Rfl: 0   aspirin EC 81 MG tablet, Take 81 mg by mouth daily. , Disp: , Rfl:    benzonatate (TESSALON) 100 MG capsule, Take 1-2 capsules (100-200 mg total) by mouth 3 (three) times daily as needed for cough., Disp: 60 capsule, Rfl: 1   carvedilol (COREG) 25 MG tablet, Take 0.5 tablet (12.5 mg total) by mouth Two (2) times a day with meals., Disp: 90  tablet, Rfl: 0   Dulaglutide (TRULICITY) 1.5 GD/9.2EQ SOPN, Inject 1.5 mg into the skin once a week., Disp: 2 mL, Rfl: 0   Evolocumab (REPATHA) 140 MG/ML SOSY, Inject 140 mg into the skin every 14 (fourteen) days., Disp: 2 mL, Rfl: 3   famotidine (PEPCID) 20 MG tablet, Take 1 tablet (20 mg total) by mouth Two (2) times a day., Disp: 180 tablet, Rfl: 3   fluticasone (FLONASE) 50 MCG/ACT nasal spray, Place 2 sprays into both nostrils daily., Disp: 16 g, Rfl: 1   glucose blood test strip, Test tid, Disp: 100 each,  Rfl: 12   Insulin Pen Needle 32G X 4 MM MISC, Use as directed, Disp: 100 each, Rfl: PRN   levocetirizine (XYZAL) 5 MG tablet, Take 1 tablet (5 mg total) by mouth every evening., Disp: 30 tablet, Rfl: 2   levothyroxine (SYNTHROID) 150 MCG tablet, Take 1 tablet by mouth once daily on Monday, Wednesday and Friday and take 137 mcg on the other days., Disp: 78 tablet, Rfl: 1   meloxicam (MOBIC) 15 MG tablet, Take 0.5-1 tablets (7.5-15 mg total) by mouth daily as needed for pain., Disp: 30 tablet, Rfl: 1   metFORMIN (GLUCOPHAGE-XR) 500 MG 24 hr tablet, Take 1 tablet (500 mg total) by mouth daily with breakfast., Disp: 90 tablet, Rfl: 3   methocarbamol (ROBAXIN) 500 MG tablet, Take 1 tablet (500 mg total) by mouth 2 (two) times daily as needed for muscle spasms (muscle tightness/pan). Can be sedating, do not drive while taking medications, Disp: 20 tablet, Rfl: 1   promethazine-dextromethorphan (PROMETHAZINE-DM) 6.25-15 MG/5ML syrup, Take 2.5-5 mLs by mouth 4 (four) times daily as needed for cough., Disp: 118 mL, Rfl: 0   clotrimazole-betamethasone (LOTRISONE) cream, Apply to affected area 2 times daily prn for skin fold rashes (Patient not taking: Reported on 12/09/2021), Disp: 15 g, Rfl: 3   diclofenac sodium (VOLTAREN) 1 % GEL, Apply 2 g topically daily as needed (pain). (Patient not taking: Reported on 12/09/2021), Disp: , Rfl:    furosemide (LASIX) 20 MG tablet, Take when needed for Edema. (Patient not taking: Reported on 12/09/2021), Disp: 90 tablet, Rfl: 3   meclizine (ANTIVERT) 25 MG tablet, Take 1 tablet (25 mg total) by mouth Three (3) times a day as needed for dizziness. (Patient not taking: Reported on 12/09/2021), Disp: 30 tablet, Rfl: 1  Allergies  Allergen Reactions   Atorvastatin Other (See Comments)    Other reaction(s): Muscle Pain   Hydralazine Other (See Comments)    Aggravated gout Aggravated gout   Hydrochlorothiazide Other (See Comments)    Aggravated gout   Lisinopril Cough    Pravastatin Other (See Comments)    Other reaction(s): Muscle Pain   Rosuvastatin Other (See Comments)    Other reaction(s): Muscle Pain    Chart Review: I personally reviewed active problem list, medication list, allergies, family history, social history, health maintenance, notes from last encounter, lab results, imaging with the patient/caregiver today.    Review of Systems  Constitutional:  Positive for chills, diaphoresis, fatigue and fever. Negative for activity change, appetite change and unexpected weight change.  HENT:  Positive for congestion, postnasal drip, rhinorrhea, sore throat and voice change.   Eyes: Negative.   Respiratory:  Positive for cough. Negative for chest tightness, shortness of breath and wheezing.   Cardiovascular: Negative.  Negative for chest pain.  Gastrointestinal: Negative.   Endocrine: Negative.   Genitourinary: Negative.   Musculoskeletal: Negative.   Skin: Negative.   Allergic/Immunologic: Negative.  Neurological: Negative.   Hematological: Negative.   Psychiatric/Behavioral: Negative.    All other systems reviewed and are negative.    Objective:    Virtual encounter, vitals limited, only able to obtain the following Today's Vitals   01/05/22 1220  Weight: 246 lb (111.6 kg)  Height: '5\' 4"'$  (1.626 m)   Body mass index is 42.23 kg/m. Nursing Note and Vital Signs reviewed.  Physical Exam Vitals and nursing note reviewed.  Neck:     Trachea: Phonation normal.  Pulmonary:     Comments: No audible wheeze or stridor, able to speak in full and complete sentences Psychiatric:        Mood and Affect: Mood normal.     PE limited by telephone encounter  No results found for this or any previous visit (from the past 72 hour(s)).  Assessment and Plan:     ICD-10-CM   1. Upper respiratory tract infection due to COVID-19 virus  U07.1 nirmatrelvir/ritonavir (PAXLOVID) 20 x 150 MG & 10 x '100MG'$  TABS   J06.9     Encouraged pt to  continue prior cough and allergy medications, mucinex and can use tylenol or ibuprofen for fever/sore throat/body aches etc No SOB or CP Work note will be written with pt permission to excuse for covid isolation RTW on 01/09/2022 as long as not febrile or having worsening respiratory sx - will need to let us know if she needs new note - return to work with masking x 5 d 1/1-01/13/2022   -Red flags and when to present for emergency care or RTC including but not limited to new/worsening/un-resolving symptoms,  reviewed with patient at time of visit. Follow up and care instructions discussed and provided in AVS. - I discussed the assessment and treatment plan with the patient. The patient was provided an opportunity to ask questions and all were answered. The patient agreed with the plan and demonstrated an understanding of the instructions.  - The patient was advised to call back or seek an in-person evaluation if the symptoms worsen or if the condition fails to improve as anticipated.  I provided 20+ minutes of non-face-to-face time during this encounter.  Delsa Grana, PA-C 01/05/22 2:25 PM

## 2022-01-10 ENCOUNTER — Telehealth (INDEPENDENT_AMBULATORY_CARE_PROVIDER_SITE_OTHER): Payer: BC Managed Care – PPO | Admitting: Family Medicine

## 2022-01-10 VITALS — Temp 100.7°F

## 2022-01-10 DIAGNOSIS — U071 COVID-19: Secondary | ICD-10-CM | POA: Diagnosis not present

## 2022-01-10 DIAGNOSIS — R051 Acute cough: Secondary | ICD-10-CM

## 2022-01-10 DIAGNOSIS — J019 Acute sinusitis, unspecified: Secondary | ICD-10-CM

## 2022-01-10 DIAGNOSIS — R0602 Shortness of breath: Secondary | ICD-10-CM | POA: Diagnosis not present

## 2022-01-10 DIAGNOSIS — J069 Acute upper respiratory infection, unspecified: Secondary | ICD-10-CM

## 2022-01-10 DIAGNOSIS — J209 Acute bronchitis, unspecified: Secondary | ICD-10-CM

## 2022-01-10 MED ORDER — PREDNISONE 20 MG PO TABS: 40.0000 mg | ORAL_TABLET | Freq: Every day | ORAL | 0 refills | Status: DC

## 2022-01-10 MED ORDER — AMOXICILLIN-POT CLAVULANATE 875-125 MG PO TABS: 1.0000 | ORAL_TABLET | Freq: Two times a day (BID) | ORAL | 0 refills | Status: DC

## 2022-01-10 MED ORDER — ALBUTEROL SULFATE HFA 108 (90 BASE) MCG/ACT IN AERS: 2.0000 | INHALATION_SPRAY | Freq: Four times a day (QID) | RESPIRATORY_TRACT | 0 refills | Status: DC | PRN

## 2022-01-10 NOTE — Progress Notes (Signed)
Name: Elizabeth Russo   MRN: [id number]    DOB: 12-13-1960   Date:01/10/2022       Progress Note  Subjective:    Chief Complaint  Chief Complaint  Patient presents with   Sinusitis   Cough    I connected with  [name] on 01/10/22 at  2:40 PM EST by telephone and verified that I am speaking with the correct person using two identifiers.   I discussed the limitations, risks, security and privacy concerns of performing an evaluation and management service by telephone and the availability of in person appointments. Staff also discussed with the patient that there may be a patient responsible charge related to this service.  Patient verbalized understanding and agreed to proceed with encounter. Patient Location: home Provider Location: [facility] Additional Individuals present: none  Sinusitis The current episode started 1 to 4 weeks ago. The problem has been gradually worsening since onset. The maximum temperature recorded prior to her arrival was 100.4 - 100.9 F. The fever has been present for 5 days or more. Associated symptoms include congestion, coughing, headaches, a hoarse voice, shortness of breath, sinus pressure and sneezing. Pertinent negatives include no sore throat. Past treatments include oral decongestants, nasal decongestants, saline nose sprays and acetaminophen. The treatment provided no relief.  Cough The current episode started 1 to 4 weeks ago. The problem has been gradually worsening. The cough is Non-productive. Associated symptoms include a fever, headaches, nasal congestion, postnasal drip, rhinorrhea, shortness of breath and wheezing. Pertinent negatives include no chest pain, hemoptysis, sore throat, sweats or weight loss.   Two appt recently for viral illness, pt was then positive for COVID 12/28 She took paxlovid and isolated and then went back to work today, she was too SOB at work, coughing fits with talking Fever 100.7 yesterday No pain when she breaths in,  some wheezing, tight, coughing fits, profuse nasal discharge and continued nasal/sinus congestion and pressure       Patient Active Problem List   Diagnosis Date Noted   S/P cholecystectomy 01/22/2020   Calculus of gallbladder with acute cholecystitis without obstruction    Statin intolerance 03/31/2019   Heart failure with preserved left ventricular function (HFpEF) ([place]) 03/31/2019   Atrial paroxysmal tachycardia 03/31/2019   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 with body mass index (BMI) of 45.0 to 49.9 in adult ([place]) 03/31/2019   Urinary incontinence 03/31/2019   Myalgia due to statin 04/13/2017   Venous stasis 04/13/2017   History of non-ST elevation myocardial infarction (NSTEMI) 02/05/2015   Hx of percutaneous transluminal coronary angioplasty 02/05/2015   Gout of knee 12/25/2014   Chronic combined systolic and diastolic heart failure ([place]) 05/24/2014   Coronary atherosclerosis 12/19/2013   Hyperlipidemia 12/19/2013   Essential hypertension 12/18/2013   Diabetes mellitus type 2 in obese ([place]) 12/18/2013   Hypothyroidism 12/18/2013   Dyslipidemia 02/13/2011   History of tobacco use 02/13/2011    Social History   Tobacco Use   Smoking status: Former    Packs/day: 0.50    Years: 30.00    Total pack years: 15.00    Types: Cigarettes    Start date: 01/09/1978    Quit date: 03/09/2013    Years since quitting: 8.8   Smokeless tobacco: Never  Substance Use Topics   Alcohol use: Yes    Alcohol/week: 0.0 standard drinks of alcohol    Comment: Socially      Current Outpatient Medications:    albuterol (VENTOLIN HFA) 108 (  90 Base) MCG/ACT inhaler, Inhale 2 puffs into the lungs every 6 (six) hours as needed for wheezing or shortness of breath., Disp: 8 g, Rfl: 0   amoxicillin-clavulanate (AUGMENTIN) 875-125 MG tablet, Take 1 tablet by mouth 2 (two) times daily., Disp: 20 tablet, Rfl: 0   predniSONE (DELTASONE) 20 MG tablet, Take 2 tablets (40 mg total) by  mouth daily., Disp: 10 tablet, Rfl: 0   amLODipine-valsartan (EXFORGE) 10-160 MG tablet, Take 1 tablet by mouth daily., Disp: 90 tablet, Rfl: 0   aspirin EC 81 MG tablet, Take 81 mg by mouth daily. , Disp: , Rfl:    benzonatate (TESSALON) 100 MG capsule, Take 1-2 capsules (100-200 mg total) by mouth 3 (three) times daily as needed for cough., Disp: 60 capsule, Rfl: 1   carvedilol (COREG) 25 MG tablet, Take 0.5 tablet (12.5 mg total) by mouth Two (2) times a day with meals., Disp: 90 tablet, Rfl: 0   clotrimazole-betamethasone (LOTRISONE) cream, Apply to affected area 2 times daily prn for skin fold rashes (Patient not taking: Reported on 12/09/2021), Disp: 15 g, Rfl: 3   diclofenac sodium (VOLTAREN) 1 % GEL, Apply 2 g topically daily as needed (pain). (Patient not taking: Reported on 12/09/2021), Disp: , Rfl:    Dulaglutide (TRULICITY) 1.5 WT/8.8EK SOPN, Inject 1.5 mg into the skin once a week., Disp: 2 mL, Rfl: 0   Evolocumab (REPATHA) 140 MG/ML SOSY, Inject 140 mg into the skin every 14 (fourteen) days., Disp: 2 mL, Rfl: 3   famotidine (PEPCID) 20 MG tablet, Take 1 tablet (20 mg total) by mouth Two (2) times a day., Disp: 180 tablet, Rfl: 3   fluticasone (FLONASE) 50 MCG/ACT nasal spray, Place 2 sprays into both nostrils daily., Disp: 16 g, Rfl: 1   furosemide (LASIX) 20 MG tablet, Take when needed for Edema. (Patient not taking: Reported on 12/09/2021), Disp: 90 tablet, Rfl: 3   glucose blood test strip, Test tid, Disp: 100 each, Rfl: 12   Insulin Pen Needle 32G X 4 MM MISC, Use as directed, Disp: 100 each, Rfl: PRN   levocetirizine (XYZAL) 5 MG tablet, Take 1 tablet (5 mg total) by mouth every evening., Disp: 30 tablet, Rfl: 2   levothyroxine (SYNTHROID) 150 MCG tablet, Take 1 tablet by mouth once daily on Monday, Wednesday and Friday and take 137 mcg on the other days., Disp: 78 tablet, Rfl: 1   meclizine (ANTIVERT) 25 MG tablet, Take 1 tablet (25 mg total) by mouth Three (3) times a day as needed  for dizziness. (Patient not taking: Reported on 12/09/2021), Disp: 30 tablet, Rfl: 1   meloxicam (MOBIC) 15 MG tablet, Take 0.5-1 tablets (7.5-15 mg total) by mouth daily as needed for pain., Disp: 30 tablet, Rfl: 1   metFORMIN (GLUCOPHAGE-XR) 500 MG 24 hr tablet, Take 1 tablet (500 mg total) by mouth daily with breakfast., Disp: 90 tablet, Rfl: 3   methocarbamol (ROBAXIN) 500 MG tablet, Take 1 tablet (500 mg total) by mouth 2 (two) times daily as needed for muscle spasms (muscle tightness/pan). Can be sedating, do not drive while taking medications, Disp: 20 tablet, Rfl: 1   nirmatrelvir/ritonavir (PAXLOVID) 20 x 150 MG & 10 x '100MG'$  TABS, Take 3 tablets by mouth 2 (two) times daily for 5 days. Take nirmatrelvir (150 mg) two tablets twice daily for 5 days and ritonavir (100 mg) one tablet twice daily for 5 days.  Patient GFR is 83., Disp: 30 tablet, Rfl: 0   promethazine-dextromethorphan (PROMETHAZINE-DM) 6.25-15 MG/5ML  syrup, Take 2.5-5 mLs by mouth 4 (four) times daily as needed for cough., Disp: 118 mL, Rfl: 0  Allergies  Allergen Reactions   Atorvastatin Other (See Comments)    Other reaction(s): Muscle Pain   Hydralazine Other (See Comments)    Aggravated gout Aggravated gout   Hydrochlorothiazide Other (See Comments)    Aggravated gout   Lisinopril Cough   Pravastatin Other (See Comments)    Other reaction(s): Muscle Pain   Rosuvastatin Other (See Comments)    Other reaction(s): Muscle Pain    Chart Review: I personally reviewed active problem list, medication list, allergies, family history, social history, health maintenance, notes from last encounter, lab results, imaging with the patient/caregiver today.   Review of Systems  Constitutional:  Positive for fever. Negative for weight loss.  HENT:  Positive for congestion, hoarse voice, postnasal drip, rhinorrhea, sinus pressure and sneezing. Negative for sore throat.   Eyes: Negative.   Respiratory:  Positive for cough, shortness  of breath and wheezing. Negative for hemoptysis.   Cardiovascular: Negative.  Negative for chest pain.  Gastrointestinal: Negative.   Endocrine: Negative.   Genitourinary: Negative.   Skin: Negative.   Allergic/Immunologic: Negative.   Neurological:  Positive for headaches.  Hematological: Negative.   Psychiatric/Behavioral: Negative.    All other systems reviewed and are negative.    Objective:    Virtual encounter, vitals limited, only able to obtain the following Today's Vitals   01/10/22 1431  Temp: (!) 100.7 F (38.2 C)   There is no height or weight on file to calculate BMI. Nursing Note and Vital Signs reviewed.  Physical Exam Vitals and nursing note reviewed.  Neck:     Comments: Congested nasal phonation Pulmonary:     Comments: Intermittent coughing fits with audible wheeze, occurs with conversation, more than half the time she is able to speak in short sentences, no audible stridor Neurological:     Mental Status: She is alert.  Psychiatric:        Mood and Affect: Mood normal.     PE limited by telephone encounter  No results found for this or any previous visit (from the past 72 hour(s)).  Assessment and Plan:     ICD-10-CM   1. Upper respiratory tract infection due to COVID-19 virus  U07.1 DG Chest 2 View   J06.9     2. Shortness of breath  R06.02 DG Chest 2 View    3. Acute cough  R05.1 DG Chest 2 View    4. Acute sinusitis, recurrence not specified, unspecified location  J01.90 amoxicillin-clavulanate (AUGMENTIN) 875-125 MG tablet    5. Acute bronchitis with bronchospasm  J20.9 predniSONE (DELTASONE) 20 MG tablet    albuterol (VENTOLIN HFA) 108 (90 Base) MCG/ACT inhaler     Pt with continued and some worsening respiratory sx, bronchospams, chest tightness and wheeze, worse sinusitis after 2 weeks of sx and 5 d ago COVID positive - on last day of paxlovid Advised to isolate an additional 5 d Tx for bronchitis Abx to cover sinusitis F/up if  not improving Work note given  -Red flags and when to present for emergency care or RTC including but not limited to new/worsening/un-resolving symptoms,  reviewed with patient at time of visit. Follow up and care instructions discussed and provided in AVS. - I discussed the assessment and treatment plan with the patient. The patient was provided an opportunity to ask questions and all were answered. The patient agreed with the plan and demonstrated  an understanding of the instructions.  - The patient was advised to call back or seek an in-person evaluation if the symptoms worsen or if the condition fails to improve as anticipated.  I provided 18  minutes of non-face-to-face time during this encounter.  Delsa Grana, PA-C 01/10/22 2:35 PM

## 2022-01-25 ENCOUNTER — Ambulatory Visit: Payer: BC Managed Care – PPO | Admitting: Physical Therapy

## 2022-01-31 ENCOUNTER — Encounter: Payer: BC Managed Care – PPO | Admitting: Physical Therapy

## 2022-02-08 ENCOUNTER — Encounter: Payer: BC Managed Care – PPO | Admitting: Physical Therapy

## 2022-02-13 ENCOUNTER — Encounter: Payer: BC Managed Care – PPO | Admitting: Physical Therapy

## 2022-02-15 ENCOUNTER — Encounter: Payer: BC Managed Care – PPO | Admitting: Physical Therapy

## 2022-02-20 ENCOUNTER — Other Ambulatory Visit: Payer: Self-pay | Admitting: Family Medicine

## 2022-02-20 ENCOUNTER — Encounter: Payer: BC Managed Care – PPO | Admitting: Physical Therapy

## 2022-02-20 DIAGNOSIS — I480 Paroxysmal atrial fibrillation: Secondary | ICD-10-CM

## 2022-02-20 DIAGNOSIS — I10 Essential (primary) hypertension: Secondary | ICD-10-CM

## 2022-02-21 NOTE — Telephone Encounter (Signed)
Requested Prescriptions  Pending Prescriptions Disp Refills   carvedilol (COREG) 25 MG tablet [Pharmacy Med Name: carvediloL 25 mg tablet (COREG)] 90 tablet 1    Sig: Take 0.5 tablet (12.5 mg total) by mouth Two (2) times a day with meals.     Cardiovascular: Beta Blockers 3 Passed - 02/20/2022 10:42 AM      Passed - Cr in normal range and within 360 days    Creat  Date Value Ref Range Status  11/23/2021 0.83 0.50 - 1.05 mg/dL Final   Creatinine,U  Date Value Ref Range Status  12/18/2013 109.3 mg/dL Final   Creatinine, Urine  Date Value Ref Range Status  11/23/2021 60 20 - 275 mg/dL Final         Passed - AST in normal range and within 360 days    AST  Date Value Ref Range Status  11/23/2021 18 10 - 35 U/L Final         Passed - ALT in normal range and within 360 days    ALT  Date Value Ref Range Status  11/23/2021 16 6 - 29 U/L Final         Passed - Last BP in normal range    BP Readings from Last 1 Encounters:  12/09/21 126/80         Passed - Last Heart Rate in normal range    Pulse Readings from Last 1 Encounters:  12/09/21 (!) 58         Passed - Valid encounter within last 6 months    Recent Outpatient Visits           1 month ago Upper respiratory tract infection due to COVID-19 virus   [facility] Delsa Grana, PA-C   1 month ago Upper respiratory tract infection due to COVID-19 virus   [facility] Delsa Grana, PA-C   1 month ago Upper respiratory tract infection, unspecified type   [facility] Delsa Grana, PA-C   3 months ago Acute bilateral low back pain without sciatica   [facility] Delsa Grana, PA-C   8 months ago Fatigue, unspecified type   [facility] Delsa Grana, [place]       Future Appointments             In 2 weeks Agbor-Etang, Aaron Edelman, MD [place] at [facility]

## 2022-02-23 ENCOUNTER — Encounter: Payer: BC Managed Care – PPO | Admitting: Physical Therapy

## 2022-02-24 ENCOUNTER — Ambulatory Visit (INDEPENDENT_AMBULATORY_CARE_PROVIDER_SITE_OTHER): Payer: BC Managed Care – PPO | Admitting: Family Medicine

## 2022-02-24 ENCOUNTER — Encounter: Payer: Self-pay | Admitting: Family Medicine

## 2022-02-24 VITALS — BP 128/74 | HR 62 | Temp 98.0°F | Resp 16 | Ht 64.0 in | Wt 246.0 lb

## 2022-02-24 DIAGNOSIS — T466X5A Adverse effect of antihyperlipidemic and antiarteriosclerotic drugs, initial encounter: Secondary | ICD-10-CM

## 2022-02-24 DIAGNOSIS — E669 Obesity, unspecified: Secondary | ICD-10-CM

## 2022-02-24 DIAGNOSIS — E1169 Type 2 diabetes mellitus with other specified complication: Secondary | ICD-10-CM | POA: Diagnosis not present

## 2022-02-24 DIAGNOSIS — M791 Myalgia, unspecified site: Secondary | ICD-10-CM

## 2022-02-24 DIAGNOSIS — I10 Essential (primary) hypertension: Secondary | ICD-10-CM | POA: Diagnosis not present

## 2022-02-24 DIAGNOSIS — E039 Hypothyroidism, unspecified: Secondary | ICD-10-CM

## 2022-02-24 DIAGNOSIS — E782 Mixed hyperlipidemia: Secondary | ICD-10-CM | POA: Diagnosis not present

## 2022-02-24 DIAGNOSIS — Z5181 Encounter for therapeutic drug level monitoring: Secondary | ICD-10-CM

## 2022-02-24 MED ORDER — LEVOTHYROXINE SODIUM 150 MCG PO TABS: ORAL_TABLET | ORAL | 1 refills | Status: DC

## 2022-02-24 MED ORDER — AMLODIPINE BESYLATE-VALSARTAN 10-160 MG PO TABS: 1.0000 | ORAL_TABLET | Freq: Every day | ORAL | 0 refills | Status: DC

## 2022-02-24 MED ORDER — LEVOTHYROXINE SODIUM 137 MCG PO TABS: ORAL_TABLET | ORAL | 3 refills | Status: DC

## 2022-02-24 MED ORDER — TRULICITY 1.5 MG/0.5ML ~~LOC~~ SOAJ: 1.5000 mg | SUBCUTANEOUS | 0 refills | Status: DC

## 2022-02-25 LAB — MICROALBUMIN / CREATININE URINE RATIO
Creatinine, Urine: 81 mg/dL (ref 20–275)
Microalb Creat Ratio: 4 mcg/mg creat (ref <30)
Microalb, Ur: 0.3 mg/dL

## 2022-02-27 ENCOUNTER — Encounter: Payer: BC Managed Care – PPO | Admitting: Physical Therapy

## 2022-03-01 ENCOUNTER — Encounter: Payer: BC Managed Care – PPO | Admitting: Physical Therapy

## 2022-03-02 ENCOUNTER — Encounter: Payer: Self-pay | Admitting: Family Medicine

## 2022-03-02 NOTE — Assessment & Plan Note (Signed)
Blood pressure well-controlled and at goal today BP Readings from Last 3 Encounters:  02/24/22 128/74  12/09/21 126/80  11/23/21 138/76  Managed on combo pill amlodipine-valsartan 10-160 mg daily, good compliance Also seen regularly by cardiology Labs were recently done and reviewed, due for med refills, no treatment changes

## 2022-03-02 NOTE — Assessment & Plan Note (Signed)
Well-controlled type 2 diabetes with Trulicity and metformin Recent A1c was well below goal -  Lab Results  Component Value Date   HGBA1C 5.7 (H) 11/23/2021  She is up-to-date on diabetic foot exam, eye exam, she has statin intolerance but is on Repatha and valsartan for renal protection, she will do urine sample for urine microalbumin/creatinine ratio today No med changes Continue same meds and diet and lifestyle efforts

## 2022-03-02 NOTE — Assessment & Plan Note (Signed)
Patient appears euthyroid, recent TSH lab was in optimal range without any recent changes or concerns per patient, refill meds with same dosing

## 2022-03-02 NOTE — Progress Notes (Signed)
Name: Elizabeth Russo   MRN: [id number]    DOB: [birth year]   Date:03/02/2022       Progress Note  Chief Complaint  Patient presents with   Follow-up     Subjective:   Elizabeth Russo is a 62 y.o. female, presents to clinic for f/up and med refill on chronic conditions  HLD on repatha per specialist Lab Results  Component Value Date   CHOL 95 11/23/2021   HDL 42 (L) 11/23/2021   LDLCALC 36 11/23/2021   TRIG 85 11/23/2021   CHOLHDL 2.3 11/23/2021  Labs recently done and at goal  HTN on amlodipine valsartan, good med compliance, blood pressure well-controlled BP Readings from Last 3 Encounters:  02/24/22 128/74  12/09/21 126/80  11/23/21 138/76   Hypothyroid Lab Results  Component Value Date   TSH 2.77 11/23/2021  Labs recently done and in normal range she denies any recent dose changes, denies any fatigue, depressed mood, swelling, increased weight, hair skin bowel changes she takes 150 mcg 4 days a week and 137 mcg 3 days a week  Her back pain has improved, it got better without going to physical therapy and it has not prevented her from working   DM Lab Results  Component Value Date   HGBA1C 5.7 (H) 11/23/2021  Diabetes well-controlled managed on metformin 1 500 mg tablet daily and Trulicity She is up-to-date on foot exam, diabetic eye exam, on cholesterol management per cardiology and [facility] Only labs needed today is urine microalbumin creatinine ratio     Current Outpatient Medications:    albuterol (VENTOLIN HFA) 108 (90 Base) MCG/ACT inhaler, Inhale 2 puffs into the lungs every 6 (six) hours as needed for wheezing or shortness of breath., Disp: 8 g, Rfl: 0   aspirin EC 81 MG tablet, Take 81 mg by mouth daily. , Disp: , Rfl:    carvedilol (COREG) 25 MG tablet, Take 0.5 tablet (12.5 mg total) by mouth Two (2) times a day with meals., Disp: 90 tablet, Rfl: 1   diclofenac sodium (VOLTAREN) 1 % GEL, Apply 2 g topically daily as needed (pain)., Disp: , Rfl:     Evolocumab (REPATHA) 140 MG/ML SOSY, Inject 140 mg into the skin every 14 (fourteen) days., Disp: 2 mL, Rfl: 3   famotidine (PEPCID) 20 MG tablet, Take 1 tablet (20 mg total) by mouth Two (2) times a day., Disp: 180 tablet, Rfl: 3   fluticasone (FLONASE) 50 MCG/ACT nasal spray, Place 2 sprays into both nostrils daily., Disp: 16 g, Rfl: 1   glucose blood test strip, Test tid, Disp: 100 each, Rfl: 12   Insulin Pen Needle 32G X 4 MM MISC, Use as directed, Disp: 100 each, Rfl: PRN   levocetirizine (XYZAL) 5 MG tablet, Take 1 tablet (5 mg total) by mouth every evening., Disp: 30 tablet, Rfl: 2   metFORMIN (GLUCOPHAGE-XR) 500 MG 24 hr tablet, Take 1 tablet (500 mg total) by mouth daily with breakfast., Disp: 90 tablet, Rfl: 3   amLODipine-valsartan (EXFORGE) 10-160 MG tablet, Take 1 tablet by mouth daily., Disp: 90 tablet, Rfl: 0   Dulaglutide (TRULICITY) 1.5 0000000 SOPN, Inject 1.5 mg into the skin once a week., Disp: 2 mL, Rfl: 0   levothyroxine (SYNTHROID) 137 MCG tablet, Take 1 tablet (137 mcg total) by mouth before breakfast on Tuesdays, Thursdays, Saturdays, and Sundays., Disp: 48 tablet, Rfl: 3   levothyroxine (SYNTHROID) 150 MCG tablet, Take 1 tablet by mouth once daily on Monday, Wednesday and Friday  and take 137 mcg on the other days., Disp: 78 tablet, Rfl: 1  Patient Active Problem List   Diagnosis Date Noted   S/P cholecystectomy 01/22/2020   Calculus of gallbladder with acute cholecystitis without obstruction    Statin intolerance 03/31/2019   Heart failure with preserved left ventricular function (HFpEF) ([place]) 03/31/2019   Atrial paroxysmal tachycardia 03/31/2019   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 with body mass index (BMI) of 45.0 to 49.9 in adult [facility]) 03/31/2019   Urinary incontinence 03/31/2019   Myalgia due to statin 04/13/2017   Venous stasis 04/13/2017   History of non-ST elevation myocardial infarction (NSTEMI) 02/05/2015   Hx of percutaneous  transluminal coronary angioplasty 02/05/2015   Gout of knee 12/25/2014   Chronic combined systolic and diastolic heart failure ([place]) 05/24/2014   Coronary atherosclerosis 12/19/2013   Hyperlipidemia 12/19/2013   Essential hypertension 12/18/2013   Diabetes mellitus type 2 in obese ([place]) 12/18/2013   Hypothyroidism 12/18/2013   Dyslipidemia 02/13/2011   History of tobacco use 02/13/2011    Past Surgical History:  Procedure Laterality Date   ABDOMINAL HYSTERECTOMY     CARDIAC CATHETERIZATION     CHOLECYSTECTOMY     COLONOSCOPY WITH PROPOFOL N/A 08/13/2017   Procedure: COLONOSCOPY WITH PROPOFOL;  Surgeon: Lin Landsman, MD;  Location: [facility] ENDOSCOPY;  Service: Gastroenterology;  Laterality: N/A;   heart stent  2012    Family History  Problem Relation Age of Onset   Heart disease Mother    Diabetes Mother    Hypertension Mother    Hyperlipidemia Mother    Thyroid disease Mother    Cancer Father        colon   Diabetes Father    Hypertension Father    Diabetes Sister    Hypertension Sister    Heart disease Sister    Hyperlipidemia Sister    Kidney disease Sister    Cancer Maternal Grandfather        lung   Heart attack Paternal Grandfather    Heart disease Sister    Diabetes Sister    Heart disease Sister    Arthritis Sister     Social History   Tobacco Use   Smoking status: Former    Packs/day: 0.50    Years: 30.00    Total pack years: 15.00    Types: Cigarettes    Start date: 01/09/1978    Quit date: 03/09/2013    Years since quitting: 8.9   Smokeless tobacco: Never  Vaping Use   Vaping Use: Never used  Substance Use Topics   Alcohol use: Yes    Alcohol/week: 0.0 standard drinks of alcohol    Comment: Socially    Drug use: No     Allergies  Allergen Reactions   Atorvastatin Other (See Comments)    Other reaction(s): Muscle Pain   Hydralazine Other (See Comments)    Aggravated gout Aggravated gout   Hydrochlorothiazide Other (See Comments)     Aggravated gout   Lisinopril Cough   Pravastatin Other (See Comments)    Other reaction(s): Muscle Pain   Rosuvastatin Other (See Comments)    Other reaction(s): Muscle Pain    Health Maintenance  Topic Date Due   COVID-19 Vaccine (5 - 2023-24 season) 03/12/2022 (Originally 09/09/2021)   HEMOGLOBIN A1C  05/24/2022   OPHTHALMOLOGY EXAM  07/14/2022   COLONOSCOPY (Pts 45-70yr Insurance coverage will need to be confirmed)  08/14/2022   MAMMOGRAM  11/08/2022   Diabetic kidney evaluation - eGFR  measurement  11/24/2022   FOOT EXAM  11/24/2022   Diabetic kidney evaluation - Urine ACR  02/25/2023   DTaP/Tdap/Td (2 - Td or Tdap) 04/10/2030   INFLUENZA VACCINE  Completed   Hepatitis C Screening  Completed   HIV Screening  Completed   Zoster Vaccines- Shingrix  Completed   HPV VACCINES  Aged Out    Chart Review Today: I personally reviewed active problem list, medication list, allergies, family history, social history, health maintenance, notes from last encounter, lab results, imaging with the patient/caregiver today.   Review of Systems  Constitutional: Negative.   HENT: Negative.    Eyes: Negative.   Respiratory: Negative.    Cardiovascular: Negative.   Gastrointestinal: Negative.   Endocrine: Negative.   Genitourinary: Negative.   Musculoskeletal: Negative.   Skin: Negative.   Allergic/Immunologic: Negative.   Neurological: Negative.   Hematological: Negative.   Psychiatric/Behavioral: Negative.    All other systems reviewed and are negative.    Objective:   Vitals:   02/24/22 0841  BP: 128/74  Pulse: 62  Resp: 16  Temp: 98 F (36.7 C)  TempSrc: Oral  SpO2: 99%  Weight: 246 lb (111.6 kg)  Height: 5' 4"$  (1.626 m)    Body mass index is 42.23 kg/m.  Physical Exam Vitals and nursing note reviewed.  Constitutional:      General: She is not in acute distress.    Appearance: Normal appearance. She is well-developed. She is not ill-appearing, toxic-appearing or  diaphoretic.  HENT:     Head: Normocephalic and atraumatic.     Nose: Nose normal.  Eyes:     General:        Right eye: No discharge.        Left eye: No discharge.     Conjunctiva/sclera: Conjunctivae normal.  Neck:     Trachea: No tracheal deviation.  Cardiovascular:     Rate and Rhythm: Normal rate and regular rhythm.     Pulses: Normal pulses.     Heart sounds: Normal heart sounds.  Pulmonary:     Effort: Pulmonary effort is normal. No respiratory distress.     Breath sounds: Normal breath sounds. No stridor. No wheezing, rhonchi or rales.  Musculoskeletal:     Right lower leg: No edema.     Left lower leg: No edema.  Skin:    General: Skin is warm and dry.     Findings: No rash.  Neurological:     Mental Status: She is alert.     Motor: No abnormal muscle tone.     Coordination: Coordination normal.  Psychiatric:        Mood and Affect: Mood normal.        Behavior: Behavior normal.         Assessment & Plan:   Problem List Items Addressed This Visit       Cardiovascular and Mediastinum   Essential hypertension - Primary    Blood pressure well-controlled and at goal today BP Readings from Last 3 Encounters:  02/24/22 128/74  12/09/21 126/80  11/23/21 138/76  Managed on combo pill amlodipine-valsartan 10-160 mg daily, good compliance Also seen regularly by cardiology Labs were recently done and reviewed, due for med refills, no treatment changes       Relevant Medications   amLODipine-valsartan (EXFORGE) 10-160 MG tablet     Endocrine   Hypothyroidism (Chronic)    Patient appears euthyroid, recent TSH lab was in optimal range without any recent changes or concerns  per patient, refill meds with same dosing      Relevant Medications   levothyroxine (SYNTHROID) 137 MCG tablet   levothyroxine (SYNTHROID) 150 MCG tablet   Diabetes mellitus type 2 in obese [facility])    Well-controlled type 2 diabetes with Trulicity and metformin Recent A1c was well below  goal -  Lab Results  Component Value Date   HGBA1C 5.7 (H) 11/23/2021  She is up-to-date on diabetic foot exam, eye exam, she has statin intolerance but is on Repatha and valsartan for renal protection, she will do urine sample for urine microalbumin/creatinine ratio today No med changes Continue same meds and diet and lifestyle efforts       Relevant Medications   amLODipine-valsartan (EXFORGE) 10-160 MG tablet   Dulaglutide (TRULICITY) 1.5 0000000 SOPN   Other Relevant Orders   Microalbumin / creatinine urine ratio (Completed)     Other   Hyperlipidemia    Well-controlled on Repatha per specialist Last lipids reviewed Lab Results  Component Value Date   CHOL 95 11/23/2021   HDL 42 (L) 11/23/2021   LDLCALC 36 11/23/2021   TRIG 85 11/23/2021   CHOLHDL 2.3 11/23/2021  LDL at goal       Relevant Medications   amLODipine-valsartan (EXFORGE) 10-160 MG tablet   Myalgia due to statin    Statin myalgia and intolerance after trying multiple medications, she does have cholesterol well-controlled with Repatha and is seeing specialist for this last LDL is at goal      Other Visit Diagnoses     Encounter for medication monitoring       Relevant Medications   amLODipine-valsartan (EXFORGE) 10-160 MG tablet   Dulaglutide (TRULICITY) 1.5 0000000 SOPN   levothyroxine (SYNTHROID) 137 MCG tablet   levothyroxine (SYNTHROID) 150 MCG tablet   Other Relevant Orders   Microalbumin / creatinine urine ratio (Completed)        Return in about 6 months (around 08/25/2022) for Routine follow-up.   Delsa Grana, PA-C 03/02/22 5:15 PM

## 2022-03-02 NOTE — Assessment & Plan Note (Signed)
Well-controlled on Repatha per specialist Last lipids reviewed Lab Results  Component Value Date   CHOL 95 11/23/2021   HDL 42 (L) 11/23/2021   LDLCALC 36 11/23/2021   TRIG 85 11/23/2021   CHOLHDL 2.3 11/23/2021  LDL at goal

## 2022-03-02 NOTE — Assessment & Plan Note (Signed)
Statin myalgia and intolerance after trying multiple medications, she does have cholesterol well-controlled with Repatha and is seeing specialist for this last LDL is at goal

## 2022-03-06 ENCOUNTER — Encounter: Payer: BC Managed Care – PPO | Admitting: Physical Therapy

## 2022-03-09 ENCOUNTER — Encounter: Payer: BC Managed Care – PPO | Admitting: Physical Therapy

## 2022-03-13 ENCOUNTER — Encounter: Payer: Self-pay | Admitting: Cardiology

## 2022-03-13 ENCOUNTER — Ambulatory Visit: Payer: BC Managed Care – PPO | Attending: Cardiology | Admitting: Cardiology

## 2022-03-13 VITALS — BP 130/74 | HR 68 | Ht 64.0 in | Wt 247.4 lb

## 2022-03-13 DIAGNOSIS — I10 Essential (primary) hypertension: Secondary | ICD-10-CM | POA: Diagnosis not present

## 2022-03-13 DIAGNOSIS — E7800 Pure hypercholesterolemia, unspecified: Secondary | ICD-10-CM | POA: Diagnosis not present

## 2022-03-13 DIAGNOSIS — I4719 Other supraventricular tachycardia: Secondary | ICD-10-CM

## 2022-03-13 DIAGNOSIS — I2510 Atherosclerotic heart disease of native coronary artery without angina pectoris: Secondary | ICD-10-CM

## 2022-03-13 MED ORDER — OZEMPIC (0.25 OR 0.5 MG/DOSE) 2 MG/1.5ML ~~LOC~~ SOPN: PEN_INJECTOR | SUBCUTANEOUS | 1 refills | Status: DC

## 2022-03-13 NOTE — Progress Notes (Signed)
Cardiology Office Note:    Date:  03/13/2022   ID:  Elizabeth Russo, DOB 1960/05/14, MRN [id number]  PCP:  Delsa Grana, PA-C  Cardiologist:  Kate Sable, MD  Electrophysiologist:  None   Referring MD: Delsa Grana, PA-C    Chief Complaint  Patient presents with   Follow-up    3 month f/u, no new cardiac concerns     History of Present Illness:    Elizabeth Russo is a 62 y.o. female with a hx of atrial tachycardia, hypertension, CAD,(NSTEMI with PCI/DES to OM2, mid RCA occ, January 2013), diabetes, who presents for follow-up.    Denies chest pain or shortness of breath, symptoms of palpitations overall improved with carvedilol.  Mounjaro not approved by insurance.  Overall doing okay, no new concerns at this time.  Denies chest pain.  Trying to lose weight.  Prior notes Previously follow-up at [facility].  diagnosed with NSTEMI 2013. Left heart cath was performed which showed an occluded mid RCA, 90% stenosis in a small OM1 branch.  50% proximal RCA, distal LAD large OM 2.  PCI was performed to the OM 2 branch with a drug-eluting stent.   In 2018, she had a stress echocardiogram, exercising for 6 minutes achieving 10.1 METS with no wall motion abnormalities.  Last echocardiogram in 2013 showed EF of 45%, with mild concentric LVH.   She has a history of myalgia on statin.  She is currently taking Zetia and Praluent for hyperlipidemia..   Echo 12/2018 showed preserved ejection fraction, EF 60 to 65%, impaired relaxation.  Did not tolerate verapamil in the past, Coreg was started. Lexiscan Myoview 2021 showed no ischemia. .  Past Medical History:  Diagnosis Date   (HFpEF) heart failure with preserved ejection fraction (HCC)    Atrial fibrillation ([place]) 02/13/2011   Breast mass, right 04/25/2014   CAD (coronary artery disease)    Diabetes mellitus without complication ([place])    Diverticulosis    Family history of colon cancer in father    Goiter 03/19/2014   Hearing loss  08/25/2011   HLD (hyperlipidemia)    Hypertension    Hypothyroid    Localized enlarged lymph nodes 10/23/2014   cervical    Low back pain 12/25/2014   Morbid obesity ([place]) 06/26/2014   Myalgia due to statin 04/13/2017   Intolerant to three different statins   NSTEMI (non-ST elevated myocardial infarction) ([place]) 01/2011   Subendocardial   Personal history of colonic polyps    Vertigo     Past Surgical History:  Procedure Laterality Date   ABDOMINAL HYSTERECTOMY     CARDIAC CATHETERIZATION     CHOLECYSTECTOMY     COLONOSCOPY WITH PROPOFOL N/A 08/13/2017   Procedure: COLONOSCOPY WITH PROPOFOL;  Surgeon: Lin Landsman, MD;  Location: [facility] ENDOSCOPY;  Service: Gastroenterology;  Laterality: N/A;   heart stent  2012    Current Medications: Current Meds  Medication Sig   albuterol (VENTOLIN HFA) 108 (90 Base) MCG/ACT inhaler Inhale 2 puffs into the lungs every 6 (six) hours as needed for wheezing or shortness of breath.   amLODipine-valsartan (EXFORGE) 10-160 MG tablet Take 1 tablet by mouth daily.   aspirin EC 81 MG tablet Take 81 mg by mouth daily.    carvedilol (COREG) 25 MG tablet Take 0.5 tablet (12.5 mg total) by mouth Two (2) times a day with meals.   diclofenac sodium (VOLTAREN) 1 % GEL Apply 2 g topically daily as needed (pain).   Evolocumab (REPATHA) 140  MG/ML SOSY Inject 140 mg into the skin every 14 (fourteen) days.   famotidine (PEPCID) 20 MG tablet Take 1 tablet (20 mg total) by mouth Two (2) times a day.   fluticasone (FLONASE) 50 MCG/ACT nasal spray Place 2 sprays into both nostrils daily.   glucose blood test strip Test tid   Insulin Pen Needle 32G X 4 MM MISC Use as directed   levocetirizine (XYZAL) 5 MG tablet Take 1 tablet (5 mg total) by mouth every evening.   levothyroxine (SYNTHROID) 137 MCG tablet Take 1 tablet (137 mcg total) by mouth before breakfast on Tuesdays, Thursdays, Saturdays, and Sundays.   levothyroxine (SYNTHROID) 150 MCG tablet Take 1 tablet by  mouth once daily on Monday, Wednesday and Friday and take 137 mcg on the other days.   metFORMIN (GLUCOPHAGE-XR) 500 MG 24 hr tablet Take 1 tablet (500 mg total) by mouth daily with breakfast.   Semaglutide,0.25 or 0.'5MG'$ /DOS, (OZEMPIC, 0.25 OR 0.5 MG/DOSE,) 2 MG/1.5ML SOPN Inject 0.25 [place] once weekly for 4 weeks, then inject 0.5 mg [place] for 4 weeks.     Allergies:   Atorvastatin, Hydralazine, Hydrochlorothiazide, Lisinopril, Pravastatin, and Rosuvastatin   Social History   Socioeconomic History   Marital status: Widowed    Spouse name: Not on file   Number of children: 2   Years of education: Not on file   Highest education level: High school graduate  Occupational History   Not on file  Tobacco Use   Smoking status: Former    Packs/day: 0.50    Years: 30.00    Total pack years: 15.00    Types: Cigarettes    Start date: 01/09/1978    Quit date: 03/09/2013    Years since quitting: 9.0   Smokeless tobacco: Never  Vaping Use   Vaping Use: Never used  Substance and Sexual Activity   Alcohol use: Yes    Alcohol/week: 0.0 standard drinks of alcohol    Comment: Socially    Drug use: No   Sexual activity: Yes    Birth control/protection: Post-menopausal  Other Topics Concern   Not on file  Social History Narrative   Not on file   Social Determinants of Health   Financial Resource Strain: Low Risk  (11/01/2020)   Overall Financial Resource Strain (CARDIA)    Difficulty of Paying Living Expenses: Not hard at all  Food Insecurity: No Food Insecurity (11/01/2020)   Hunger Vital Sign    Worried About Running Out of Food in the Last Year: Never true    Ran Out of Food in the Last Year: Never true  Transportation Needs: No Transportation Needs (11/01/2020)   PRAPARE - Hydrologist (Medical): No    Lack of Transportation (Non-Medical): No  Physical Activity: Insufficiently Active (11/01/2020)   Exercise Vital Sign    Days of Exercise per Week: 2 days     Minutes of Exercise per Session: 20 min  Stress: No Stress Concern Present (11/01/2020)   [place]    Feeling of Stress : Only a little  Social Connections: Moderately Isolated (11/01/2020)   Social Connection and Isolation Panel [NHANES]    Frequency of Communication with Friends and Family: More than three times a week    Frequency of Social Gatherings with Friends and Family: Once a week    Attends Religious Services: 1 to 4 times per year    Active Member of Clubs or Organizations: No  Attends Archivist Meetings: Never    Marital Status: Widowed     Family History: The patient's family history includes Arthritis in her sister; Cancer in her father and maternal grandfather; Diabetes in her father, mother, sister, and sister; Heart attack in her paternal grandfather; Heart disease in her mother, sister, sister, and sister; Hyperlipidemia in her mother and sister; Hypertension in her father, mother, and sister; Kidney disease in her sister; Thyroid disease in her mother.  ROS:   Please see the history of present illness.     All other systems reviewed and are negative.  EKGs/Labs/Other Studies Reviewed:    EKG:  EKG not ordered today.   Recent Labs: 11/23/2021: ALT 16; BUN 12; Creat 0.83; Hemoglobin 14.1; Platelets 225; Potassium 4.4; Sodium 140; TSH 2.77  Recent Lipid Panel    Component Value Date/Time   CHOL 95 11/23/2021 0849   CHOL 184 05/04/2015 0858   TRIG 85 11/23/2021 0849   HDL 42 (L) 11/23/2021 0849   HDL 38 (L) 05/04/2015 0858   CHOLHDL 2.3 11/23/2021 0849   VLDL 21 04/04/2016 0932   LDLCALC 36 11/23/2021 0849    Physical Exam:    VS:  BP 130/74 (BP Location: Left Arm, Patient Position: Sitting, Cuff Size: Large)   Pulse 68   Ht '5\' 4"'$  (1.626 m)   Wt 247 lb 6.4 oz (112.2 kg)   LMP  (LMP Unknown)   SpO2 98%   BMI 42.47 kg/m     Wt Readings from Last 3 Encounters:  03/13/22  247 lb 6.4 oz (112.2 kg)  02/24/22 246 lb (111.6 kg)  01/05/22 246 lb (111.6 kg)     GEN:  Well nourished, well developed in no acute distress, obese HEENT: Normal NECK: No JVD; No carotid bruits CARDIAC: RRR, no murmurs, rubs, gallops RESPIRATORY:  Clear to auscultation without rales, wheezing or rhonchi  ABDOMEN: Soft, non-tender, non-distended MUSCULOSKELETAL:  No edema; No deformity  SKIN: Warm and dry NEUROLOGIC:  Alert and oriented x 3 PSYCHIATRIC:  Normal affect   ASSESSMENT:    1. Atrial tachycardia   2. Primary hypertension   3. Coronary artery disease involving native coronary artery of native heart without angina pectoris   4. Pure hypercholesterolemia   5. Morbid obesity ([place])    PLAN:    In order of problems listed above:  Atrial tachycardia, symptoms controlled with carvedilol.  Continue carvedilol 12.5 mg twice daily. hypertension, BP controlled.  Continue  Amlodipine-valsartan combo, Coreg. CAD/DES to [facility] 2013.  LDL at goal.  Denies chest pain.  Last EF 60 to 65%. Cont Aspirin, Praluent Hyperlipidemia. Not tolerant to statins.  cont Praluent. Morbid obesity, diabetic.  Low-cholesterol diet, weight loss advised.  Start Ozempic.  Follow-up in 12 months.  This note was generated in part or whole with voice recognition software. Voice recognition is usually quite accurate but there are transcription errors that can and very often do occur. I apologize for any typographical errors that were not detected and corrected.   Medication Adjustments/Labs and Tests Ordered: Current medicines are reviewed at length with the patient today.  Concerns regarding medicines are outlined above.  No orders of the defined types were placed in this encounter.   Meds ordered this encounter  Medications   Semaglutide,0.25 or 0.'5MG'$ /DOS, (OZEMPIC, 0.25 OR 0.5 MG/DOSE,) 2 MG/1.5ML SOPN    Sig: Inject 0.25 [place] once weekly for 4 weeks, then inject 0.5 mg [place] for 4 weeks.    Dispense:   1.5  mL    Refill:  1     Patient Instructions  Medication Instructions:   Start taking Ozempic:  Inject 0.25 MG into skin once a week, for 4 weeks.    Inject 0.50 MG into skin once a week, for 4 weeks. (Call or send [place] a MyChart message when you are on week 2, so we can send your next dose in for you).    Inject 1.0 MG into skin once a week, for 4 weeks.    Inject 2.0 MG into skin once a week, and continue at this dose.    *If you need a refill on your cardiac medications before your next appointment, please call your pharmacy*   Lab Work:  None Ordered  If you have labs (blood work) drawn today and your tests are completely normal, you will receive your results only by: [place] (if you have MyChart) OR A paper copy in the mail If you have any lab test that is abnormal or we need to change your treatment, we will call you to review the results.   Testing/Procedures:  None Ordered   Follow-Up: At [facility], you and your health needs are our priority.  As part of our continuing mission to provide you with exceptional heart care, we have created designated Provider Care Teams.  These Care Teams include your primary Cardiologist (physician) and Advanced Practice Providers (APPs -  Physician Assistants and Nurse Practitioners) who all work together to provide you with the care you need, when you need it.  We recommend signing up for the patient portal called "MyChart".  Sign up information is provided on this After Visit Summary.  MyChart is used to connect with patients for Virtual Visits (Telemedicine).  Patients are able to view lab/test results, encounter notes, upcoming appointments, etc.  Non-urgent messages can be sent to your provider as well.   To learn more about what you can do with MyChart, go to [email or web address].    Your next appointment:   12 month(s)  Provider:   You may see Kate Sable, MD or one of the following Advanced  Practice Providers on your designated Care Team:   Murray Hodgkins, NP Christell Faith, PA-C Cadence Kathlen Mody, PA-C Gerrie Nordmann, NP    Signed, Kate Sable, MD  03/13/2022 10:01 AM    [place]

## 2022-03-13 NOTE — Patient Instructions (Signed)
Medication Instructions:   Start taking Ozempic:  Inject 0.25 MG into skin once a week, for 4 weeks.    Inject 0.50 MG into skin once a week, for 4 weeks. (Call or send [place] a MyChart message when you are on week 2, so we can send your next dose in for you).    Inject 1.0 MG into skin once a week, for 4 weeks.    Inject 2.0 MG into skin once a week, and continue at this dose.    *If you need a refill on your cardiac medications before your next appointment, please call your pharmacy*   Lab Work:  None Ordered  If you have labs (blood work) drawn today and your tests are completely normal, you will receive your results only by: [place] (if you have MyChart) OR A paper copy in the mail If you have any lab test that is abnormal or we need to change your treatment, we will call you to review the results.   Testing/Procedures:  None Ordered   Follow-Up: At [facility], you and your health needs are our priority.  As part of our continuing mission to provide you with exceptional heart care, we have created designated Provider Care Teams.  These Care Teams include your primary Cardiologist (physician) and Advanced Practice Providers (APPs -  Physician Assistants and Nurse Practitioners) who all work together to provide you with the care you need, when you need it.  We recommend signing up for the patient portal called "MyChart".  Sign up information is provided on this After Visit Summary.  MyChart is used to connect with patients for Virtual Visits (Telemedicine).  Patients are able to view lab/test results, encounter notes, upcoming appointments, etc.  Non-urgent messages can be sent to your provider as well.   To learn more about what you can do with MyChart, go to [email or web address].    Your next appointment:   12 month(s)  Provider:   You may see Kate Sable, MD or one of the following Advanced Practice Providers on your designated Care Team:    Murray Hodgkins, NP Christell Faith, PA-C Cadence Kathlen Mody, PA-C Gerrie Nordmann, NP

## 2022-04-19 ENCOUNTER — Other Ambulatory Visit: Payer: Self-pay

## 2022-04-19 MED ORDER — REPATHA 140 MG/ML ~~LOC~~ SOSY: 140.0000 mg | PREFILLED_SYRINGE | SUBCUTANEOUS | 3 refills | Status: DC

## 2022-05-04 ENCOUNTER — Ambulatory Visit: Payer: BC Managed Care – PPO | Admitting: Family Medicine

## 2022-05-04 NOTE — Progress Notes (Signed)
Patient ID: Elizabeth Russo, female    DOB: 06-29-1960, 62 y.o.   MRN: [id number]  PCP: Danelle Berry, PA-C  Chief Complaint  Patient presents with   Nasal Congestion    A lot of facial pressure onset over a week   Cough    Subjective:   Elizabeth Russo is a 62 y.o. female, presents to clinic with CC of the following:   Hx of congestions/sinus infections in the past, bronchitis with URI, suspected underlying allergy triggers - she has been prescribed flonase and xyzal in the last couple months - She has not been on those med recently, sx started a few weeks ago and have gotten much more severe this week. URI  This is a new problem. The current episode started 1 to 4 weeks ago (7+ days). The problem has been rapidly worsening. There has been no fever. Associated symptoms include congestion, coughing, headaches, rhinorrhea, sinus pain, sneezing, a sore throat and wheezing. Pertinent negatives include no abdominal pain, chest pain, diarrhea, dysuria, ear pain, joint pain, joint swelling, nausea, neck pain, plugged ear sensation, rash, swollen glands or vomiting. Associated symptoms comments: Hot/cold chills. She has tried inhaler use (mucinex) for the symptoms. The treatment provided no relief.      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 with body mass index (BMI) of 45.0 to 49.9 in adult 03/31/2019   Urinary incontinence 03/31/2019   Myalgia due to statin 04/13/2017   Venous stasis 04/13/2017   History of non-ST elevation myocardial infarction (NSTEMI) 02/05/2015   Hx of percutaneous transluminal coronary angioplasty 02/05/2015   Gout of knee 12/25/2014    Chronic combined systolic and diastolic heart failure (HCC) 05/24/2014   Coronary atherosclerosis 12/19/2013   Hyperlipidemia 12/19/2013   Essential hypertension 12/18/2013   Diabetes mellitus type 2 in obese 12/18/2013   Hypothyroidism 12/18/2013   Dyslipidemia 02/13/2011   History of tobacco use 02/13/2011      Current Outpatient Medications:    albuterol (VENTOLIN HFA) 108 (90 Base) MCG/ACT inhaler, Inhale 2 puffs into the lungs every 6 (six) hours as needed for wheezing or shortness of breath., Disp: 8 g, Rfl: 0   amLODipine-valsartan (EXFORGE) 10-160 MG tablet, Take 1 tablet by mouth daily., Disp: 90 tablet, Rfl: 0   aspirin EC 81 MG tablet, Take 81 mg by mouth daily. , Disp: , Rfl:    carvedilol (COREG) 25 MG tablet, Take 0.5 tablet (12.5 mg total) by mouth Two (2) times a day with meals., Disp: 90 tablet, Rfl: 1   diclofenac sodium (VOLTAREN) 1 % GEL, Apply 2 g topically daily as needed (pain)., Disp: , Rfl:    Dulaglutide (TRULICITY) 1.5 MG/0.5ML SOPN, Inject 1.5 mg into the skin once a week. (Patient not taking: Reported on 03/13/2022), Disp: 2 mL, Rfl: 0   Evolocumab (REPATHA) 140 MG/ML SOSY, Inject 140 mg into the skin every 14 (fourteen) days., Disp: 2 mL, Rfl: 3   famotidine (PEPCID) 20 MG tablet, Take 1 tablet (20 mg total) by mouth Two (2) times a day., Disp: 180 tablet, Rfl: 3   fluticasone (FLONASE) 50 MCG/ACT nasal spray, Place 2 sprays into both nostrils daily., Disp: 16 g, Rfl: 1  glucose blood test strip, Test tid, Disp: 100 each, Rfl: 12   Insulin Pen Needle 32G X 4 MM MISC, Use as directed, Disp: 100 each, Rfl: PRN   levocetirizine (XYZAL) 5 MG tablet, Take 1 tablet (5 mg total) by mouth every evening., Disp: 30 tablet, Rfl: 2   levothyroxine (SYNTHROID) 137 MCG tablet, Take 1 tablet (137 mcg total) by mouth before breakfast on Tuesdays, Thursdays, Saturdays, and Sundays., Disp: 48 tablet, Rfl: 3   levothyroxine (SYNTHROID) 150 MCG tablet, Take 1 tablet by mouth once  daily on Monday, Wednesday and Friday and take 137 mcg on the other days., Disp: 78 tablet, Rfl: 1   metFORMIN (GLUCOPHAGE-XR) 500 MG 24 hr tablet, Take 1 tablet (500 mg total) by mouth daily with breakfast., Disp: 90 tablet, Rfl: 3   Semaglutide,0.25 or 0.5MG /DOS, (OZEMPIC, 0.25 OR 0.5 MG/DOSE,) 2 MG/1.5ML SOPN, Inject 0.25 [place] once weekly for 4 weeks, then inject 0.5 mg [place] for 4 weeks., Disp: 1.5 mL, Rfl: 1   Allergies  Allergen Reactions   Atorvastatin Other (See Comments)    Other reaction(s): Muscle Pain   Hydralazine Other (See Comments)    Aggravated gout Aggravated gout   Hydrochlorothiazide Other (See Comments)    Aggravated gout   Lisinopril Cough   Pravastatin Other (See Comments)    Other reaction(s): Muscle Pain   Rosuvastatin Other (See Comments)    Other reaction(s): Muscle Pain     Social History   Tobacco Use   Smoking status: Former    Packs/day: 0.50    Years: 30.00    Additional pack years: 0.00    Total pack years: 15.00    Types: Cigarettes    Start date: 01/09/1978    Quit date: 03/09/2013    Years since quitting: 9.1   Smokeless tobacco: Never  Vaping Use   Vaping Use: Never used  Substance Use Topics   Alcohol use: Yes    Alcohol/week: 0.0 standard drinks of alcohol    Comment: Socially    Drug use: No      Chart Review Today: I personally reviewed active problem list, medication list, allergies, family history, social history, health maintenance, notes from last encounter, lab results, imaging with the patient/caregiver today.   Review of Systems  Constitutional: Negative.   HENT:  Positive for congestion, rhinorrhea, sinus pain, sneezing and sore throat. Negative for ear pain.   Eyes: Negative.   Respiratory:  Positive for cough and wheezing.   Cardiovascular: Negative.  Negative for chest pain.  Gastrointestinal: Negative.  Negative for abdominal pain, diarrhea, nausea and vomiting.  Endocrine: Negative.   Genitourinary: Negative.   Negative for dysuria.  Musculoskeletal: Negative.  Negative for joint pain and neck pain.  Skin: Negative.  Negative for rash.  Allergic/Immunologic: Negative.   Neurological:  Positive for headaches.  Hematological: Negative.   Psychiatric/Behavioral: Negative.    All other systems reviewed and are negative.      Objective:   There were no vitals filed for this visit.  There is no height or weight on file to calculate BMI.  Physical Exam Vitals and nursing note reviewed.  Constitutional:      General: She is not in acute distress.    Appearance: Normal appearance. She is well-developed. She is obese. She is not ill-appearing, toxic-appearing or diaphoretic.  HENT:     Head: Normocephalic and atraumatic.     Right Ear: External ear normal.     Left Ear: External ear normal.  Nose: Mucosal edema, congestion and rhinorrhea present.     Right Sinus: Maxillary sinus tenderness and frontal sinus tenderness present.     Left Sinus: Maxillary sinus tenderness and frontal sinus tenderness present.     Mouth/Throat:     Pharynx: Uvula midline.  Eyes:     General: Lids are normal.     Conjunctiva/sclera: Conjunctivae normal.     Pupils: Pupils are equal, round, and reactive to light.  Neck:     Trachea: Phonation normal. No tracheal deviation.  Cardiovascular:     Rate and Rhythm: Normal rate and regular rhythm.     Pulses: Normal pulses.          Radial pulses are 2+ on the right side and 2+ on the left side.       Posterior tibial pulses are 2+ on the right side and 2+ on the left side.     Heart sounds: Normal heart sounds. No murmur heard.    No friction rub. No gallop.  Pulmonary:     Effort: Pulmonary effort is normal. No respiratory distress.     Breath sounds: Normal breath sounds. No stridor. No wheezing, rhonchi or rales.  Chest:     Chest wall: No tenderness.  Abdominal:     General: Bowel sounds are normal. There is no distension.     Palpations: Abdomen is  soft.     Tenderness: There is no abdominal tenderness. There is no guarding or rebound.  Musculoskeletal:        General: No deformity. Normal range of motion.     Cervical back: Normal range of motion and neck supple.  Lymphadenopathy:     Cervical: No cervical adenopathy.  Skin:    General: Skin is warm and dry.     Capillary Refill: Capillary refill takes less than 2 seconds.     Coloration: Skin is not pale.     Findings: No rash.  Neurological:     Mental Status: She is alert and oriented to person, place, and time.     Motor: No abnormal muscle tone.     Gait: Gait normal.  Psychiatric:        Speech: Speech normal.        Behavior: Behavior normal.      Results for orders placed or performed in visit on 02/28/22  HM DIABETES EYE EXAM  Result Value Ref Range   HM Diabetic Eye Exam No Retinopathy No Retinopathy       Assessment & Plan:     ICD-10-CM   1. Acute bacterial rhinosinusitis  J01.90 doxycycline (VIBRA-TABS) 100 MG tablet   B96.89    sx 2-3 weeks significantly worsening pain, sinus ttp, abx indicated    2. Nasal congestion  R09.81 fluticasone (FLONASE) 50 MCG/ACT nasal spray    levocetirizine (XYZAL) 5 MG tablet   likely baseline allergies under managed with acute viral URI causing worse sx, pt encouraged to restart daily allergy/sinus meds    3. Medication refill  Z76.0 fluticasone (FLONASE) 50 MCG/ACT nasal spray    levocetirizine (XYZAL) 5 MG tablet    albuterol (VENTOLIN HFA) 108 (90 Base) MCG/ACT inhaler   inhaler as needed, not currently in chest with SOB or wheeze      work note sent to pt through [employer] as needed   Danelle Berry, PA-C 05/04/22 6:04 PM

## 2022-05-04 NOTE — Progress Notes (Deleted)
Patient ID: UNIQUE SILLAS, female    DOB: 11/15/1960, 62 y.o.   MRN: [id number]  PCP: Danelle Berry, PA-C  No chief complaint on file.   Subjective:   ARSHIA SPELLMAN is a 62 y.o. 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 with body mass index (BMI) of 45.0 to 49.9 in adult 03/31/2019   Urinary incontinence 03/31/2019   Myalgia due to statin 04/13/2017   Venous stasis 04/13/2017   History of non-ST elevation myocardial infarction (NSTEMI) 02/05/2015   Hx of percutaneous transluminal coronary angioplasty 02/05/2015   Gout of knee 12/25/2014   Chronic combined systolic and diastolic heart failure 05/24/2014   Coronary atherosclerosis 12/19/2013   Hyperlipidemia 12/19/2013   Essential hypertension 12/18/2013   Diabetes mellitus type 2 in obese 12/18/2013   Hypothyroidism 12/18/2013   Dyslipidemia 02/13/2011   History of tobacco use 02/13/2011      Current Outpatient Medications:    albuterol (VENTOLIN HFA) 108 (90 Base) MCG/ACT inhaler, Inhale 2 puffs into the lungs every 6 (six) hours as needed for wheezing or shortness of breath., Disp: 8 g, Rfl: 0   amLODipine-valsartan (EXFORGE) 10-160 MG tablet, Take 1 tablet by mouth daily., Disp: 90 tablet, Rfl: 0   aspirin EC 81 MG tablet, Take 81 mg by mouth daily. , Disp: , Rfl:    carvedilol (COREG) 25 MG tablet, Take 0.5 tablet (12.5 mg total) by  mouth Two (2) times a day with meals., Disp: 90 tablet, Rfl: 1   diclofenac sodium (VOLTAREN) 1 % GEL, Apply 2 g topically daily as needed (pain)., Disp: , Rfl:    Dulaglutide (TRULICITY) 1.5 MG/0.5ML SOPN, Inject 1.5 mg into the skin once a week. (Patient not taking: Reported on 03/13/2022), Disp: 2 mL, Rfl: 0   Evolocumab (REPATHA) 140 MG/ML SOSY, Inject 140 mg into the skin every 14 (fourteen) days., Disp: 2 mL, Rfl: 3   famotidine (PEPCID) 20 MG tablet, Take 1 tablet (20 mg total) by mouth Two (2) times a day., Disp: 180 tablet, Rfl: 3   fluticasone (FLONASE) 50 MCG/ACT nasal spray, Place 2 sprays into both nostrils daily., Disp: 16 g, Rfl: 1   glucose blood test strip, Test tid, Disp: 100 each, Rfl: 12   Insulin Pen Needle 32G X 4 MM MISC, Use as directed, Disp: 100 each, Rfl: PRN   levocetirizine (XYZAL) 5 MG tablet, Take 1 tablet (5 mg total) by mouth every evening., Disp: 30 tablet, Rfl: 2   levothyroxine (SYNTHROID) 137 MCG tablet, Take 1 tablet (137 mcg total) by mouth before breakfast on Tuesdays, Thursdays, Saturdays, and Sundays., Disp: 48 tablet, Rfl: 3   levothyroxine (SYNTHROID) 150 MCG tablet, Take 1 tablet by mouth once daily on Monday, Wednesday and Friday and take 137 mcg on the other days., Disp: 78 tablet, Rfl: 1   metFORMIN (GLUCOPHAGE-XR) 500 MG 24 hr tablet, Take 1 tablet (500 mg total) by  mouth daily with breakfast., Disp: 90 tablet, Rfl: 3   Semaglutide,0.25 or 0.5MG /DOS, (OZEMPIC, 0.25 OR 0.5 MG/DOSE,) 2 MG/1.5ML SOPN, Inject 0.25 [place] once weekly for 4 weeks, then inject 0.5 mg [place] for 4 weeks., Disp: 1.5 mL, Rfl: 1   Allergies  Allergen Reactions   Atorvastatin Other (See Comments)    Other reaction(s): Muscle Pain   Hydralazine Other (See Comments)    Aggravated gout Aggravated gout   Hydrochlorothiazide Other (See Comments)    Aggravated gout   Lisinopril Cough   Pravastatin Other (See Comments)    Other reaction(s): Muscle Pain   Rosuvastatin Other (See  Comments)    Other reaction(s): Muscle Pain     Social History   Tobacco Use   Smoking status: Former    Packs/day: 0.50    Years: 30.00    Additional pack years: 0.00    Total pack years: 15.00    Types: Cigarettes    Start date: 01/09/1978    Quit date: 03/09/2013    Years since quitting: 9.1   Smokeless tobacco: Never  Vaping Use   Vaping Use: Never used  Substance Use Topics   Alcohol use: Yes    Alcohol/week: 0.0 standard drinks of alcohol    Comment: Socially    Drug use: No      Chart Review Today: ***  Review of Systems     Objective:   There were no vitals filed for this visit.  There is no height or weight on file to calculate BMI.  Physical Exam   Results for orders placed or performed in visit on 02/28/22  HM DIABETES EYE EXAM  Result Value Ref Range   HM Diabetic Eye Exam No Retinopathy No Retinopathy       Assessment & Plan:   ***     Danelle Berry, PA-C 05/04/22 10:08 AM

## 2022-05-05 ENCOUNTER — Encounter: Payer: Self-pay | Admitting: Family Medicine

## 2022-05-05 ENCOUNTER — Ambulatory Visit: Payer: BC Managed Care – PPO | Admitting: Family Medicine

## 2022-05-05 VITALS — BP 128/72 | HR 68 | Temp 97.9°F | Resp 16 | Ht 64.0 in | Wt 242.2 lb

## 2022-05-05 DIAGNOSIS — Z760 Encounter for issue of repeat prescription: Secondary | ICD-10-CM

## 2022-05-05 DIAGNOSIS — J019 Acute sinusitis, unspecified: Secondary | ICD-10-CM | POA: Diagnosis not present

## 2022-05-05 DIAGNOSIS — R0981 Nasal congestion: Secondary | ICD-10-CM

## 2022-05-05 DIAGNOSIS — B9689 Other specified bacterial agents as the cause of diseases classified elsewhere: Secondary | ICD-10-CM | POA: Diagnosis not present

## 2022-05-05 MED ORDER — LEVOCETIRIZINE DIHYDROCHLORIDE 5 MG PO TABS: 5.0000 mg | ORAL_TABLET | Freq: Every evening | ORAL | 1 refills | Status: DC

## 2022-05-05 MED ORDER — DOXYCYCLINE HYCLATE 100 MG PO TABS: 100.0000 mg | ORAL_TABLET | Freq: Two times a day (BID) | ORAL | 0 refills | Status: AC

## 2022-05-05 MED ORDER — ALBUTEROL SULFATE HFA 108 (90 BASE) MCG/ACT IN AERS: 2.0000 | INHALATION_SPRAY | Freq: Four times a day (QID) | RESPIRATORY_TRACT | 0 refills | Status: AC | PRN

## 2022-05-05 MED ORDER — FLUTICASONE PROPIONATE 50 MCG/ACT NA SUSP: 2.0000 | Freq: Every day | NASAL | 2 refills | Status: DC

## 2022-05-19 ENCOUNTER — Other Ambulatory Visit: Payer: Self-pay

## 2022-05-19 MED ORDER — SEMAGLUTIDE (1 MG/DOSE) 4 MG/3ML ~~LOC~~ SOPN: 1.0000 mg | PEN_INJECTOR | SUBCUTANEOUS | 0 refills | Status: DC

## 2022-05-23 ENCOUNTER — Other Ambulatory Visit: Payer: Self-pay | Admitting: Family Medicine

## 2022-05-23 DIAGNOSIS — Z5181 Encounter for therapeutic drug level monitoring: Secondary | ICD-10-CM

## 2022-05-23 DIAGNOSIS — I10 Essential (primary) hypertension: Secondary | ICD-10-CM

## 2022-05-29 ENCOUNTER — Other Ambulatory Visit: Payer: Self-pay | Admitting: Family Medicine

## 2022-05-29 DIAGNOSIS — Z5181 Encounter for therapeutic drug level monitoring: Secondary | ICD-10-CM

## 2022-05-29 DIAGNOSIS — E039 Hypothyroidism, unspecified: Secondary | ICD-10-CM

## 2022-06-23 ENCOUNTER — Telehealth: Payer: Self-pay | Admitting: Cardiology

## 2022-06-23 ENCOUNTER — Other Ambulatory Visit: Payer: Self-pay

## 2022-06-23 MED ORDER — SEMAGLUTIDE (1 MG/DOSE) 4 MG/3ML ~~LOC~~ SOPN: 1.0000 mg | PEN_INJECTOR | SUBCUTANEOUS | 0 refills | Status: DC

## 2022-06-23 NOTE — Telephone Encounter (Signed)
Spoke with pt she mentioned that she is needing a refill on Ozempic inj. Pt mentioned that she has only had one week of Ozempic 1.0 mg and is needing refill for the remainder 3 wks. Please advise for refill and if ok to refill.

## 2022-06-23 NOTE — Telephone Encounter (Signed)
*  STAT* If patient is at the pharmacy, call can be transferred to refill team.   1. Which medications need to be refilled? (please list name of each medication and dose if known)  Ozempic:   Inject 0.25 MG into skin once a week, for 4 weeks.    Inject 0.50 MG into skin once a week, for 4 weeks. (Call or send [place] a MyChart message when you are on week 2, so we can send your next dose in for you).    Inject 1.0 MG into skin once a week, for 4 weeks.    Inject 2.0 MG into skin once a week, and continue at this dose.  2. Which pharmacy/location (including street and city if local pharmacy) is medication to be sent to? [facility] - [place], [place] - [address]    3. Do they need a 30 day or 90 day supply? 30 days   Pt out of medication.

## 2022-06-23 NOTE — Telephone Encounter (Signed)
Semaglutide, 1 MG/DOSE, 4 MG/3ML SOPN 3 mL 0 06/23/2022    Sig - Route: Inject 1 mg into the skin once a week. For 4 weeks - Subcutaneous   Sent to pharmacy as: Semaglutide, 1 MG/DOSE, 4 MG/3ML Solution Pen-injector   E-Prescribing Status: Receipt confirmed by pharmacy (06/23/2022  3:16 PM EDT)    Pharmacy  [facility] - [place], [place] - [address]   Additional Information

## 2022-07-17 LAB — HM DIABETES EYE EXAM

## 2022-07-20 ENCOUNTER — Other Ambulatory Visit: Payer: Self-pay

## 2022-07-20 MED ORDER — OZEMPIC (2 MG/DOSE) 8 MG/3ML ~~LOC~~ SOPN: 1.0000 | PEN_INJECTOR | SUBCUTANEOUS | 3 refills | Status: DC

## 2022-07-31 ENCOUNTER — Other Ambulatory Visit: Payer: Self-pay | Admitting: Family Medicine

## 2022-07-31 DIAGNOSIS — I10 Essential (primary) hypertension: Secondary | ICD-10-CM

## 2022-07-31 DIAGNOSIS — Z5181 Encounter for therapeutic drug level monitoring: Secondary | ICD-10-CM

## 2022-08-21 ENCOUNTER — Other Ambulatory Visit: Payer: Self-pay | Admitting: Family Medicine

## 2022-08-21 DIAGNOSIS — I480 Paroxysmal atrial fibrillation: Secondary | ICD-10-CM

## 2022-08-21 DIAGNOSIS — I10 Essential (primary) hypertension: Secondary | ICD-10-CM

## 2022-08-21 DIAGNOSIS — Z5181 Encounter for therapeutic drug level monitoring: Secondary | ICD-10-CM

## 2022-08-22 ENCOUNTER — Other Ambulatory Visit: Payer: Self-pay

## 2022-08-22 MED ORDER — REPATHA 140 MG/ML ~~LOC~~ SOSY: 140.0000 mg | PREFILLED_SYRINGE | SUBCUTANEOUS | 5 refills | Status: DC

## 2022-08-22 NOTE — Telephone Encounter (Signed)
Requested Prescriptions  Pending Prescriptions Disp Refills   carvedilol (COREG) 25 MG tablet [Pharmacy Med Name: carvediloL 25 mg tablet (COREG)] 90 tablet 0    Sig: Take 0.5 tablets (12.5 mg total) by mouth in the morning and 0.5 tablets (12.5 mg total) in the evening. Take with meals.     Cardiovascular: Beta Blockers 3 Passed - 08/21/2022  8:14 AM      Passed - Cr in normal range and within 360 days    Creat  Date Value Ref Range Status  11/23/2021 0.83 0.50 - 1.05 mg/dL Final   Creatinine,U  Date Value Ref Range Status  12/18/2013 109.3 mg/dL Final   Creatinine, Urine  Date Value Ref Range Status  02/24/2022 81 20 - 275 mg/dL Final         Passed - AST in normal range and within 360 days    AST  Date Value Ref Range Status  11/23/2021 18 10 - 35 U/L Final         Passed - ALT in normal range and within 360 days    ALT  Date Value Ref Range Status  11/23/2021 16 6 - 29 U/L Final         Passed - Last BP in normal range    BP Readings from Last 1 Encounters:  05/05/22 128/72         Passed - Last Heart Rate in normal range    Pulse Readings from Last 1 Encounters:  05/05/22 68         Passed - Valid encounter within last 6 months    Recent Outpatient Visits           3 months ago Acute bacterial rhinosinusitis   [facility] Danelle Berry, PA-C   5 months ago Essential hypertension   [facility] Danelle Berry, PA-C   7 months ago Upper respiratory tract infection due to COVID-19 virus   [facility] Danelle Berry, PA-C   7 months ago Upper respiratory tract infection due to COVID-19 virus   [facility] Danelle Berry, PA-C   7 months ago Upper respiratory tract infection, unspecified type   [facility] Danelle Berry, [place]

## 2022-08-25 ENCOUNTER — Ambulatory Visit: Payer: BC Managed Care – PPO | Admitting: Family Medicine

## 2022-08-29 ENCOUNTER — Encounter: Payer: Self-pay | Admitting: Family Medicine

## 2022-08-29 ENCOUNTER — Ambulatory Visit (INDEPENDENT_AMBULATORY_CARE_PROVIDER_SITE_OTHER): Payer: BC Managed Care – PPO | Admitting: Family Medicine

## 2022-08-29 VITALS — BP 134/76 | HR 68 | Temp 97.9°F | Resp 16 | Ht 64.0 in | Wt 235.3 lb

## 2022-08-29 DIAGNOSIS — R202 Paresthesia of skin: Secondary | ICD-10-CM

## 2022-08-29 DIAGNOSIS — Z7984 Long term (current) use of oral hypoglycemic drugs: Secondary | ICD-10-CM

## 2022-08-29 DIAGNOSIS — Z6841 Body Mass Index (BMI) 40.0 and over, adult: Secondary | ICD-10-CM

## 2022-08-29 DIAGNOSIS — I5030 Unspecified diastolic (congestive) heart failure: Secondary | ICD-10-CM

## 2022-08-29 DIAGNOSIS — G2581 Restless legs syndrome: Secondary | ICD-10-CM

## 2022-08-29 DIAGNOSIS — E039 Hypothyroidism, unspecified: Secondary | ICD-10-CM | POA: Diagnosis not present

## 2022-08-29 DIAGNOSIS — E119 Type 2 diabetes mellitus without complications: Secondary | ICD-10-CM

## 2022-08-29 DIAGNOSIS — I10 Essential (primary) hypertension: Secondary | ICD-10-CM

## 2022-08-29 DIAGNOSIS — Z5181 Encounter for therapeutic drug level monitoring: Secondary | ICD-10-CM

## 2022-08-29 DIAGNOSIS — E782 Mixed hyperlipidemia: Secondary | ICD-10-CM | POA: Diagnosis not present

## 2022-08-29 NOTE — Progress Notes (Signed)
Patient ID: Elizabeth Russo, female    DOB: [birth year], 62 y.o.   MRN: [id number]  PCP: Danelle Berry, PA-C  Chief Complaint  Patient presents with   Skin Problem    Burning sensation on legs and spread over to arms. Onset for a week    Subjective:   Elizabeth Russo is a 62 y.o. female, presents to clinic with CC of the following:  HPI  Here for tingling, pain, pins needles to LE and then left arm noted about a week ago, felt like gout pain, no rash, no swelling She wonders if ozempic caused it? She is not on supplements, no past deficiencies that she knows of, she is eating healthy, not avoiding meat Legs/feet are bothering her more at night   Wt Readings from Last 12 Encounters:  08/29/22 235 lb 4.8 oz (106.7 kg)  05/05/22 242 lb 3.2 oz (109.9 kg)  03/13/22 247 lb 6.4 oz (112.2 kg)  02/24/22 246 lb (111.6 kg)  01/05/22 246 lb (111.6 kg)  12/09/21 247 lb 6.4 oz (112.2 kg)  11/23/21 246 lb 8 oz (111.8 kg)  06/03/21 245 lb 6.4 oz (111.3 kg)  12/06/20 257 lb (116.6 kg)  11/01/20 254 lb 6.4 oz (115.4 kg)  04/09/20 253 lb (114.8 kg)  02/11/20 253 lb (114.8 kg)   BMI Readings from Last 5 Encounters:  08/29/22 40.39 kg/m  05/05/22 41.57 kg/m  03/13/22 42.47 kg/m  02/24/22 42.23 kg/m  01/05/22 42.23 kg/m      2 mg ozempic    Patient Active Problem List   Diagnosis Date Noted   S/P cholecystectomy 01/22/2020   Calculus of gallbladder with acute cholecystitis without obstruction    Statin intolerance 03/31/2019   Heart failure with preserved left ventricular function (HFpEF) (HCC) 03/31/2019   Atrial paroxysmal tachycardia 03/31/2019   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 with body mass index (BMI) of 45.0 to 49.9 in adult (HCC) 03/31/2019   Urinary incontinence 03/31/2019   Myalgia due to statin 04/13/2017   Venous stasis 04/13/2017   History of non-ST elevation myocardial infarction (NSTEMI) 02/05/2015   Hx of percutaneous transluminal  coronary angioplasty 02/05/2015   Gout of knee 12/25/2014   Chronic combined systolic and diastolic heart failure (HCC) 05/24/2014   Coronary atherosclerosis 12/19/2013   Hyperlipidemia 12/19/2013   Essential hypertension 12/18/2013   Diabetes mellitus type 2 in obese 12/18/2013   Hypothyroidism 12/18/2013   Dyslipidemia 02/13/2011   History of tobacco use 02/13/2011      Current Outpatient Medications:    albuterol (VENTOLIN HFA) 108 (90 Base) MCG/ACT inhaler, Inhale 2 puffs into the lungs every 6 (six) hours as needed for wheezing or shortness of breath., Disp: 8 g, Rfl: 0   amLODipine-valsartan (EXFORGE) 10-160 MG tablet, Take 1 tablet by mouth daily., Disp: 30 tablet, Rfl: 0   aspirin EC 81 MG tablet, Take 81 mg by mouth daily. , Disp: , Rfl:    carvedilol (COREG) 25 MG tablet, Take 0.5 tablets (12.5 mg total) by mouth in the morning and 0.5 tablets (12.5 mg total) in the evening. Take with meals., Disp: 90 tablet, Rfl: 0   diclofenac sodium (VOLTAREN) 1 % GEL, Apply 2 g topically daily as needed (pain)., Disp: , Rfl:    Evolocumab (REPATHA) 140 MG/ML SOSY, Inject 140 mg into the skin every 14 (fourteen) days., Disp: 2 mL, Rfl: 5   famotidine (PEPCID) 20 MG tablet, Take 1 tablet (20 mg total) by mouth Two (2)  times a day., Disp: 180 tablet, Rfl: 3   fluticasone (FLONASE) 50 MCG/ACT nasal spray, Place 2 sprays into both nostrils daily., Disp: 16 g, Rfl: 2   glucose blood test strip, Test tid, Disp: 100 each, Rfl: 12   Insulin Pen Needle 32G X 4 MM MISC, Use as directed, Disp: 100 each, Rfl: PRN   levocetirizine (XYZAL) 5 MG tablet, Take 1 tablet (5 mg total) by mouth every evening., Disp: 90 tablet, Rfl: 1   levothyroxine (SYNTHROID) 137 MCG tablet, Take 1 tablet (137 mcg total) by mouth before breakfast on Tuesdays, Thursdays, Saturdays, and Sundays., Disp: 48 tablet, Rfl: 0   levothyroxine (SYNTHROID) 150 MCG tablet, Take 1 tablet by mouth once daily on Monday, Wednesday and Friday  and take 137 mcg on the other days., Disp: 78 tablet, Rfl: 0   metFORMIN (GLUCOPHAGE-XR) 500 MG 24 hr tablet, Take 1 tablet (500 mg total) by mouth daily with breakfast., Disp: 90 tablet, Rfl: 3   Semaglutide, 2 MG/DOSE, (OZEMPIC, 2 MG/DOSE,) 8 MG/3ML SOPN, Inject 1 Pen into the skin once a week., Disp: 3 mL, Rfl: 3   Dulaglutide (TRULICITY) 1.5 MG/0.5ML SOPN, Inject 1.5 mg into the skin once a week. (Patient not taking: Reported on 05/05/2022), Disp: 2 mL, Rfl: 0   Allergies  Allergen Reactions   Atorvastatin Other (See Comments)    Other reaction(s): Muscle Pain   Hydralazine Other (See Comments)    Aggravated gout Aggravated gout   Hydrochlorothiazide Other (See Comments)    Aggravated gout   Lisinopril Cough   Pravastatin Other (See Comments)    Other reaction(s): Muscle Pain   Rosuvastatin Other (See Comments)    Other reaction(s): Muscle Pain     Social History   Tobacco Use   Smoking status: Former    Current packs/day: 0.00    Average packs/day: 0.5 packs/day for 35.2 years (17.6 ttl pk-yrs)    Types: Cigarettes    Start date: 01/09/1978    Quit date: 03/09/2013    Years since quitting: 9.4   Smokeless tobacco: Never  Vaping Use   Vaping status: Never Used  Substance Use Topics   Alcohol use: Yes    Alcohol/week: 0.0 standard drinks of alcohol    Comment: Socially    Drug use: No      Chart Review Today: I personally reviewed active problem list, medication list, allergies, family history, social history, health maintenance, notes from last encounter, lab results, imaging with the patient/caregiver today.   Review of Systems  Constitutional: Negative.   HENT: Negative.    Eyes: Negative.   Respiratory: Negative.    Cardiovascular: Negative.   Gastrointestinal: Negative.   Endocrine: Negative.   Genitourinary: Negative.   Musculoskeletal: Negative.   Skin: Negative.   Allergic/Immunologic: Negative.   Neurological: Negative.   Hematological: Negative.    Psychiatric/Behavioral: Negative.    All other systems reviewed and are negative.      Objective:   Vitals:   08/29/22 1521  BP: 134/76  Pulse: 68  Resp: 16  Temp: 97.9 F (36.6 C)  TempSrc: Oral  SpO2: 99%  Weight: 235 lb 4.8 oz (106.7 kg)  Height: 5\' 4"  (1.626 m)    Body mass index is 40.39 kg/m.  Physical Exam Vitals and nursing note reviewed.  Constitutional:      General: She is not in acute distress.    Appearance: Normal appearance. She is well-developed. She is obese. She is not ill-appearing, toxic-appearing or diaphoretic.  HENT:  Head: Normocephalic and atraumatic.     Nose: Nose normal.  Eyes:     General:        Right eye: No discharge.        Left eye: No discharge.     Conjunctiva/sclera: Conjunctivae normal.  Neck:     Trachea: No tracheal deviation.  Cardiovascular:     Rate and Rhythm: Normal rate and regular rhythm.  Pulmonary:     Effort: Pulmonary effort is normal. No respiratory distress.     Breath sounds: No stridor.  Musculoskeletal:        General: Normal range of motion.  Skin:    General: Skin is warm and dry.     Findings: No rash.  Neurological:     Mental Status: She is alert.     Motor: No abnormal muscle tone.     Coordination: Coordination normal.  Psychiatric:        Behavior: Behavior normal.      Results for orders placed or performed in visit on 07/18/22  HM DIABETES EYE EXAM  Result Value Ref Range   HM Diabetic Eye Exam No Retinopathy No Retinopathy       Assessment & Plan:     ICD-10-CM   1. Paresthesia of upper and lower extremities of both sides  R20.2 CBC with Differential/Platelet    COMPLETE METABOLIC PANEL WITH GFR    TSH    Iron, TIBC and Ferritin Panel    Vitamin B12   pins ans needles, pain/burning to LE and left UE, onset a week ago, no swelling or rash, check thyroid, electrolytes, r/o deficiencies    2. Heart failure with preserved left ventricular function (HFpEF) (HCC)  I50.30    per  cardiology    3. Hypothyroidism, unspecified type  E03.9 CBC with Differential/Platelet    TSH   she has lost some weight, on same dose thyroid meds, recheck TSH and adjust med doses as needed    4. Mixed hyperlipidemia  E78.2 COMPLETE METABOLIC PANEL WITH GFR    Lipid panel   repatha per cardiology    5. Essential hypertension  I10 COMPLETE METABOLIC PANEL WITH GFR   BP near goal    6. Class 3 severe obesity with serious comorbidity and body mass index (BMI) of 40.0 to 44.9 in adult, unspecified obesity type (HCC)  E66.01 VITAMIN D 25 Hydroxy (Vit-D Deficiency, Fractures)   Z68.41    shes lost 10 lbs in the past few months, 20 lbs in the past 2 years, on ozempic and working on diet/lifestyle    7. Type 2 diabetes mellitus not at goal [facility])  E11.9 COMPLETE METABOLIC PANEL WITH GFR    Hemoglobin A1c   last A1C was at goal - due for recheck of labs, cardiology changed GLP-1 from trulicity to ozempic for heart benefits/indications    8. Encounter for medication monitoring  Z51.81 CBC with Differential/Platelet    COMPLETE METABOLIC PANEL WITH GFR    Hemoglobin A1c    Lipid panel    TSH    VITAMIN D 25 Hydroxy (Vit-D Deficiency, Fractures)    Iron, TIBC and Ferritin Panel    Vitamin B12    9. Restless legs  G25.81    sx with some RLS features, check iron/ferritin/b12 replace any deficiencies, also can consider meds     F/up in 6 month routine needed Sooner for fup on labs/sx pending results and plan     Danelle Berry, PA-C 08/29/22 3:31 PM

## 2022-08-30 LAB — COMPLETE METABOLIC PANEL WITH GFR
AG Ratio: 1.2 (calc) (ref 1.0–2.5)
ALT: 16 U/L (ref 6–29)
AST: 16 U/L (ref 10–35)
Albumin: 4.1 g/dL (ref 3.6–5.1)
Alkaline phosphatase (APISO): 77 U/L (ref 37–153)
BUN: 8 mg/dL (ref 7–25)
CO2: 28 mmol/L (ref 20–32)
Calcium: 9.3 mg/dL (ref 8.6–10.4)
Chloride: 105 mmol/L (ref 98–110)
Creat: 0.78 mg/dL (ref 0.50–1.05)
Globulin: 3.4 g/dL (ref 1.9–3.7)
Glucose, Bld: 101 mg/dL — ABNORMAL HIGH (ref 65–99)
Potassium: 4 mmol/L (ref 3.5–5.3)
Sodium: 139 mmol/L (ref 135–146)
Total Bilirubin: 0.3 mg/dL (ref 0.2–1.2)
Total Protein: 7.5 g/dL (ref 6.1–8.1)
eGFR: 86 mL/min/{1.73_m2} (ref >=60)

## 2022-08-30 LAB — LIPID PANEL
Cholesterol: 127 mg/dL (ref <200)
HDL: 44 mg/dL — ABNORMAL LOW (ref >=50)
LDL Cholesterol (Calc): 60 mg/dL
Non-HDL Cholesterol (Calc): 83 mg/dL (ref <130)
Total CHOL/HDL Ratio: 2.9 (calc) (ref <5.0)
Triglycerides: 149 mg/dL (ref <150)

## 2022-08-30 LAB — IRON,TIBC AND FERRITIN PANEL
%SAT: 16 % (ref 16–45)
Ferritin: 121 ng/mL (ref 16–288)
Iron: 46 ug/dL (ref 45–160)
TIBC: 287 ug/dL (ref 250–450)

## 2022-08-30 LAB — CBC WITH DIFFERENTIAL/PLATELET
Absolute Monocytes: 519 {cells}/uL (ref 200–950)
Basophils Absolute: 58 {cells}/uL (ref 0–200)
Basophils Relative: 1.1 %
Eosinophils Absolute: 159 {cells}/uL (ref 15–500)
Eosinophils Relative: 3 %
HCT: 41.5 % (ref 35.0–45.0)
Hemoglobin: 13.8 g/dL (ref 11.7–15.5)
Lymphs Abs: 2703 {cells}/uL (ref 850–3900)
MCH: 29.1 pg (ref 27.0–33.0)
MCHC: 33.3 g/dL (ref 32.0–36.0)
MCV: 87.4 fL (ref 80.0–100.0)
MPV: 8.9 fL (ref 7.5–12.5)
Monocytes Relative: 9.8 %
Neutro Abs: 1860 cells/uL (ref 1500–7800)
Neutrophils Relative %: 35.1 %
Platelets: 227 10*3/uL (ref 140–400)
RBC: 4.75 10*6/uL (ref 3.80–5.10)
RDW: 13.1 % (ref 11.0–15.0)
Total Lymphocyte: 51 %
WBC: 5.3 10*3/uL (ref 3.8–10.8)

## 2022-08-30 LAB — HEMOGLOBIN A1C
Hgb A1c MFr Bld: 5.8 %{Hb} — ABNORMAL HIGH (ref <5.7)
Mean Plasma Glucose: 120 mg/dL
eAG (mmol/L): 6.6 mmol/L

## 2022-08-30 LAB — VITAMIN D 25 HYDROXY (VIT D DEFICIENCY, FRACTURES): Vit D, 25-Hydroxy: 27 ng/mL — ABNORMAL LOW (ref 30–100)

## 2022-08-30 LAB — TSH: TSH: 0.54 m[IU]/L (ref 0.40–4.50)

## 2022-08-30 LAB — VITAMIN B12: Vitamin B-12: 524 pg/mL (ref 200–1100)

## 2022-08-31 ENCOUNTER — Other Ambulatory Visit: Payer: Self-pay | Admitting: Family Medicine

## 2022-08-31 DIAGNOSIS — E039 Hypothyroidism, unspecified: Secondary | ICD-10-CM

## 2022-08-31 DIAGNOSIS — I480 Paroxysmal atrial fibrillation: Secondary | ICD-10-CM

## 2022-08-31 DIAGNOSIS — Z5181 Encounter for therapeutic drug level monitoring: Secondary | ICD-10-CM

## 2022-08-31 DIAGNOSIS — I10 Essential (primary) hypertension: Secondary | ICD-10-CM

## 2022-08-31 MED ORDER — LEVOTHYROXINE SODIUM 137 MCG PO TABS
137.0000 ug | ORAL_TABLET | Freq: Every day | ORAL | 1 refills | Status: DC
Start: 2022-08-31 — End: 2023-04-30

## 2022-08-31 MED ORDER — AMLODIPINE BESYLATE-VALSARTAN 10-160 MG PO TABS
1.0000 | ORAL_TABLET | Freq: Every day | ORAL | 1 refills | Status: DC
Start: 2022-08-31 — End: 2023-03-09

## 2022-08-31 MED ORDER — CARVEDILOL 25 MG PO TABS
ORAL_TABLET | ORAL | 1 refills | Status: DC
Start: 2022-08-31 — End: 2023-03-07

## 2022-09-15 ENCOUNTER — Encounter: Payer: Self-pay | Admitting: Cardiology

## 2022-09-15 ENCOUNTER — Other Ambulatory Visit (HOSPITAL_COMMUNITY): Payer: Self-pay

## 2022-09-15 ENCOUNTER — Telehealth: Payer: Self-pay

## 2022-09-15 NOTE — Telephone Encounter (Signed)
PA started, please see separate encounter, I routed you in.

## 2022-09-15 NOTE — Telephone Encounter (Signed)
Pharmacy Patient Advocate Encounter   Received notification from Physician's Office that prior authorization for [facility] is required/requested.   Insurance verification completed.   The patient is insured through [facility] .   Per test claim: PA required; PA submitted to [facility] via CoverMyMeds Key/confirmation #/EOC [id number] Status is pending

## 2022-09-15 NOTE — Telephone Encounter (Signed)
PLEASE ADVISE.

## 2022-09-15 NOTE — Telephone Encounter (Signed)
Per her most recent lab work it is not greater than 6.5 - she is evelated at 5.8 but not over 6.5

## 2022-09-15 NOTE — Telephone Encounter (Signed)
Pharmacy Patient Advocate Encounter  Received notification from [facility] that Prior Authorization for [facility] has been DENIED.  Full denial letter will be uploaded to the media tab. See denial reason below.  CRITERIA NOT MET PER PLAN

## 2022-09-18 NOTE — Telephone Encounter (Signed)
Patient calling with questions to why is it denied and what's next options. Please advise

## 2022-09-19 ENCOUNTER — Telehealth: Payer: Self-pay | Admitting: Cardiology

## 2022-09-19 NOTE — Telephone Encounter (Signed)
Full denial letter is uploaded on chart media. Patient does not meet criteria for approval. Labs did not meet approvable range per plan.

## 2022-09-19 NOTE — Telephone Encounter (Signed)
See previous encounter regarding Semaglutide, 2 MG/DOSE, (OZEMPIC, 2 MG/DOSE,) 8 MG/3ML SOPN.  Patient is following up. She states Ozempic is no longer covered by insurance and she is completely out of medication. She would like to discuss her next steps.

## 2022-09-19 NOTE — Telephone Encounter (Signed)
Called patient, she advised that Ozempic was denied- full denial letter in chart under media tab. Patient aware I will notify MD to provide further next steps since medication is not approved. She is aware we will contact her with updates.   Thanks!

## 2022-09-22 NOTE — Telephone Encounter (Signed)
Pt calling for an update

## 2022-09-25 ENCOUNTER — Other Ambulatory Visit: Payer: Self-pay

## 2022-09-25 MED ORDER — MOUNJARO 2.5 MG/0.5ML ~~LOC~~ SOAJ: 2.5000 mg | SUBCUTANEOUS | 0 refills | Status: DC

## 2022-09-26 ENCOUNTER — Telehealth: Payer: Self-pay | Admitting: Cardiology

## 2022-09-26 NOTE — Telephone Encounter (Signed)
Mounjaro sent in for patient.

## 2022-09-26 NOTE — Telephone Encounter (Signed)
Spoke to [employer] at [employer] and gave him the codes E11.69 and E66.9 as the requested codes

## 2022-09-26 NOTE — Telephone Encounter (Signed)
Elizabeth Russo is calling to get diagnosis codes for this pt. Please advise

## 2022-10-30 ENCOUNTER — Other Ambulatory Visit: Payer: Self-pay | Admitting: Family Medicine

## 2022-11-20 ENCOUNTER — Other Ambulatory Visit: Payer: Self-pay | Admitting: Family Medicine

## 2022-11-20 ENCOUNTER — Telehealth: Payer: Self-pay

## 2022-11-20 ENCOUNTER — Other Ambulatory Visit: Payer: Self-pay | Admitting: *Deleted

## 2022-11-20 DIAGNOSIS — Z5181 Encounter for therapeutic drug level monitoring: Secondary | ICD-10-CM

## 2022-11-20 DIAGNOSIS — E039 Hypothyroidism, unspecified: Secondary | ICD-10-CM

## 2022-11-20 DIAGNOSIS — K219 Gastro-esophageal reflux disease without esophagitis: Secondary | ICD-10-CM

## 2022-11-20 MED ORDER — OZEMPIC (2 MG/DOSE) 8 MG/3ML ~~LOC~~ SOPN: 2.0000 mg | PEN_INJECTOR | SUBCUTANEOUS | 0 refills | Status: DC

## 2022-11-20 NOTE — Telephone Encounter (Signed)
Pharmacy Patient Advocate Encounter   Received notification from Fax that prior authorization for REPATHA is required/requested.   Form completed and faxed to plan.

## 2022-11-20 NOTE — Telephone Encounter (Signed)
Pharmacy Patient Advocate Encounter  Received notification from [facility] that Prior Authorization for repatha has been APPROVED from 11/20/22 to 11/20/23   PA #/Case ID/Reference #: [id number] FH

## 2022-11-21 ENCOUNTER — Other Ambulatory Visit: Payer: Self-pay | Admitting: Family Medicine

## 2022-11-21 DIAGNOSIS — Z5181 Encounter for therapeutic drug level monitoring: Secondary | ICD-10-CM

## 2022-11-21 DIAGNOSIS — E039 Hypothyroidism, unspecified: Secondary | ICD-10-CM

## 2022-11-21 DIAGNOSIS — K219 Gastro-esophageal reflux disease without esophagitis: Secondary | ICD-10-CM

## 2022-11-23 ENCOUNTER — Other Ambulatory Visit: Payer: Self-pay | Admitting: Family Medicine

## 2022-11-23 DIAGNOSIS — K219 Gastro-esophageal reflux disease without esophagitis: Secondary | ICD-10-CM

## 2022-12-18 ENCOUNTER — Other Ambulatory Visit: Payer: Self-pay | Admitting: Family Medicine

## 2022-12-18 DIAGNOSIS — B372 Candidiasis of skin and nail: Secondary | ICD-10-CM

## 2022-12-20 ENCOUNTER — Telehealth: Payer: Self-pay | Admitting: Cardiology

## 2022-12-20 NOTE — Telephone Encounter (Signed)
Pt c/o medication issue:  1. Name of Medication:   Semaglutide, 2 MG/DOSE, (OZEMPIC, 2 MG/DOSE,) 8 MG/3ML SOPN    2. How are you currently taking this medication (dosage and times per day)? As written  3. Are you having a reaction (difficulty breathing--STAT)? no  4. What is your medication issue? Pharmacy told her we denied her script and she wants to know why

## 2022-12-21 MED ORDER — OZEMPIC (2 MG/DOSE) 8 MG/3ML ~~LOC~~ SOPN: 2.0000 mg | PEN_INJECTOR | SUBCUTANEOUS | 11 refills | Status: DC

## 2022-12-21 NOTE — Telephone Encounter (Signed)
Rx sent to pharmacy   

## 2023-02-05 ENCOUNTER — Telehealth: Payer: Self-pay | Admitting: Pharmacy Technician

## 2023-02-05 ENCOUNTER — Other Ambulatory Visit (HOSPITAL_COMMUNITY): Payer: Self-pay

## 2023-02-05 ENCOUNTER — Telehealth: Payer: Self-pay | Admitting: Cardiology

## 2023-02-05 NOTE — Telephone Encounter (Signed)
Pt called in stating her pharmacy needs something from the insurance company to get a refill on her ozempic. She may need PA, please advise.

## 2023-02-05 NOTE — Telephone Encounter (Signed)
Pharmacy Patient Advocate Encounter   Received notification from Pt Calls Messages that prior authorization for ozempic is required/requested.   Insurance verification completed.   The patient is insured through  [employer]  .   Per test claim: PA required; PA submitted to above mentioned insurance via CoverMyMeds Key/confirmation #/EOC [id number] Status is pending

## 2023-02-06 NOTE — Telephone Encounter (Signed)
Resent chart notes of plasma glucose levels

## 2023-02-06 NOTE — Telephone Encounter (Signed)
Pharmacy Patient Advocate Encounter  Received notification from  [employer]  that Prior Authorization for ozempic has been APPROVED from 02/06/23 to 02/05/2026   PA #/Case ID/Reference #: Sandrea Matte

## 2023-02-09 ENCOUNTER — Telehealth: Payer: Self-pay | Admitting: Cardiology

## 2023-02-09 MED ORDER — OZEMPIC (2 MG/DOSE) 8 MG/3ML ~~LOC~~ SOPN: 2.0000 mg | PEN_INJECTOR | SUBCUTANEOUS | 3 refills | Status: DC

## 2023-02-09 NOTE — Telephone Encounter (Signed)
*  STAT* If patient is at the pharmacy, call can be transferred to refill team.   1. Which medications need to be refilled? (please list name of each medication and dose if known)   Semaglutide, 2 MG/DOSE, (OZEMPIC, 2 MG/DOSE,) 8 MG/3ML SOPN    4. Which pharmacy/location (including street and city if local pharmacy) is medication to be sent to? [facility] [address] [place], [place] - [address] Phone: [phone]  Fax: [phone]       5. Do they need a 30 day or 90 day supply? 90

## 2023-02-09 NOTE — Telephone Encounter (Signed)
 Refill Request.

## 2023-03-07 ENCOUNTER — Telehealth: Payer: Self-pay

## 2023-03-07 ENCOUNTER — Other Ambulatory Visit: Payer: Self-pay | Admitting: Family Medicine

## 2023-03-07 DIAGNOSIS — I10 Essential (primary) hypertension: Secondary | ICD-10-CM

## 2023-03-07 DIAGNOSIS — I480 Paroxysmal atrial fibrillation: Secondary | ICD-10-CM

## 2023-03-07 DIAGNOSIS — E039 Hypothyroidism, unspecified: Secondary | ICD-10-CM

## 2023-03-07 DIAGNOSIS — Z5181 Encounter for therapeutic drug level monitoring: Secondary | ICD-10-CM

## 2023-03-07 NOTE — Telephone Encounter (Signed)
MB is full  

## 2023-03-07 NOTE — Telephone Encounter (Signed)
 Copied from CRM [id number]. Topic: Clinical - Medication Refill >> Mar 07, 2023  3:06 PM Philippa Chester F wrote: Most Recent Primary Care Visit:  Provider: Danelle Berry  Department: [facility]  Visit Type: OFFICE VISIT  Date: 08/29/2022  Medication: carvedilol (COREG) 25 MG tablet  levothyroxine (SYNTHROID) 137 MCG tablet Patient would like the prescription to read for Monday, Wednesday and Friday     Has the patient contacted their pharmacy? Yes (Agent: If no, request that the patient contact the pharmacy for the refill. If patient does not wish to contact the pharmacy document the reason why and proceed with request.) (Agent: If yes, when and what did the pharmacy advise?)  Is this the correct pharmacy for this prescription? Yes  This is the patient's preferred pharmacy:   [facility] [address] [place] - [address] [place] [address] Phone: [phone] Fax: [phone]   Has the prescription been filled recently? No  Is the patient out of the medication? Yes  Has the patient been seen for an appointment in the last year OR does the patient have an upcoming appointment? Yes  Can we respond through MyChart? Yes  Agent: Please be advised that Rx refills may take up to 3 business days. We ask that you follow-up with your pharmacy.

## 2023-03-07 NOTE — Telephone Encounter (Signed)
 Copied from CRM [id number]. Topic: Clinical - Medication Refill >> Mar 07, 2023  3:20 PM Philippa Chester F wrote: Most Recent Primary Care Visit:  Provider: Danelle Berry  Department: [facility]  Visit Type: OFFICE VISIT  Date: 03/07/2023  Medication: carvedilol (COREG) 25 MG tablet  levothyroxine (SYNTHROID) 137 MCG tablet ( Patient mentioned the prescription for this medication needs to read for Monday, Wednesday and Friday as she takes a similar named medication for the other days of the week.)     Has the patient contacted their pharmacy? Yes (Agent: If no, request that the patient contact the pharmacy for the refill. If patient does not wish to contact the pharmacy document the reason why and proceed with request.) (Agent: If yes, when and what did the pharmacy advise?)  Is this the correct pharmacy for this prescription? Yes  This is the patient's preferred pharmacy:    [facility] [address] [place] - [address] [place] [address] Phone: [phone] Fax: [phone]   Has the prescription been filled recently? No  Is the patient out of the medication? Yes  Has the patient been seen for an appointment in the last year OR does the patient have an upcoming appointment? Yes  Can we respond through MyChart? Yes  Agent: Please be advised that Rx refills may take up to 3 business days. We ask that you follow-up with your pharmacy.

## 2023-03-07 NOTE — Telephone Encounter (Signed)
 Needs appt for further refills.

## 2023-03-08 NOTE — Telephone Encounter (Signed)
 Spoke with pt and appt scheduled for tomorrow with Sheliah Mends

## 2023-03-09 ENCOUNTER — Ambulatory Visit: Payer: Self-pay | Admitting: Family Medicine

## 2023-03-09 ENCOUNTER — Encounter: Payer: Self-pay | Admitting: Family Medicine

## 2023-03-09 VITALS — BP 130/72 | HR 72 | Resp 16 | Ht 64.0 in | Wt 235.0 lb

## 2023-03-09 DIAGNOSIS — I10 Essential (primary) hypertension: Secondary | ICD-10-CM

## 2023-03-09 DIAGNOSIS — Z1211 Encounter for screening for malignant neoplasm of colon: Secondary | ICD-10-CM

## 2023-03-09 DIAGNOSIS — E1169 Type 2 diabetes mellitus with other specified complication: Secondary | ICD-10-CM

## 2023-03-09 DIAGNOSIS — E669 Obesity, unspecified: Secondary | ICD-10-CM

## 2023-03-09 DIAGNOSIS — Z760 Encounter for issue of repeat prescription: Secondary | ICD-10-CM

## 2023-03-09 DIAGNOSIS — I2510 Atherosclerotic heart disease of native coronary artery without angina pectoris: Secondary | ICD-10-CM

## 2023-03-09 DIAGNOSIS — K219 Gastro-esophageal reflux disease without esophagitis: Secondary | ICD-10-CM

## 2023-03-09 DIAGNOSIS — I5030 Unspecified diastolic (congestive) heart failure: Secondary | ICD-10-CM

## 2023-03-09 DIAGNOSIS — E039 Hypothyroidism, unspecified: Secondary | ICD-10-CM | POA: Diagnosis not present

## 2023-03-09 DIAGNOSIS — Z6841 Body Mass Index (BMI) 40.0 and over, adult: Secondary | ICD-10-CM

## 2023-03-09 DIAGNOSIS — R0981 Nasal congestion: Secondary | ICD-10-CM

## 2023-03-09 DIAGNOSIS — E785 Hyperlipidemia, unspecified: Secondary | ICD-10-CM

## 2023-03-09 DIAGNOSIS — G720 Drug-induced myopathy: Secondary | ICD-10-CM

## 2023-03-09 DIAGNOSIS — Z5181 Encounter for therapeutic drug level monitoring: Secondary | ICD-10-CM

## 2023-03-09 DIAGNOSIS — E66813 Obesity, class 3: Secondary | ICD-10-CM

## 2023-03-09 MED ORDER — FLUTICASONE PROPIONATE 50 MCG/ACT NA SUSP: 2.0000 | Freq: Every day | NASAL | 2 refills | Status: AC

## 2023-03-09 MED ORDER — FAMOTIDINE 20 MG PO TABS
20.0000 mg | ORAL_TABLET | Freq: Two times a day (BID) | ORAL | 1 refills | Status: DC | PRN
Start: 2023-03-09 — End: 2023-09-05

## 2023-03-09 MED ORDER — AMLODIPINE BESYLATE-VALSARTAN 10-160 MG PO TABS: 1.0000 | ORAL_TABLET | Freq: Every day | ORAL | 1 refills | Status: DC

## 2023-03-09 MED ORDER — CARVEDILOL 25 MG PO TABS
ORAL_TABLET | ORAL | 1 refills | Status: DC
Start: 2023-03-09 — End: 2023-04-23

## 2023-03-09 NOTE — Assessment & Plan Note (Signed)
 Managed by cardiology

## 2023-03-09 NOTE — Assessment & Plan Note (Signed)
 Statin myopathy, on repatha managed by cardiology

## 2023-03-09 NOTE — Assessment & Plan Note (Signed)
 Tried and failed multiple statins, did praluent and now on repatha

## 2023-03-09 NOTE — Assessment & Plan Note (Signed)
 HTN and CHF on amlodipine-valsartan and coreg BP at goal today on current meds/doses, meds refilled to new pharmacy BP Readings from Last 3 Encounters:  03/09/23 130/72  08/29/22 134/76  05/05/22 128/72

## 2023-03-09 NOTE — Assessment & Plan Note (Signed)
 About 6 months ago advised pt to reduce dose and do f/up TSH in 6-8 weeks She did not get or possibly understand these instructions so she continued taking the same meds and doses Today she endorses sx for both hyper and hypo thyroid - likely euthyroid Will recheck TSH and adjust med doses to keep labs in normal range Lab Results  Component Value Date   TSH 0.54 08/29/2022   TSH 2.77 11/23/2021   TSH 2.66 06/03/2021   TSH 2.27 04/09/2020   TSH 3.28 10/10/2019

## 2023-03-09 NOTE — Assessment & Plan Note (Signed)
 On ozempic 2 mg dose - with diabetes we could possibly try mounjaro Some weight loss over the past year Wt Readings from Last 5 Encounters:  03/09/23 235 lb (106.6 kg)  08/29/22 235 lb 4.8 oz (106.7 kg)  05/05/22 242 lb 3.2 oz (109.9 kg)  03/13/22 247 lb 6.4 oz (112.2 kg)  02/24/22 246 lb (111.6 kg)   BMI Readings from Last 5 Encounters:  03/09/23 40.34 kg/m  08/29/22 40.39 kg/m  05/05/22 41.57 kg/m  03/13/22 42.47 kg/m  02/24/22 42.23 kg/m   Discussed consult with specialist to help with adding more diet/lifestyle efforts into a structured weight loss and health plan, pt declined today

## 2023-03-09 NOTE — Assessment & Plan Note (Signed)
 Well-controlled type 2 diabetes meds changed over the past year to ozempic - she is on 2 mg dose Recent A1c was well below goal -  Lab Results  Component Value Date   HGBA1C 5.8 (H) 08/29/2022  DM foot exam done today UTD on eye exam she has statin intolerance but is on Repatha and valsartan for renal protection

## 2023-03-09 NOTE — Progress Notes (Signed)
 Name: Elizabeth Russo   MRN: [id number]    DOB: 1960/10/24   Date:03/09/2023       Progress Note  Chief Complaint  Patient presents with   Medication Refill    Has not stopped the Levothyroxine dose she was advised to stop. Has still been taking thyroid medications daily. Tues, Thurs, Sat, Sun and 150mg  Mon, Wed, and Fri.     Subjective:   Elizabeth Russo is a 63 y.o. female, presents to clinic for routine follow up on chronic conditions  Hypothyroidism: Current Medication Regimen: changed to 137 mcg synthroid, was on higher dose 150 and 137 alternating doses, but she did not understand the med changes and continued to take meds the sames until about a week ago she ran out and has takewn 137 for about the past 7-8 d or so Current Symptoms: constipation, losing hair, palpitations, and sweating Most recent results are below; we will be repeating labs today. Lab Results  Component Value Date   TSH 0.54 08/29/2022   TSH 2.77 11/23/2021   TSH 2.66 06/03/2021   TSH 2.27 04/09/2020   TSH 3.28 10/10/2019   DM obesity and CVD - cardiology got PA for ozempic and metformin Lab Results  Component Value Date   HGBA1C 5.8 (H) 08/29/2022   HGBA1C 5.7 (H) 11/23/2021   HGBA1C 5.7 (H) 06/03/2021   HGBA1C 5.8 (H) 11/01/2020   HGBA1C 6.0 (H) 04/09/2020    HLD on repatha Lab Results  Component Value Date   CHOL 127 08/29/2022   HDL 44 (L) 08/29/2022   LDLCALC 60 08/29/2022   TRIG 149 08/29/2022   CHOLHDL 2.9 08/29/2022   HTN and CHF on amlodipine-valsartan and coreg BP Readings from Last 3 Encounters:  03/09/23 130/72  08/29/22 134/76  05/05/22 128/72    Wt Readings from Last 5 Encounters:  03/09/23 235 lb (106.6 kg)  08/29/22 235 lb 4.8 oz (106.7 kg)  05/05/22 242 lb 3.2 oz (109.9 kg)  03/13/22 247 lb 6.4 oz (112.2 kg)  02/24/22 246 lb (111.6 kg)   BMI Readings from Last 5 Encounters:  03/09/23 40.34 kg/m  08/29/22 40.39 kg/m  05/05/22 41.57 kg/m   03/13/22 42.47 kg/m  02/24/22 42.23 kg/m        Current Outpatient Medications:    albuterol (VENTOLIN HFA) 108 (90 Base) MCG/ACT inhaler, Inhale 2 puffs into the lungs every 6 (six) hours as needed for wheezing or shortness of breath., Disp: 8 g, Rfl: 0   amLODipine-valsartan (EXFORGE) 10-160 MG tablet, Take 1 tablet by mouth daily., Disp: 90 tablet, Rfl: 1   aspirin EC 81 MG tablet, Take 81 mg by mouth daily. , Disp: , Rfl:    carvedilol (COREG) 25 MG tablet, TAKE 1/2 (ONE-HALF) TABLET BY MOUTH IN THE MORNING AND  1/2  TABLET  EVERY  EVENING  WITH  MEALS, Disp: 45 tablet, Rfl: 0   diclofenac sodium (VOLTAREN) 1 % GEL, Apply 2 g topically daily as needed (pain)., Disp: , Rfl:    Evolocumab (REPATHA) 140 MG/ML SOSY, Inject 140 mg into the skin every 14 (fourteen) days., Disp: 2 mL, Rfl: 5   famotidine (PEPCID) 20 MG tablet, Take 1 tablet (20 mg total) by mouth Two (2) times a day., Disp: 180 tablet, Rfl: 0   fluticasone (FLONASE) 50 MCG/ACT nasal spray, Place 2 sprays into both nostrils daily., Disp: 16 g, Rfl: 2   glucose blood test strip, Test tid, Disp: 100 each, Rfl: 12  Insulin Pen Needle 32G X 4 MM MISC, Use as directed, Disp: 100 each, Rfl: PRN   levocetirizine (XYZAL) 5 MG tablet, Take 1 tablet (5 mg total) by mouth every evening., Disp: 90 tablet, Rfl: 1   levothyroxine (SYNTHROID) 137 MCG tablet, Take 1 tablet (137 mcg total) by mouth daily before breakfast. Take 1 tablet (137 mcg total) by mouth before breakfast on Tuesdays, Thursdays, Saturdays, and Sundays., Disp: 90 tablet, Rfl: 1   metFORMIN (GLUCOPHAGE-XR) 500 MG 24 hr tablet, Take 1 tablet (500 mg total) by mouth daily with breakfast., Disp: 90 tablet, Rfl: 3   Semaglutide, 2 MG/DOSE, (OZEMPIC, 2 MG/DOSE,) 8 MG/3ML SOPN, Inject 2 mg into the skin once a week., Disp: 9 mL, Rfl: 3  Patient Active Problem List   Diagnosis Date Noted   Statin myopathy 03/09/2023   S/P cholecystectomy 01/22/2020   Calculus of gallbladder  with acute cholecystitis without obstruction    Statin intolerance 03/31/2019   Heart failure with preserved left ventricular function (HFpEF) (HCC) 03/31/2019   Atrial paroxysmal tachycardia (HCC) 03/31/2019   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 with body mass index (BMI) of 45.0 to 49.9 in adult [facility]) 03/31/2019   Urinary incontinence 03/31/2019   Myalgia due to statin 04/13/2017   Venous stasis 04/13/2017   History of non-ST elevation myocardial infarction (NSTEMI) 02/05/2015   Hx of percutaneous transluminal coronary angioplasty 02/05/2015   Gout of knee 12/25/2014   Chronic combined systolic and diastolic heart failure (HCC) 05/24/2014   Coronary atherosclerosis 12/19/2013   Hyperlipidemia 12/19/2013   Essential hypertension 12/18/2013   Type 2 diabetes mellitus with obesity (HCC) 12/18/2013   Hypothyroidism 12/18/2013   Dyslipidemia 02/13/2011   History of tobacco use 02/13/2011    Past Surgical History:  Procedure Laterality Date   ABDOMINAL HYSTERECTOMY     CARDIAC CATHETERIZATION     CHOLECYSTECTOMY     COLONOSCOPY WITH PROPOFOL N/A 08/13/2017   Procedure: COLONOSCOPY WITH PROPOFOL;  Surgeon: Toney Reil, MD;  Location: [facility] ENDOSCOPY;  Service: Gastroenterology;  Laterality: N/A;   heart stent  2012    Family History  Problem Relation Age of Onset   Heart disease Mother    Diabetes Mother    Hypertension Mother    Hyperlipidemia Mother    Thyroid disease Mother    Cancer Father        colon   Diabetes Father    Hypertension Father    Diabetes Sister    Hypertension Sister    Heart disease Sister    Hyperlipidemia Sister    Kidney disease Sister    Cancer Maternal Grandfather        lung   Heart attack Paternal Grandfather    Heart disease Sister    Diabetes Sister    Heart disease Sister    Arthritis Sister     Social History   Tobacco Use   Smoking status: Former    Current packs/day: 0.00    Average packs/day: 0.5  packs/day for 35.2 years (17.6 ttl pk-yrs)    Types: Cigarettes    Start date: 01/09/1978    Quit date: 03/09/2013    Years since quitting: 10.0   Smokeless tobacco: Never  Vaping Use   Vaping status: Never Used  Substance Use Topics   Alcohol use: Yes    Alcohol/week: 0.0 standard drinks of alcohol    Comment: Socially    Drug use: No     Allergies  Allergen Reactions   Atorvastatin  Other (See Comments)    Other reaction(s): Muscle Pain   Hydralazine Other (See Comments)    Aggravated gout Aggravated gout   Hydrochlorothiazide Other (See Comments)    Aggravated gout   Lisinopril Cough   Pravastatin Other (See Comments)    Other reaction(s): Muscle Pain   Rosuvastatin Other (See Comments)    Other reaction(s): Muscle Pain    Health Maintenance  Topic Date Due   Colonoscopy  08/14/2022   COVID-19 Vaccine (5 - 2024-25 season) 09/10/2022   FOOT EXAM  11/24/2022   Diabetic kidney evaluation - Urine ACR  02/25/2023   HEMOGLOBIN A1C  03/01/2023   INFLUENZA VACCINE  04/09/2023 (Originally 08/10/2022)   MAMMOGRAM  03/08/2024 (Originally 11/08/2022)   OPHTHALMOLOGY EXAM  07/17/2023   Diabetic kidney evaluation - eGFR measurement  08/29/2023   DTaP/Tdap/Td (2 - Td or Tdap) 04/10/2030   Pneumococcal Vaccine 54-12 Years old  Completed   Hepatitis C Screening  Completed   HIV Screening  Completed   Zoster Vaccines- Shingrix  Completed   HPV VACCINES  Aged Out    Chart Review Today: I personally reviewed active problem list, medication list, allergies, family history, social history, health maintenance, notes from last encounter, lab results, imaging with the patient/caregiver today.   Review of Systems  Constitutional: Negative.   HENT: Negative.    Eyes: Negative.   Respiratory: Negative.    Cardiovascular: Negative.   Gastrointestinal: Negative.   Endocrine: Negative.   Genitourinary: Negative.   Musculoskeletal: Negative.   Skin: Negative.   Allergic/Immunologic:  Negative.   Neurological: Negative.   Hematological: Negative.   Psychiatric/Behavioral: Negative.    All other systems reviewed and are negative.    Objective:   Vitals:   03/09/23 1426  BP: 130/72  Pulse: 72  Resp: 16  SpO2: 97%  Weight: 235 lb (106.6 kg)  Height: 5\' 4"  (1.626 m)    Body mass index is 40.34 kg/m.  Physical Exam Vitals and nursing note reviewed.  Constitutional:      General: She is not in acute distress.    Appearance: Normal appearance. She is well-developed. She is obese. She is not ill-appearing, toxic-appearing or diaphoretic.  HENT:     Head: Normocephalic and atraumatic.     Nose: Nose normal.  Eyes:     General:        Right eye: No discharge.        Left eye: No discharge.     Conjunctiva/sclera: Conjunctivae normal.  Neck:     Trachea: No tracheal deviation.  Cardiovascular:     Rate and Rhythm: Normal rate and regular rhythm.     Pulses: Normal pulses.     Heart sounds: Normal heart sounds. No murmur heard.    No friction rub. No gallop.  Pulmonary:     Effort: Pulmonary effort is normal. No respiratory distress.     Breath sounds: No stridor.  Skin:    General: Skin is warm and dry.     Findings: No rash.  Neurological:     Mental Status: She is alert.     Motor: No abnormal muscle tone.     Coordination: Coordination normal.  Psychiatric:        Mood and Affect: Mood normal.        Behavior: Behavior normal.     Diabetic Foot Exam - Simple   Simple Foot Form Diabetic Foot exam was performed with the following findings: Yes 03/09/2023  2:50 PM  Visual  Inspection No deformities, no ulcerations, no other skin breakdown bilaterally: Yes Sensation Testing Intact to touch and monofilament testing bilaterally: Yes Pulse Check Posterior Tibialis and Dorsalis pulse intact bilaterally: Yes Comments     Functional Status Survey:   Results for orders placed or performed in visit on 08/29/22  CBC with Differential/Platelet    Collection Time: 08/29/22  3:41 PM  Result Value Ref Range   WBC 5.3 3.8 - 10.8 Thousand/uL   RBC 4.75 3.80 - 5.10 Million/uL   Hemoglobin 13.8 11.7 - 15.5 g/dL   HCT 16.1 09.6 - 04.5 %   MCV 87.4 80.0 - 100.0 fL   MCH 29.1 27.0 - 33.0 pg   MCHC 33.3 32.0 - 36.0 g/dL   RDW 40.9 81.1 - 91.4 %   Platelets 227 140 - 400 Thousand/uL   MPV 8.9 7.5 - 12.5 fL   Neutro Abs 1,860 1,500 - 7,800 cells/uL   Lymphs Abs 2,703 850 - 3,900 cells/uL   Absolute Monocytes 519 200 - 950 cells/uL   Eosinophils Absolute 159 15 - 500 cells/uL   Basophils Absolute 58 0 - 200 cells/uL   Neutrophils Relative % 35.1 %   Total Lymphocyte 51.0 %   Monocytes Relative 9.8 %   Eosinophils Relative 3.0 %   Basophils Relative 1.1 %  COMPLETE METABOLIC PANEL WITH GFR   Collection Time: 08/29/22  3:41 PM  Result Value Ref Range   Glucose, Bld 101 (H) 65 - 99 mg/dL   BUN 8 7 - 25 mg/dL   Creat 7.82 9.56 - 2.13 mg/dL   eGFR 86 > OR = 60 YQ/MVH/8.46N6   BUN/Creatinine Ratio SEE NOTE: 6 - 22 (calc)   Sodium 139 135 - 146 mmol/L   Potassium 4.0 3.5 - 5.3 mmol/L   Chloride 105 98 - 110 mmol/L   CO2 28 20 - 32 mmol/L   Calcium 9.3 8.6 - 10.4 mg/dL   Total Protein 7.5 6.1 - 8.1 g/dL   Albumin 4.1 3.6 - 5.1 g/dL   Globulin 3.4 1.9 - 3.7 g/dL (calc)   AG Ratio 1.2 1.0 - 2.5 (calc)   Total Bilirubin 0.3 0.2 - 1.2 mg/dL   Alkaline phosphatase (APISO) 77 37 - 153 U/L   AST 16 10 - 35 U/L   ALT 16 6 - 29 U/L  Hemoglobin A1c   Collection Time: 08/29/22  3:41 PM  Result Value Ref Range   Hgb A1c MFr Bld 5.8 (H) <5.7 % of total Hgb   Mean Plasma Glucose 120 mg/dL   eAG (mmol/L) 6.6 mmol/L  Lipid panel   Collection Time: 08/29/22  3:41 PM  Result Value Ref Range   Cholesterol 127 <200 mg/dL   HDL 44 (L) > OR = 50 mg/dL   Triglycerides 295 <284 mg/dL   LDL Cholesterol (Calc) 60 mg/dL (calc)   Total CHOL/HDL Ratio 2.9 <5.0 (calc)   Non-HDL Cholesterol (Calc) 83 <132 mg/dL (calc)  TSH   Collection Time: 08/29/22   3:41 PM  Result Value Ref Range   TSH 0.54 0.40 - 4.50 mIU/L  VITAMIN D 25 Hydroxy (Vit-D Deficiency, Fractures)   Collection Time: 08/29/22  3:41 PM  Result Value Ref Range   Vit D, 25-Hydroxy 27 (L) 30 - 100 ng/mL  Iron, TIBC and Ferritin Panel   Collection Time: 08/29/22  3:41 PM  Result Value Ref Range   Iron 46 45 - 160 mcg/dL   TIBC 440 102 - 725 mcg/dL (calc)   %SAT 16  16 - 45 % (calc)   Ferritin 121 16 - 288 ng/mL  Vitamin B12   Collection Time: 08/29/22  3:41 PM  Result Value Ref Range   Vitamin B-12 524 200 - 1,100 pg/mL      Assessment & Plan:   Type 2 diabetes mellitus with obesity (HCC) Assessment & Plan: Well-controlled type 2 diabetes meds changed over the past year to ozempic - she is on 2 mg dose Recent A1c was well below goal -  Lab Results  Component Value Date   HGBA1C 5.8 (H) 08/29/2022  DM foot exam done today UTD on eye exam she has statin intolerance but is on Repatha and valsartan for renal protection   Orders: -     Hemoglobin A1c -     Microalbumin / creatinine urine ratio -     HM Diabetes Foot Exam -     COMPLETE METABOLIC PANEL WITH GFR  Hypothyroidism, unspecified type Assessment & Plan: About 6 months ago advised pt to reduce dose and do f/up TSH in 6-8 weeks She did not get or possibly understand these instructions so she continued taking the same meds and doses Today she endorses sx for both hyper and hypo thyroid - likely euthyroid Will recheck TSH and adjust med doses to keep labs in normal range Lab Results  Component Value Date   TSH 0.54 08/29/2022   TSH 2.77 11/23/2021   TSH 2.66 06/03/2021   TSH 2.27 04/09/2020   TSH 3.28 10/10/2019    Orders: -     TSH  Essential hypertension Assessment & Plan: HTN and CHF on amlodipine-valsartan and coreg BP at goal today on current meds/doses, meds refilled to new pharmacy BP Readings from Last 3 Encounters:  03/09/23 130/72  08/29/22 134/76  05/05/22 128/72     Orders: -     COMPLETE METABOLIC PANEL WITH GFR -     Carvedilol; TAKE 1/2 (ONE-HALF) TABLET BY MOUTH IN THE MORNING AND  1/2  TABLET  EVERY  EVENING  WITH  MEALS  Dispense: 90 tablet; Refill: 1 -     amLODIPine Besylate-Valsartan; Take 1 tablet by mouth daily.  Dispense: 90 tablet; Refill: 1  Dyslipidemia Assessment & Plan: Statin myopathy, on repatha managed by cardiology  Orders: -     COMPLETE METABOLIC PANEL WITH GFR  Atherosclerosis of native coronary artery of native heart without angina pectoris -     COMPLETE METABOLIC PANEL WITH GFR  Heart failure with preserved left ventricular function (HFpEF) (HCC) Assessment & Plan: Managed by cardiology   Statin myopathy Assessment & Plan: Tried and failed multiple statins, did praluent and now on repatha   Encounter for medication monitoring -     amLODIPine Besylate-Valsartan; Take 1 tablet by mouth daily.  Dispense: 90 tablet; Refill: 1  Gastroesophageal reflux disease, unspecified whether esophagitis present -     Famotidine; Take 1 tablet (20 mg total) by mouth 2 (two) times daily as needed for heartburn or indigestion.  Dispense: 180 tablet; Refill: 1  Nasal congestion -     Fluticasone Propionate; Place 2 sprays into both nostrils daily.  Dispense: 16 g; Refill: 2  Medication refill -     Fluticasone Propionate; Place 2 sprays into both nostrils daily.  Dispense: 16 g; Refill: 2  Screening for malignant neoplasm of colon -     Ambulatory referral to Gastroenterology  Class 3 severe obesity with serious comorbidity and body mass index (BMI) of 40.0 to 44.9 in adult, unspecified  obesity type [facility]) Assessment & Plan: On ozempic 2 mg dose - with diabetes we could possibly try mounjaro Some weight loss over the past year Wt Readings from Last 5 Encounters:  03/09/23 235 lb (106.6 kg)  08/29/22 235 lb 4.8 oz (106.7 kg)  05/05/22 242 lb 3.2 oz (109.9 kg)  03/13/22 247 lb 6.4 oz (112.2 kg)  02/24/22 246 lb (111.6  kg)   BMI Readings from Last 5 Encounters:  03/09/23 40.34 kg/m  08/29/22 40.39 kg/m  05/05/22 41.57 kg/m  03/13/22 42.47 kg/m  02/24/22 42.23 kg/m   Discussed consult with specialist to help with adding more diet/lifestyle efforts into a structured weight loss and health plan, pt declined today      Return for Thyroid labs in ~8 weeks and 6 month routine f/up appt.   Danelle Berry, PA-C 03/09/23 2:40 PM

## 2023-03-10 LAB — COMPLETE METABOLIC PANEL WITH GFR
AG Ratio: 1.4 (calc) (ref 1.0–2.5)
ALT: 16 U/L (ref 6–29)
AST: 17 U/L (ref 10–35)
Albumin: 4.2 g/dL (ref 3.6–5.1)
Alkaline phosphatase (APISO): 77 U/L (ref 37–153)
BUN: 10 mg/dL (ref 7–25)
CO2: 29 mmol/L (ref 20–32)
Calcium: 9.3 mg/dL (ref 8.6–10.4)
Chloride: 105 mmol/L (ref 98–110)
Creat: 0.85 mg/dL (ref 0.50–1.05)
Globulin: 3.1 g/dL (ref 1.9–3.7)
Glucose, Bld: 97 mg/dL (ref 65–99)
Potassium: 4.2 mmol/L (ref 3.5–5.3)
Sodium: 140 mmol/L (ref 135–146)
Total Bilirubin: 0.4 mg/dL (ref 0.2–1.2)
Total Protein: 7.3 g/dL (ref 6.1–8.1)
eGFR: 77 mL/min/{1.73_m2} (ref >=60)

## 2023-03-10 LAB — MICROALBUMIN / CREATININE URINE RATIO
Creatinine, Urine: 77 mg/dL (ref 20–275)
Microalb Creat Ratio: 3 mg/g{creat} (ref <30)
Microalb, Ur: 0.2 mg/dL

## 2023-03-10 LAB — HEMOGLOBIN A1C
Hgb A1c MFr Bld: 5.7 %{Hb} — ABNORMAL HIGH (ref <5.7)
Mean Plasma Glucose: 117 mg/dL
eAG (mmol/L): 6.5 mmol/L

## 2023-03-10 LAB — TSH: TSH: 0.53 m[IU]/L (ref 0.40–4.50)

## 2023-03-12 ENCOUNTER — Ambulatory Visit: Payer: Self-pay | Admitting: Family Medicine

## 2023-03-12 NOTE — Telephone Encounter (Signed)
 Chief Complaint: Back pain, lower right side Symptoms: back pain/pulling sensation on right side Frequency: since Saturday Pertinent Negatives: Patient denies fever, weakness/numbness/tingling in extremities, abdominal pain, pain with urination Disposition: [] ED /[] Urgent Care (no appt availability in office) / [] Appointment(In office/virtual)/ []  [facility]/ [x] Home Care/ [] Refused Recommended Disposition /[]  Mobile Bus/ []  Follow-up with PCP Additional Notes: Patient called in stating she has been experiencing back pain on her right lower back since Saturday. Patient thinks it may be contributed to raking and yard work she did on Thursday through Saturday. Patient states she also feels a pulling feeling down to her right leg, and she notices the discomfort when she's standing for a long period of time, or getting out of bed. Educated patient on home advise for muscle strain, and educated patient to call back if symptoms worsen or she develops weakness/numbness/tingling, loss of bowel/bladder.    Reason for Disposition  Caused by overuse from recent vigorous activity (e.g., exercise, gardening, lifting and carrying, sports)  Answer Assessment - Initial Assessment Questions 1. ONSET: "When did the pain begin?"      Saturday  2. LOCATION: "Where does it hurt?" (upper, mid or lower back)     Lower right side of back pain 3. SEVERITY: "How bad is the pain?"  (e.g., Scale 1-10; mild, moderate, or severe)   - MILD (1-3): Doesn't interfere with normal activities.    - MODERATE (4-7): Interferes with normal activities or awakens from sleep.    - SEVERE (8-10): Excruciating pain, unable to do any normal activities.      8 4. PATTERN: "Is the pain constant?" (e.g., yes, no; constant, intermittent)      Comes and goes, feels worse when standing for extended lengths of time 5. RADIATION: "Does the pain shoot into your legs or somewhere else?"     Patient states she feels  tightness and pulling feeling from lower right back to right leg 6. CAUSE:  "What do you think is causing the back pain?"      Possible overexertion 7. BACK OVERUSE:  "Any recent lifting of heavy objects, strenuous work or exercise?"     Patient states she was moving brush on Thursday, Saturday she was raking and back pain started  8. MEDICINES: "What have you taken so far for the pain?" (e.g., nothing, acetaminophen, NSAIDS)     Patient took ibuprofen  9. NEUROLOGIC SYMPTOMS: "Do you have any weakness, numbness, or problems with bowel/bladder control?"     No 10. OTHER SYMPTOMS: "Do you have any other symptoms?" (e.g., fever, abdomen pain, burning with urination, blood in urine)       No  Protocols used: Back Pain-A-AH

## 2023-03-14 ENCOUNTER — Encounter: Payer: Self-pay | Admitting: Family Medicine

## 2023-03-21 ENCOUNTER — Encounter: Payer: Self-pay | Admitting: *Deleted

## 2023-03-22 ENCOUNTER — Telehealth: Payer: Self-pay

## 2023-03-22 ENCOUNTER — Other Ambulatory Visit: Payer: Self-pay

## 2023-03-22 DIAGNOSIS — Z860100 Personal history of colon polyps, unspecified: Secondary | ICD-10-CM

## 2023-03-22 MED ORDER — NA SULFATE-K SULFATE-MG SULF 17.5-3.13-1.6 GM/177ML PO SOLN: 1.0000 | Freq: Once | ORAL | 0 refills | Status: AC

## 2023-03-22 NOTE — Telephone Encounter (Signed)
 Gastroenterology Pre-Procedure Review  Request Date: 05/16/23 Requesting Physician: Dr. Allegra Lai  PATIENT REVIEW QUESTIONS: The patient responded to the following health history questions as indicated:    1. Are you having any GI issues? yes (bloating, indigestion, constipation) 2. Do you have a personal history of Polyps? yes (last colonoscopy performed by Dr. Allegra Lai 08/13/17 recommended repeat in 5 years) 3. Do you have a family history of Colon Cancer or Polyps? yes (dad colon polyps) 4. Diabetes Mellitus? yes (has been advised to stop metformin 2 days prior to colonoscopy and stop semaglutide injections 7 days prior to colonoscopy) 5. Joint replacements in the past 12 months?no 6. Major health problems in the past 3 months?no 7. Any artificial heart valves, MVP, or defibrillator?no 8. Cardiac history? Yes cardiac clearance sent to heart care    MEDICATIONS & ALLERGIES:    Patient reports the following regarding taking any anticoagulation/antiplatelet therapy:   Plavix, Coumadin, Eliquis, Xarelto, Lovenox, Pradaxa, Brilinta, or Effient? no Aspirin? yes (81 mg daily)  Patient confirms/reports the following medications:  Current Outpatient Medications  Medication Sig Dispense Refill   albuterol (VENTOLIN HFA) 108 (90 Base) MCG/ACT inhaler Inhale 2 puffs into the lungs every 6 (six) hours as needed for wheezing or shortness of breath. 8 g 0   amLODipine-valsartan (EXFORGE) 10-160 MG tablet Take 1 tablet by mouth daily. 90 tablet 1   aspirin EC 81 MG tablet Take 81 mg by mouth daily.      carvedilol (COREG) 25 MG tablet TAKE 1/2 (ONE-HALF) TABLET BY MOUTH IN THE MORNING AND  1/2  TABLET  EVERY  EVENING  WITH  MEALS 90 tablet 1   Evolocumab (REPATHA) 140 MG/ML SOSY Inject 140 mg into the skin every 14 (fourteen) days. 2 mL 5   famotidine (PEPCID) 20 MG tablet Take 1 tablet (20 mg total) by mouth 2 (two) times daily as needed for heartburn or indigestion. 180 tablet 1   fluticasone (FLONASE)  50 MCG/ACT nasal spray Place 2 sprays into both nostrils daily. 16 g 2   levothyroxine (SYNTHROID) 137 MCG tablet Take 1 tablet (137 mcg total) by mouth daily before breakfast. Take 1 tablet (137 mcg total) by mouth before breakfast on Tuesdays, Thursdays, Saturdays, and Sundays. 90 tablet 1   metFORMIN (GLUCOPHAGE-XR) 500 MG 24 hr tablet Take 1 tablet (500 mg total) by mouth daily with breakfast. 90 tablet 3   Semaglutide, 2 MG/DOSE, (OZEMPIC, 2 MG/DOSE,) 8 MG/3ML SOPN Inject 2 mg into the skin once a week. 9 mL 3   diclofenac sodium (VOLTAREN) 1 % GEL Apply 2 g topically daily as needed (pain). (Patient not taking: Reported on 03/22/2023)     No current facility-administered medications for this visit.    Patient confirms/reports the following allergies:  Allergies  Allergen Reactions   Atorvastatin Other (See Comments)    Other reaction(s): Muscle Pain   Hydralazine Other (See Comments)    Aggravated gout Aggravated gout   Hydrochlorothiazide Other (See Comments)    Aggravated gout   Lisinopril Cough   Pravastatin Other (See Comments)    Other reaction(s): Muscle Pain   Rosuvastatin Other (See Comments)    Other reaction(s): Muscle Pain    No orders of the defined types were placed in this encounter.   AUTHORIZATION INFORMATION Primary Insurance: 1D#: Group #:  Secondary Insurance: 1D#: Group #:  SCHEDULE INFORMATION: Date: 05/16/23 Time: Location: [facility]

## 2023-03-23 ENCOUNTER — Telehealth: Payer: Self-pay

## 2023-03-23 NOTE — Telephone Encounter (Signed)
   Pre-operative Risk Assessment    Patient Name: Elizabeth Russo  DOB: 1960-05-27 MRN: [id number]   Date of last office visit: 03/13/22 DR Debbe Odea Date of next office visit: NONE   Request for Surgical Clearance    Procedure:   COLONOSCOPY  Date of Surgery:  Clearance 05/17/23                                Surgeon:  DR Lannette Donath Surgeon's Group or Practice Name:  [facility] Phone number:  [phone] Fax number:  [phone] MICHELLE   Type of Clearance Requested:   - Medical  - Pharmacy:  Hold METFORMIN AND SEMAGLUTIDE      Type of Anesthesia:  General    Additional requests/questions:    SignedMarlow Baars   03/23/2023, 3:46 PM

## 2023-03-23 NOTE — Telephone Encounter (Signed)
   Name: Elizabeth Russo  DOB: [birth year]  MRN: [id number]  Primary Cardiologist: Debbe Odea, MD  Chart reviewed as part of pre-operative protocol coverage. Because of Elizabeth Russo past medical history and time since last visit, she will require a follow-up in-office visit in order to better assess preoperative cardiovascular risk.  Pre-op covering staff: - Please schedule appointment and call patient to inform them. If patient already had an upcoming appointment within acceptable timeframe, please add "pre-op clearance" to the appointment notes so provider is aware. - Please contact requesting surgeon's office via preferred method (i.e, phone, fax) to inform them of need for appointment prior to surgery.  Joylene Grapes, NP  03/23/2023, 4:01 PM

## 2023-03-23 NOTE — Telephone Encounter (Signed)
 Pt has been scheduled in office preop appt with Charlsie Quest, NP.

## 2023-03-28 NOTE — Telephone Encounter (Signed)
 Preop Visit scheduled with cardiology on 04/23/23.  Follow up on clearance after this date.  Thanks, [place], [place]

## 2023-04-20 ENCOUNTER — Other Ambulatory Visit: Payer: Self-pay | Admitting: Pharmacist Clinician (PhC)/ Clinical Pharmacy Specialist

## 2023-04-20 MED ORDER — REPATHA 140 MG/ML ~~LOC~~ SOSY: 140.0000 mg | PREFILLED_SYRINGE | SUBCUTANEOUS | 5 refills | Status: DC

## 2023-04-23 ENCOUNTER — Ambulatory Visit: Attending: Cardiology | Admitting: Cardiology

## 2023-04-23 ENCOUNTER — Encounter: Payer: Self-pay | Admitting: Cardiology

## 2023-04-23 VITALS — BP 122/72 | HR 58 | Ht 64.0 in | Wt 233.4 lb

## 2023-04-23 DIAGNOSIS — I2510 Atherosclerotic heart disease of native coronary artery without angina pectoris: Secondary | ICD-10-CM

## 2023-04-23 DIAGNOSIS — I4719 Other supraventricular tachycardia: Secondary | ICD-10-CM

## 2023-04-23 DIAGNOSIS — Z01810 Encounter for preprocedural cardiovascular examination: Secondary | ICD-10-CM | POA: Diagnosis not present

## 2023-04-23 DIAGNOSIS — I10 Essential (primary) hypertension: Secondary | ICD-10-CM | POA: Diagnosis not present

## 2023-04-23 DIAGNOSIS — E782 Mixed hyperlipidemia: Secondary | ICD-10-CM

## 2023-04-23 MED ORDER — CARVEDILOL 25 MG PO TABS: ORAL_TABLET | ORAL | 1 refills | Status: DC

## 2023-04-23 NOTE — Patient Instructions (Signed)
 Medication Instructions:  NO CHANGES   Lab Work: NONE   Testing/Procedures: NONE  Follow-Up: At [facility], you and your health needs are our priority.  As part of our continuing mission to provide you with exceptional heart care, our providers are all part of one team.  This team includes your primary Cardiologist (physician) and Advanced Practice Providers or APPs (Physician Assistants and Nurse Practitioners) who all work together to provide you with the care you need, when you need it.  Your next appointment:   1 year(s)   Provider:   You may see Constancia Delton, MD or one of the following Advanced Practice Providers on your designated Care Team:   Laneta Pintos, NP Gildardo Labrador, PA-C Varney Gentleman, PA-C Cadence [place], PA-C Ronald Cockayne, NP Morey Ar, NP

## 2023-04-23 NOTE — Progress Notes (Signed)
 Cardiology Office Note:  .   Date:  04/23/2023  ID:  Elizabeth Russo, DOB October 10, 1960, MRN [id number] PCP: Danelle Berry, PA-C  [facility] Providers Cardiologist:  Debbe Odea, MD    History of Present Illness: .   Elizabeth Russo is a 63 y.o. female with a past medical history of atrial tachycardia, hypertension, coronary artery disease with NSTEMI and PCI/DES to OM 2, mid RCA (01/2011), diabetes, who presents today needing an preoperative cardiovascular examination prior to upcoming colonoscopy.   Previously followed with [facility] diagnosed with NSTEMI in 2013.  Left heart catheterization was performed which showed an occluded mid RCA, 90% stenosis of the small OM1 branch, 50% proximal RCA, distal LAD, large OM 2.  PCI was performed to the OM 2 branch with a DES.  2018 she had stress echocardiogram exercising for 6 minutes achieving 10.1 METS with no wall motion abnormalities.  Last echocardiogram in 2013 revealed an LVEF of 45% with mild concentric LVH.  She also had noted history of myalgias on statin and was currently taking ezetimibe and Praluent for hyperlipidemia.  Echocardiogram repeated in 12/2018 showed preserved ejection fraction with an EF of 60 to 65% and impaired relaxation.  She did not tolerate verapamil in the past and was started on carvedilol.  Lexiscan Myoview in 2021 showed no ischemia.   She was last seen in clinic 3//24 by Dr.Agbor-Etang.  At that time she denied any chest pain or shortness of breath.  She had symptoms of palpitations and overall improved with carvedilol.  Bongiorno was not approved by insurance.  Did she continue to try to lose weight.  Prescription was sent for Ozempic.  There were no further testing that was needed.  She returns to clinic today  ROS: 10 point review of system has been reviewed and considered negative except ones been listed in the HPI  Studies Reviewed: Marland Kitchen   EKG Interpretation Date/Time:  Monday April 23 2023 09:04:38  EDT Ventricular Rate:  58 PR Interval:  210 QRS Duration:  94 QT Interval:  378 QTC Calculation: 371 R Axis:   -18  Text Interpretation: Sinus bradycardia with 1st degree A-V block Minimal voltage criteria for LVH, may be normal variant ( R in aVL ) Cannot rule out Anterior infarct , age undetermined T wave inversion in lateral leads When compared with ECG of 14-May-2015 22:03, Confirmed by Charlsie Quest ([id number]) on 04/23/2023 9:10:23 AM    Lexiscan MPI 02/21/2019 Pharmacological myocardial perfusion imaging study with no significant  ischemia Normal wall motion, EF estimated at 60% No EKG changes concerning for ischemia at peak stress or in recovery. Resting EKG with nonspecific T wave abnormality to the anterior precordial leads CT attenuation correction images with mild aortic atherosclerosis, moderate two-vessel coronary calcification LAD and left circumflex, mild calcification ostial/proximal RCA Low risk scan   2D echo 12/17/2018 1. Left ventricular ejection fraction, by visual estimation, is 60 to  65%. The left ventricle has normal function. There is mildly increased  left ventricular hypertrophy.   2. Left ventricular diastolic parameters are consistent with Grade I  diastolic dysfunction (impaired relaxation).   3. Mildly dilated left ventricular internal cavity size.   4. The left ventricle has no regional wall motion abnormalities.   5. Global right ventricle has normal systolic function.The right  ventricular size is normal. No increase in right ventricular wall  thickness.   6. Left atrial size was normal.   7. Right atrial size was normal.  8. Trivial pericardial effusion is present.   9. The mitral valve is normal in structure. Trace mitral valve  regurgitation.  10. The tricuspid valve is normal in structure. Tricuspid valve  regurgitation is mild.  11. The aortic valve was not well visualized. Aortic valve regurgitation  is not visualized. No evidence of aortic  valve sclerosis or stenosis.  12. The pulmonic valve was grossly normal. Pulmonic valve regurgitation is  trivial.  13. Normal pulmonary artery systolic pressure.  14. The inferior vena cava is normal in size with <50% respiratory  variability, suggesting right atrial pressure of 8 mmHg.  15. The interatrial septum was not well visualized.   Risk Assessment/Calculations:             Physical Exam:   VS:  BP 122/72   Pulse (!) 58   Ht 5\' 4"  (1.626 m)   Wt 233 lb 6.4 oz (105.9 kg)   LMP  (LMP Unknown)   SpO2 98%   BMI 40.06 kg/m    Wt Readings from Last 3 Encounters:  04/23/23 233 lb 6.4 oz (105.9 kg)  03/09/23 235 lb (106.6 kg)  08/29/22 235 lb 4.8 oz (106.7 kg)    GEN: Well nourished, well developed in no acute distress NECK: No JVD; No carotid bruits CARDIAC: RRR, no murmurs, rubs, gallops RESPIRATORY:  Clear to auscultation without rales, wheezing or rhonchi  ABDOMEN: Soft, non-tender, non-distended EXTREMITIES:  No edema; No deformity   ASSESSMENT AND PLAN: .   Preoperative cardiovascular examination without symptoms of decompensation.  Recommend holding semaglutide 1 week prior to procedure.  Recommend reaching out to PCP for recommendations of holding metformin.    [name]'s perioperative risk of a major cardiac event is 0.4% according to the Revised Cardiac Risk Index (RCRI).  Therefore, she is at low risk for perioperative complications.   Her functional capacity is good at 5.62 METs according to the Duke Activity Status Index (DASI). Recommendations: According to ACC/AHA guidelines, no further cardiovascular testing needed.  The patient may proceed to surgery at acceptable risk.    Atrial tachycardia/palpitations with reduced palpitations since being on beta-blocker therapy.  EKG today reveals sinus bradycardia with a rate of 58 with a for screening for block which is unchanged from prior studies, LVH, T wave inversions in lateral leads which are unchanged.  She  has been continued on carvedilol 12.5 mg twice daily.  Primary hypertension with blood pressure today 122/72 this is well-controlled.  She is continued on carvedilol 12.5 mg twice daily and amlodipine valsartan 10/160 mg daily.  She has been encouraged to continue to monitor pressures 1 to 2 hours postmedication administration as well.  Coronary artery disease with PCI/DES to OM1 in 2013.  Denies any anginal or anginal equivalents.  She has been continued on aspirin 81 mg daily, and Repatha 140 mg injection every 2 weeks.  EKG with no significant change found.  No further ischemic testing needed at this time.  Mixed hyperlipidemia associated type 2 diabetes.  Last LDL of 60.  Continued on Repatha.  Last A1c 5.7 she has been continued on metformin and Ozempic.  Ongoing management per PCP.  Morbid obesity with a BMI of 40.06.  She is continued on her weight loss journey with increased her activity.  She has also been continued on Ozempic.       Dispo: Patient return to clinic to see MD/APP in 11 to 12 months or sooner if needed  Signed, Kentravious Lipford, NP

## 2023-04-25 NOTE — Telephone Encounter (Signed)
 ASSESSMENT AND PLAN: .   Preoperative cardiovascular examination without symptoms of decompensation.  Recommend holding semaglutide 1 week prior to procedure.  Recommend reaching out to PCP for recommendations of holding metformin.    Elizabeth Russo's perioperative risk of a major cardiac event is 0.4% according to the Revised Cardiac Risk Index (RCRI).  Therefore, she is at low risk for perioperative complications.   Her functional capacity is good at 5.62 METs according to the Duke Activity Status Index (DASI). Recommendations: According to ACC/AHA guidelines, no further cardiovascular testing needed.  The patient may proceed to surgery at acceptable risk.

## 2023-04-26 ENCOUNTER — Other Ambulatory Visit: Payer: Self-pay

## 2023-04-26 MED ORDER — REPATHA 140 MG/ML ~~LOC~~ SOSY: 140.0000 mg | PREFILLED_SYRINGE | SUBCUTANEOUS | 3 refills | Status: DC

## 2023-04-30 ENCOUNTER — Other Ambulatory Visit: Payer: Self-pay

## 2023-04-30 ENCOUNTER — Ambulatory Visit: Payer: Self-pay

## 2023-04-30 ENCOUNTER — Telehealth: Payer: Self-pay

## 2023-04-30 DIAGNOSIS — E039 Hypothyroidism, unspecified: Secondary | ICD-10-CM

## 2023-04-30 DIAGNOSIS — Z5181 Encounter for therapeutic drug level monitoring: Secondary | ICD-10-CM

## 2023-04-30 MED ORDER — LEVOTHYROXINE SODIUM 137 MCG PO TABS: 137.0000 ug | ORAL_TABLET | Freq: Every day | ORAL | 1 refills | Status: DC

## 2023-04-30 MED ORDER — LEVOTHYROXINE SODIUM 137 MCG PO TABS
ORAL_TABLET | ORAL | 1 refills | Status: DC
Start: 2023-04-30 — End: 2023-07-06

## 2023-04-30 NOTE — Telephone Encounter (Signed)
 Refill request on   levothyroxine  (SYNTHROID ) 137 MCG tablet

## 2023-04-30 NOTE — Telephone Encounter (Signed)
Left pharmacy vm °

## 2023-04-30 NOTE — Telephone Encounter (Signed)
 Copied from CRM [id number]. Topic: Clinical - Medication Question >> Apr 30, 2023 10:24 AM Everette C wrote: Reason for CRM: Reymundo Caulk with [facility] would like clarity regarding the patients prescription for levothyroxine  (SYNTHROID ) 137 MCG tablet [[id number]]  Please contact further when possible

## 2023-05-16 ENCOUNTER — Encounter: Payer: Self-pay | Admitting: Gastroenterology

## 2023-05-16 ENCOUNTER — Encounter
Admission: RE | Disposition: A | Discharge status: Home / Self Care | Payer: Self-pay | Source: Home / Self Care | Attending: Gastroenterology

## 2023-05-16 ENCOUNTER — Ambulatory Visit: Admitting: Registered Nurse

## 2023-05-16 ENCOUNTER — Ambulatory Visit
Admission: RE | Admit: 2023-05-16 | Discharge: 2023-05-16 | Disposition: A | Discharge status: Home / Self Care | Attending: Gastroenterology | Admitting: Gastroenterology

## 2023-05-16 DIAGNOSIS — I2510 Atherosclerotic heart disease of native coronary artery without angina pectoris: Secondary | ICD-10-CM | POA: Insufficient documentation

## 2023-05-16 DIAGNOSIS — I252 Old myocardial infarction: Secondary | ICD-10-CM | POA: Insufficient documentation

## 2023-05-16 DIAGNOSIS — Z955 Presence of coronary angioplasty implant and graft: Secondary | ICD-10-CM | POA: Diagnosis not present

## 2023-05-16 DIAGNOSIS — K5730 Diverticulosis of large intestine without perforation or abscess without bleeding: Secondary | ICD-10-CM | POA: Insufficient documentation

## 2023-05-16 DIAGNOSIS — E039 Hypothyroidism, unspecified: Secondary | ICD-10-CM | POA: Insufficient documentation

## 2023-05-16 DIAGNOSIS — I5032 Chronic diastolic (congestive) heart failure: Secondary | ICD-10-CM | POA: Insufficient documentation

## 2023-05-16 DIAGNOSIS — Z1211 Encounter for screening for malignant neoplasm of colon: Secondary | ICD-10-CM | POA: Insufficient documentation

## 2023-05-16 DIAGNOSIS — E119 Type 2 diabetes mellitus without complications: Secondary | ICD-10-CM | POA: Diagnosis not present

## 2023-05-16 DIAGNOSIS — I110 Hypertensive heart disease with heart failure: Secondary | ICD-10-CM | POA: Insufficient documentation

## 2023-05-16 DIAGNOSIS — Z87891 Personal history of nicotine dependence: Secondary | ICD-10-CM | POA: Diagnosis not present

## 2023-05-16 DIAGNOSIS — G709 Myoneural disorder, unspecified: Secondary | ICD-10-CM | POA: Insufficient documentation

## 2023-05-16 DIAGNOSIS — Z860101 Personal history of adenomatous and serrated colon polyps: Secondary | ICD-10-CM | POA: Diagnosis present

## 2023-05-16 DIAGNOSIS — Z7985 Long-term (current) use of injectable non-insulin antidiabetic drugs: Secondary | ICD-10-CM | POA: Diagnosis not present

## 2023-05-16 DIAGNOSIS — D128 Benign neoplasm of rectum: Secondary | ICD-10-CM | POA: Insufficient documentation

## 2023-05-16 DIAGNOSIS — K621 Rectal polyp: Secondary | ICD-10-CM

## 2023-05-16 DIAGNOSIS — Z860100 Personal history of colon polyps, unspecified: Secondary | ICD-10-CM

## 2023-05-16 HISTORY — PX: COLONOSCOPY: SHX5424

## 2023-05-16 HISTORY — PX: POLYPECTOMY: SHX149

## 2023-05-16 HISTORY — DX: Other reaction to spinal and lumbar puncture: G97.1

## 2023-05-16 LAB — GLUCOSE, CAPILLARY: Glucose-Capillary: 88 mg/dL (ref 70–99)

## 2023-05-16 SURGERY — COLONOSCOPY
Anesthesia: General

## 2023-05-16 MED ORDER — PROPOFOL 500 MG/50ML IV EMUL
INTRAVENOUS | Status: DC | PRN
  Administered 2023-05-16: 140 ug/kg/min via INTRAVENOUS

## 2023-05-16 MED ORDER — PROPOFOL 10 MG/ML IV BOLUS
INTRAVENOUS | Status: DC | PRN
  Administered 2023-05-16: 70 mg via INTRAVENOUS

## 2023-05-16 MED ORDER — LIDOCAINE HCL (CARDIAC) PF 100 MG/5ML IV SOSY
PREFILLED_SYRINGE | INTRAVENOUS | Status: DC | PRN
  Administered 2023-05-16: 60 mg via INTRAVENOUS

## 2023-05-16 MED ORDER — SODIUM CHLORIDE 0.9 % IV SOLN: INTRAVENOUS | Status: DC

## 2023-05-16 NOTE — Anesthesia Postprocedure Evaluation (Signed)
 Anesthesia Post Note  Patient: Elizabeth Russo  Procedure(s) Performed: COLONOSCOPY POLYPECTOMY, INTESTINE  Patient location during evaluation: Endoscopy Anesthesia Type: General Level of consciousness: awake and alert Pain management: pain level controlled Vital Signs Assessment: post-procedure vital signs reviewed and stable Respiratory status: spontaneous breathing, nonlabored ventilation, respiratory function stable and patient connected to nasal cannula oxygen Cardiovascular status: blood pressure returned to baseline and stable Postop Assessment: no apparent nausea or vomiting Anesthetic complications: no   No notable events documented.   Last Vitals:  Vitals:   05/16/23 1114 05/16/23 1125  BP: (!) 89/58 112/65  Pulse: (!) 57 (!) 52  Resp: 14 11  Temp:    SpO2: 100% 100%    Last Pain:  Vitals:   05/16/23 1125  TempSrc:   PainSc: 0-No pain                 Nancey Awkward

## 2023-05-16 NOTE — Transfer of Care (Signed)
 Immediate Anesthesia Transfer of Care Note  Patient: Elizabeth Russo  Procedure(s) Performed: COLONOSCOPY POLYPECTOMY, INTESTINE  Patient Location: PACU  Anesthesia Type:General  Level of Consciousness: awake, alert , and oriented  Airway & Oxygen Therapy: Patient Spontanous Breathing and Patient connected to nasal cannula oxygen  Post-op Assessment: Report given to RN and Post -op Vital signs reviewed and stable  Post vital signs: Reviewed and stable  Last Vitals:  Vitals Value Taken Time  BP 110/58 05/16/23 1104  Temp 35.6 C 05/16/23 1104  Pulse 59 05/16/23 1104  Resp 21 05/16/23 1104  SpO2 99 % 05/16/23 1104    Last Pain:  Vitals:   05/16/23 1104  TempSrc: Temporal  PainSc: Asleep         Complications: No notable events documented.

## 2023-05-16 NOTE — H&P (Signed)
 Karma Oz, MD [address]  [address]  [place], [place] [address]  Main: [phone]  Fax: [phone] Pager: [phone]  Primary Care Physician:  Adeline Hone, PA-C Primary Gastroenterologist:  Dr. Karma Oz  Pre-Procedure History & Physical: HPI:  Elizabeth Russo is a 63 y.o. female is here for an colonoscopy.   Past Medical History:  Diagnosis Date   (HFpEF) heart failure with preserved ejection fraction (HCC)    Atrial fibrillation (HCC) 02/13/2011   Breast mass, right 04/25/2014   CAD (coronary artery disease)    Diabetes mellitus without complication (HCC)    Diverticulosis    Family history of colon cancer in father    Goiter 03/19/2014   Hearing loss 08/25/2011   HLD (hyperlipidemia)    Hypertension    Hypothyroid    Localized enlarged lymph nodes 10/23/2014   cervical    Low back pain 12/25/2014   Morbid obesity (HCC) 06/26/2014   Myalgia due to statin 04/13/2017   Intolerant to three different statins   NSTEMI (non-ST elevated myocardial infarction) (HCC) 01/2011   Subendocardial   Personal history of colonic polyps    Spinal headache    Vertigo     Past Surgical History:  Procedure Laterality Date   ABDOMINAL HYSTERECTOMY     CARDIAC CATHETERIZATION     CHOLECYSTECTOMY     COLONOSCOPY WITH PROPOFOL  N/A 08/13/2017   Procedure: COLONOSCOPY WITH PROPOFOL ;  Surgeon: Selena Daily, MD;  Location: [facility] ENDOSCOPY;  Service: Gastroenterology;  Laterality: N/A;   heart stent  2012    Prior to Admission medications   Medication Sig Start Date End Date Taking? Authorizing Provider  albuterol  (VENTOLIN  HFA) 108 (90 Base) MCG/ACT inhaler Inhale 2 puffs into the lungs every 6 (six) hours as needed for wheezing or shortness of breath. 05/05/22   Tapia, Leisa, PA-C  amLODipine -valsartan  (EXFORGE ) 10-160 MG tablet Take 1 tablet by mouth daily. 03/09/23   Tapia, Leisa, PA-C  aspirin EC 81 MG tablet Take 81 mg by mouth daily.     [provider]  carvedilol  (COREG ) 25 MG tablet TAKE 1/2 (ONE-HALF) TABLET BY MOUTH IN THE MORNING AND  1/2  TABLET  EVERY  EVENING  WITH  MEALS 04/23/23   Ronald Cockayne, NP  diclofenac sodium (VOLTAREN) 1 % GEL Apply 2 g topically daily as needed (pain). 06/09/15   [provider]  Evolocumab  (REPATHA ) 140 MG/ML SOSY Inject 140 mg into the skin every 14 (fourteen) days. 04/26/23   Constancia Delton, MD  famotidine  (PEPCID ) 20 MG tablet Take 1 tablet (20 mg total) by mouth 2 (two) times daily as needed for heartburn or indigestion. 03/09/23   Tapia, Leisa, PA-C  fluticasone  (FLONASE ) 50 MCG/ACT nasal spray Place 2 sprays into both nostrils daily. 03/09/23   Tapia, Leisa, PA-C  levothyroxine  (SYNTHROID ) 137 MCG tablet Take 1 tablet (137 mcg total) by mouth before breakfast on Tuesdays, Thursdays, Saturdays, and Sundays. 04/30/23   Tapia, Leisa, PA-C  metFORMIN  (GLUCOPHAGE -XR) 500 MG 24 hr tablet Take 1 tablet (500 mg total) by mouth daily with breakfast. 10/30/22   Tapia, Leisa, PA-C  Semaglutide , 2 MG/DOSE, (OZEMPIC , 2 MG/DOSE,) 8 MG/3ML SOPN Inject 2 mg into the skin once a week. 02/09/23   Constancia Delton, MD    Allergies as of 03/22/2023 - Review Complete 03/22/2023  Allergen Reaction Noted   Atorvastatin  Other (See Comments) 03/31/2011   Hydralazine Other (See Comments) 02/02/2015   Hydrochlorothiazide Other (See Comments) 01/25/2016  Lisinopril Cough 01/25/2016   Pravastatin Other (See Comments) 02/02/2015   Rosuvastatin Other (See Comments) 02/02/2015    Family History  Problem Relation Age of Onset   Heart disease Mother    Diabetes Mother    Hypertension Mother    Hyperlipidemia Mother    Thyroid  disease Mother    Cancer Father        colon   Diabetes Father    Hypertension Father    Diabetes Sister    Hypertension Sister    Heart disease Sister    Hyperlipidemia Sister    Kidney disease Sister    Cancer Maternal Grandfather        lung   Heart attack Paternal  Grandfather    Heart disease Sister    Diabetes Sister    Heart disease Sister    Arthritis Sister     Social History   Socioeconomic History   Marital status: Widowed    Spouse name: Not on file   Number of children: 2   Years of education: Not on file   Highest education level: High school graduate  Occupational History   Not on file  Tobacco Use   Smoking status: Former    Current packs/day: 0.00    Average packs/day: 0.5 packs/day for 35.2 years (17.6 ttl pk-yrs)    Types: Cigarettes    Start date: 01/09/1978    Quit date: 03/09/2013    Years since quitting: 10.1   Smokeless tobacco: Never  Vaping Use   Vaping status: Never Used  Substance and Sexual Activity   Alcohol use: Yes    Alcohol/week: 0.0 standard drinks of alcohol    Comment: Socially    Drug use: No   Sexual activity: Yes    Birth control/protection: Post-menopausal  Other Topics Concern   Not on file  Social History Narrative   Not on file   Social Drivers of Health   Financial Resource Strain: Low Risk  (11/01/2020)   Overall Financial Resource Strain (CARDIA)    Difficulty of Paying Living Expenses: Not hard at all  Food Insecurity: No Food Insecurity (11/01/2020)   Hunger Vital Sign    Worried About Running Out of Food in the Last Year: Never true    Ran Out of Food in the Last Year: Never true  Transportation Needs: No Transportation Needs (11/01/2020)   PRAPARE - Administrator, Civil Service (Medical): No    Lack of Transportation (Non-Medical): No  Physical Activity: Insufficiently Active (11/01/2020)   Exercise Vital Sign    Days of Exercise per Week: 2 days    Minutes of Exercise per Session: 20 min  Stress: No Stress Concern Present (11/01/2020)   [employer] of Occupational Health - Occupational Stress Questionnaire    Feeling of Stress : Only a little  Social Connections: Moderately Isolated (11/01/2020)   Social Connection and Isolation Panel [NHANES]     Frequency of Communication with Friends and Family: More than three times a week    Frequency of Social Gatherings with Friends and Family: Once a week    Attends Religious Services: 1 to 4 times per year    Active Member of [employer] or Organizations: No    Attends Banker Meetings: Never    Marital Status: Widowed  Intimate Partner Violence: Not At Risk (11/01/2020)   Humiliation, Afraid, Rape, and Kick questionnaire    Fear of Current or Ex-Partner: No    Emotionally Abused: No  Physically Abused: No    Sexually Abused: No    Review of Systems: See HPI, otherwise negative ROS  Physical Exam: BP 112/65   Pulse (!) 52   Temp (!) 96 F (35.6 C) (Temporal)   Resp 11   Ht 5\' 4"  (1.626 m)   Wt 102.5 kg   LMP  (LMP Unknown)   SpO2 100%   BMI 38.79 kg/m  General:   Alert,  pleasant and cooperative in NAD Head:  Normocephalic and atraumatic. Neck:  Supple; no masses or thyromegaly. Lungs:  Clear throughout to auscultation.    Heart:  Regular rate and rhythm. Abdomen:  Soft, nontender and nondistended. Normal bowel sounds, without guarding, and without rebound.   Neurologic:  Alert and  oriented x4;  grossly normal neurologically.  Impression/Plan: Elizabeth Russo is here for an colonoscopy to be performed for h/o colon adenomas  Risks, benefits, limitations, and alternatives regarding  colonoscopy have been reviewed with the patient.  Questions have been answered.  All parties agreeable.   Ellis Guys, MD  05/16/2023, 12:19 PM

## 2023-05-16 NOTE — Op Note (Signed)
 [facility] Gastroenterology Patient Name: Elizabeth Russo Procedure Date: 05/16/2023 10:26 AM MRN: [id number] Account #: [id number] Date of Birth: 01-May-1960 Admit Type: Outpatient Age: 63 Room: [facility] ENDO ROOM 4 Gender: Female Note Status: Finalized Instrument Name: Charlyn Cooley [id number] Procedure:             Colonoscopy Indications:           Surveillance: Personal history of adenomatous polyps                         on last colonoscopy > 5 years ago, Last colonoscopy:                         August 2019 Providers:             Selena Daily MD, MD Referring MD:          Adeline Hone (Referring MD) Medicines:             General Anesthesia Complications:         No immediate complications. Estimated blood loss: None. Procedure:             Pre-Anesthesia Assessment:                        - Prior to the procedure, a History and Physical was                         performed, and patient medications and allergies were                         reviewed. The patient is competent. The risks and                         benefits of the procedure and the sedation options and                         risks were discussed with the patient. All questions                         were answered and informed consent was obtained.                         Patient identification and proposed procedure were                         verified by the physician, the nurse, the                         anesthesiologist, the anesthetist and the technician                         in the pre-procedure area in the procedure room in the                         endoscopy suite. Mental Status Examination: alert and                         oriented. Airway Examination: normal oropharyngeal  airway and neck mobility. Respiratory Examination:                         clear to auscultation. CV Examination: normal.                         Prophylactic Antibiotics: The patient  does not require                         prophylactic antibiotics. Prior Anticoagulants: The                         patient has taken no anticoagulant or antiplatelet                         agents. ASA Grade Assessment: II - A patient with mild                         systemic disease. After reviewing the risks and                         benefits, the patient was deemed in satisfactory                         condition to undergo the procedure. The anesthesia                         plan was to use general anesthesia. Immediately prior                         to administration of medications, the patient was                         re-assessed for adequacy to receive sedatives. The                         heart rate, respiratory rate, oxygen saturations,                         blood pressure, adequacy of pulmonary ventilation, and                         response to care were monitored throughout the                         procedure. The physical status of the patient was                         re-assessed after the procedure.                        After obtaining informed consent, the colonoscope was                         passed under direct vision. Throughout the procedure,                         the patient's blood pressure, pulse, and oxygen  saturations were monitored continuously. The                         Colonoscope was introduced through the anus and                         advanced to the the cecum, identified by appendiceal                         orifice and ileocecal valve. The colonoscopy was                         performed without difficulty. The patient tolerated                         the procedure well. The quality of the bowel                         preparation was evaluated using the BBPS [facility] Bowel                         Preparation Scale) with scores of: Right Colon = 3,                         Transverse Colon = 3 and Left  Colon = 3 (entire mucosa                         seen well with no residual staining, small fragments                         of stool or opaque liquid). The total BBPS score                         equals 9. The ileocecal valve, appendiceal orifice,                         and rectum were photographed. Findings:      The perianal and digital rectal examinations were normal. Pertinent       negatives include normal sphincter tone and no palpable rectal lesions.      A 3 mm polyp was found in the rectum. The polyp was sessile. The polyp       was removed with a cold snare. Resection and retrieval were complete.      Multiple medium-mouthed diverticula were found in the entire colon.      The retroflexed view of the distal rectum and anal verge was normal and       showed no anal or rectal abnormalities. Impression:            - One 3 mm polyp in the rectum, removed with a cold                         snare. Resected and retrieved.                        - Diverticulosis in the entire examined colon.                        -  The distal rectum and anal verge are normal on                         retroflexion view. Recommendation:        - Discharge patient to home (with escort).                        - Resume previous diet today.                        - Continue present medications.                        - Await pathology results.                        - Repeat colonoscopy in 5 years for surveillance. Procedure Code(s):     --- Professional ---                        [phone], Colonoscopy, flexible; with removal of                         tumor(s), polyp(s), or other lesion(s) by snare                         technique Diagnosis Code(s):     --- Professional ---                        Z86.010, Personal history of colonic polyps                        D12.8, Benign neoplasm of rectum                        K57.30, Diverticulosis of large intestine without                          perforation or abscess without bleeding CPT copyright 2022 American Medical Association. All rights reserved. The codes documented in this report are preliminary and upon coder review may  be revised to meet current compliance requirements. Dr. Evia Hof Selena Daily MD, MD 05/16/2023 11:05:44 AM This report has been signed electronically. Number of Addenda: 0 Note Initiated On: 05/16/2023 10:26 AM Scope Withdrawal Time: 0 hours 7 minutes 55 seconds  Total Procedure Duration: 0 hours 13 minutes 44 seconds  Estimated Blood Loss:  Estimated blood loss: none.      [facility]

## 2023-05-16 NOTE — Anesthesia Preprocedure Evaluation (Addendum)
 Anesthesia Evaluation  Patient identified by MRN, date of birth, ID band Patient awake    Reviewed: Allergy & Precautions, NPO status , Patient's Chart, lab work & pertinent test results  History of Anesthesia Complications (+) POST - OP SPINAL HEADACHE and history of anesthetic complications  Airway Mallampati: III  TM Distance: >3 FB Neck ROM: full    Dental  (+) Upper Dentures, Partial Lower   Pulmonary former smoker   Pulmonary exam normal        Cardiovascular hypertension, On Medications + CAD, + Past MI and + Cardiac Stents  Normal cardiovascular exam     Neuro/Psych  Headaches  Neuromuscular disease  negative psych ROS   GI/Hepatic negative GI ROS, Neg liver ROS,,,  Endo/Other  diabetes, Type 2Hypothyroidism    Renal/GU negative Renal ROS  negative genitourinary   Musculoskeletal   Abdominal   Peds  Hematology negative hematology ROS (+)   Anesthesia Other Findings Past Medical History: No date: (HFpEF) heart failure with preserved ejection fraction (HCC) 02/13/2011: Atrial fibrillation (HCC) 04/25/2014: Breast mass, right No date: CAD (coronary artery disease) No date: Diabetes mellitus without complication (HCC) No date: Diverticulosis No date: Family history of colon cancer in father 03/19/2014: Goiter 08/25/2011: Hearing loss No date: HLD (hyperlipidemia) No date: Hypertension No date: Hypothyroid 10/23/2014: Localized enlarged lymph nodes     Comment:  cervical  12/25/2014: Low back pain 06/26/2014: Morbid obesity (HCC) 04/13/2017: Myalgia due to statin     Comment:  Intolerant to three different statins 01/2011: NSTEMI (non-ST elevated myocardial infarction) (HCC)     Comment:  Subendocardial No date: Personal history of colonic polyps No date: Spinal headache No date: Vertigo  Past Surgical History: No date: ABDOMINAL HYSTERECTOMY No date: CARDIAC CATHETERIZATION No date:  CHOLECYSTECTOMY 08/13/2017: COLONOSCOPY WITH PROPOFOL ; N/A     Comment:  Procedure: COLONOSCOPY WITH PROPOFOL ;  Surgeon: Selena Daily, MD;  Location: [facility] ENDOSCOPY;  Service:               Gastroenterology;  Laterality: N/A; 2012: heart stent  BMI    Body Mass Index: 38.79 kg/m      Reproductive/Obstetrics negative OB ROS                             Anesthesia Physical Anesthesia Plan  ASA: 3  Anesthesia Plan: General   Post-op Pain Management: Minimal or no pain anticipated   Induction: Intravenous  PONV Risk Score and Plan: 2 and Propofol  infusion and TIVA  Airway Management Planned: Natural Airway and Nasal Cannula  Additional Equipment:   Intra-op Plan:   Post-operative Plan:   Informed Consent: I have reviewed the patients History and Physical, chart, labs and discussed the procedure including the risks, benefits and alternatives for the proposed anesthesia with the patient or authorized representative who has indicated his/her understanding and acceptance.     Dental Advisory Given  Plan Discussed with: Anesthesiologist, CRNA and Surgeon  Anesthesia Plan Comments: (Patient consented for risks of anesthesia including but not limited to:  - adverse reactions to medications - risk of airway placement if required - damage to eyes, teeth, lips or other oral mucosa - nerve damage due to positioning  - sore throat or hoarseness - Damage to heart, brain, nerves, lungs, other parts of body or loss of life  Patient voiced understanding and assent.)  Anesthesia Quick Evaluation

## 2023-05-17 LAB — SURGICAL PATHOLOGY

## 2023-05-18 ENCOUNTER — Encounter: Payer: Self-pay | Admitting: Gastroenterology

## 2023-06-13 ENCOUNTER — Encounter: Payer: Self-pay | Admitting: Family Medicine

## 2023-06-13 ENCOUNTER — Ambulatory Visit: Admitting: Family Medicine

## 2023-06-13 VITALS — BP 118/68 | HR 61 | Resp 16 | Ht 64.0 in | Wt 223.0 lb

## 2023-06-13 DIAGNOSIS — E669 Obesity, unspecified: Secondary | ICD-10-CM

## 2023-06-13 DIAGNOSIS — Z7984 Long term (current) use of oral hypoglycemic drugs: Secondary | ICD-10-CM

## 2023-06-13 DIAGNOSIS — N6452 Nipple discharge: Secondary | ICD-10-CM | POA: Diagnosis not present

## 2023-06-13 DIAGNOSIS — E1169 Type 2 diabetes mellitus with other specified complication: Secondary | ICD-10-CM

## 2023-06-13 MED ORDER — BLOOD GLUCOSE TEST VI STRP: ORAL_STRIP | 3 refills | Status: AC

## 2023-06-13 NOTE — Progress Notes (Signed)
 Patient ID: Elizabeth Russo, female    DOB: 11/30/1960, 63 y.o.   MRN: [id number]  PCP: Adeline Hone, PA-C  Chief Complaint  Patient presents with   Breast Problem    Discharge "brown/red color" from L breast, x2 weeks. No pain, itching, or rashes.    Subjective:   Elizabeth Russo is a 63 y.o. female, presents to clinic with CC of the following:  HPI  Pt noticed something abnormal in bra about 2 weeks ago, she wasn't sure if something dropped in there, but she noticed it again 2x, drainage and then brownish discharge the other day when getting out of the shower to left nipple only No skin changes, itching, rash, masses/bumps and swollen lymph nodes  Patient Active Problem List   Diagnosis Date Noted   Rectal polyp 05/16/2023   History of colonic polyps 05/16/2023   Statin myopathy 03/09/2023   S/P cholecystectomy 01/22/2020   Calculus of gallbladder with acute cholecystitis without obstruction    Statin intolerance 03/31/2019   Heart failure with preserved left ventricular function (HFpEF) (HCC) 03/31/2019   Atrial paroxysmal tachycardia (HCC) 03/31/2019   Class 3 severe obesity with serious comorbidity and body mass index (BMI) of 40.0 to 44.9 in adult 03/31/2019   Urinary incontinence 03/31/2019   Myalgia due to statin 04/13/2017   Venous stasis 04/13/2017   History of non-ST elevation myocardial infarction (NSTEMI) 02/05/2015   Hx of percutaneous transluminal coronary angioplasty 02/05/2015   Gout of knee 12/25/2014   Chronic combined systolic and diastolic heart failure (HCC) 05/24/2014   Coronary atherosclerosis 12/19/2013   Hyperlipidemia 12/19/2013   Essential hypertension 12/18/2013   Type 2 diabetes mellitus with obesity (HCC) 12/18/2013   Hypothyroidism 12/18/2013   Dyslipidemia 02/13/2011   History of tobacco use 02/13/2011      Current Outpatient Medications:    albuterol  (VENTOLIN  HFA) 108 (90 Base) MCG/ACT inhaler, Inhale 2 puffs into the lungs every  6 (six) hours as needed for wheezing or shortness of breath., Disp: 8 g, Rfl: 0   amLODipine -valsartan  (EXFORGE ) 10-160 MG tablet, Take 1 tablet by mouth daily., Disp: 90 tablet, Rfl: 1   aspirin EC 81 MG tablet, Take 81 mg by mouth daily. , Disp: , Rfl:    carvedilol  (COREG ) 25 MG tablet, TAKE 1/2 (ONE-HALF) TABLET BY MOUTH IN THE MORNING AND  1/2  TABLET  EVERY  EVENING  WITH  MEALS, Disp: 90 tablet, Rfl: 1   diclofenac sodium (VOLTAREN) 1 % GEL, Apply 2 g topically daily as needed (pain)., Disp: , Rfl:    Evolocumab  (REPATHA ) 140 MG/ML SOSY, Inject 140 mg into the skin every 14 (fourteen) days., Disp: 6 mL, Rfl: 3   famotidine  (PEPCID ) 20 MG tablet, Take 1 tablet (20 mg total) by mouth 2 (two) times daily as needed for heartburn or indigestion., Disp: 180 tablet, Rfl: 1   fluticasone  (FLONASE ) 50 MCG/ACT nasal spray, Place 2 sprays into both nostrils daily., Disp: 16 g, Rfl: 2   levothyroxine  (SYNTHROID ) 137 MCG tablet, Take 1 tablet (137 mcg total) by mouth before breakfast on Tuesdays, Thursdays, Saturdays, and Sundays., Disp: 90 tablet, Rfl: 1   metFORMIN  (GLUCOPHAGE -XR) 500 MG 24 hr tablet, Take 1 tablet (500 mg total) by mouth daily with breakfast., Disp: 90 tablet, Rfl: 3   Semaglutide , 2 MG/DOSE, (OZEMPIC , 2 MG/DOSE,) 8 MG/3ML SOPN, Inject 2 mg into the skin once a week., Disp: 9 mL, Rfl: 3   Allergies  Allergen Reactions   Atorvastatin   Other (See Comments)    Other reaction(s): Muscle Pain   Hydralazine Other (See Comments)    Aggravated gout Aggravated gout   Hydrochlorothiazide Other (See Comments)    Aggravated gout   Lisinopril Cough   Pravastatin Other (See Comments)    Other reaction(s): Muscle Pain   Rosuvastatin Other (See Comments)    Other reaction(s): Muscle Pain     Social History   Tobacco Use   Smoking status: Former    Current packs/day: 0.00    Average packs/day: 0.5 packs/day for 35.2 years (17.6 ttl pk-yrs)    Types: Cigarettes    Start date: 01/09/1978     Quit date: 03/09/2013    Years since quitting: 10.2   Smokeless tobacco: Never  Vaping Use   Vaping status: Never Used  Substance Use Topics   Alcohol use: Yes    Alcohol/week: 0.0 standard drinks of alcohol    Comment: Socially    Drug use: No      Chart Review Today: I personally reviewed active problem list, medication list, allergies, family history, social history, health maintenance, notes from last encounter, lab results, imaging with the patient/caregiver today.   Review of Systems  Constitutional: Negative.   HENT: Negative.    Eyes: Negative.   Respiratory: Negative.    Cardiovascular: Negative.   Gastrointestinal: Negative.   Endocrine: Negative.   Genitourinary: Negative.   Musculoskeletal: Negative.   Skin: Negative.   Allergic/Immunologic: Negative.   Neurological: Negative.   Hematological: Negative.   Psychiatric/Behavioral: Negative.    All other systems reviewed and are negative.      Objective:   Vitals:   06/13/23 1520  BP: 118/68  Pulse: 61  Resp: 16  SpO2: 97%  Weight: 223 lb (101.2 kg)  Height: 5\' 4"  (1.626 m)    Body mass index is 38.28 kg/m.  Physical Exam Vitals and nursing note reviewed.  Constitutional:      General: She is not in acute distress.    Appearance: Normal appearance. She is well-developed. She is obese. She is not ill-appearing, toxic-appearing or diaphoretic.  HENT:     Head: Normocephalic and atraumatic.     Right Ear: External ear normal.     Left Ear: External ear normal.     Nose: Nose normal.  Eyes:     General: No scleral icterus.       Right eye: No discharge.        Left eye: No discharge.     Conjunctiva/sclera: Conjunctivae normal.  Neck:     Trachea: No tracheal deviation.  Cardiovascular:     Rate and Rhythm: Normal rate.  Pulmonary:     Effort: Pulmonary effort is normal. No respiratory distress.     Breath sounds: No stridor.  Chest:  Breasts:    Right: Normal.     Left: Normal. No  swelling, bleeding, inverted nipple, mass, nipple discharge, skin change or tenderness.  Lymphadenopathy:     Upper Body:     Left upper body: No supraclavicular, axillary or pectoral adenopathy.  Skin:    General: Skin is warm and dry.     Findings: No rash.  Neurological:     Mental Status: She is alert.     Motor: No abnormal muscle tone.     Coordination: Coordination normal.     Gait: Gait normal.  Psychiatric:        Mood and Affect: Mood normal.        Behavior: Behavior normal.  Results for orders placed or performed during the hospital encounter of 05/16/23  Surgical pathology   Collection Time: 05/16/23 12:00 AM  Result Value Ref Range   SURGICAL PATHOLOGY      SURGICAL PATHOLOGY [facility] [address] [place], [place] [address] Telephone [phone] or [phone] Fax [phone]  REPORT OF SURGICAL PATHOLOGY   Accession #: [id number] Patient Name: MAKENNA, MACALUSO Visit # : [id number]  MRN: [id number] Physician: Ellis Guys DOB/Age September 08, 1960 (Age: 8) Gender: F Collected Date: 05/16/2023 Received Date: 05/16/2023  FINAL DIAGNOSIS       1. Rectum, polyp(s), cold snare :       - TUBULAR ADENOMA.      - NO HIGH GRADE DYSPLASIA OR MALIGNANCY.       DATE SIGNED OUT: 05/17/2023 ELECTRONIC SIGNATURE Almeda Jacobs M.D., Nupur, Pathologist, Electronic Signature  MICROSCOPIC DESCRIPTION  CASE COMMENTS STAINS USED IN DIAGNOSIS: H&E    CLINICAL HISTORY  SPECIMEN(S) OBTAINED 1. Rectum, polyp(s), Cold Snare  SPECIMEN COMMENTS: SPECIMEN CLINICAL INFORMATION: 1. History of colon polyp, diverticulosis, polyp    Gross Description 1. "Rectal polyp cold snare", received in forma lin with a mild amount of food-like material is a single 1.0 x 0.5 x 0.1 cm white-pink tissue fragment. The tissue is submitted in toto in 1 block (1A).      AMG 05/16/2023        Report signed out from the following  location(s) [facility] [address] [place], [place] [address] CLIA #: [id number]  [facility] [address] [place], [place] [address] CLIA #: [id number]   Glucose, capillary   Collection Time: 05/16/23 10:18 AM  Result Value Ref Range   Glucose-Capillary 88 70 - 99 mg/dL       Assessment & Plan:   1. Breast discharge (Primary) 3 occurances x 2 weeks, none on exam today - recommend imaging, may need labs (PL?) - MM 3D DIAGNOSTIC MAMMOGRAM BILATERAL BREAST; Future - US  BREAST COMPLETE UNI LEFT INC AXILLA; Future  2. Type 2 diabetes mellitus with obesity (HCC) She requests strips for one touch meter May need to get a new kit if not the right brand covered by insurance - Glucose Blood (BLOOD GLUCOSE TEST STRIPS) STRP; Use as directed to monitor FSBS once daily for DM  Dispense: 100 strip; Refill: 3      Adeline Hone, PA-C 06/13/23 3:30 PM

## 2023-06-13 NOTE — Patient Instructions (Signed)
 [facility] [address], [place], [place] [address] Scheduling phone #: [phone]   Call to schedule your imaging

## 2023-06-25 ENCOUNTER — Other Ambulatory Visit: Payer: Self-pay | Admitting: *Deleted

## 2023-06-25 ENCOUNTER — Inpatient Hospital Stay
Admission: RE | Admit: 2023-06-25 | Discharge: 2023-06-25 | Disposition: A | Discharge status: Home / Self Care | Payer: Self-pay | Source: Ambulatory Visit | Attending: Family Medicine | Admitting: Family Medicine

## 2023-06-25 ENCOUNTER — Inpatient Hospital Stay
Admission: RE | Admit: 2023-06-25 | Discharge: 2023-06-25 | Disposition: A | Discharge status: Home / Self Care | Payer: Self-pay | Source: Ambulatory Visit | Attending: Family Medicine

## 2023-06-25 DIAGNOSIS — Z1231 Encounter for screening mammogram for malignant neoplasm of breast: Secondary | ICD-10-CM

## 2023-06-29 ENCOUNTER — Other Ambulatory Visit: Payer: Self-pay | Admitting: Family Medicine

## 2023-06-29 DIAGNOSIS — Z5181 Encounter for therapeutic drug level monitoring: Secondary | ICD-10-CM

## 2023-06-29 DIAGNOSIS — E039 Hypothyroidism, unspecified: Secondary | ICD-10-CM

## 2023-07-03 NOTE — Telephone Encounter (Signed)
 Requested Prescriptions  Refused Prescriptions Disp Refills   levothyroxine  (SYNTHROID ) 137 MCG tablet [Pharmacy Med Name: Levothyroxine  Sodium 137 MCG Oral Tablet] 51 tablet 0    Sig: TAKE 1 TABLET BY MOUTH ONCE DAILY BEFORE BREAKFAST ON  TUESDAYS,  THURSDAYS,  SATURDAYS,  AND  SUNDAYS     Endocrinology:  Hypothyroid Agents Passed - 07/03/2023  9:50 AM      Passed - TSH in normal range and within 360 days    TSH  Date Value Ref Range Status  03/09/2023 0.53 0.40 - 4.50 mIU/L Final         Passed - Valid encounter within last 12 months    Recent Outpatient Visits           2 weeks ago Breast discharge   [facility] Leavy Mole, PA-C   3 months ago Type 2 diabetes mellitus with obesity [facility])   [facility] Leavy Mole, PA-C       Future Appointments             In 2 months Leavy Mole, PA-C [facility], [facility]

## 2023-07-04 ENCOUNTER — Telehealth: Payer: Self-pay | Admitting: Family Medicine

## 2023-07-04 ENCOUNTER — Ambulatory Visit
Admission: RE | Admit: 2023-07-04 | Discharge: 2023-07-04 | Disposition: A | Discharge status: Home / Self Care | Source: Ambulatory Visit | Attending: Family Medicine | Admitting: Family Medicine

## 2023-07-04 ENCOUNTER — Other Ambulatory Visit: Payer: Self-pay | Admitting: Family Medicine

## 2023-07-04 DIAGNOSIS — N6452 Nipple discharge: Secondary | ICD-10-CM

## 2023-07-04 DIAGNOSIS — Z5181 Encounter for therapeutic drug level monitoring: Secondary | ICD-10-CM

## 2023-07-04 DIAGNOSIS — E039 Hypothyroidism, unspecified: Secondary | ICD-10-CM

## 2023-07-04 DIAGNOSIS — R928 Other abnormal and inconclusive findings on diagnostic imaging of breast: Secondary | ICD-10-CM

## 2023-07-04 NOTE — Telephone Encounter (Signed)
 Copied from CRM [id number]. Topic: Clinical - Medication Refill >> Jul 04, 2023  8:33 AM Rosaria E wrote: Medication: levothyroxine  (SYNTHROID ) 137 MCG tablet  Has the patient contacted their pharmacy? Yes (Agent: If no, request that the patient contact the pharmacy for the refill. If patient does not wish to contact the pharmacy document the reason why and proceed with request.) (Agent: If yes, when and what did the pharmacy advise?)  This is the patient's preferred pharmacy:   [facility] [address], [place] - [address] [place] [address] Phone: [phone] Fax: [phone]  Is this the correct pharmacy for this prescription? Yes If no, delete pharmacy and type the correct one.   Has the prescription been filled recently? Yes  Is the patient out of the medication? No, has 3 left   Has the patient been seen for an appointment in the last year OR does the patient have an upcoming appointment? Yes  Can we respond through MyChart? Yes  Agent: Please be advised that Rx refills may take up to 3 business days. We ask that you follow-up with your pharmacy.

## 2023-07-06 ENCOUNTER — Other Ambulatory Visit: Payer: Self-pay | Admitting: Family Medicine

## 2023-07-06 DIAGNOSIS — Z5181 Encounter for therapeutic drug level monitoring: Secondary | ICD-10-CM

## 2023-07-06 DIAGNOSIS — E039 Hypothyroidism, unspecified: Secondary | ICD-10-CM

## 2023-07-06 MED ORDER — LEVOTHYROXINE SODIUM 137 MCG PO TABS: ORAL_TABLET | ORAL | 1 refills | Status: DC

## 2023-07-06 NOTE — Telephone Encounter (Signed)
 Copied from CRM [id number]. Topic: Clinical - Medication Refill >> Jul 06, 2023  9:08 AM Delon HERO wrote: Medication: levothyroxine  (SYNTHROID ) 137 MCG tablet [[id number]]  Has the patient contacted their pharmacy? Yes (Agent: If no, request that the patient contact the pharmacy for the refill. If patient does not wish to contact the pharmacy document the reason why and proceed with request.) (Agent: If yes, when and what did the pharmacy advise?)   [facility] [address], [place] - [address] [place] [address] Phone: [phone] Fax: [phone]  Is this the correct pharmacy for this prescription? Yes If no, delete pharmacy and type the correct one.   Has the prescription been filled recently? Yes  Is the patient out of the medication? Yes  Has the patient been seen for an appointment in the last year OR does the patient have an upcoming appointment? Yes  Can we respond through MyChart? Yes  Agent: Please be advised that Rx refills may take up to 3 business days. We ask that you follow-up with your pharmacy.

## 2023-07-06 NOTE — Telephone Encounter (Signed)
 Requested Prescriptions  Pending Prescriptions Disp Refills   levothyroxine  (SYNTHROID ) 137 MCG tablet 48 tablet 1    Sig: Take 1 tablet (137 mcg total) by mouth before breakfast on Tuesdays, Thursdays, Saturdays, and Sundays.     Endocrinology:  Hypothyroid Agents Passed - 07/06/2023  4:31 PM      Passed - TSH in normal range and within 360 days    TSH  Date Value Ref Range Status  03/09/2023 0.53 0.40 - 4.50 mIU/L Final         Passed - Valid encounter within last 12 months    Recent Outpatient Visits           3 weeks ago Breast discharge   [facility] Leavy Mole, PA-C   3 months ago Type 2 diabetes mellitus with obesity [facility])   [facility] Leavy Mole, PA-C       Future Appointments             In 2 months Leavy Mole, PA-C [facility], [facility]

## 2023-07-09 ENCOUNTER — Inpatient Hospital Stay
Admission: RE | Admit: 2023-07-09 | Discharge: 2023-07-09 | Disposition: A | Discharge status: Home / Self Care | Source: Ambulatory Visit | Attending: Family Medicine

## 2023-07-09 DIAGNOSIS — R928 Other abnormal and inconclusive findings on diagnostic imaging of breast: Secondary | ICD-10-CM | POA: Diagnosis present

## 2023-07-09 MED ORDER — LIDOCAINE 1 % OPTIME INJ - NO CHARGE
5.0000 mL | Freq: Once | INTRAMUSCULAR | Status: AC
  Administered 2023-07-09: 5 mL
  Filled 2023-07-09: qty 6

## 2023-07-09 NOTE — Telephone Encounter (Signed)
 Reordered 07/06/23 #48 1 RF  Requested Prescriptions  Refused Prescriptions Disp Refills   levothyroxine  (SYNTHROID ) 137 MCG tablet [Pharmacy Med Name: Levothyroxine  Sodium 137 MCG Oral Tablet] 51 tablet 0    Sig: TAKE 1 TABLET BY MOUTH ONCE DAILY BEFORE BREAKFAST ON  TUESDAYS,  THURSDAYS,  SATURDAYS,  AND  SUNDAYS     Endocrinology:  Hypothyroid Agents Passed - 07/09/2023 11:33 AM      Passed - TSH in normal range and within 360 days    TSH  Date Value Ref Range Status  03/09/2023 0.53 0.40 - 4.50 mIU/L Final         Passed - Valid encounter within last 12 months    Recent Outpatient Visits           3 weeks ago Breast discharge   [facility] Leavy Mole, PA-C   4 months ago Type 2 diabetes mellitus with obesity [facility])   [facility] Leavy Mole, PA-C       Future Appointments             In 1 month Leavy Mole, PA-C [facility], [facility]

## 2023-07-10 LAB — CYTOLOGY - NON PAP

## 2023-07-19 ENCOUNTER — Telehealth: Payer: Self-pay | Admitting: Family Medicine

## 2023-07-19 NOTE — Telephone Encounter (Signed)
 Copied from CRM [id number]. Topic: General - Other >> Jul 19, 2023  9:33 AM Carlatta H wrote: Reason for CRM: Patient needs to have mri scheduled for breast//Please call

## 2023-07-30 ENCOUNTER — Encounter: Payer: Self-pay | Admitting: Family Medicine

## 2023-08-06 ENCOUNTER — Other Ambulatory Visit: Payer: Self-pay

## 2023-08-06 ENCOUNTER — Telehealth: Payer: Self-pay

## 2023-08-06 ENCOUNTER — Encounter: Payer: Self-pay | Admitting: Family Medicine

## 2023-08-06 DIAGNOSIS — N6452 Nipple discharge: Secondary | ICD-10-CM

## 2023-08-06 NOTE — Telephone Encounter (Signed)
 Additional imaging ordered.

## 2023-08-06 NOTE — Telephone Encounter (Signed)
 Copied from CRM #[id number]. Topic: Clinical - Request for Lab/Test Order >> Aug 06, 2023 12:47 PM Sophia H wrote: Reason for CRM: [facility]  Message to provider - Report was written wrong and the patient is needing a breast MRI, [facility] does not schedule these it was an error on the providers end. Please put in orders for the patient. Phone number for MRI of Breast @ [facility] # [phone]

## 2023-08-06 NOTE — Telephone Encounter (Signed)
 Per [facility] note:  RECOMMENDATION:   1. Please schedule patient for bilateral breast MRI with and without contrast to evaluate persistent LEFT breast bloody nipple discharge and new nipple inversion. Rock Hover RN notified MRI scheduler, Latayvia Mandujano Buntin, on 07/11/2023; patient awaiting call from scheduler with appointment details.    This was done on 6/30 it has been almost a month, can we order?

## 2023-08-10 ENCOUNTER — Ambulatory Visit
Admission: RE | Admit: 2023-08-10 | Discharge: 2023-08-10 | Disposition: A | Discharge status: Home / Self Care | Source: Ambulatory Visit | Attending: Family Medicine | Admitting: Family Medicine

## 2023-08-10 DIAGNOSIS — N6452 Nipple discharge: Secondary | ICD-10-CM | POA: Insufficient documentation

## 2023-08-10 MED ORDER — GADOBUTROL 1 MMOL/ML IV SOLN
10.0000 mL | Freq: Once | INTRAVENOUS | Status: AC | PRN
  Administered 2023-08-10: 10 mL via INTRAVENOUS

## 2023-08-17 ENCOUNTER — Ambulatory Visit: Payer: Self-pay | Admitting: Family Medicine

## 2023-08-20 ENCOUNTER — Other Ambulatory Visit: Payer: Self-pay

## 2023-08-20 ENCOUNTER — Ambulatory Visit: Admitting: Family Medicine

## 2023-08-20 ENCOUNTER — Encounter: Payer: Self-pay | Admitting: Family Medicine

## 2023-08-20 ENCOUNTER — Ambulatory Visit: Payer: Self-pay

## 2023-08-20 VITALS — BP 132/76 | HR 63 | Resp 16 | Ht 64.0 in | Wt 227.0 lb

## 2023-08-20 DIAGNOSIS — T466X5A Adverse effect of antihyperlipidemic and antiarteriosclerotic drugs, initial encounter: Secondary | ICD-10-CM

## 2023-08-20 DIAGNOSIS — M5442 Lumbago with sciatica, left side: Secondary | ICD-10-CM | POA: Diagnosis not present

## 2023-08-20 DIAGNOSIS — E039 Hypothyroidism, unspecified: Secondary | ICD-10-CM

## 2023-08-20 DIAGNOSIS — M25552 Pain in left hip: Secondary | ICD-10-CM | POA: Diagnosis not present

## 2023-08-20 DIAGNOSIS — E785 Hyperlipidemia, unspecified: Secondary | ICD-10-CM

## 2023-08-20 DIAGNOSIS — N6452 Nipple discharge: Secondary | ICD-10-CM

## 2023-08-20 DIAGNOSIS — G720 Drug-induced myopathy: Secondary | ICD-10-CM

## 2023-08-20 DIAGNOSIS — I10 Essential (primary) hypertension: Secondary | ICD-10-CM

## 2023-08-20 MED ORDER — METHOCARBAMOL 500 MG PO TABS: 500.0000 mg | ORAL_TABLET | Freq: Two times a day (BID) | ORAL | 1 refills | Status: AC | PRN

## 2023-08-20 MED ORDER — PREDNISONE 20 MG PO TABS: 40.0000 mg | ORAL_TABLET | Freq: Every day | ORAL | 0 refills | Status: AC

## 2023-08-20 NOTE — Patient Instructions (Signed)
[facility] [address] [place], [place] [address] Phone: [phone] [email or web address]   

## 2023-08-20 NOTE — Progress Notes (Unsigned)
 Patient ID: Elizabeth Russo, female    DOB: 04-07-1960, 63 y.o.   MRN: [id number]  PCP: Leavy Mole, PA-C  Chief Complaint  Patient presents with   Hip Pain    From L hip to knee, x3 weeks. Constant ache w/standing- okay w/sitting. Has not tried any OTC meds, ice, or heat. Pain is worsening.     Subjective:   Elizabeth Russo is a 63 y.o. female, presents to clinic with CC of the following:  HPI  Lab Results  Component Value Date   CHOL 127 08/29/2022   HDL 44 (L) 08/29/2022   LDLCALC 60 08/29/2022   TRIG 149 08/29/2022   CHOLHDL 2.9 08/29/2022     Patient Active Problem List   Diagnosis Date Noted   Rectal polyp 05/16/2023   History of colonic polyps 05/16/2023   Statin myopathy 03/09/2023   S/P cholecystectomy 01/22/2020   Calculus of gallbladder with acute cholecystitis without obstruction    Statin intolerance 03/31/2019   Heart failure with preserved left ventricular function (HFpEF) (HCC) 03/31/2019   Atrial paroxysmal tachycardia (HCC) 03/31/2019   Class 3 severe obesity with serious comorbidity and body mass index (BMI) of 40.0 to 44.9 in adult 03/31/2019   Urinary incontinence 03/31/2019   Myalgia due to statin 04/13/2017   Venous stasis 04/13/2017   History of non-ST elevation myocardial infarction (NSTEMI) 02/05/2015   Hx of percutaneous transluminal coronary angioplasty 02/05/2015   Gout of knee 12/25/2014   Chronic combined systolic and diastolic heart failure (HCC) 05/24/2014   Coronary atherosclerosis 12/19/2013   Hyperlipidemia 12/19/2013   Essential hypertension 12/18/2013   Type 2 diabetes mellitus with obesity (HCC) 12/18/2013   Hypothyroidism 12/18/2013   Dyslipidemia 02/13/2011   History of tobacco use 02/13/2011      Current Outpatient Medications:    albuterol  (VENTOLIN  HFA) 108 (90 Base) MCG/ACT inhaler, Inhale 2 puffs into the lungs every 6 (six) hours as needed for wheezing or shortness of breath., Disp: 8 g, Rfl: 0    amLODipine -valsartan  (EXFORGE ) 10-160 MG tablet, Take 1 tablet by mouth daily., Disp: 90 tablet, Rfl: 1   aspirin EC 81 MG tablet, Take 81 mg by mouth daily. , Disp: , Rfl:    carvedilol  (COREG ) 25 MG tablet, TAKE 1/2 (ONE-HALF) TABLET BY MOUTH IN THE MORNING AND  1/2  TABLET  EVERY  EVENING  WITH  MEALS, Disp: 90 tablet, Rfl: 1   diclofenac sodium (VOLTAREN) 1 % GEL, Apply 2 g topically daily as needed (pain)., Disp: , Rfl:    Evolocumab  (REPATHA ) 140 MG/ML SOSY, Inject 140 mg into the skin every 14 (fourteen) days., Disp: 6 mL, Rfl: 3   famotidine  (PEPCID ) 20 MG tablet, Take 1 tablet (20 mg total) by mouth 2 (two) times daily as needed for heartburn or indigestion., Disp: 180 tablet, Rfl: 1   fluticasone  (FLONASE ) 50 MCG/ACT nasal spray, Place 2 sprays into both nostrils daily., Disp: 16 g, Rfl: 2   Glucose Blood (BLOOD GLUCOSE TEST STRIPS) STRP, Use as directed to monitor FSBS once daily for DM, Disp: 100 strip, Rfl: 3   levothyroxine  (SYNTHROID ) 137 MCG tablet, Take 1 tablet (137 mcg total) by mouth before breakfast on Tuesdays, Thursdays, Saturdays, and Sundays., Disp: 48 tablet, Rfl: 1   metFORMIN  (GLUCOPHAGE -XR) 500 MG 24 hr tablet, Take 1 tablet (500 mg total) by mouth daily with breakfast., Disp: 90 tablet, Rfl: 3   Semaglutide , 2 MG/DOSE, (OZEMPIC , 2 MG/DOSE,) 8 MG/3ML SOPN, Inject 2 mg into  the skin once a week., Disp: 9 mL, Rfl: 3   Allergies  Allergen Reactions   Atorvastatin  Other (See Comments)    Other reaction(s): Muscle Pain   Hydralazine Other (See Comments)    Aggravated gout Aggravated gout   Hydrochlorothiazide Other (See Comments)    Aggravated gout   Lisinopril Cough   Pravastatin Other (See Comments)    Other reaction(s): Muscle Pain   Rosuvastatin Other (See Comments)    Other reaction(s): Muscle Pain     Social History   Tobacco Use   Smoking status: Former    Current packs/day: 0.00    Average packs/day: 0.5 packs/day for 35.2 years (17.6 ttl pk-yrs)     Types: Cigarettes    Start date: 01/09/1978    Quit date: 03/09/2013    Years since quitting: 10.4   Smokeless tobacco: Never  Vaping Use   Vaping status: Never Used  Substance Use Topics   Alcohol use: Yes    Alcohol/week: 0.0 standard drinks of alcohol    Comment: Socially    Drug use: No      Chart Review Today: ***  Review of Systems     Objective:   Vitals:   08/20/23 1300  BP: 132/76  Pulse: 63  Resp: 16  SpO2: 98%  Weight: 227 lb (103 kg)  Height: 5' 4 (1.626 m)    Body mass index is 38.96 kg/m.  Physical Exam   Results for orders placed or performed during the hospital encounter of 07/09/23  Cytology - Non PAP; Left breast, 9:00/UIQ, ectatic duct, <1 mL bloody fluid   Collection Time: 07/09/23 12:00 AM  Result Value Ref Range   CYTOLOGY - NON GYN      CYTOLOGY - NON PAP [facility] [address] [place], [place] [address] Telephone [phone] or [phone] Fax [phone]  CYTOPATHOLOGY REPORT   Accession #: [id number] Patient Name: Elizabeth Russo Visit # : [id number]  MRN: [id number] Physician: Anice PONCE Rush DOB/Age 09/20/1960 (Age: 52) Gender: F Collected Date: 07/09/2023 Received Date: 07/09/2023  FINAL DIAGNOSIS STATEMENT Of SPECIMEN ADEQUACY:  INTERPRETATION(S):      - PREDOMINANTLY BLOOD WITH RARE REACTIVE EPITHELIAL CELLS.      - NEGATIVE FOR MALIGNACY.      DATE SIGNED OUT: 07/10/2023 ELECTRONIC SIGNATURE : Janel Md, Rexene , Pathologist, Electronic Signature   CASE COMMENTS   CLINICAL HISTORY  SOURCE OF SPECIMEN(S) Breast, left, needle core biopsy  SPECIMEN COMMENTS: 1. Aspirated at 0825, copious bloody nipple discharge and intermittent nipple inversion, suspected mass aspirated entirely SPECIMEN CLINICAL INFORMATION: 1. Eval for atypia     Gross Description Specimen: Received is/are 1cc of red fluid in a sterile syringe      Prepared:      # Smears: 0      #  Concentration Technique Slides (i.e. ThinPrep): 1      # Cell Block: 1      # Diff-Quick Stain: 0        Report signed out from the following location(s) [place] [address] [place], [place] [address] CLIA #: [id number]  [facility] [address] [place], [place] [address] CLIA #: [id number]        Assessment & Plan:   ***     Michelene Cower, PA-C 08/20/23 1:18 PM

## 2023-08-20 NOTE — Telephone Encounter (Signed)
 FYI Only or Action Required?: FYI only for provider.  Patient was last seen in primary care on 11/01/2020 by Rumball, Alison M, DO.  Called Nurse Triage reporting Pain.  Symptoms began x 2 weeks.  Interventions attempted: Nothing.  Symptoms are: gradually worsening.  Triage Disposition: See PCP When Office is Open (Within 3 Days)  Patient/caregiver understands and will follow disposition?: Yes     Copied from CRM #[id number]. Topic: Clinical - Red Word Triage >> Aug 20, 2023  8:25 AM Deaijah H wrote: Red Word that prompted transfer to Nurse Triage: Pain from hip to knee. When standing get tingling around knee, little swelling around knee Reason for Disposition  [1] MODERATE pain (e.g., interferes with normal activities, limping) AND [2] present > 3 days  Answer Assessment - Initial Assessment Questions 1. ONSET: When did the pain start?   X 2 weeks 2. LOCATION: Where is the pain located?      Left hip/buttocks area to knee 3. PAIN: How bad is the pain?    (Scale 1-10; or mild, moderate, severe)     8/10 4. WORK OR EXERCISE: Has there been any recent work or exercise that involved this part of the body?      no 5. CAUSE: What do you think is causing the leg pain?     unknown 6. OTHER SYMPTOMS: Do you have any other symptoms? (e.g., chest pain, back pain, breathing difficulty, swelling, rash, fever, numbness, weakness)     Tingling around the left knee , swelling around knee 7. PREGNANCY: Is there any chance you are pregnant? When was your last menstrual period?     na  Protocols used: Leg Pain-A-AH

## 2023-08-30 ENCOUNTER — Encounter: Payer: Self-pay | Admitting: Family Medicine

## 2023-09-03 ENCOUNTER — Telehealth: Payer: Self-pay | Admitting: Family Medicine

## 2023-09-03 NOTE — Telephone Encounter (Unsigned)
 Copied from CRM [id number]. Topic: Referral - Question >> Sep 03, 2023  2:34 PM Debby BROCKS wrote: Reason for CRM: Andrea called on behalf of the patient with a referral question. Fax information was sent over for Dr. Marea but the patient has the appointment in the hospital with Dr. Cesar. She wants to know which doctor should be taking over the referral

## 2023-09-03 NOTE — Telephone Encounter (Signed)
 The breast imaging came back to me saying PCP would have to order the MRI so I did, then results said she needed a breast surgeon consult and we discussed in at her appt and she wanted a surgeon at [facility] so I did the referral. If there is more than one referral then its up to her who she would like to see

## 2023-09-04 NOTE — Telephone Encounter (Signed)
 Spoke with Elizabeth Russo and clarified. Patient has an appt today with Dr. Cesar through [facility], and since Dr. Marea is a duplicate request they cancelled Dr. Marea since it is the same office.

## 2023-09-06 ENCOUNTER — Ambulatory Visit: Payer: 59 | Admitting: Family Medicine

## 2023-09-06 ENCOUNTER — Other Ambulatory Visit: Payer: Self-pay | Admitting: Family Medicine

## 2023-09-06 ENCOUNTER — Encounter: Payer: Self-pay | Admitting: Family Medicine

## 2023-09-06 VITALS — BP 128/78 | HR 62 | Resp 16 | Ht 64.0 in | Wt 226.0 lb

## 2023-09-06 DIAGNOSIS — E039 Hypothyroidism, unspecified: Secondary | ICD-10-CM

## 2023-09-06 DIAGNOSIS — I10 Essential (primary) hypertension: Secondary | ICD-10-CM

## 2023-09-06 DIAGNOSIS — E1169 Type 2 diabetes mellitus with other specified complication: Secondary | ICD-10-CM

## 2023-09-06 DIAGNOSIS — K219 Gastro-esophageal reflux disease without esophagitis: Secondary | ICD-10-CM

## 2023-09-06 DIAGNOSIS — Z7984 Long term (current) use of oral hypoglycemic drugs: Secondary | ICD-10-CM

## 2023-09-06 DIAGNOSIS — Z5181 Encounter for therapeutic drug level monitoring: Secondary | ICD-10-CM

## 2023-09-06 DIAGNOSIS — E785 Hyperlipidemia, unspecified: Secondary | ICD-10-CM

## 2023-09-06 DIAGNOSIS — E669 Obesity, unspecified: Secondary | ICD-10-CM

## 2023-09-06 DIAGNOSIS — I480 Paroxysmal atrial fibrillation: Secondary | ICD-10-CM

## 2023-09-06 DIAGNOSIS — I5030 Unspecified diastolic (congestive) heart failure: Secondary | ICD-10-CM

## 2023-09-06 MED ORDER — FAMOTIDINE 20 MG PO TABS: 20.0000 mg | ORAL_TABLET | Freq: Two times a day (BID) | ORAL | 1 refills | Status: AC | PRN

## 2023-09-06 MED ORDER — PANTOPRAZOLE SODIUM 40 MG PO TBEC: 40.0000 mg | DELAYED_RELEASE_TABLET | Freq: Every day | ORAL | 1 refills | Status: AC

## 2023-09-06 MED ORDER — AMLODIPINE BESYLATE-VALSARTAN 10-160 MG PO TABS: 1.0000 | ORAL_TABLET | Freq: Every day | ORAL | 1 refills | Status: AC

## 2023-09-06 NOTE — Progress Notes (Unsigned)
 Name: Elizabeth Russo   MRN: [id number]    DOB: 17-Sep-1960   Date:09/06/2023       Progress Note  Chief Complaint  Patient presents with   Medical Management of Chronic Issues    6 month follow-up   Hypertension   Diabetes     Subjective:   Elizabeth Russo is a 63 y.o. female, presents to clinic for routine follow up on chronic conditions  Back and hip pain working with [facility], frustrated with pain   Unclear what med dosing she is taking, was doing every other day 137 and 150 mcg Lab Results  Component Value Date   TSH 0.53 03/09/2023   Discussed the use of AI scribe software for clinical note transcription with the patient, who gave verbal consent to proceed.  History of Present Illness Elizabeth Russo is a 63 year old female who presents with worsening nerve pain and sciatica.  Neuropathic pain and sciatica - Constant, shooting nerve pain primarily in the leg - Pain sometimes causes leg to give out - Pain disrupts sleep - Pain managed with nerve medication and pain pills, but relief is short-lived - Ice provides instant relief - Heat is beneficial for back discomfort - Uses lidocaine  patches and Voltaren gel topically - Takes Tylenol , ibuprofen , muscle relaxers, and nerve pain medication  Hip and buttock pain - Pain localized to the buttocks area - X-ray of the hip has been performed  Neuromuscular symptoms and vitamin d  deficiency - Low vitamin D  - Presence of neuromuscular symptoms  Hypertension - Blood pressure medication is effective at rest - Blood pressure increases with activity  Cholesterol management - On Repatha  for cholesterol management  Diabetes management - Takes Ozempic   Thyroid  disorder - Takes levothyroxine , alternating between 137 mcg and 150 mcg, with additional doses on weekends  Other medications - Takes 81 mg baby aspirin - Takes Pepcid  almost nightly      Current Outpatient Medications:    albuterol  (VENTOLIN  HFA)  108 (90 Base) MCG/ACT inhaler, Inhale 2 puffs into the lungs every 6 (six) hours as needed for wheezing or shortness of breath., Disp: 8 g, Rfl: 0   amLODipine -valsartan  (EXFORGE ) 10-160 MG tablet, Take 1 tablet by mouth daily., Disp: 90 tablet, Rfl: 1   aspirin EC 81 MG tablet, Take 81 mg by mouth daily. , Disp: , Rfl:    carvedilol  (COREG ) 25 MG tablet, TAKE 1/2 (ONE-HALF) TABLET BY MOUTH IN THE MORNING AND  1/2  TABLET  EVERY  EVENING  WITH  MEALS, Disp: 90 tablet, Rfl: 1   diclofenac sodium (VOLTAREN) 1 % GEL, Apply 2 g topically daily as needed (pain)., Disp: , Rfl:    Evolocumab  (REPATHA ) 140 MG/ML SOSY, Inject 140 mg into the skin every 14 (fourteen) days., Disp: 6 mL, Rfl: 3   famotidine  (PEPCID ) 20 MG tablet, Take 1 tablet (20 mg total) by mouth 2 (two) times daily as needed for heartburn or indigestion., Disp: 180 tablet, Rfl: 1   fluticasone  (FLONASE ) 50 MCG/ACT nasal spray, Place 2 sprays into both nostrils daily., Disp: 16 g, Rfl: 2   Glucose Blood (BLOOD GLUCOSE TEST STRIPS) STRP, Use as directed to monitor FSBS once daily for DM, Disp: 100 strip, Rfl: 3   levothyroxine  (SYNTHROID ) 137 MCG tablet, Take 1 tablet (137 mcg total) by mouth before breakfast on Tuesdays, Thursdays, Saturdays, and Sundays., Disp: 48 tablet, Rfl: 1   metFORMIN  (GLUCOPHAGE -XR) 500 MG 24 hr tablet, Take 1 tablet (500 mg total)  by mouth daily with breakfast., Disp: 90 tablet, Rfl: 3   methocarbamol  (ROBAXIN ) 500 MG tablet, Take 1 tablet (500 mg total) by mouth 2 (two) times daily as needed for muscle spasms (muscle tightness/pan). Can be sedating, do not drive while taking medications, Disp: 30 tablet, Rfl: 1   Semaglutide , 2 MG/DOSE, (OZEMPIC , 2 MG/DOSE,) 8 MG/3ML SOPN, Inject 2 mg into the skin once a week., Disp: 9 mL, Rfl: 3  Patient Active Problem List   Diagnosis Date Noted   Rectal polyp 05/16/2023   History of colonic polyps 05/16/2023   Statin myopathy 03/09/2023   S/P cholecystectomy 01/22/2020    Calculus of gallbladder with acute cholecystitis without obstruction    Statin intolerance 03/31/2019   Heart failure with preserved left ventricular function (HFpEF) (HCC) 03/31/2019   Atrial paroxysmal tachycardia (HCC) 03/31/2019   Class 3 severe obesity with serious comorbidity and body mass index (BMI) of 40.0 to 44.9 in adult 03/31/2019   Urinary incontinence 03/31/2019   Myalgia due to statin 04/13/2017   Venous stasis 04/13/2017   History of non-ST elevation myocardial infarction (NSTEMI) 02/05/2015   Hx of percutaneous transluminal coronary angioplasty 02/05/2015   Gout of knee 12/25/2014   Chronic combined systolic and diastolic heart failure (HCC) 05/24/2014   Coronary atherosclerosis 12/19/2013   Hyperlipidemia 12/19/2013   Essential hypertension 12/18/2013   Type 2 diabetes mellitus with obesity (HCC) 12/18/2013   Hypothyroidism 12/18/2013   Dyslipidemia 02/13/2011   History of tobacco use 02/13/2011    Past Surgical History:  Procedure Laterality Date   ABDOMINAL HYSTERECTOMY     CARDIAC CATHETERIZATION     CHOLECYSTECTOMY     COLONOSCOPY N/A 05/16/2023   Procedure: COLONOSCOPY;  Surgeon: Unk Corinn Skiff, MD;  Location: [facility] ENDOSCOPY;  Service: Gastroenterology;  Laterality: N/A;   COLONOSCOPY WITH PROPOFOL  N/A 08/13/2017   Procedure: COLONOSCOPY WITH PROPOFOL ;  Surgeon: Unk Corinn Skiff, MD;  Location: [facility] ENDOSCOPY;  Service: Gastroenterology;  Laterality: N/A;   heart stent  2012   POLYPECTOMY  05/16/2023   Procedure: POLYPECTOMY, INTESTINE;  Surgeon: Unk Corinn Skiff, MD;  Location: [facility] ENDOSCOPY;  Service: Gastroenterology;;    Family History  Problem Relation Age of Onset   Heart disease Mother    Diabetes Mother    Hypertension Mother    Hyperlipidemia Mother    Thyroid  disease Mother    Cancer Father        colon   Diabetes Father    Hypertension Father    Diabetes Sister    Hypertension Sister    Heart disease Sister    Hyperlipidemia  Sister    Kidney disease Sister    Heart disease Sister    Diabetes Sister    Heart disease Sister    Arthritis Sister    Cancer Maternal Grandfather        lung   Heart attack Paternal Grandfather    Breast cancer Neg Hx     Social History   Tobacco Use   Smoking status: Former    Current packs/day: 0.00    Average packs/day: 0.5 packs/day for 35.2 years (17.6 ttl pk-yrs)    Types: Cigarettes    Start date: 01/09/1978    Quit date: 03/09/2013    Years since quitting: 10.5   Smokeless tobacco: Never  Vaping Use   Vaping status: Never Used  Substance Use Topics   Alcohol use: Yes    Alcohol/week: 0.0 standard drinks of alcohol    Comment: Socially  Drug use: No     Allergies  Allergen Reactions   Atorvastatin  Other (See Comments)    Other reaction(s): Muscle Pain   Hydralazine Other (See Comments)    Aggravated gout Aggravated gout   Hydrochlorothiazide Other (See Comments)    Aggravated gout   Lisinopril Cough   Pravastatin Other (See Comments)    Other reaction(s): Muscle Pain   Rosuvastatin Other (See Comments)    Other reaction(s): Muscle Pain    Health Maintenance  Topic Date Due   HEMOGLOBIN A1C  09/06/2023   OPHTHALMOLOGY EXAM  09/06/2023 (Originally 07/17/2023)   COVID-19 Vaccine (6 - 2024-25 season) 09/22/2023 (Originally 09/10/2022)   INFLUENZA VACCINE  04/08/2024 (Originally 08/10/2023)   Diabetic kidney evaluation - eGFR measurement  03/08/2024   Diabetic kidney evaluation - Urine ACR  03/08/2024   FOOT EXAM  03/08/2024   MAMMOGRAM  08/09/2024   Colonoscopy  05/15/2028   DTaP/Tdap/Td (2 - Td or Tdap) 04/10/2030   Pneumococcal Vaccine: 50+ Years  Completed   Hepatitis C Screening  Completed   HIV Screening  Completed   Zoster Vaccines- Shingrix   Completed   Hepatitis B Vaccines 19-59 Average Risk  Aged Out   HPV VACCINES  Aged Out   Meningococcal B Vaccine  Aged Out    Chart Review Today: I personally reviewed active problem list, medication  list, allergies, family history, social history, health maintenance, notes from last encounter, lab results, imaging with the patient/caregiver today.   Review of Systems  Constitutional: Negative.   HENT: Negative.    Eyes: Negative.   Respiratory: Negative.    Cardiovascular: Negative.   Gastrointestinal: Negative.   Endocrine: Negative.   Genitourinary: Negative.   Musculoskeletal: Negative.   Skin: Negative.   Allergic/Immunologic: Negative.   Neurological: Negative.   Hematological: Negative.   Psychiatric/Behavioral: Negative.    All other systems reviewed and are negative.    Objective:   Vitals:   09/06/23 1020  BP: 128/78  Pulse: 62  Resp: 16  SpO2: 97%  Weight: 226 lb (102.5 kg)  Height: 5' 4 (1.626 m)    Body mass index is 38.79 kg/m.  Physical Exam Vitals and nursing note reviewed.  Constitutional:      General: She is not in acute distress.    Appearance: Normal appearance. She is well-developed. She is obese. She is not ill-appearing, toxic-appearing or diaphoretic.  HENT:     Head: Normocephalic and atraumatic.     Right Ear: External ear normal.     Left Ear: External ear normal.     Nose: Nose normal.  Eyes:     General: No scleral icterus.       Right eye: No discharge.        Left eye: No discharge.     Conjunctiva/sclera: Conjunctivae normal.  Neck:     Trachea: No tracheal deviation.  Cardiovascular:     Rate and Rhythm: Normal rate and regular rhythm.     Pulses: Normal pulses.     Heart sounds: Normal heart sounds.  Pulmonary:     Effort: Pulmonary effort is normal. No respiratory distress.     Breath sounds: No stridor.  Skin:    General: Skin is warm and dry.     Findings: No rash.  Neurological:     Mental Status: She is alert. Mental status is at baseline.     Motor: No abnormal muscle tone.     Coordination: Coordination normal.     Gait: Gait  normal.  Psychiatric:        Mood and Affect: Mood normal.        Behavior:  Behavior normal.               Assessment & Plan:     Assessment & Plan  Pt here for routine 6 month f/up on chronic conditions and meds  Essential hypertension Assessment & Plan: Well-controlled with amlodipine -valsartan  and carvedilol . - Continue current antihypertensive regimen with amlodipine -valsartan  and carvedilol . BP Readings from Last 3 Encounters:  09/06/23 128/78  08/20/23 132/76  06/13/23 118/68    Orders: -     amLODIPine  Besylate-Valsartan ; Take 1 tablet by mouth daily.  Dispense: 90 tablet; Refill: 1  Gastroesophageal reflux disease, unspecified whether esophagitis present Assessment & Plan: Poorly controlled sx Intermittent symptoms possibly worsened by NSAIDs. Discussed pantoprazole  as a stronger acid reducer than famotidine . Explained mechanism and need for a few days to see effects. Discussed potential prolonged use if NSAIDs continue. - Prescribe pantoprazole  once daily for acid reduction. - Continue famotidine  as needed for breakthrough symptoms. - Monitor for improvement over a two-week period. -GI protection with increased frequency of NSAIDs  Orders: -     Famotidine ; Take 1 tablet (20 mg total) by mouth 2 (two) times daily as needed for heartburn or indigestion.  Dispense: 180 tablet; Refill: 1 -     Pantoprazole  Sodium; Take 1 tablet (40 mg total) by mouth daily.  Dispense: 30 tablet; Refill: 1  Hypothyroidism, unspecified type Assessment & Plan: Pt unclear about what doses she is taking - will do pharmacy dispense investigation, get labs and clarify current mgmt Overall euthyroid  About 6 months ago advised pt to reduce dose and do f/up TSH in 6-8 weeks She did not get or possibly understand these instructions so she continued taking the same meds and doses Today she endorses sx for both hyper and hypo thyroid  - likely euthyroid Will recheck TSH and adjust med doses to keep labs in normal range Lab Results  Component Value Date   TSH  0.54 08/29/2022   TSH 2.77 11/23/2021   TSH 2.66 06/03/2021   TSH 2.27 04/09/2020   TSH 3.28 10/10/2019    Orders: -     TSH  Type 2 diabetes mellitus with obesity (HCC) Assessment & Plan: Has been well controlled  Managed with semaglutide  and metformin . Blood glucose monitoring ongoing. - Continue current diabetes management with semaglutide  and metformin . - Continue blood glucose monitoring as needed  Due for DM eye exam On arb Statin intolerant DM foot exam UTD   Orders: -     Hemoglobin A1c -     Comprehensive metabolic panel with GFR  Dyslipidemia Assessment & Plan: Managed with evolocumab  and aspirin. Lipid levels stable. - Continue evolocumab  and aspirin for lipid management. Due for lipids  Orders: -     Lipid panel  Encounter for medication monitoring -     amLODIPine  Besylate-Valsartan ; Take 1 tablet by mouth daily.  Dispense: 90 tablet; Refill: 1 -     Hemoglobin A1c -     Comprehensive metabolic panel with GFR -     TSH -     Lipid panel      left-sided low back pain with left-sided sciatica and left hip pain - managing with Ortho - poorly controlled  Chronic severe pain affecting daily activities. Current management includes nerve pain medication, pain pills, and topical treatments. Discussed ice and heat for relief. Considered physical therapy and MRI. Discussed  nerve pain medication side effects and alternative options like Lyrica. - Continue current pain management regimen including nerve pain medication  - Use ice or heat based on personal preference for symptom relief. - Consider physical therapy for rehabilitation and strengthening - ask ortho if they are planning this - Plan for MRI to further evaluate the condition. - Discuss alternative medications like Lyrica if current regimen is not effective.     Recording duration: 15 minutes    Return in about 6 months (around 03/08/2024) for Routine follow-up.   Michelene Cower, PA-C 09/06/23  10:36 AM

## 2023-09-07 ENCOUNTER — Ambulatory Visit: Payer: Self-pay | Admitting: Family Medicine

## 2023-09-07 ENCOUNTER — Encounter: Payer: Self-pay | Admitting: Family Medicine

## 2023-09-07 DIAGNOSIS — Z5181 Encounter for therapeutic drug level monitoring: Secondary | ICD-10-CM

## 2023-09-07 DIAGNOSIS — E039 Hypothyroidism, unspecified: Secondary | ICD-10-CM

## 2023-09-07 DIAGNOSIS — K219 Gastro-esophageal reflux disease without esophagitis: Secondary | ICD-10-CM | POA: Insufficient documentation

## 2023-09-07 LAB — COMPREHENSIVE METABOLIC PANEL WITH GFR
AG Ratio: 1.2 (calc) (ref 1.0–2.5)
ALT: 13 U/L (ref 6–29)
AST: 14 U/L (ref 10–35)
Albumin: 3.9 g/dL (ref 3.6–5.1)
Alkaline phosphatase (APISO): 65 U/L (ref 37–153)
BUN: 8 mg/dL (ref 7–25)
CO2: 26 mmol/L (ref 20–32)
Calcium: 8.8 mg/dL (ref 8.6–10.4)
Chloride: 104 mmol/L (ref 98–110)
Creat: 0.88 mg/dL (ref 0.50–1.05)
Globulin: 3.3 g/dL (ref 1.9–3.7)
Glucose, Bld: 94 mg/dL (ref 65–99)
Potassium: 4.1 mmol/L (ref 3.5–5.3)
Sodium: 137 mmol/L (ref 135–146)
Total Bilirubin: 0.3 mg/dL (ref 0.2–1.2)
Total Protein: 7.2 g/dL (ref 6.1–8.1)
eGFR: 74 mL/min/1.73m2 (ref >=60)

## 2023-09-07 LAB — TSH: TSH: 9.45 m[IU]/L — ABNORMAL HIGH (ref 0.40–4.50)

## 2023-09-07 LAB — HEMOGLOBIN A1C
Hgb A1c MFr Bld: 5.9 % — ABNORMAL HIGH (ref <5.7)
Mean Plasma Glucose: 123 mg/dL
eAG (mmol/L): 6.8 mmol/L

## 2023-09-07 LAB — LIPID PANEL
Cholesterol: 112 mg/dL (ref <200)
HDL: 39 mg/dL — ABNORMAL LOW (ref >=50)
LDL Cholesterol (Calc): 57 mg/dL
Non-HDL Cholesterol (Calc): 73 mg/dL (ref <130)
Total CHOL/HDL Ratio: 2.9 (calc) (ref <5.0)
Triglycerides: 79 mg/dL (ref <150)

## 2023-09-07 MED ORDER — LEVOTHYROXINE SODIUM 137 MCG PO TABS: 137.0000 ug | ORAL_TABLET | Freq: Every day | ORAL | 1 refills | Status: AC

## 2023-09-07 NOTE — Assessment & Plan Note (Signed)
 Pt unclear about what doses she is taking - will do pharmacy dispense investigation, get labs and clarify current mgmt Overall euthyroid  About 6 months ago advised pt to reduce dose and do f/up TSH in 6-8 weeks She did not get or possibly understand these instructions so she continued taking the same meds and doses Today she endorses sx for both hyper and hypo thyroid  - likely euthyroid Will recheck TSH and adjust med doses to keep labs in normal range Lab Results  Component Value Date   TSH 0.54 08/29/2022   TSH 2.77 11/23/2021   TSH 2.66 06/03/2021   TSH 2.27 04/09/2020   TSH 3.28 10/10/2019

## 2023-09-07 NOTE — Assessment & Plan Note (Signed)
 Poorly controlled sx Intermittent symptoms possibly worsened by NSAIDs. Discussed pantoprazole  as a stronger acid reducer than famotidine . Explained mechanism and need for a few days to see effects. Discussed potential prolonged use if NSAIDs continue. - Prescribe pantoprazole  once daily for acid reduction. - Continue famotidine  as needed for breakthrough symptoms. - Monitor for improvement over a two-week period. -GI protection with increased frequency of NSAIDs

## 2023-09-07 NOTE — Assessment & Plan Note (Signed)
 Has been well controlled  Managed with semaglutide  and metformin . Blood glucose monitoring ongoing. - Continue current diabetes management with semaglutide  and metformin . - Continue blood glucose monitoring as needed  Due for DM eye exam On arb Statin intolerant DM foot exam UTD

## 2023-09-07 NOTE — Assessment & Plan Note (Signed)
 Managed with evolocumab  and aspirin. Lipid levels stable. - Continue evolocumab  and aspirin for lipid management. Due for lipids

## 2023-09-07 NOTE — Telephone Encounter (Signed)
 Requested Prescriptions  Refused Prescriptions Disp Refills   amLODipine -valsartan  (EXFORGE ) 10-160 MG tablet [Pharmacy Med Name: amLODIPine  Besylate-Valsartan  10-160 MG Oral Tablet] 90 tablet 0    Sig: Take 1 tablet by mouth once daily     Cardiovascular: CCB + ARB Combos Passed - 09/07/2023  2:30 PM      Passed - K in normal range and within 180 days    Potassium  Date Value Ref Range Status  09/06/2023 4.1 3.5 - 5.3 mmol/L Final  01/19/2011 3.3 (L) 3.5 - 5.1 mmol/L Final         Passed - Cr in normal range and within 180 days    Creat  Date Value Ref Range Status  09/06/2023 0.88 0.50 - 1.05 mg/dL Final   Creatinine, Urine  Date Value Ref Range Status  03/09/2023 77 20 - 275 mg/dL Final         Passed - Na in normal range and within 180 days    Sodium  Date Value Ref Range Status  09/06/2023 137 135 - 146 mmol/L Final  05/04/2015 139 134 - 144 mmol/L Final  01/19/2011 142 136 - 145 mmol/L Final         Passed - Patient is not pregnant      Passed - Last BP in normal range    BP Readings from Last 1 Encounters:  09/06/23 128/78         Passed - Valid encounter within last 6 months    Recent Outpatient Visits           Yesterday Type 2 diabetes mellitus with obesity [facility])   [place] [facility] Leavy Mole, PA-C   2 weeks ago Left hip pain   [facility] Leavy Mole, [facility]-C   2 months ago Breast discharge   [facility] Leavy Mole, [facility]-C   6 months ago Type 2 diabetes mellitus with obesity [facility])   [place] [facility] Leavy Mole, PA-C       Future Appointments             In 6 months Tapia, Leisa, PA-C [facility], [place]             famotidine  (PEPCID ) 20 MG tablet [Pharmacy Med Name: Famotidine  20 MG Oral Tablet] 180 tablet 0    Sig: TAKE 1 TABLET BY MOUTH TWICE DAILY AS NEEDED FOR HEARTBURN OR INDIGESTION     Gastroenterology:   H2 Antagonists Passed - 09/07/2023  2:30 PM      Passed - Valid encounter within last 12 months    Recent Outpatient Visits           Yesterday Type 2 diabetes mellitus with obesity [facility])    [facility] Leavy Mole, PA-C   2 weeks ago Left hip pain   [facility] Leavy Mole, PA-C   2 months ago Breast discharge   [facility] Leavy Mole, PA-C   6 months ago Type 2 diabetes mellitus with obesity [facility])    [facility] Leavy Mole, PA-C       Future Appointments             In 6 months Leavy Mole, PA-C [facility], [place]

## 2023-09-07 NOTE — Assessment & Plan Note (Signed)
 Well-controlled with amlodipine -valsartan  and carvedilol . - Continue current antihypertensive regimen with amlodipine -valsartan  and carvedilol . BP Readings from Last 3 Encounters:  09/06/23 128/78  08/20/23 132/76  06/13/23 118/68

## 2023-10-14 ENCOUNTER — Other Ambulatory Visit: Payer: Self-pay | Admitting: Cardiology

## 2023-10-14 DIAGNOSIS — E782 Mixed hyperlipidemia: Secondary | ICD-10-CM

## 2023-10-14 DIAGNOSIS — I2510 Atherosclerotic heart disease of native coronary artery without angina pectoris: Secondary | ICD-10-CM

## 2023-10-16 ENCOUNTER — Other Ambulatory Visit: Payer: Self-pay | Admitting: Family Medicine

## 2023-10-17 NOTE — Telephone Encounter (Signed)
 Requested Prescriptions  Pending Prescriptions Disp Refills   metFORMIN  (GLUCOPHAGE -XR) 500 MG 24 hr tablet [Pharmacy Med Name: metFORMIN  HCl ER 500 MG Oral Tablet Extended Release 24 Hour] 270 tablet 0    Sig: Take 1 tablet by mouth once daily with breakfast     Endocrinology:  Diabetes - Biguanides Failed - 10/17/2023  3:19 PM      Failed - CBC within normal limits and completed in the last 12 months    WBC  Date Value Ref Range Status  08/29/2022 5.3 3.8 - 10.8 Thousand/uL Final   RBC  Date Value Ref Range Status  08/29/2022 4.75 3.80 - 5.10 Million/uL Final   Hemoglobin  Date Value Ref Range Status  08/29/2022 13.8 11.7 - 15.5 g/dL Final  98/78/7978 86.1 11.1 - 15.9 g/dL Final   HCT  Date Value Ref Range Status  08/29/2022 41.5 35.0 - 45.0 % Final   Hematocrit  Date Value Ref Range Status  01/30/2019 40.6 34.0 - 46.6 % Final   MCHC  Date Value Ref Range Status  08/29/2022 33.3 32.0 - 36.0 g/dL Final   [facility]  Date Value Ref Range Status  08/29/2022 29.1 27.0 - 33.0 pg Final   MCV  Date Value Ref Range Status  08/29/2022 87.4 80.0 - 100.0 fL Final  01/30/2019 87 79 - 97 fL Final  01/19/2011 92 80 - 100 fL Final   No results found for: PLTCOUNTKUC, LABPLAT, POCPLA RDW  Date Value Ref Range Status  08/29/2022 13.1 11.0 - 15.0 % Final  01/30/2019 13.2 11.7 - 15.4 % Final  01/19/2011 14.0 11.5 - 14.5 % Final         Passed - Cr in normal range and within 360 days    Creat  Date Value Ref Range Status  09/06/2023 0.88 0.50 - 1.05 mg/dL Final   Creatinine, Urine  Date Value Ref Range Status  03/09/2023 77 20 - 275 mg/dL Final         Passed - HBA1C is between 0 and 7.9 and within 180 days    HbA1c, POC (prediabetic range)  Date Value Ref Range Status  04/22/2018 5.9 5.7 - 6.4 % Final   HbA1c, POC (controlled diabetic range)  Date Value Ref Range Status  04/22/2018 5.9 0.0 - 7.0 % Final   HbA1c POC (<> result, manual entry)  Date Value Ref Range  Status  04/22/2018 5.9 4.0 - 5.6 % Final   Hgb A1c MFr Bld  Date Value Ref Range Status  09/06/2023 5.9 (H) <5.7 % Final    Comment:    For someone without known diabetes, a hemoglobin  A1c value between 5.7% and 6.4% is consistent with prediabetes and should be confirmed with a  follow-up test. . For someone with known diabetes, a value <7% indicates that their diabetes is well controlled. A1c targets should be individualized based on duration of diabetes, age, comorbid conditions, and other considerations. . This assay result is consistent with an increased risk of diabetes. . Currently, no consensus exists regarding use of hemoglobin A1c for diagnosis of diabetes for children. .          Passed - eGFR in normal range and within 360 days    GFR, Est African American  Date Value Ref Range Status  04/09/2020 92 > OR = 60 mL/min/1.40m2 Final   GFR, Est Non African American  Date Value Ref Range Status  04/09/2020 79 > OR = 60 mL/min/1.48m2 Final   GFR  Date Value Ref Range Status  12/18/2013 92.47 >60.00 mL/min Final   eGFR  Date Value Ref Range Status  09/06/2023 74 > OR = 60 mL/min/1.42m2 Final         Passed - B12 Level in normal range and within 720 days    Vitamin B-12  Date Value Ref Range Status  08/29/2022 524 200 - 1,100 pg/mL Final         Passed - Valid encounter within last 6 months    Recent Outpatient Visits           1 month ago Essential hypertension   [place] [facility] Leavy Mole, PA-C   1 month ago Left hip pain   [facility] Le[facility] Mole, [facility]-C   4 months ago Breast discharge   [facility] M[facility]-C   7 months ago Type 2 diabetes mellitus with obesity   [facility] Leavy Mole, PA-C       Future Appointments             In 4 months Leavy Mole, PA-C [facility], [place]

## 2023-10-22 ENCOUNTER — Other Ambulatory Visit (HOSPITAL_COMMUNITY): Payer: Self-pay

## 2023-11-06 ENCOUNTER — Telehealth: Payer: Self-pay | Admitting: Cardiology

## 2023-11-06 NOTE — Telephone Encounter (Signed)
 Patient called in with reports of heart beating fast that feels different than the regular flutters she has. These have been more consistent and last longer. Last week she had some steroid shots for her back and she thinks it could be related to too much caffeine as well. She denies lightheadedness but does endorse some shortness of breath accompanied with a cough during these episodes. Her blood pressure on the phone was 162/93 and she cannot remember if she took her blood pressure medication or not, she doesn't think she did this morning/afternoon yet. Her pulse was 60 bpm. She states she was going to go down to the her daughters house and get her to take her blood pressure because she has a better blood pressure cuff. Made appt for patient to be seen next week. Patient will call back with blood pressure results from daughters house.

## 2023-11-06 NOTE — Telephone Encounter (Signed)
 Patient c/o Palpitations:  STAT if patient reporting lightheadedness, shortness of breath, or chest pain  How long have you had palpitations/irregular HR/ Afib? Are you having the symptoms now? 2 wks, yes   Are you currently experiencing lightheadedness, SOB or CP? SOB yesterday, lightheadedness    Do you have a history of afib (atrial fibrillation) or irregular heart rhythm? yes  Have you checked your BP or HR? (document readings if available): no  Are you experiencing any other symptoms? No

## 2023-11-12 ENCOUNTER — Ambulatory Visit: Admitting: Cardiology

## 2024-01-09 ENCOUNTER — Other Ambulatory Visit: Payer: Self-pay | Admitting: Cardiology

## 2024-01-09 DIAGNOSIS — I10 Essential (primary) hypertension: Secondary | ICD-10-CM

## 2024-02-04 ENCOUNTER — Other Ambulatory Visit: Payer: Self-pay | Admitting: Cardiology

## 2024-03-10 ENCOUNTER — Ambulatory Visit: Admitting: Family Medicine

## 2024-04-10 ENCOUNTER — Encounter: Admitting: Internal Medicine
